# Patient Record
Sex: Male | Born: 1955 | Race: White | Hispanic: No | Smoking: Current every day smoker
Health system: Southern US, Community
[De-identification: ages and names within clinical notes are randomized; demographics above are authoritative.]

## PROBLEM LIST (undated history)

## (undated) DIAGNOSIS — Z973 Presence of spectacles and contact lenses: Secondary | ICD-10-CM

## (undated) DIAGNOSIS — I739 Peripheral vascular disease, unspecified: Secondary | ICD-10-CM

## (undated) DIAGNOSIS — R112 Nausea with vomiting, unspecified: Secondary | ICD-10-CM

## (undated) DIAGNOSIS — E785 Hyperlipidemia, unspecified: Secondary | ICD-10-CM

## (undated) DIAGNOSIS — I639 Cerebral infarction, unspecified: Secondary | ICD-10-CM

## (undated) DIAGNOSIS — J3089 Other allergic rhinitis: Secondary | ICD-10-CM

## (undated) DIAGNOSIS — I63239 Cerebral infarction due to unspecified occlusion or stenosis of unspecified carotid arteries: Secondary | ICD-10-CM

## (undated) DIAGNOSIS — N183 Chronic kidney disease, stage 3 unspecified: Secondary | ICD-10-CM

## (undated) DIAGNOSIS — F32A Depression, unspecified: Secondary | ICD-10-CM

## (undated) DIAGNOSIS — J410 Simple chronic bronchitis: Secondary | ICD-10-CM

## (undated) DIAGNOSIS — Z9889 Other specified postprocedural states: Secondary | ICD-10-CM

## (undated) DIAGNOSIS — N401 Enlarged prostate with lower urinary tract symptoms: Secondary | ICD-10-CM

## (undated) DIAGNOSIS — R972 Elevated prostate specific antigen [PSA]: Secondary | ICD-10-CM

## (undated) DIAGNOSIS — G629 Polyneuropathy, unspecified: Secondary | ICD-10-CM

## (undated) DIAGNOSIS — F419 Anxiety disorder, unspecified: Secondary | ICD-10-CM

## (undated) DIAGNOSIS — T4145XA Adverse effect of unspecified anesthetic, initial encounter: Secondary | ICD-10-CM

## (undated) DIAGNOSIS — Z87442 Personal history of urinary calculi: Secondary | ICD-10-CM

## (undated) DIAGNOSIS — M1990 Unspecified osteoarthritis, unspecified site: Secondary | ICD-10-CM

## (undated) DIAGNOSIS — N210 Calculus in bladder: Secondary | ICD-10-CM

## (undated) DIAGNOSIS — I10 Essential (primary) hypertension: Secondary | ICD-10-CM

## (undated) HISTORY — PX: MANDIBLE FRACTURE SURGERY: SHX706

## (undated) HISTORY — DX: Hyperlipidemia, unspecified: E78.5

## (undated) HISTORY — PX: CERVICAL FUSION: SHX112

## (undated) HISTORY — PX: COLONOSCOPY: SHX174

## (undated) HISTORY — PX: CYSTOSCOPY: SUR368

## (undated) HISTORY — PX: BACK SURGERY: SHX140

## (undated) HISTORY — PX: OTHER SURGICAL HISTORY: SHX169

## (undated) HISTORY — PX: COLONOSCOPY W/ POLYPECTOMY: SHX1380

---

## 1988-01-27 HISTORY — PX: KNEE ARTHROSCOPY: SUR90

## 1999-07-09 ENCOUNTER — Encounter: Payer: Self-pay | Admitting: Family Medicine

## 1999-07-09 ENCOUNTER — Encounter: Admission: RE | Admit: 1999-07-09 | Discharge: 1999-07-09 | Payer: Self-pay | Admitting: Oncology

## 1999-07-23 ENCOUNTER — Encounter (INDEPENDENT_AMBULATORY_CARE_PROVIDER_SITE_OTHER): Payer: Self-pay | Admitting: Specialist

## 1999-07-23 ENCOUNTER — Other Ambulatory Visit: Admission: RE | Admit: 1999-07-23 | Discharge: 1999-07-23 | Payer: Self-pay | Admitting: *Deleted

## 1999-08-04 ENCOUNTER — Ambulatory Visit (HOSPITAL_COMMUNITY): Admission: RE | Admit: 1999-08-04 | Discharge: 1999-08-04 | Payer: Self-pay | Admitting: Neurosurgery

## 1999-08-04 ENCOUNTER — Encounter: Payer: Self-pay | Admitting: Neurosurgery

## 1999-08-04 HISTORY — PX: LUMBAR LAMINECTOMY/DECOMPRESSION MICRODISCECTOMY: SHX5026

## 2001-03-04 ENCOUNTER — Encounter: Payer: Self-pay | Admitting: Emergency Medicine

## 2001-03-04 ENCOUNTER — Emergency Department (HOSPITAL_COMMUNITY): Admission: EM | Admit: 2001-03-04 | Discharge: 2001-03-04 | Payer: Self-pay | Admitting: *Deleted

## 2001-04-08 ENCOUNTER — Ambulatory Visit (HOSPITAL_BASED_OUTPATIENT_CLINIC_OR_DEPARTMENT_OTHER): Admission: RE | Admit: 2001-04-08 | Discharge: 2001-04-08 | Payer: Self-pay | Admitting: Urology

## 2001-04-08 HISTORY — PX: CYSTOSCOPY/URETEROSCOPY/HOLMIUM LASER/STENT PLACEMENT: SHX6546

## 2001-07-07 ENCOUNTER — Encounter: Admission: RE | Admit: 2001-07-07 | Discharge: 2001-07-07 | Payer: Self-pay | Admitting: *Deleted

## 2001-07-07 ENCOUNTER — Encounter: Payer: Self-pay | Admitting: Family Medicine

## 2001-07-08 ENCOUNTER — Ambulatory Visit (HOSPITAL_COMMUNITY): Admission: RE | Admit: 2001-07-08 | Discharge: 2001-07-08 | Payer: Self-pay | Admitting: Internal Medicine

## 2003-03-07 ENCOUNTER — Emergency Department (HOSPITAL_COMMUNITY): Admission: EM | Admit: 2003-03-07 | Discharge: 2003-03-07 | Payer: Self-pay | Admitting: Family Medicine

## 2003-06-04 ENCOUNTER — Emergency Department (HOSPITAL_COMMUNITY): Admission: EM | Admit: 2003-06-04 | Discharge: 2003-06-04 | Payer: Self-pay | Admitting: Family Medicine

## 2003-08-06 ENCOUNTER — Emergency Department (HOSPITAL_COMMUNITY): Admission: EM | Admit: 2003-08-06 | Discharge: 2003-08-06 | Payer: Self-pay | Admitting: Family Medicine

## 2003-08-07 ENCOUNTER — Ambulatory Visit (HOSPITAL_COMMUNITY): Admission: RE | Admit: 2003-08-07 | Discharge: 2003-08-07 | Payer: Self-pay | Admitting: Neurosurgery

## 2003-08-22 ENCOUNTER — Encounter: Admission: RE | Admit: 2003-08-22 | Discharge: 2003-08-22 | Payer: Self-pay | Admitting: Neurosurgery

## 2003-10-03 ENCOUNTER — Ambulatory Visit (HOSPITAL_COMMUNITY): Admission: RE | Admit: 2003-10-03 | Discharge: 2003-10-04 | Payer: Self-pay | Admitting: Neurosurgery

## 2003-10-23 HISTORY — PX: ANTERIOR CERVICAL DECOMP/DISCECTOMY FUSION: SHX1161

## 2003-11-14 ENCOUNTER — Encounter: Admission: RE | Admit: 2003-11-14 | Discharge: 2003-11-14 | Payer: Self-pay | Admitting: Neurosurgery

## 2003-12-14 ENCOUNTER — Emergency Department (HOSPITAL_COMMUNITY): Admission: EM | Admit: 2003-12-14 | Discharge: 2003-12-14 | Payer: Self-pay | Admitting: Family Medicine

## 2003-12-26 ENCOUNTER — Encounter: Admission: RE | Admit: 2003-12-26 | Discharge: 2003-12-26 | Payer: Self-pay | Admitting: Neurosurgery

## 2004-03-12 ENCOUNTER — Encounter: Admission: RE | Admit: 2004-03-12 | Discharge: 2004-03-12 | Payer: Self-pay | Admitting: Neurosurgery

## 2004-06-05 ENCOUNTER — Emergency Department (HOSPITAL_COMMUNITY): Admission: EM | Admit: 2004-06-05 | Discharge: 2004-06-05 | Payer: Self-pay | Admitting: Family Medicine

## 2004-07-08 ENCOUNTER — Emergency Department (HOSPITAL_COMMUNITY): Admission: EM | Admit: 2004-07-08 | Discharge: 2004-07-08 | Payer: Self-pay | Admitting: Family Medicine

## 2004-11-11 ENCOUNTER — Emergency Department (HOSPITAL_COMMUNITY): Admission: EM | Admit: 2004-11-11 | Discharge: 2004-11-11 | Payer: Self-pay | Admitting: Family Medicine

## 2005-06-23 ENCOUNTER — Emergency Department (HOSPITAL_COMMUNITY): Admission: EM | Admit: 2005-06-23 | Discharge: 2005-06-23 | Payer: Self-pay | Admitting: Family Medicine

## 2005-07-03 ENCOUNTER — Emergency Department (HOSPITAL_COMMUNITY): Admission: EM | Admit: 2005-07-03 | Discharge: 2005-07-03 | Payer: Self-pay | Admitting: Family Medicine

## 2005-10-09 ENCOUNTER — Emergency Department (HOSPITAL_COMMUNITY): Admission: EM | Admit: 2005-10-09 | Discharge: 2005-10-09 | Payer: Self-pay | Admitting: Family Medicine

## 2005-10-23 ENCOUNTER — Emergency Department (HOSPITAL_COMMUNITY): Admission: EM | Admit: 2005-10-23 | Discharge: 2005-10-23 | Payer: Self-pay | Admitting: Family Medicine

## 2006-03-24 ENCOUNTER — Emergency Department (HOSPITAL_COMMUNITY): Admission: EM | Admit: 2006-03-24 | Discharge: 2006-03-24 | Payer: Self-pay | Admitting: Family Medicine

## 2006-08-06 ENCOUNTER — Emergency Department (HOSPITAL_COMMUNITY): Admission: EM | Admit: 2006-08-06 | Discharge: 2006-08-06 | Payer: Self-pay | Admitting: Family Medicine

## 2007-01-27 DIAGNOSIS — I639 Cerebral infarction, unspecified: Secondary | ICD-10-CM

## 2007-01-27 HISTORY — DX: Cerebral infarction, unspecified: I63.9

## 2007-09-15 ENCOUNTER — Inpatient Hospital Stay (HOSPITAL_COMMUNITY): Admission: EM | Admit: 2007-09-15 | Discharge: 2007-09-17 | Payer: Self-pay | Admitting: Emergency Medicine

## 2007-09-15 DIAGNOSIS — E785 Hyperlipidemia, unspecified: Secondary | ICD-10-CM | POA: Insufficient documentation

## 2007-09-15 DIAGNOSIS — I6930 Unspecified sequelae of cerebral infarction: Secondary | ICD-10-CM

## 2007-09-15 DIAGNOSIS — I69959 Hemiplegia and hemiparesis following unspecified cerebrovascular disease affecting unspecified side: Secondary | ICD-10-CM | POA: Insufficient documentation

## 2007-09-15 HISTORY — DX: Unspecified sequelae of cerebral infarction: I69.30

## 2007-09-16 ENCOUNTER — Encounter (INDEPENDENT_AMBULATORY_CARE_PROVIDER_SITE_OTHER): Payer: Self-pay | Admitting: Emergency Medicine

## 2007-09-16 ENCOUNTER — Ambulatory Visit: Payer: Self-pay | Admitting: Internal Medicine

## 2007-09-17 ENCOUNTER — Encounter (INDEPENDENT_AMBULATORY_CARE_PROVIDER_SITE_OTHER): Payer: Self-pay | Admitting: Internal Medicine

## 2007-09-30 ENCOUNTER — Ambulatory Visit: Payer: Self-pay | Admitting: Internal Medicine

## 2007-09-30 DIAGNOSIS — F17200 Nicotine dependence, unspecified, uncomplicated: Secondary | ICD-10-CM | POA: Insufficient documentation

## 2007-09-30 DIAGNOSIS — K219 Gastro-esophageal reflux disease without esophagitis: Secondary | ICD-10-CM | POA: Insufficient documentation

## 2007-10-04 DIAGNOSIS — F411 Generalized anxiety disorder: Secondary | ICD-10-CM | POA: Insufficient documentation

## 2010-02-16 ENCOUNTER — Emergency Department (HOSPITAL_COMMUNITY)
Admission: EM | Admit: 2010-02-16 | Discharge: 2010-02-16 | Payer: Self-pay | Source: Home / Self Care | Admitting: Emergency Medicine

## 2010-02-18 LAB — URINALYSIS, ROUTINE W REFLEX MICROSCOPIC
Bilirubin Urine: NEGATIVE
Ketones, ur: 15 mg/dL — AB
Nitrite: NEGATIVE
Protein, ur: 100 mg/dL — AB
Specific Gravity, Urine: 1.015 (ref 1.005–1.030)
Urine Glucose, Fasting: NEGATIVE mg/dL
Urobilinogen, UA: 0.2 mg/dL (ref 0.0–1.0)
pH: 6.5 (ref 5.0–8.0)

## 2010-02-18 LAB — URINE MICROSCOPIC-ADD ON

## 2010-02-19 LAB — URINE CULTURE
Colony Count: >=100000
Culture  Setup Time: 201201221734

## 2010-06-10 NOTE — Discharge Summary (Signed)
NAMEHENSON, Roy Moore              ACCOUNT NO.:  [id number]   MEDICAL RECORD NO.:  [id number]          PATIENT TYPE:  INP   LOCATION:  3038                         FACILITY:  [facility]   PHYSICIAN:  Deanna Artis. Hickling, M.D.DATE OF BIRTH:  02/12/1955   DATE OF ADMISSION:  09/15/2007  DATE OF DISCHARGE:  09/17/2007                               DISCHARGE SUMMARY   FINAL DIAGNOSES:  1. 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 (434.11)  2. Remote left posterior cerebral artery infarction.   RISK FACTORS:  1. Bilateral carotid artery occlusions.  2. Tobacco abuse.  3. Elevated LDL, lower than normal HDL, elevated homocystine.   PROCEDURES:  1. CT scan of the brain.  2. MRI scan of the brain and carotid.  3. Cerebral angiogram.  4. A 2-D echocardiogram.   COMPLICATIONS:  None.   SUMMARY OF HOSPITALIZATION:  The patient is a 55 year old gentleman who  was admitted with sudden onset of left hemiparesis that was progressive  from a right facial droop to left-sided severe weakness, dysarthria, and  hemisensory.  NIH stroke scale was 12.  The patient was given two-thirds  tPA and sent to the Interventional Suite where he cleared quickly.  Findings at the angiogram showed an occluded right internal carotid  artery at the bulb with distal reconstitution of the cavernous segment  from the external carotid artery circulation, a small filling defect in  the right middle cerebral artery that is distal to the M1 segment, non-  flow restricting partial constitution via the right posterior cerebral  artery, occluded left internal carotid artery at the bulb with left  middle cerebral and anterior cerebral artery supplied from the left  posterior circulation.  Given that the patient clinically improved, the  remainder one-third dose of tPA was given.   His NIH stroke scale dropped to zero.   MRI of the brain showed  scattered watershed infarctions in the  distribution of right middle cerebral artery and an old left watershed  infarction in the watershed of the posterior cerebral artery and  posterior division of the middle cerebral artery.  MRA showed bilateral  occlusion of the internal carotid arteries at the skull base.   Cholesterol 160, total cholesterol 163, triglycerides 147, HDL low at  27, and LDL elevated at 107.  Serum homocystine was 19.2.  Hemoglobin  A1c was 5.7.  Chest x-ray:  No active disease.  Urinalysis:  11-20 white  blood cells, 0-2 red blood cells, and small leukocyte esterase.  The  patient does not have a dysuria and this was not treated.  Urine drug  screen positive for benzodiazepines and THC.   PHYSICAL EXAMINATION:  GENERAL:  Today, the patient feels well.  He has  no complaints.  VITAL SIGNS:  Blood pressure 119/78, resting pulse 80, respirations 18,  T-max 97.8, and oxygenation 98% on room air.  He is on a heart-healthy  diet and is out of bed.  LUNGS:  Clear.  HEART:  No murmurs.  Pulses normal.  ABDOMEN:  Soft.  Bowel  sounds normal.  EXTREMITIES:  Well-formed.  NEUROLOGIC:  Awake and alert without dysphagia.  Cranial Nerves:  Visual  fields full.  Extraocular movements full.  Symmetric facial strength,  midline tongue.  Motor examination:  Normal strength in all 4  extremities.  Normal sensation including primary and cortical.  His gait  is slightly broad-based but stable.  Deep tendon reflexes were  diminished.  The patient had bilateral flexor plantar responses.   The patient had a 2-D echocardiogram which shows normal left ventricular  systolic function and an ejection fraction of 60-65%, mild increased  thickness of left ventricular wall, and no other source of emboli.   The patient is discharged home in improved condition.  We discussed the  absolute need for cessation of smoking.  He has been given the Nicoderm  patch but since no one around him smokes, he  was advised to use his  family's resources to help him quit and to stay away from other smokers.  His major barrier to care right now is that he has no insurance and no  visible leads to support.  However, given that he smokes at least 1  packet of cigarettes per day and THC was found in his urine, it appears  that he is finding ways to afford other things that where he to change,  his parents would provide money for other medications.  The patient also  need a low dose statin and I have started him on simvastatin 20 mg  daily.  He needs Plavix 75 mg for 2 months and aspirin 81 mg that will  continue and perhaps increased to 325 and Plavix was discontinued.  Because of elevated homocystine, he needs to start Metanx.   In the hierarchy of treatments, I have told him that it is absolute  necessary to stop smoking, to take his Plavix, and to take simvastatin.  There is less support for the Metanx and the smoking cessation could be  accomplished without the Nicoderm patch.  I spent over half an hour  discussing on the patient's clinical situation, and the need for  secondary stroke prevention.  I filled out prescriptions for 3-day  supply of these medications and through the [facility] and for  31-day supply with 5 refills for them.  I also filled out a 3-day  prescription for alprazolam 0.5 mg t.i.d. #9 and for amitriptyline 50 mg  #3, also no refills.  He will obtain these other two medications through  his primary physician, Dr. Milus Glazier at the beginning of the week.  He  should followup with Dr. Pearlean Brownie in 2-3 months' time.  He may be a  candidate for the IVUS study.  I will have the staff contact him next  week.      Deanna Artis. Sharene Skeans, M.D.  Electronically Signed     WHH/MEDQ  D:  09/17/2007  T:  09/18/2007  Job:  [id number]   cc:   Elvina Sidle, M.D.

## 2010-06-10 NOTE — H&P (Signed)
Roy Moore, Roy Moore              ACCOUNT NO.:  [id number]   MEDICAL RECORD NO.:  [id number]          PATIENT TYPE:  INP   LOCATION:  3110                         FACILITY:  [facility]   PHYSICIAN:  Gustavus Messing. Orlin Hilding, M.D.DATE OF BIRTH:  1955/03/09   DATE OF ADMISSION:  09/15/2007  DATE OF DISCHARGE:                              HISTORY & PHYSICAL   PRIMARY CARE PHYSICIAN:  Elvina Sidle, MD   Onset of symptoms was 12:50 p.m., Neurology was called at 1:25 p.m., and  initial neurologic exam at 2 p.m.  CT results known at about 2:10 p.m.   CHIEF COMPLAINT:  Left-sided weakness, confusion, and vision loss.   HISTORY OF PRESENT ILLNESS:  Roy Moore is a 55 year old right-handed  white male with a history of remote alcohol abuse and seizures in the  past 12 years ago or more, otherwise been healthy.  He had been working  outside, came inside, sat down, talking to his wife was normal, but then  just in front of his wife at about 12:50 p.m., he became dysarthric and  was acting confused, had a right facial droop.  She called up 911 and he  was transported as a potential code stroke.   REVIEW OF SYSTEMS:  Really not obtainable from the patient at this time  as he is confused.   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  He does not have any clinical history of stroke, but his CT that is done  today does show that he has had an old left occipital stroke.   CURRENT MEDICATIONS:  1. Amitriptyline nightly.  2. Xanax 0.5 mg nightly.  I do not know the dose of the amitriptyline.   ALLERGIES:  No known drug allergies.   SOCIAL HISTORY:  He is married, has one son, works odd jobs apparently.  He does use tobacco.  He has a remote history of alcohol abuse, no  illicit drugs known.   FAMILY HISTORY:  Noncontributory.   OBJECTIVE:  On exam, blood pressure is 172/97.  He was somewhat  tachycardiac in the low 100s, 87.5 kg.  He has poor dentition, otherwise  head is normocephalic and atraumatic.  He was alert.  He did answer 2/2  questions correctly and followed 2/2 commands correctly.  He had full  gaze, although it was difficult to get him to look to the left, but was  able to get excursions across.  He had a dense left homonymous  hemianopsia on visual field testing.  He definitely had a left facial  droop.  He had a level 4 drift on the left with no movement of left  upper extremity, no drift on the right upper extremity.  He had a level  1 drift in the left lower extremity, no drift in the right lower  extremity.  There was no ataxia demonstrated.  He had absolute loss of  sensation on the left side to pinprick.  He did not appear to have  aphasia, but did have dysarthria.  There was significant  neglect of the  left side, although because of the dense homonymous hemianopsia and  dense sensory loss, could not demonstrate it in that fashion, but he  definitely would not attend well to the left.  NIH score was 13. There  was some fluctuating weakness in the left upper extremity.  CT of the  brain did not show anything definitely acute, but there was a question  of a hyperdense MCA sign on the right.  I also questioned some  effacement of the sulci in the right occipital region, which might  indicate early changes of stroke.   INR was 0.9, platelets are 259.  CBG was in the 130 range.  No  contraindications to TPA.   ASSESSMENT:  He appears to have an acute right middle cerebral artery  and/or posterior cerebral artery infarct in progress with signs of  cortical involvement in a large vessel distribution.  He arrived within  1-1/2 hours of the onset.   PLAN:  We will give two-thirds IV TPA and bring to angio to see if there  is anything amenable to clot extraction. He will then be admitted to the  ICU for stroke work up and further treatment as needed.       Catherine A. Orlin Hilding, M.D.  Electronically Signed     CAW/MEDQ  D:  09/15/2007  T:  09/16/2007  Job:  [id number]

## 2010-06-13 NOTE — Op Note (Signed)
[facility]  Patient:    Roy Moore, Roy Moore Visit Number: [id number] MRN: [id number]          Service Type: EMS Location: [facility] Attending Physician:  Hanley Seamen Dictated by:   Bertram Millard. Dahlstedt, M.D. Proc. Date: 04/08/01 Admit Date:  03/04/2001 Discharge Date: 03/04/2001   CC:         Elvina Sidle, M.D.   Operative Report  PREOPERATIVE DIAGNOSIS:  Right distal ureteral calculus with left midureteral calculus.  POSTOPERATIVE DIAGNOSES: 1. No evidence of right ureteral calculus. 2. Moderate-sized left ureteral calculus.  PROCEDURES: 1. Cystoscopy. 2. Bilateral retrograde ureteropyelograms. 3. Right ureteroscopy. 4. Left ureteroscopy with holmium laser lithotripsy of ureteral stone. 5. Double J stent placement.  SURGEON:  Bertram Millard. Dahlstedt, M.D.  ANESTHESIA:  General LMA.  COMPLICATIONS:  None.  BRIEF HISTORY:  A 55 year old male who initially presented to my office March 04, 2001.  He had bilateral hydronephrosis diagnosed in the emergency room at [facility].  He was found to have a distal ureteral calculus that was of acute onset.  He had a mid-left ureteral calculus.  He had left greater than right hydronephrosis.  The patient had an IVP on March 15, 2001, which revealed a distal ureteral stone with minimal hydronephrosis.  There was a delayed left pyelogram with hydronephrosis and no obvious stone seen, but the patient did not come in for follow-up.  It was recommended that he undergo bilateral retrograde pyelograms, possible right stone extraction, possible left ureteroscopy and holmium laser lithotripsy.  Risks and complications of the procedure were discussed with the patient and his wife.  These included but were not limited to infection, perforation of ureter, bleeding, anesthetic complications.  They understand these and desire to proceed.  DESCRIPTION OF PROCEDURE:  The patient was administered a general  anesthetic using the LMA.  He was placed in the dorsal lithotomy position.  Genitalia and perineum were prepped and draped.  A 25 French panendoscope was placed in the patients bladder.  The bladder and prostate were found to be normal. Bilateral retrograde ureteropyelograms were performed.  On the right, there was no evidence of obstruction or filling defects.  On the left, there was a midureteral filling defect with proximal hydronephrosis.  The 6 French short ureteroscope was advanced through the urethra and into the right ureteral orifice.  The ureter was found to be normal without evident stone.  On the left, the distal ureter was normal.  Midway just above the pelvic brim, the stone was encountered.  A guidewire was advanced past the stone.  The ureteroscope was then advanced into the ureter and the 300 micron holmium laser fiber was used to shatter the stone at a power of 5 watts.  Excellent fragmentation was seen.  The ureter was inspected proximal to the previously-mentinoed stone.  No further stones were seen in the proximal ureter or in the renal pelvis.  Inspection of the site of the stone revealed some narrowing of the ureter with some bullous mucosa; however, no other stones were seen.  No evidence of perforation was noted.  The ureteroscope was removed.  A 6 French x 26 cm Lubriflex double J stent was advanced over the guidewire, and fluoroscopic and cystoscopic guidance showed it to be in adequate position.  At this point the bladder was drained.  A string was left on the end of the stent, brought through the end of the penis, and taped.  The scope was removed, and the patients  procedure was terminated.  He tolerated the procedure well.  He was awakened and taken to the PACU in stable condition. Dictated by:   Bertram Millard. Dahlstedt, M.D. Attending Physician:  Hanley Seamen DD:  04/08/01 TD:  04/11/01 Job: [id number]

## 2010-06-13 NOTE — Op Note (Signed)
[place]  Patient:    Roy Moore, Roy Moore                       MRN: [id number] Proc. Date: 08/04/99 Attending:  Julio Sicks, M.D.                           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  PROCEDURE:  Left L3-4 laminotomy and microdiskectomy.  SURGEON:  Julio Sicks, M.D.  ASSISTANT:  Donzetta Sprung. Roney Jaffe., M.D.  ANESTHESIA:  General endotracheal.  INDICATIONS:  Mr. Roy Moore is a 55 year old male with a history of back and severe left lower extremity pain.  There is some weakness with a left-sided L3 versus L4 radiculopathy.  The patient has failed conservative measures.  MRI scan demonstrates a left L3-4 disk herniation with a superior fragment causing encroachment at the left L3 and L4 nerve roots.  The patient also has a broad-based disk herniation at L4-5, which appears to be asymptomatic.  The patient has been counseled as to his options.  He has decided to proceed with a left-sided L3-4 laminotomy and microdiskectomy for hopeful improvement of his symptoms.  DESCRIPTION OF PROCEDURE:  The patient was taken to the operating room and placed on the table in the supine position.  After an adequate level of anesthesia achieved, patient positioned prone onto the Wilson frame, appropriately padded for operation of the lumbar region, shaved, prepped and draped sterilely.  A 10 blade used to make a linear incision over the L3-4 interspace.  This was carried down sharply in the midline.  A subperiosteal dissection was performed on the left side, exposing the laminae and facets at L3 and L4.  Deep self-retaining retractor was placed, x-ray was taken, level was confirmed.  A laminotomy was then performed using Kerrison rongeurs and the high-speed drill, _____ of the inferior one-half of the lamina of L3,  the medial one-third of the L3-4 facet joint, and superior rim of the L4 lamina. Ligamentum flavum was then elevated and resected in piecemeal fashion using Kerrison rongeurs, freeing up the thecal sac and exiting L4 nerve root, which was identified.  The microscope was brought into the field and used throughout the remainder of the diskectomy.  Microscope was used for microdissection of the thecal sac and underlying disk herniation.  After the venous plexus was coagulated and cut, thecal sac and L4 nerve root were mobilized and tracked toward the midline.  A disk herniation at the level of the disk space was encountered.  This was then incised with a 15 blade in rectangular fashion.  A wide disk space clear-out was then achieved using pituitary rongeurs, upward-angled pituitary rongeurs, and Epstein curettes.  After an aggressive diskectomy performed, the remainder of the canal was explored.  There was a moderately large superior free fragment coursing beneath the exiting left-sided L3 nerve root.  This was dissected free and resected completely. At this point, there was no evidence of any further compression of the thecal sac or nerve roots.  There was no evidence of injury to the thecal sac or nerve roots.  The wound was then copiously irrigated with antibiotic solution. Gelfoam was placed on top for hemostasis, which was found to be good. Microscope and retraction system were removed.  Hemostasis of the musculature was achieved with the  electrocautery.  The wound was then closed in layers with Vicryl sutures.  Staples were applied to the surface.  There were no apparent complications.  The patient tolerated the procedure well, and he returned to the recovery room postoperatively. DD:  08/04/99 TD:  08/04/99 Job: [id number]

## 2010-06-13 NOTE — Op Note (Signed)
NAME:  Roy Moore                        ACCOUNT NO.:  [id number]   MEDICAL RECORD NO.:  [id number]                   PATIENT TYPE:  OIB   LOCATION:  3172                                 FACILITY:  [facility]   PHYSICIAN:  Kathaleen Maser. Moore, M.D.                 DATE OF BIRTH:  03/02/1955   DATE OF PROCEDURE:  10/03/2003  DATE OF DISCHARGE:                                 OPERATIVE REPORT   HISTORY:  Mr. Roy Moore is a 55 year old male who was injured in a work related  accident which resulted in neck and back pain.  The patient has radiation to  his right upper extremity and right lower extremity.  Work-up has  demonstrated evidence of a very significant right-sided C5-6 disc herniation  first in the right side of C6 nerve root.  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  We discussed the options of  operative management including the possibility of undergoing a C3-4, C4-5  and C5-6 anterior cervical diskectomy and fusion, allograft anterior plating  for improvement of symptoms.  We discussed the risks and benefits involving  surgery including, but not limited to risks of anesthesia, bleeding,  infection, CSF leak, nerve root injury spinal cord injury, fusion failure,  continued pain and no benefit.  The patient has been given option to ask  questions and appears to understand.  He wishes to proceed with surgery.   DESCRIPTION OF PROCEDURE:  The patient taken to the operating room and  placed on the table in supine position.  After adequate level of anesthesia  achieved, the patient positioned supine with neck slightly extended and held  by traction.  The anterior cervical region was prepped and draped sterilely.  We made a linear skin incision overlying the C4-5 level.  This carried down  sharply to the platysma.  The platysma was then divided vertically and  dissection  proceeded on the medial border of the cerebral vessel and carotid  sheath.  Trachea and esophagus immobilized and retracted toward the left.  Prevertebral  fascia stripped off the anterior spinal column.  The longus  colli muscles elevated bilaterally using electrocautery.  Deep self  retractors were placed.  Intraoperative fluoroscopy was used and the C3-4,  C4-5 and C5-6 levels were confirmed.  Discectomy was then performed at all  three levels utilizing pituitary rongeurs, Kerrison rongeurs, Karlin  curettes and the high speed drill.  All elements of the discs were removed  antral to the posterior annulus.  Starting first at C3-4 microscope was  brought into the field and used throughout the remainder of discectomy and  remaining aspects of the annulus.  Osteophytes were removed down to the  level of the posterior longitudinal ligament  using the high speed drill.  The posterior longitudinal ligament was then elevated and dissected  using  Kerrison rongeurs for wide central decompression then performed by  undercutting the bodies of C3 and C4 using Kerrison rongeurs.  Decompression  then presented at each neural foramen and wide anterior foraminotomy was  performed along the course of exiting L4 nerve roots.  At this point, a very  thorough decompression had been achieved.  The C4-5 and C5-6 were then  approached in similar manner.  Findings were similar.  At the C4-5 there was  a moderately large central disc herniation and on the right at C5-6, there  was a rightward foraminal disc herniation causing marked right-sided C6  nerve root compression.  After an aggression had been achieved at altering  levels, wound was then irrigated with antibiotic solution.  Gelfoam was  placed topically for hemostasis.  A 7 mm patellar wedge Allograft was then  packed into place at the C3-4, C4-5 and C5-6 levels.  A 60 mm anterior  cervical plate was then placed over the C3, C4, C5 and C6 levels.  This  then  attached under fluoroscopic guidance using 14 mm __________ screws two each  at all four levels.  Final tightening was achieved at all levels.  There was  good purchase of bone.  Locking screws were engaged at all four levels.  Wounds then irrigated with antibiotic solution.  Hemostasis achieved with  bipolar electrocautery.  Final images revealed good  incision of the bone grafts hardware, proper adequate level of normalized  spine.  The wound is then closed in typical fashion.  Steri-Strips and  sterile dressing were applied. There were no apparent complications.  The  patient tolerated the procedure well and he returned to the recovery room  postoperatively.                                               Roy Moore, M.D.    HAP/MEDQ  D:  10/03/2003  T:  10/03/2003  Job:  [id number]

## 2011-03-19 ENCOUNTER — Other Ambulatory Visit: Payer: Self-pay | Admitting: Family Medicine

## 2011-03-23 ENCOUNTER — Telehealth: Payer: Self-pay

## 2011-03-24 ENCOUNTER — Encounter: Payer: Self-pay | Admitting: Family Medicine

## 2011-03-24 ENCOUNTER — Ambulatory Visit (INDEPENDENT_AMBULATORY_CARE_PROVIDER_SITE_OTHER): Payer: Medicare Other | Admitting: Family Medicine

## 2011-03-24 DIAGNOSIS — G4700 Insomnia, unspecified: Secondary | ICD-10-CM

## 2011-03-24 DIAGNOSIS — I69359 Hemiplegia and hemiparesis following cerebral infarction affecting unspecified side: Secondary | ICD-10-CM

## 2011-03-24 DIAGNOSIS — E785 Hyperlipidemia, unspecified: Secondary | ICD-10-CM

## 2011-03-24 DIAGNOSIS — F419 Anxiety disorder, unspecified: Secondary | ICD-10-CM

## 2011-03-24 DIAGNOSIS — J329 Chronic sinusitis, unspecified: Secondary | ICD-10-CM

## 2011-03-24 DIAGNOSIS — F411 Generalized anxiety disorder: Secondary | ICD-10-CM

## 2011-03-24 DIAGNOSIS — B079 Viral wart, unspecified: Secondary | ICD-10-CM

## 2011-03-24 DIAGNOSIS — E789 Disorder of lipoprotein metabolism, unspecified: Secondary | ICD-10-CM

## 2011-03-24 MED ORDER — AMITRIPTYLINE HCL 50 MG PO TABS: 50.0000 mg | ORAL_TABLET | Freq: Every day | ORAL | Status: DC

## 2011-03-24 MED ORDER — ALPRAZOLAM 1 MG PO TABS: 1.0000 mg | ORAL_TABLET | Freq: Three times a day (TID) | ORAL | Status: DC | PRN

## 2011-03-24 MED ORDER — LOVASTATIN 20 MG PO TABS: 20.0000 mg | ORAL_TABLET | Freq: Every day | ORAL | Status: DC

## 2011-03-24 MED ORDER — AMOXICILLIN 875 MG PO TABS: 875.0000 mg | ORAL_TABLET | Freq: Two times a day (BID) | ORAL | Status: AC

## 2011-03-24 NOTE — Progress Notes (Signed)
55-year-old disabled man from stroke several years ago comes in for refills on his medication, sinus congestion, painful area right heel. I debrided the right heel once before but the area in question has come back. He's had several weeks of sinus congestion and ear pain.  His long history of anxiety treated with Xanax successfully. Also he has chronic cholesterol problems which has been treated well with Mevacor and his cholesterol was last checked last August. Finally he wants a handicap sticker filled out today.  Objective: No acute distress alert and cooperative  HEENT: Serous otitis changes both ears, nose shows bilateral nasal swelling and redness, oropharynx shows terrible dentition with almost no teeth. (Patient choked on food recently and his wife did Heimlich maneuver)  Chest: Expiratory wheezes bilaterally  Heart: Regular no murmur  Skin: Right heel shows 3 mm wartlike structure which was debrided for 10 minutes and then treated with liquid nitrogen  Neuro: No facial asymmetry cranial nerves III through XII intact, right hand grip is improving with good grip which is only mildly less than left, gait normal  Assessment: Wart, sinusitis, anxiety, hyperlipidemia, status post CVA  Plan: Refill meds, recheck he'll when necessary, amoxicillin, handicap sticker filled out  

## 2011-10-06 ENCOUNTER — Other Ambulatory Visit: Payer: Self-pay | Admitting: Family Medicine

## 2011-10-07 ENCOUNTER — Other Ambulatory Visit: Payer: Self-pay | Admitting: *Deleted

## 2011-11-18 ENCOUNTER — Ambulatory Visit (INDEPENDENT_AMBULATORY_CARE_PROVIDER_SITE_OTHER): Payer: Medicare Other | Admitting: Family Medicine

## 2011-11-18 VITALS — BP 158/72 | HR 82 | Temp 98.7°F | Resp 16 | Ht 69.0 in | Wt 172.4 lb

## 2011-11-18 DIAGNOSIS — G4700 Insomnia, unspecified: Secondary | ICD-10-CM

## 2011-11-18 DIAGNOSIS — F419 Anxiety disorder, unspecified: Secondary | ICD-10-CM

## 2011-11-18 DIAGNOSIS — F411 Generalized anxiety disorder: Secondary | ICD-10-CM

## 2011-11-18 DIAGNOSIS — E785 Hyperlipidemia, unspecified: Secondary | ICD-10-CM

## 2011-11-18 DIAGNOSIS — J329 Chronic sinusitis, unspecified: Secondary | ICD-10-CM

## 2011-11-18 MED ORDER — AMITRIPTYLINE HCL 50 MG PO TABS: 50.0000 mg | ORAL_TABLET | Freq: Every day | ORAL | Status: DC

## 2011-11-18 MED ORDER — AMOXICILLIN 875 MG PO TABS: 875.0000 mg | ORAL_TABLET | Freq: Two times a day (BID) | ORAL | Status: DC

## 2011-11-18 MED ORDER — ALPRAZOLAM 1 MG PO TABS: 1.0000 mg | ORAL_TABLET | Freq: Three times a day (TID) | ORAL | Status: DC | PRN

## 2011-11-18 MED ORDER — LOVASTATIN 20 MG PO TABS: 20.0000 mg | ORAL_TABLET | Freq: Every day | ORAL | Status: DC

## 2011-11-18 NOTE — Addendum Note (Signed)
Addended by: Elvina Sidle on: 11/18/2011 06:42 PM   Modules accepted: Orders

## 2011-11-18 NOTE — Progress Notes (Signed)
56 year old disabled man from stroke several years ago comes in for refills on his medication, sinus congestion, painful area right heel. I debrided the right heel once before but the area in question has come back. He's had several weeks of sinus congestion and ear pain.  His long history of anxiety treated with Xanax successfully. Also he has chronic cholesterol problems which has been treated well with Mevacor and his cholesterol was last checked last August. Finally he wants a handicap sticker filled out today.  Objective: No acute distress alert and cooperative  HEENT: Serous otitis changes both ears, nose shows bilateral nasal swelling and redness, oropharynx shows terrible dentition with almost no teeth. (Patient choked on food recently and his wife did Heimlich maneuver)  Chest: Expiratory wheezes bilaterally  Heart: Regular no murmur  Skin: Right heel shows 3 mm wartlike structure which was debrided for 10 minutes and then treated with liquid nitrogen  Neuro: No facial asymmetry cranial nerves III through XII intact, right hand grip is improving with good grip which is only mildly less than left, gait normal  Assessment: Wart, sinusitis, anxiety, hyperlipidemia, status post CVA  Plan: Refill meds, recheck he'll when necessary, amoxicillin, handicap sticker filled out

## 2011-11-18 NOTE — Patient Instructions (Addendum)

## 2011-11-19 LAB — LIPID PANEL
Cholesterol: 175 mg/dL (ref 0–200)
HDL: 41 mg/dL (ref >39)
LDL Cholesterol: 109 mg/dL — ABNORMAL HIGH (ref 0–99)
Total CHOL/HDL Ratio: 4.3 Ratio
Triglycerides: 127 mg/dL (ref <150)
VLDL: 25 mg/dL (ref 0–40)

## 2011-11-19 LAB — COMPREHENSIVE METABOLIC PANEL
ALT: <8 U/L (ref 0–53)
AST: 14 U/L (ref 0–37)
Albumin: 4.5 g/dL (ref 3.5–5.2)
Alkaline Phosphatase: 47 U/L (ref 39–117)
BUN: 17 mg/dL (ref 6–23)
CO2: 29 mEq/L (ref 19–32)
Calcium: 9.4 mg/dL (ref 8.4–10.5)
Chloride: 106 mEq/L (ref 96–112)
Creat: 1.45 mg/dL — ABNORMAL HIGH (ref 0.50–1.35)
Glucose, Bld: 88 mg/dL (ref 70–99)
Potassium: 4 mEq/L (ref 3.5–5.3)
Sodium: 142 mEq/L (ref 135–145)
Total Bilirubin: 0.4 mg/dL (ref 0.3–1.2)
Total Protein: 6.9 g/dL (ref 6.0–8.3)

## 2011-11-23 ENCOUNTER — Telehealth: Payer: Self-pay

## 2011-11-23 NOTE — Telephone Encounter (Signed)
Our records indicate that it says to take Xanax 1 tab tid. Perhaps if it is incorrect they should contact the pharmacy.

## 2011-11-23 NOTE — Telephone Encounter (Signed)
Patent's wife called stating that directions for amitriptyline and xanax have been reversed. States on xanax to only take 1/day and that is normally the directions for the amitriptyline.

## 2011-11-24 NOTE — Telephone Encounter (Signed)
Called patients wife to advise, she is going to contact the pharmacy.

## 2011-11-26 ENCOUNTER — Telehealth: Payer: Self-pay | Admitting: Radiology

## 2011-11-26 DIAGNOSIS — F419 Anxiety disorder, unspecified: Secondary | ICD-10-CM

## 2011-11-26 MED ORDER — ALPRAZOLAM 1 MG PO TABS: 1.0000 mg | ORAL_TABLET | Freq: Three times a day (TID) | ORAL | Status: DC | PRN

## 2011-11-26 NOTE — Telephone Encounter (Signed)
Patients wife called to advise the Rx was incorrect, the Rx should state tid, but states tid for sleep, have corrected this and called pharmacy. Have just advised tid, took the sleep out of sig/ spoke to wife June,  [phone] called

## 2011-11-26 NOTE — Telephone Encounter (Signed)
Ok to refill xanax x 5 months

## 2011-11-26 NOTE — Telephone Encounter (Signed)
Called patients wife again and she is advised., she does request the Alprazolam to be renewed for 6 months, there was 1 renewal on this, she wants you to change to renewals to 5. Please advise.

## 2011-11-27 MED ORDER — ALPRAZOLAM 1 MG PO TABS: 1.0000 mg | ORAL_TABLET | Freq: Three times a day (TID) | ORAL | Status: DC | PRN

## 2011-11-27 NOTE — Telephone Encounter (Signed)
Thanks, have done this, called pharmacy, called pt to advise.

## 2012-05-10 ENCOUNTER — Telehealth: Payer: Self-pay

## 2012-05-10 DIAGNOSIS — F419 Anxiety disorder, unspecified: Secondary | ICD-10-CM

## 2012-05-10 NOTE — Telephone Encounter (Signed)
Pharm requesting RF of xanax 1 mg. Dr L, do you want to RF x 1 w/note that pt needs OV for add'l RFs, or do you want to deny this d/t pt being due for OV?

## 2012-05-12 MED ORDER — ALPRAZOLAM 1 MG PO TABS: 1.0000 mg | ORAL_TABLET | Freq: Three times a day (TID) | ORAL | Status: DC | PRN

## 2012-05-12 NOTE — Telephone Encounter (Signed)
Called patient to advise this is sent in office visit needed for further.

## 2012-05-12 NOTE — Telephone Encounter (Signed)
Call in one refill.  Please have patient return in next month.  No further refills after this without OV.

## 2012-06-01 ENCOUNTER — Ambulatory Visit (INDEPENDENT_AMBULATORY_CARE_PROVIDER_SITE_OTHER): Payer: Medicare Other | Admitting: Family Medicine

## 2012-06-01 VITALS — BP 122/64 | HR 91 | Temp 98.2°F | Resp 16 | Ht 69.0 in | Wt 175.0 lb

## 2012-06-01 DIAGNOSIS — E785 Hyperlipidemia, unspecified: Secondary | ICD-10-CM

## 2012-06-01 DIAGNOSIS — F419 Anxiety disorder, unspecified: Secondary | ICD-10-CM

## 2012-06-01 DIAGNOSIS — G4700 Insomnia, unspecified: Secondary | ICD-10-CM

## 2012-06-01 DIAGNOSIS — J329 Chronic sinusitis, unspecified: Secondary | ICD-10-CM

## 2012-06-01 LAB — COMPREHENSIVE METABOLIC PANEL
ALT: 13 U/L (ref 0–53)
AST: 19 U/L (ref 0–37)
Albumin: 4.7 g/dL (ref 3.5–5.2)
Alkaline Phosphatase: 45 U/L (ref 39–117)
BUN: 17 mg/dL (ref 6–23)
CO2: 29 mEq/L (ref 19–32)
Calcium: 9.4 mg/dL (ref 8.4–10.5)
Chloride: 103 mEq/L (ref 96–112)
Creat: 1.06 mg/dL (ref 0.50–1.35)
Glucose, Bld: 75 mg/dL (ref 70–99)
Potassium: 3.9 mEq/L (ref 3.5–5.3)
Sodium: 140 mEq/L (ref 135–145)
Total Bilirubin: 0.5 mg/dL (ref 0.3–1.2)
Total Protein: 7 g/dL (ref 6.0–8.3)

## 2012-06-01 LAB — LIPID PANEL
Cholesterol: 169 mg/dL (ref 0–200)
HDL: 48 mg/dL (ref >39)
LDL Cholesterol: 105 mg/dL — ABNORMAL HIGH (ref 0–99)
Total CHOL/HDL Ratio: 3.5 Ratio
Triglycerides: 80 mg/dL (ref <150)
VLDL: 16 mg/dL (ref 0–40)

## 2012-06-01 MED ORDER — LOVASTATIN 20 MG PO TABS: 20.0000 mg | ORAL_TABLET | Freq: Every day | ORAL | Status: DC

## 2012-06-01 MED ORDER — AMITRIPTYLINE HCL 50 MG PO TABS: 50.0000 mg | ORAL_TABLET | Freq: Every day | ORAL | Status: DC

## 2012-06-01 MED ORDER — AMOXICILLIN 875 MG PO TABS: 875.0000 mg | ORAL_TABLET | Freq: Two times a day (BID) | ORAL | Status: DC

## 2012-06-01 MED ORDER — ALPRAZOLAM 1 MG PO TABS: 1.0000 mg | ORAL_TABLET | Freq: Three times a day (TID) | ORAL | Status: DC | PRN

## 2012-06-01 NOTE — Progress Notes (Signed)
57 yo man with recurrent sinus problems, mild low back pain, small subcutaneous nodule, and refill on alprazolam  He still has sensation of water behind right ear   Objective: NAD Skin:  Small 3 mm epidermal nodule lower right parasternal freely mobile, nontender sebaceous type cyst Neck:  Right carotid bruit, good carotid pulses bilaterally HEENT: bilateral SOM, terrible dentition last few teeth Chest:  Clear Heart:  Reg, I/VI systolic murmur Ext: no edema  Assessment:  Chronic sinusitis related to poor dentition (urged to see dentist),  Continued smoking (urged to quit), poor dentition, epidermal cyst, chronic anxiety  Plan:

## 2012-06-01 NOTE — Patient Instructions (Addendum)

## 2012-12-30 ENCOUNTER — Other Ambulatory Visit: Payer: Self-pay | Admitting: Family Medicine

## 2013-01-02 ENCOUNTER — Telehealth: Payer: Self-pay | Admitting: Radiology

## 2013-01-02 NOTE — Telephone Encounter (Signed)
Patient requesting refill on Alprazolam 1mg ./ this is already done.

## 2013-01-03 ENCOUNTER — Telehealth: Payer: Self-pay

## 2013-01-07 ENCOUNTER — Other Ambulatory Visit: Payer: Self-pay | Admitting: Family Medicine

## 2013-01-07 NOTE — Telephone Encounter (Signed)
Pt states his pharmacy still does not have his rx for xanax. Please advise

## 2013-01-09 NOTE — Telephone Encounter (Signed)
Called in for him, called him to advise, it appears to have been sent in

## 2013-02-14 ENCOUNTER — Ambulatory Visit (INDEPENDENT_AMBULATORY_CARE_PROVIDER_SITE_OTHER): Payer: Medicare Other | Admitting: Family Medicine

## 2013-02-14 VITALS — BP 120/72 | HR 68 | Temp 97.9°F | Resp 16 | Ht 71.0 in | Wt 189.2 lb

## 2013-02-14 DIAGNOSIS — G4700 Insomnia, unspecified: Secondary | ICD-10-CM

## 2013-02-14 DIAGNOSIS — E785 Hyperlipidemia, unspecified: Secondary | ICD-10-CM

## 2013-02-14 DIAGNOSIS — B070 Plantar wart: Secondary | ICD-10-CM

## 2013-02-14 DIAGNOSIS — J329 Chronic sinusitis, unspecified: Secondary | ICD-10-CM

## 2013-02-14 MED ORDER — AMITRIPTYLINE HCL 50 MG PO TABS: 50.0000 mg | ORAL_TABLET | Freq: Every day | ORAL | Status: DC

## 2013-02-14 MED ORDER — ALPRAZOLAM 1 MG PO TABS: 1.0000 mg | ORAL_TABLET | Freq: Three times a day (TID) | ORAL | Status: DC | PRN

## 2013-02-14 MED ORDER — LOVASTATIN 20 MG PO TABS: 20.0000 mg | ORAL_TABLET | Freq: Every day | ORAL | Status: DC

## 2013-02-14 MED ORDER — AMOXICILLIN 875 MG PO TABS: 875.0000 mg | ORAL_TABLET | Freq: Two times a day (BID) | ORAL | Status: DC

## 2013-02-14 NOTE — Progress Notes (Signed)
Patient ID: Roy Moore MRN: [id number], DOB: 11/07/1955, 58 y.o. Date of Encounter: 02/14/2013, 6:02 PM  Primary Physician: [name], MD  Chief Complaint:  Chief Complaint  Patient presents with  . Facial Pain    ear pain, pt. feels fluid  . Plantar Warts    rt. foot  . Medication Refill    HPI: 58 y.o. year old male presents with 14 day history of nasal congestion, post nasal drip, sore throat, sinus pressure, and cough. Afebrile. No chills. Nasal congestion thick and green/yellow. Sinus pressure is the worst symptom. Cough is productive secondary to post nasal drip and not associated with time of day. Ears feel full, leading to sensation of muffled hearing. Has tried OTC cold preps without success. No GI complaints. Appetite too good  No recent antibiotics, recent travels, or sick contacts   No leg trauma, sedentary periods, h/o cancer, or tobacco use.  No past medical history on file.   Home Meds: Prior to Admission medications   Medication Sig Start Date End Date Taking? Authorizing Provider  ALPRAZolam [name]) 1 MG tablet Take 1 tablet (1 mg total) by mouth 3 (three) times daily as needed for anxiety. 02/14/13  Yes [name], MD  amitriptyline (ELAVIL) 50 MG tablet Take 1 tablet (50 mg total) by mouth at bedtime. 02/14/13  Yes [name], MD  lovastatin (MEVACOR) 20 MG tablet Take 1 tablet (20 mg total) by mouth at bedtime. 06/01/12  Yes [name], MD  amoxicillin (AMOXIL) 875 MG tablet Take 1 tablet (875 mg total) by mouth 2 (two) times daily. 02/14/13   [name], MD    Allergies: No Known Allergies  History   Social History  . Marital Status: Married    Spouse Name: [name]    Number of Children: [name]  . Years of Education: [name]   Occupational History  . Not on file.   Social History Main Topics  . Smoking status: Current Every Day Smoker  . Smokeless tobacco: Not on file  . Alcohol Use: Not on file  . Drug Use: Not on file  .  Sexual Activity: Not on file   Other Topics Concern  . Not on file   Social History Narrative  . No narrative on file     Review of Systems: Constitutional: negative for chills, fever, night sweats or weight changes Cardiovascular: negative for chest pain or palpitations Respiratory: negative for hemoptysis, wheezing, or shortness of breath Abdominal: negative for abdominal pain, nausea, vomiting or diarrhea Dermatological: negative for rash Neurologic: negative for headache   Physical Exam: Blood pressure 120/72, pulse 68, temperature 97.9 F (36.6 C), temperature source Oral, resp. rate 16, height 5\' 11"  (1.803 m), weight 189 lb 3.2 oz (85.821 kg), SpO2 99.00%., Body mass index is 26.4 kg/(m^2). General: Well developed, well nourished, in no acute distress. Head: Normocephalic, atraumatic, eyes without discharge, sclera non-icteric, nares are congested. Bilateral auditory canals clear, TM's are without perforation, pearly grey with reflective cone of light bilaterally. Serous effusion bilaterally behind TM's. Maxillary sinus TTP. Oral cavity moist, dentition normal. Posterior pharynx with post nasal drip and mild erythema. No peritonsillar abscess or tonsillar exudate. Neck: Supple. No thyromegaly. Full ROM. No lymphadenopathy. Lungs: Clear bilaterally to auscultation without wheezes, rales, or rhonchi. Breathing is unlabored.  Heart: RRR with S1 S2. No murmurs, rubs, or gallops appreciated. Msk:  Strength and tone normal for age. Extremities: No clubbing or cyanosis. No edema.  Right plantar wart trimmed and frozen with liquid  N2 Neuro: Alert and oriented X 3. Moves all extremities spontaneously. CNII-XII grossly in tact. Psych:  Responds to questions appropriately with a normal affect.    ASSESSMENT AND PLAN:  58 y.o. year old male with sinusitis, plantar wart, anxiety Sinusitis - Plan: amoxicillin (AMOXIL) 875 MG tablet  Insomnia - Plan: amitriptyline (ELAVIL) 50 MG  tablet  Plantar wart of right foot   -  -Tylenol/Motrin prn -Rest/fluids -RTC precautions -RTC 3-5 days if no improvement  Signed, [name], MD 02/14/2013 6:02 PM

## 2013-02-14 NOTE — Addendum Note (Signed)
Addended by: [name] on: 02/14/2013 06:04 PM   Modules accepted: Orders

## 2013-04-14 ENCOUNTER — Encounter (HOSPITAL_COMMUNITY): Payer: Self-pay | Admitting: Emergency Medicine

## 2013-04-14 ENCOUNTER — Emergency Department (HOSPITAL_COMMUNITY)
Admission: EM | Admit: 2013-04-14 | Discharge: 2013-04-14 | Disposition: A | Discharge status: Home / Self Care | Payer: Medicare Other | Attending: Emergency Medicine | Admitting: Emergency Medicine

## 2013-04-14 ENCOUNTER — Emergency Department (HOSPITAL_COMMUNITY): Payer: Medicare Other

## 2013-04-14 DIAGNOSIS — Z79899 Other long term (current) drug therapy: Secondary | ICD-10-CM | POA: Insufficient documentation

## 2013-04-14 DIAGNOSIS — Z8673 Personal history of transient ischemic attack (TIA), and cerebral infarction without residual deficits: Secondary | ICD-10-CM | POA: Insufficient documentation

## 2013-04-14 DIAGNOSIS — R5383 Other fatigue: Secondary | ICD-10-CM

## 2013-04-14 DIAGNOSIS — N200 Calculus of kidney: Secondary | ICD-10-CM

## 2013-04-14 DIAGNOSIS — F17200 Nicotine dependence, unspecified, uncomplicated: Secondary | ICD-10-CM | POA: Insufficient documentation

## 2013-04-14 DIAGNOSIS — R5381 Other malaise: Secondary | ICD-10-CM | POA: Insufficient documentation

## 2013-04-14 HISTORY — DX: Cerebral infarction, unspecified: I63.9

## 2013-04-14 LAB — URINE MICROSCOPIC-ADD ON

## 2013-04-14 LAB — LIPASE, BLOOD: LIPASE: 14 U/L (ref 11–59)

## 2013-04-14 LAB — CBC WITH DIFFERENTIAL/PLATELET
Basophils Absolute: 0 10*3/uL (ref 0.0–0.1)
Basophils Relative: 0 % (ref 0–1)
Eosinophils Absolute: 0.1 10*3/uL (ref 0.0–0.7)
Eosinophils Relative: 1 % (ref 0–5)
HCT: 42.8 % (ref 39.0–52.0)
HEMOGLOBIN: 14.7 g/dL (ref 13.0–17.0)
LYMPHS ABS: 1.9 10*3/uL (ref 0.7–4.0)
Lymphocytes Relative: 10 % — ABNORMAL LOW (ref 12–46)
MCH: 29.9 pg (ref 26.0–34.0)
MCHC: 34.3 g/dL (ref 30.0–36.0)
MCV: 87.2 fL (ref 78.0–100.0)
Monocytes Absolute: 0.8 10*3/uL (ref 0.1–1.0)
Monocytes Relative: 4 % (ref 3–12)
NEUTROS ABS: 16.3 10*3/uL — AB (ref 1.7–7.7)
NEUTROS PCT: 85 % — AB (ref 43–77)
PLATELETS: 214 10*3/uL (ref 150–400)
RBC: 4.91 MIL/uL (ref 4.22–5.81)
RDW: 13.7 % (ref 11.5–15.5)
WBC: 19.2 10*3/uL — AB (ref 4.0–10.5)

## 2013-04-14 LAB — COMPREHENSIVE METABOLIC PANEL
ALBUMIN: 3.9 g/dL (ref 3.5–5.2)
ALT: 16 U/L (ref 0–53)
AST: 18 U/L (ref 0–37)
Alkaline Phosphatase: 61 U/L (ref 39–117)
BILIRUBIN TOTAL: 0.4 mg/dL (ref 0.3–1.2)
BUN: 13 mg/dL (ref 6–23)
CALCIUM: 9.4 mg/dL (ref 8.4–10.5)
CO2: 27 meq/L (ref 19–32)
Chloride: 99 mEq/L (ref 96–112)
Creatinine, Ser: 1.12 mg/dL (ref 0.50–1.35)
GFR calc Af Amer: 83 mL/min — ABNORMAL LOW (ref >90)
GFR, EST NON AFRICAN AMERICAN: 72 mL/min — AB (ref >90)
Glucose, Bld: 126 mg/dL — ABNORMAL HIGH (ref 70–99)
Potassium: 4.1 mEq/L (ref 3.7–5.3)
Sodium: 140 mEq/L (ref 137–147)
Total Protein: 7.2 g/dL (ref 6.0–8.3)

## 2013-04-14 LAB — URINALYSIS, ROUTINE W REFLEX MICROSCOPIC
Bilirubin Urine: NEGATIVE
Glucose, UA: NEGATIVE mg/dL
Ketones, ur: NEGATIVE mg/dL
Nitrite: NEGATIVE
PROTEIN: 100 mg/dL — AB
Specific Gravity, Urine: 1.009 (ref 1.005–1.030)
UROBILINOGEN UA: 0.2 mg/dL (ref 0.0–1.0)
pH: 7 (ref 5.0–8.0)

## 2013-04-14 MED ORDER — ONDANSETRON 8 MG PO TBDP
ORAL_TABLET | ORAL | Status: DC
Start: 2013-04-14 — End: 2013-10-16

## 2013-04-14 MED ORDER — ONDANSETRON HCL 4 MG/2ML IJ SOLN
4.0000 mg | Freq: Once | INTRAMUSCULAR | Status: AC
  Administered 2013-04-14: 4 mg via INTRAVENOUS
  Filled 2013-04-14: qty 2

## 2013-04-14 MED ORDER — SODIUM CHLORIDE 0.9 % IV BOLUS (SEPSIS)
1000.0000 mL | Freq: Once | INTRAVENOUS | Status: AC
  Administered 2013-04-14: 1000 mL via INTRAVENOUS

## 2013-04-14 MED ORDER — CIPROFLOXACIN HCL 500 MG PO TABS: 500.0000 mg | ORAL_TABLET | Freq: Two times a day (BID) | ORAL | Status: DC

## 2013-04-14 MED ORDER — OXYCODONE-ACETAMINOPHEN 5-325 MG PO TABS: 2.0000 | ORAL_TABLET | ORAL | Status: DC | PRN

## 2013-04-14 MED ORDER — MORPHINE SULFATE 4 MG/ML IJ SOLN
4.0000 mg | Freq: Once | INTRAMUSCULAR | Status: AC
  Administered 2013-04-14: 4 mg via INTRAVENOUS
  Filled 2013-04-14: qty 1

## 2013-04-14 NOTE — ED Provider Notes (Signed)
CSN: [id number]     Arrival date & time 04/14/13  1062 History   First MD Initiated Contact with Patient 04/14/13 1143     Chief Complaint  Patient presents with  . Abdominal Pain  . Nausea  . Emesis  . Back Pain     (Consider location/radiation/quality/duration/timing/severity/associated sxs/prior Treatment) HPI Comments: Patient presents with abdominal and back pain. He has a history of recurrent kidney stones. He says that the pain feels similar to his kidney stone type pain. He describes intermittent pain for the last 5 days which has worsened today. It's across both sides of his lower abdomen and both sides of his low back. He states she's had bilateral pain with his kidney stones in the past. He's had some nausea and vomiting. He denies any diarrhea. He denies any fevers or chills. He's had some decreased urine output. He denies any known hematuria. He has not taken anything at home for the pain.  Patient is a 58 y.o. male presenting with abdominal pain, vomiting, and back pain.  Abdominal Pain Associated symptoms: fatigue, nausea and vomiting   Associated symptoms: no chest pain, no chills, no cough, no diarrhea, no fever, no hematuria and no shortness of breath   Emesis Associated symptoms: abdominal pain   Associated symptoms: no arthralgias, no chills, no diarrhea and no headaches   Back Pain Associated symptoms: abdominal pain   Associated symptoms: no chest pain, no fever, no headaches, no numbness and no weakness     Past Medical History  Diagnosis Date  . Stroke   . Kidney stone    History reviewed. No pertinent past surgical history. No family history on file. History  Substance Use Topics  . Smoking status: Current Every Day Smoker  . Smokeless tobacco: Not on file  . Alcohol Use: No    Review of Systems  Constitutional: Positive for fatigue. Negative for fever, chills and diaphoresis.  HENT: Negative for congestion, rhinorrhea and sneezing.   Eyes:  Negative.   Respiratory: Negative for cough, chest tightness and shortness of breath.   Cardiovascular: Negative for chest pain and leg swelling.  Gastrointestinal: Positive for nausea, vomiting and abdominal pain. Negative for diarrhea and blood in stool.  Genitourinary: Positive for flank pain, decreased urine volume and difficulty urinating. Negative for frequency, hematuria and testicular pain.  Musculoskeletal: Positive for back pain. Negative for arthralgias.  Skin: Negative for rash.  Neurological: Negative for dizziness, speech difficulty, weakness, numbness and headaches.      Allergies  Review of patient's allergies indicates no known allergies.  Home Medications   Current Outpatient Rx  Name  Route  Sig  Dispense  Refill  . ALPRAZolam (XANAX) 1 MG tablet   Oral   Take 1 tablet (1 mg total) by mouth 3 (three) times daily as needed for anxiety.   90 tablet   5   . amitriptyline (ELAVIL) 50 MG tablet   Oral   Take 1 tablet (50 mg total) by mouth at bedtime.   180 tablet   1   . lovastatin (MEVACOR) 20 MG tablet   Oral   Take 1 tablet (20 mg total) by mouth at bedtime.   90 tablet   3   . ciprofloxacin (CIPRO) 500 MG tablet   Oral   Take 1 tablet (500 mg total) by mouth 2 (two) times daily. One po bid x 7 days   14 tablet   0   . ondansetron (ZOFRAN ODT) 8 MG disintegrating tablet  8mg  ODT q4 hours prn nausea   10 tablet   0   . oxyCODONE-acetaminophen (PERCOCET) 5-325 MG per tablet   Oral   Take 2 tablets by mouth every 4 (four) hours as needed.   20 tablet   0    BP 155/68  Pulse 79  Temp(Src) 97.8 F (36.6 C) (Oral)  Resp 18  Ht 5\' 9"  (1.753 m)  Wt 187 lb (84.823 kg)  BMI 27.60 kg/m2  SpO2 96% Physical Exam  Constitutional: He is oriented to person, place, and time. He appears well-developed and well-nourished.  HENT:  Head: Normocephalic and atraumatic.  Eyes: Pupils are equal, round, and reactive to light.  Neck: Normal range of  motion. Neck supple.  Cardiovascular: Normal rate, regular rhythm and normal heart sounds.   Pulmonary/Chest: Effort normal and breath sounds normal. No respiratory distress. He has no wheezes. He has no rales. He exhibits no tenderness.  Abdominal: Soft. Bowel sounds are normal. There is tenderness (moderate tenderness across the lower abdomen and suprapubic area. Bilateral CVA tenderness). There is no rebound and no guarding.  Musculoskeletal: Normal range of motion. He exhibits no edema.  Lymphadenopathy:    He has no cervical adenopathy.  Neurological: He is alert and oriented to person, place, and time.  Skin: Skin is warm and dry. No rash noted.  Psychiatric: He has a normal mood and affect.    ED Course  Procedures (including critical care time) Labs Review Results for orders placed during the hospital encounter of 04/14/13  CBC WITH DIFFERENTIAL      Result Value Ref Range   WBC 19.2 (*) 4.0 - 10.5 K/uL   RBC 4.91  4.22 - 5.81 MIL/uL   Hemoglobin 14.7  13.0 - 17.0 g/dL   HCT 42.8  39.0 - 52.0 %   MCV 87.2  78.0 - 100.0 fL   MCH 29.9  26.0 - 34.0 pg   MCHC 34.3  30.0 - 36.0 g/dL   RDW 13.7  11.5 - 15.5 %   Platelets 214  150 - 400 K/uL   Neutrophils Relative % 85 (*) 43 - 77 %   Neutro Abs 16.3 (*) 1.7 - 7.7 K/uL   Lymphocytes Relative 10 (*) 12 - 46 %   Lymphs Abs 1.9  0.7 - 4.0 K/uL   Monocytes Relative 4  3 - 12 %   Monocytes Absolute 0.8  0.1 - 1.0 K/uL   Eosinophils Relative 1  0 - 5 %   Eosinophils Absolute 0.1  0.0 - 0.7 K/uL   Basophils Relative 0  0 - 1 %   Basophils Absolute 0.0  0.0 - 0.1 K/uL  COMPREHENSIVE METABOLIC PANEL      Result Value Ref Range   Sodium 140  137 - 147 mEq/L   Potassium 4.1  3.7 - 5.3 mEq/L   Chloride 99  96 - 112 mEq/L   CO2 27  19 - 32 mEq/L   Glucose, Bld 126 (*) 70 - 99 mg/dL   BUN 13  6 - 23 mg/dL   Creatinine, Ser 1.12  0.50 - 1.35 mg/dL   Calcium 9.4  8.4 - 10.5 mg/dL   Total Protein 7.2  6.0 - 8.3 g/dL   Albumin 3.9  3.5  - 5.2 g/dL   AST 18  0 - 37 U/L   ALT 16  0 - 53 U/L   Alkaline Phosphatase 61  39 - 117 U/L   Total Bilirubin 0.4  0.3 -  1.2 mg/dL   GFR calc non Af Amer 72 (*) >90 mL/min   GFR calc Af Amer 83 (*) >90 mL/min  URINALYSIS, ROUTINE W REFLEX MICROSCOPIC      Result Value Ref Range   Color, Urine YELLOW  YELLOW   APPearance CLEAR  CLEAR   Specific Gravity, Urine 1.009  1.005 - 1.030   pH 7.0  5.0 - 8.0   Glucose, UA NEGATIVE  NEGATIVE mg/dL   Hgb urine dipstick SMALL (*) NEGATIVE   Bilirubin Urine NEGATIVE  NEGATIVE   Ketones, ur NEGATIVE  NEGATIVE mg/dL   Protein, ur 100 (*) NEGATIVE mg/dL   Urobilinogen, UA 0.2  0.0 - 1.0 mg/dL   Nitrite NEGATIVE  NEGATIVE   Leukocytes, UA MODERATE (*) NEGATIVE  LIPASE, BLOOD      Result Value Ref Range   Lipase 14  11 - 59 U/L  URINE MICROSCOPIC-ADD ON      Result Value Ref Range   Squamous Epithelial / LPF RARE  RARE   WBC, UA 21-50  <3 WBC/hpf   RBC / HPF 0-2  <3 RBC/hpf   Bacteria, UA RARE  RARE   Crystals CA OXALATE CRYSTALS (*) NEGATIVE   No results found.   Imaging Review Ct Abdomen Pelvis Wo Contrast  04/14/2013   CLINICAL DATA:  Lower abdominal pain.  EXAM: CT ABDOMEN AND PELVIS WITHOUT CONTRAST  TECHNIQUE: Multidetector CT imaging of the abdomen and pelvis was performed following the standard protocol without intravenous contrast.  COMPARISON:  None.  FINDINGS: Degenerative disc disease is noted at L3-4 and L4-5. Visualized lung bases appear normal.  No focal abnormality is seen in the liver, spleen or pancreas on these images. No gallstones are noted. Adrenal glands appear normal. Nonobstructive right nephrolithiasis is noted. Right ureter appears normal. Severe left hydronephrosis is noted with perinephric stranding secondary to 10 x 8 mm calculus in the distal left ureter. 4.3 cm cyst is seen in upper pole of left kidney with peripheral calcification. Urinary bladder appears normal. The appendix appears normal. Atherosclerotic  calcifications of abdominal aorta are noted without aneurysm formation. No abnormal fluid collections noted. There is no evidence of bowel obstruction. No significant adenopathy is noted.  IMPRESSION: Nonobstructive right nephrolithiasis is noted.  Severe left hydronephrosis with perinephric stranding is noted secondary to 10 x 8 mm calculus in distal left ureter.   Electronically Signed   By: Sabino Dick M.D.   On: 04/14/2013 13:43     EKG Interpretation None      MDM   Final diagnoses:  Kidney stone    Pt with large distal ureteral stone.  No fever.  Pain controlled.  Spoke with Dr Teena Dunk who is okay with pt going home.  Will give rx for cipro, percocet, zofran.  Pt to f/u early next week with urology. Pt to call on Monday for appt.  Advised to return to [facility] if pain worsens, have vomiting or develops any fever.    Malvin Johns, MD 04/14/13 [phone]

## 2013-04-14 NOTE — Discharge Instructions (Signed)
Kidney Stones  Kidney stones (urolithiasis) are deposits that form inside your kidneys. The intense pain is caused by the stone moving through the urinary tract. When the stone moves, the ureter goes into spasm around the stone. The stone is usually passed in the urine.   CAUSES   · A disorder that makes certain neck glands produce too much parathyroid hormone (primary hyperparathyroidism).  · A buildup of uric acid crystals, similar to gout in your joints.  · Narrowing (stricture) of the ureter.  · A kidney obstruction present at birth (congenital obstruction).  · Previous surgery on the kidney or ureters.  · Numerous kidney infections.  SYMPTOMS   · Feeling sick to your stomach (nauseous).  · Throwing up (vomiting).  · Blood in the urine (hematuria).  · Pain that usually spreads (radiates) to the groin.  · Frequency or urgency of urination.  DIAGNOSIS   · Taking a history and physical exam.  · Blood or urine tests.  · CT scan.  · Occasionally, an examination of the inside of the urinary bladder (cystoscopy) is performed.  TREATMENT   · Observation.  · Increasing your fluid intake.  · Extracorporeal shock wave lithotripsy This is a noninvasive procedure that uses shock waves to break up kidney stones.  · Surgery may be needed if you have severe pain or persistent obstruction. There are various surgical procedures. Most of the procedures are performed with the use of small instruments. Only small incisions are needed to accommodate these instruments, so recovery time is minimized.  The size, location, and chemical composition are all important variables that will determine the proper choice of action for you. Talk to your health care provider to better understand your situation so that you will minimize the risk of injury to yourself and your kidney.   HOME CARE INSTRUCTIONS   · Drink enough water and fluids to keep your urine clear or pale yellow. This will help you to pass the stone or stone fragments.  · Strain  all urine through the provided strainer. Keep all particulate matter and stones for your health care provider to see. The stone causing the pain may be as small as a grain of salt. It is very important to use the strainer each and every time you pass your urine. The collection of your stone will allow your health care provider to analyze it and verify that a stone has actually passed. The stone analysis will often identify what you can do to reduce the incidence of recurrences.  · Only take over-the-counter or prescription medicines for pain, discomfort, or fever as directed by your health care provider.  · Make a follow-up appointment with your health care provider as directed.  · Get follow-up X-rays if required. The absence of pain does not always mean that the stone has passed. It may have only stopped moving. If the urine remains completely obstructed, it can cause loss of kidney function or even complete destruction of the kidney. It is your responsibility to make sure X-rays and follow-ups are completed. Ultrasounds of the kidney can show blockages and the status of the kidney. Ultrasounds are not associated with any radiation and can be performed easily in a matter of minutes.  SEEK MEDICAL CARE IF:  · You experience pain that is progressive and unresponsive to any pain medicine you have been prescribed.  SEEK IMMEDIATE MEDICAL CARE IF:   · Pain cannot be controlled with the prescribed medicine.  · You have a fever   or shaking chills.  · The severity or intensity of pain increases over 18 hours and is not relieved by pain medicine.  · You develop a new onset of abdominal pain.  · You feel faint or pass out.  · You are unable to urinate.  MAKE SURE YOU:   · Understand these instructions.  · Will watch your condition.  · Will get help right away if you are not doing well or get worse.  Document Released: 01/12/2005 Document Revised: 09/14/2012 Document Reviewed: 06/15/2012  [employer]® Patient Information ©2014  [employer].

## 2013-04-14 NOTE — ED Notes (Signed)
Pt attempted to void in urinal. Unable to void at this time.

## 2013-04-14 NOTE — ED Notes (Signed)
Pt c/o lower abdominal pain with n/v ongoing since mOnday. Pt also reports low back pain onset Monday. Pt reports decrease urine output.

## 2013-04-16 LAB — URINE CULTURE: SPECIAL REQUESTS: NORMAL

## 2013-09-11 ENCOUNTER — Other Ambulatory Visit: Payer: Self-pay | Admitting: Family Medicine

## 2013-09-14 NOTE — Telephone Encounter (Signed)
Faxed

## 2013-09-28 ENCOUNTER — Telehealth: Payer: Self-pay | Admitting: *Deleted

## 2013-09-28 DIAGNOSIS — F411 Generalized anxiety disorder: Secondary | ICD-10-CM

## 2013-09-28 NOTE — Telephone Encounter (Signed)
Spoke with patient's wife, June, and scheduled patient's Annual Medicare Wellness Exam for 10/16/13 for 1045.  Spouse requested earlier (than October) appt r/t pt needing refills.

## 2013-10-02 ENCOUNTER — Telehealth: Payer: Self-pay

## 2013-10-02 DIAGNOSIS — G4700 Insomnia, unspecified: Secondary | ICD-10-CM

## 2013-10-02 MED ORDER — AMITRIPTYLINE HCL 50 MG PO TABS: 50.0000 mg | ORAL_TABLET | Freq: Every day | ORAL | Status: DC

## 2013-10-02 MED ORDER — ALPRAZOLAM 1 MG PO TABS: 1.0000 mg | ORAL_TABLET | Freq: Three times a day (TID) | ORAL | Status: DC | PRN

## 2013-10-02 NOTE — Telephone Encounter (Signed)
Dr L asked me to fax in a Rx for alprazolam. Notified pt done, and he also asked for a RF of amitriptyline until his appt. Sent this as well.

## 2013-10-02 NOTE — Addendum Note (Signed)
Addended by: [name] on: 10/02/2013 08:12 AM   Modules accepted: Orders

## 2013-10-13 ENCOUNTER — Telehealth: Payer: Self-pay | Admitting: *Deleted

## 2013-10-16 ENCOUNTER — Ambulatory Visit (INDEPENDENT_AMBULATORY_CARE_PROVIDER_SITE_OTHER): Payer: Medicare Other | Admitting: Family Medicine

## 2013-10-16 ENCOUNTER — Telehealth: Payer: Self-pay

## 2013-10-16 ENCOUNTER — Encounter: Payer: Self-pay | Admitting: Family Medicine

## 2013-10-16 ENCOUNTER — Encounter: Payer: Self-pay | Admitting: Gastroenterology

## 2013-10-16 VITALS — BP 131/71 | HR 74 | Temp 97.5°F | Resp 16 | Ht 69.0 in | Wt 180.0 lb

## 2013-10-16 DIAGNOSIS — I69339 Monoplegia of upper limb following cerebral infarction affecting unspecified side: Secondary | ICD-10-CM

## 2013-10-16 DIAGNOSIS — B070 Plantar wart: Secondary | ICD-10-CM

## 2013-10-16 DIAGNOSIS — I69939 Monoplegia of upper limb following unspecified cerebrovascular disease affecting unspecified side: Secondary | ICD-10-CM

## 2013-10-16 DIAGNOSIS — E785 Hyperlipidemia, unspecified: Secondary | ICD-10-CM

## 2013-10-16 DIAGNOSIS — Z0000 Encounter for general adult medical examination without abnormal findings: Secondary | ICD-10-CM

## 2013-10-16 DIAGNOSIS — G4700 Insomnia, unspecified: Secondary | ICD-10-CM

## 2013-10-16 DIAGNOSIS — J0101 Acute recurrent maxillary sinusitis: Secondary | ICD-10-CM

## 2013-10-16 DIAGNOSIS — I10 Essential (primary) hypertension: Secondary | ICD-10-CM

## 2013-10-16 DIAGNOSIS — F411 Generalized anxiety disorder: Secondary | ICD-10-CM

## 2013-10-16 LAB — CBC WITH DIFFERENTIAL/PLATELET
Basophils Absolute: 0.1 10*3/uL (ref 0.0–0.1)
Basophils Relative: 1 % (ref 0–1)
Eosinophils Absolute: 0.5 10*3/uL (ref 0.0–0.7)
Eosinophils Relative: 6 % — ABNORMAL HIGH (ref 0–5)
HCT: 44.1 % (ref 39.0–52.0)
Hemoglobin: 14.8 g/dL (ref 13.0–17.0)
Lymphocytes Relative: 22 % (ref 12–46)
Lymphs Abs: 1.8 10*3/uL (ref 0.7–4.0)
MCH: 28.7 pg (ref 26.0–34.0)
MCHC: 33.6 g/dL (ref 30.0–36.0)
MCV: 85.5 fL (ref 78.0–100.0)
Monocytes Absolute: 0.5 10*3/uL (ref 0.1–1.0)
Monocytes Relative: 6 % (ref 3–12)
Neutro Abs: 5.3 10*3/uL (ref 1.7–7.7)
Neutrophils Relative %: 65 % (ref 43–77)
Platelets: 241 10*3/uL (ref 150–400)
RBC: 5.16 MIL/uL (ref 4.22–5.81)
RDW: 14.3 % (ref 11.5–15.5)
WBC: 8.1 10*3/uL (ref 4.0–10.5)

## 2013-10-16 MED ORDER — ALPRAZOLAM 1 MG PO TABS: 1.0000 mg | ORAL_TABLET | Freq: Three times a day (TID) | ORAL | Status: DC | PRN

## 2013-10-16 MED ORDER — AMITRIPTYLINE HCL 50 MG PO TABS: 50.0000 mg | ORAL_TABLET | Freq: Every day | ORAL | Status: DC

## 2013-10-16 MED ORDER — AMOXICILLIN 875 MG PO TABS: 875.0000 mg | ORAL_TABLET | Freq: Two times a day (BID) | ORAL | Status: DC

## 2013-10-16 MED ORDER — LOVASTATIN 20 MG PO TABS: 20.0000 mg | ORAL_TABLET | Freq: Every day | ORAL | Status: DC

## 2013-10-16 NOTE — Progress Notes (Signed)
@[email or web address] @  Patient ID: Roy Moore MRN: [id number], DOB: 05/15/1955, 58 y.o. Date of Encounter: 10/16/2013, 11:17 AM  Primary Physician: [name], MD  Chief Complaint:   HPI: 58 y.o. year old male with history below presents with  Patient notes that he works in the [employer] in the weekend; he gets by financially on social security and disability.  He smokes 1/2ppd.   Patient reports that his strength is gradually increasing post CVA and that he rarely notes generalized fatigue or weakness.   He denies any SOB but notes that his wife would "like his breathing checked."  He denies any trouble swallowing or any reflux.   He notes a small plantars wart on the bottom of the right foot; he would like this removed as he finds it painful.   He reports intermittent neck and back pain; he would not like a flu shot but is looking to have another colonoscopy done. (Previously seen Dr. Joneen Caraway at [facility].)    Past Medical History  Diagnosis Date  . Stroke   . Kidney stone      Home Meds: Prior to Admission medications   Medication Sig Start Date End Date Taking? Authorizing Provider  ALPRAZolam [name]) 1 MG tablet Take 1 tablet (1 mg total) by mouth 3 (three) times daily as needed for anxiety. 10/02/13  Yes [name], MD  amitriptyline (ELAVIL) 50 MG tablet Take 1 tablet (50 mg total) by mouth at bedtime. 10/02/13  Yes [name], MD  lovastatin (MEVACOR) 20 MG tablet Take 1 tablet (20 mg total) by mouth at bedtime. 02/14/13  Yes [name], MD  ciprofloxacin (CIPRO) 500 MG tablet Take 1 tablet (500 mg total) by mouth 2 (two) times daily. One po bid x 7 days 04/14/13   Malvin Johns, MD  ondansetron (ZOFRAN ODT) 8 MG disintegrating tablet 8mg  ODT q4 hours prn nausea 04/14/13   Malvin Johns, MD  oxyCODONE-acetaminophen (PERCOCET) 5-325 MG per tablet Take 2 tablets by mouth every 4 (four) hours as needed. 04/14/13   Malvin Johns, MD    Allergies: No Known  Allergies  History   Social History  . Marital Status: Married    Spouse Name: [name]    Number of Children: [name]  . Years of Education: [name]   Occupational History  . Not on file.   Social History Main Topics  . Smoking status: Current Every Day Smoker  . Smokeless tobacco: Not on file  . Alcohol Use: No  . Drug Use: No  . Sexual Activity: Not on file   Other Topics Concern  . Not on file   Social History Narrative  . No narrative on file     Review of Systems: Constitutional: negative for chills, fever, night sweats, weight changes, or fatigue  HEENT: negative for vision changes, hearing loss, congestion, rhinorrhea, ST, epistaxis, or sinus pressure Cardiovascular: negative for chest pain or palpitations Respiratory: negative for hemoptysis, wheezing, shortness of breath, or cough Abdominal: negative for abdominal pain, nausea, vomiting, diarrhea, or constipation Dermatological: negative for rash Neurologic: negative for headache, dizziness, or syncope All other systems reviewed and are otherwise negative with the exception to those above and in the HPI.   Physical Exam: Blood pressure 131/71, pulse 74, temperature 97.5 F (36.4 C), temperature source Oral, resp. rate 16, height 5\' 9"  (1.753 m), weight 180 lb (81.647 kg), SpO2 96.00%., Body mass index is 26.57 kg/(m^2). General: Well developed, well nourished, in no acute distress. Head: Normocephalic, atraumatic, eyes  without discharge, sclera non-icteric, nares are without discharge. Bilateral auditory canals clear, TM's are without perforation, pearly grey and translucent with reflective cone of light bilaterally. Oral cavity moist, posterior pharynx without exudate, erythema, peritonsillar abscess, or post nasal drip.  Neck: Supple. No thyromegaly. Full ROM. No lymphadenopathy. Lungs: Clear bilaterally to auscultation without wheezes, rales, or rhonchi. Breathing is unlabored. Heart: RRR with S1 S2. No murmurs, rubs, or  gallops appreciated. Abdomen: Soft, non-tender, non-distended with normoactive bowel sounds. No hepatomegaly. No rebound/guarding. No obvious abdominal masses. Msk:  Strength and tone normal for age. Extremities/Skin: Warm and dry. No clubbing or cyanosis. No edema. No rashes or suspicious lesions. Posterior neck skin tag 6 mm, 3 mm right nasal papule.  Plantar wart right foot at heel debrided Neuro: Alert and oriented X 3. Moves all extremities spontaneously. Gait is normal. CNII-XII grossly in tact.  Mild left decreased strength Psych:  Responds to questions appropriately with a normal affect.     ASSESSMENT AND PLAN:  58 y.o. year old male with Annual physical exam - Plan: Ambulatory referral to Gastroenterology  Essential hypertension - Plan: CBC with Differential, COMPLETE METABOLIC PANEL WITH GFR  Monoplegia, upper limb, nondominant side S/P CVA (cerebrovascular acc)  Anxiety state, unspecified - Plan: ALPRAZolam (XANAX) 1 MG tablet, DISCONTINUED: ALPRAZolam (XANAX) 1 MG tablet  Essential hypertension, Chronic - Plan: CBC with Differential, COMPLETE METABOLIC PANEL WITH GFR  Insomnia - Plan: amitriptyline (ELAVIL) 50 MG tablet, DISCONTINUED: amitriptyline (ELAVIL) 50 MG tablet  Hyperlipidemia - Plan: lovastatin (MEVACOR) 20 MG tablet, Lipid panel     Signed, [name], MD 10/16/2013 11:17 AM

## 2013-10-16 NOTE — Telephone Encounter (Signed)
Patient was just seen this morning at the appointment center with Dr. Joseph Art. Patient states he forgot to tell him he has a sinus infection. Requesting an antibiotic to be sent to the pharmacy. Patient uses [facility] on [address]. Patients call back number is [phone]

## 2013-10-16 NOTE — Patient Instructions (Signed)

## 2013-10-17 LAB — COMPLETE METABOLIC PANEL WITH GFR
ALT: 8 U/L (ref 0–53)
AST: 15 U/L (ref 0–37)
Albumin: 4.4 g/dL (ref 3.5–5.2)
Alkaline Phosphatase: 54 U/L (ref 39–117)
BUN: 11 mg/dL (ref 6–23)
CO2: 29 mEq/L (ref 19–32)
Calcium: 9.4 mg/dL (ref 8.4–10.5)
Chloride: 101 mEq/L (ref 96–112)
Creat: 1.24 mg/dL (ref 0.50–1.35)
GFR, Est African American: 74 mL/min
GFR, Est Non African American: 64 mL/min
Glucose, Bld: 82 mg/dL (ref 70–99)
Potassium: 4.5 mEq/L (ref 3.5–5.3)
Sodium: 137 mEq/L (ref 135–145)
Total Bilirubin: 0.5 mg/dL (ref 0.2–1.2)
Total Protein: 6.7 g/dL (ref 6.0–8.3)

## 2013-10-17 LAB — LIPID PANEL
Cholesterol: 130 mg/dL (ref 0–200)
HDL: 55 mg/dL (ref >39)
LDL Cholesterol: 60 mg/dL (ref 0–99)
Total CHOL/HDL Ratio: 2.4 Ratio
Triglycerides: 73 mg/dL (ref <150)
VLDL: 15 mg/dL (ref 0–40)

## 2013-12-11 ENCOUNTER — Ambulatory Visit (AMBULATORY_SURGERY_CENTER): Payer: Self-pay | Admitting: *Deleted

## 2013-12-11 VITALS — Ht 69.0 in | Wt 180.2 lb

## 2013-12-11 DIAGNOSIS — Z1211 Encounter for screening for malignant neoplasm of colon: Secondary | ICD-10-CM

## 2013-12-11 MED ORDER — MOVIPREP 100 G PO SOLR: 1.0000 | Freq: Once | ORAL | Status: DC

## 2013-12-11 NOTE — Progress Notes (Signed)
No egg or soy allergy. ewm No home 02 use. ewm No diet pills, no blood thinners. ewm No issues with past sedation. ewm

## 2013-12-13 ENCOUNTER — Ambulatory Visit (AMBULATORY_SURGERY_CENTER): Payer: Medicare Other | Admitting: Gastroenterology

## 2013-12-13 ENCOUNTER — Encounter: Payer: Self-pay | Admitting: Gastroenterology

## 2013-12-13 VITALS — BP 113/68 | HR 76 | Temp 95.6°F | Resp 15 | Ht 69.0 in | Wt 180.0 lb

## 2013-12-13 DIAGNOSIS — D128 Benign neoplasm of rectum: Secondary | ICD-10-CM

## 2013-12-13 DIAGNOSIS — D124 Benign neoplasm of descending colon: Secondary | ICD-10-CM

## 2013-12-13 DIAGNOSIS — Z1211 Encounter for screening for malignant neoplasm of colon: Secondary | ICD-10-CM | POA: Diagnosis not present

## 2013-12-13 DIAGNOSIS — D122 Benign neoplasm of ascending colon: Secondary | ICD-10-CM | POA: Diagnosis not present

## 2013-12-13 DIAGNOSIS — D123 Benign neoplasm of transverse colon: Secondary | ICD-10-CM

## 2013-12-13 MED ORDER — SODIUM CHLORIDE 0.9 % IV SOLN: 500.0000 mL | INTRAVENOUS | Status: DC

## 2013-12-13 NOTE — Op Note (Signed)
[facility]  [address] [place], [id number]   COLONOSCOPY PROCEDURE REPORT PATIENT: Roy Moore, Roy Moore  MR#: [id number] BIRTHDATE: Jan 28, 1955 , 44  yrs. old GENDER: male ENDOSCOPIST: [name], MD, [facility] REFERRED IO:[id number], Synetta Shadow PROCEDURE DATE:  12/13/2013 PROCEDURE:   Colonoscopy with biopsy and Colonoscopy with snare polypectomy First Screening Colonoscopy - Avg.  risk and is 50 yrs.  old or older Yes.  Prior Negative Screening - Now for repeat screening. [name]  History of Adenoma - Now for follow-up colonoscopy & has been > or = to 3 yrs.  [name]  Polyps Removed Today? Yes. ASA CLASS:   Class II INDICATIONS:average risk for colorectal cancer. MEDICATIONS: Monitored anesthesia care and Propofol 320 mg IV DESCRIPTION OF PROCEDURE:   After the risks benefits and alternatives of the procedure were thoroughly explained, informed consent was obtained.  The digital rectal exam revealed no abnormalities of the rectum.   The LB [id number]  endoscope was introduced through the anus and advanced to the cecum, which was identified by both the appendix and ileocecal valve. No adverse events experienced.   The quality of the prep was excellent, using MoviPrep  The instrument was then slowly withdrawn as the colon was fully examined.  COLON FINDINGS: Three sessile polyps measuring 7 mm in size were found in the ascending colon.  A polypectomy was performed with a cold snare.  The resection was complete, the polyp tissue was completely retrieved and sent to histology.   A sessile polyp measuring 4 mm in size was found in the ascending colon.  A polypectomy was performed with cold forceps.  The resection was complete, the polyp tissue was completely retrieved and sent to histology.   Three sessile polyps measuring 8 mm in size were found in the descending colon, rectum, and transverse colon.  A polypectomy was performed with a cold snare.  The resection  was complete, the polyp tissue was completely retrieved and sent to histology.   The examination was otherwise normal.  Retroflexed views revealed internal Grade II hemorrhoids. The time to cecum=2 minutes 03 seconds.  Withdrawal time=10 minutes 07 seconds.  The scope was withdrawn and the procedure completed. COMPLICATIONS: There were no immediate complications.  ENDOSCOPIC IMPRESSION: 1.   Three sessile polyps in the ascending colon; polypectomy performed with a cold snare 2.   Sessile polyp in the ascending colon; polypectomy performed with cold forceps 3.   Three sessile polyps in the descending, rectum, and transverse colon; polypectomy performed with a cold snare 4.   Grade Il internal hemorrhoids  RECOMMENDATIONS: 1.  Await pathology results 2.  Repeat colonoscopy in 3 years if 3 or more polyps adenomatous; 5 years if 1-2 adenomatous: otherwise 10 years  eSigned:  [name], MD, [facility] 2013-12-13 72:53:[id number]

## 2013-12-13 NOTE — Patient Instructions (Signed)
YOU HAD AN ENDOSCOPIC PROCEDURE TODAY AT THE [facility]: Refer to the procedure report that was given to you for any specific questions about what was found during the examination.  If the procedure report does not answer your questions, please call your gastroenterologist to clarify.  If you requested that your care partner not be given the details of your procedure findings, then the procedure report has been included in a sealed envelope for you to review at your convenience later.  YOU SHOULD EXPECT: Some feelings of bloating in the abdomen. Passage of more gas than usual.  Walking can help get rid of the air that was put into your GI tract during the procedure and reduce the bloating. If you had a lower endoscopy (such as a colonoscopy or flexible sigmoidoscopy) you may notice spotting of blood in your stool or on the toilet paper. If you underwent a bowel prep for your procedure, then you may not have a normal bowel movement for a few days.  DIET: Your first meal following the procedure should be a light meal and then it is ok to progress to your normal diet.  A half-sandwich or bowl of soup is an example of a good first meal.  Heavy or fried foods are harder to digest and may make you feel nauseous or bloated.  Likewise meals heavy in dairy and vegetables can cause extra gas to form and this can also increase the bloating.  Drink plenty of fluids but you should avoid alcoholic beverages for 24 hours.  ACTIVITY: Your care partner should take you home directly after the procedure.  You should plan to take it easy, moving slowly for the rest of the day.  You can resume normal activity the day after the procedure however you should NOT DRIVE or use heavy machinery for 24 hours (because of the sedation medicines used during the test).    SYMPTOMS TO REPORT IMMEDIATELY: A gastroenterologist can be reached at any hour.  During normal business hours, 8:30 AM to 5:00 PM Monday through Friday,  call [phone].  After hours and on weekends, please call the GI answering service at [phone] who will take a message and have the physician on call contact you.   Following lower endoscopy (colonoscopy or flexible sigmoidoscopy):  Excessive amounts of blood in the stool  Significant tenderness or worsening of abdominal pains  Swelling of the abdomen that is new, acute  Fever of 100F or higher  FOLLOW UP: If any biopsies were taken you will be contacted by phone or by letter within the next 1-3 weeks.  Call your gastroenterologist if you have not heard about the biopsies in 3 weeks.  Our staff will call the home number listed on your records the next business day following your procedure to check on you and address any questions or concerns that you may have at that time regarding the information given to you following your procedure. This is a courtesy call and so if there is no answer at the home number and we have not heard from you through the emergency physician on call, we will assume that you have returned to your regular daily activities without incident.  SIGNATURES/CONFIDENTIALITY: You and/or your care partner have signed paperwork which will be entered into your electronic medical record.  These signatures attest to the fact that that the information above on your After Visit Summary has been reviewed and is understood.  Full responsibility of the confidentiality of this   discharge information lies with you and/or your care-partner.  Polyps, hemorrhoids, high fiber diet-handouts given  Repeat colonoscopy will be determined by pathology.

## 2013-12-13 NOTE — Progress Notes (Signed)
A/ox3, pleased with MAC, report to RN 

## 2013-12-14 ENCOUNTER — Telehealth: Payer: Self-pay | Admitting: *Deleted

## 2013-12-14 NOTE — Telephone Encounter (Signed)
  Follow up Call-  Call back number 12/13/2013  Post procedure Call Back phone  # [phone] (H)  Permission to leave phone message Yes     Patient questions:  Do you have a fever, pain , or abdominal swelling? No. Pain Score  0 *  Have you tolerated food without any problems? Yes.    Have you been able to return to your normal activities? Yes.    Do you have any questions about your discharge instructions: Diet   No. Medications  No. Follow up visit  No.  Do you have questions or concerns about your Care? No.  Actions: * If pain score is 4 or above: No action needed, pain <4.

## 2013-12-18 ENCOUNTER — Encounter: Payer: Self-pay | Admitting: Gastroenterology

## 2014-01-21 ENCOUNTER — Encounter (HOSPITAL_COMMUNITY): Payer: Self-pay | Admitting: Emergency Medicine

## 2014-01-21 ENCOUNTER — Emergency Department (INDEPENDENT_AMBULATORY_CARE_PROVIDER_SITE_OTHER)
Admission: EM | Admit: 2014-01-21 | Discharge: 2014-01-21 | Disposition: A | Discharge status: Home / Self Care | Payer: Medicare Other | Source: Home / Self Care | Attending: Family Medicine | Admitting: Family Medicine

## 2014-01-21 DIAGNOSIS — S41112A Laceration without foreign body of left upper arm, initial encounter: Secondary | ICD-10-CM

## 2014-01-21 MED ORDER — TETANUS-DIPHTH-ACELL PERTUSSIS 5-2.5-18.5 LF-MCG/0.5 IM SUSP
0.5000 mL | Freq: Once | INTRAMUSCULAR | Status: AC
  Administered 2014-01-21: 0.5 mL via INTRAMUSCULAR

## 2014-01-21 MED ORDER — TETANUS-DIPHTH-ACELL PERTUSSIS 5-2.5-18.5 LF-MCG/0.5 IM SUSP: 0.5000 mL | Freq: Once | INTRAMUSCULAR | Status: DC

## 2014-01-21 MED ORDER — LIDOCAINE HCL (PF) 2 % IJ SOLN
INTRAMUSCULAR | Status: AC
Start: 2014-01-21 — End: 2014-01-21
  Filled 2014-01-21: qty 2

## 2014-01-21 MED ORDER — TETANUS-DIPHTH-ACELL PERTUSSIS 5-2.5-18.5 LF-MCG/0.5 IM SUSP
INTRAMUSCULAR | Status: AC
  Filled 2014-01-21: qty 0.5

## 2014-01-21 NOTE — ED Provider Notes (Signed)
CSN: [id number]     Arrival date & time 01/21/14  1700 History   First MD Initiated Contact with Patient 01/21/14 1717     Chief Complaint  Patient presents with  . Laceration   (Consider location/radiation/quality/duration/timing/severity/associated sxs/prior Treatment) Patient is a 58 y.o. male presenting with skin laceration. The history is provided by the patient.  Laceration Location:  Shoulder/arm Shoulder/arm laceration location:  L forearm Length (cm):  5 Depth:  Through dermis (muscle exposed, full rom.) Quality: straight   Bleeding: controlled   Time since incident:  3 hours Laceration mechanism:  Knife Pain details:    Severity:  No pain Foreign body present:  No foreign bodies Ineffective treatments:  None tried Tetanus status:  Out of date   Past Medical History  Diagnosis Date  . Stroke   . Kidney stone   . Hyperlipidemia    Past Surgical History  Procedure Laterality Date  . Back surgery      10 yrs ago or so-fusion in neck   . Colonoscopy      15-20 yrs ago-normal per pt.   . Teeth removal     Family History  Problem Relation Age of Onset  . Colon cancer Neg Hx   . Rectal cancer Neg Hx   . Stomach cancer Neg Hx   . Esophageal cancer Neg Hx    History  Substance Use Topics  . Smoking status: Current Every Day Smoker -- 1.00 packs/day    Types: Cigarettes  . Smokeless tobacco: Never Used  . Alcohol Use: No    Review of Systems  Constitutional: Negative.   Skin: Positive for wound.    Allergies  Review of patient's allergies indicates no known allergies.  Home Medications   Prior to Admission medications   Medication Sig Start Date End Date Taking? Authorizing Provider  ALPRAZolam [name]) 1 MG tablet Take 1 tablet (1 mg total) by mouth 3 (three) times daily as needed for anxiety. 10/16/13   [name], MD  amitriptyline (ELAVIL) 50 MG tablet Take 1 tablet (50 mg total) by mouth at bedtime. 10/16/13   [name], MD  aspirin 81  MG chewable tablet Chew by mouth daily.    Historical Provider, MD  B Complex Vitamins (VITAMIN B COMPLEX PO) Take by mouth daily.    Historical Provider, MD  Cholecalciferol (VITAMIN D PO) Take by mouth daily.    Historical Provider, MD  lovastatin (MEVACOR) 20 MG tablet Take 1 tablet (20 mg total) by mouth at bedtime. 10/16/13   [name], MD  Thiamine HCl (VITAMIN B-1 PO) Take by mouth daily.    Historical Provider, MD   BP 148/72 mmHg  Pulse 79  Temp(Src) 98.2 F (36.8 C) (Oral)  Resp 18  SpO2 99% Physical Exam  Constitutional: He is oriented to person, place, and time. He appears well-developed and well-nourished.  Musculoskeletal: Normal range of motion.       Arms: Neurological: He is alert and oriented to person, place, and time.  Skin: Skin is warm and dry.  Nursing note and vitals reviewed.   ED Course  LACERATION REPAIR Date/Time: 01/21/2014 5:48 PM Performed by: Billy Fischer Authorized by: Ihor Gully D Consent: Verbal consent obtained. Risks and benefits: risks, benefits and alternatives were discussed Consent given by: patient Body area: upper extremity Location details: left lower arm Laceration length: 5 cm Tendon involvement: none Nerve involvement: none Vascular damage: no Anesthesia: local infiltration Local anesthetic: lidocaine 2% without epinephrine Patient sedated: no  Preparation: Patient was prepped and draped in the usual sterile fashion. Irrigation solution: saline Amount of cleaning: standard Debridement: none Degree of undermining: none Skin closure: staples Number of sutures: 8 Technique: simple Approximation: close Approximation difficulty: simple Dressing: antibiotic ointment Patient tolerance: Patient tolerated the procedure well with no immediate complications   (including critical care time) Labs Review Labs Reviewed - No data to display  Imaging Review No results found.   MDM   1. Laceration of arm, left, initial  encounter        Billy Fischer, MD 01/21/14 1753

## 2014-01-21 NOTE — ED Notes (Signed)
Laceration to left forearm.  Bleeding controlled. Accidentally cut arm with a knife

## 2014-01-21 NOTE — Discharge Instructions (Signed)
Care as discussed, return 10 -14d for staple removal.

## 2014-02-02 ENCOUNTER — Emergency Department (INDEPENDENT_AMBULATORY_CARE_PROVIDER_SITE_OTHER)
Admission: EM | Admit: 2014-02-02 | Discharge: 2014-02-02 | Disposition: A | Discharge status: Home / Self Care | Payer: Medicare Other | Source: Home / Self Care | Attending: Family Medicine | Admitting: Family Medicine

## 2014-02-02 ENCOUNTER — Encounter (HOSPITAL_COMMUNITY): Payer: Self-pay | Admitting: Emergency Medicine

## 2014-02-02 DIAGNOSIS — Z4802 Encounter for removal of sutures: Secondary | ICD-10-CM | POA: Diagnosis not present

## 2014-02-02 NOTE — ED Provider Notes (Signed)
CSN: [id number]     Arrival date & time 02/02/14  9622 History   First MD Initiated Contact with Patient 02/02/14 [phone]     Chief Complaint  Patient presents with  . Follow-up  . Suture / Staple Removal   (Consider location/radiation/quality/duration/timing/severity/associated sxs/prior Treatment) Patient is a 59 y.o. male presenting with suture removal. The history is provided by the patient.  Suture / Staple Removal This is a new problem. The current episode started more than 1 week ago (seen 12/27 for left arm lac, 8 staples, no problems.). The problem has been rapidly improving.    Past Medical History  Diagnosis Date  . Stroke   . Kidney stone   . Hyperlipidemia    Past Surgical History  Procedure Laterality Date  . Back surgery      10 yrs ago or so-fusion in neck   . Colonoscopy      15-20 yrs ago-normal per pt.   . Teeth removal     Family History  Problem Relation Age of Onset  . Colon cancer Neg Hx   . Rectal cancer Neg Hx   . Stomach cancer Neg Hx   . Esophageal cancer Neg Hx    History  Substance Use Topics  . Smoking status: Current Every Day Smoker -- 1.00 packs/day    Types: Cigarettes  . Smokeless tobacco: Never Used  . Alcohol Use: No    Review of Systems  Constitutional: Negative.   Musculoskeletal: Negative.   Skin: Positive for wound.    Allergies  Review of patient's allergies indicates no known allergies.  Home Medications   Prior to Admission medications   Medication Sig Start Date End Date Taking? Authorizing Provider  ALPRAZolam [name]) 1 MG tablet Take 1 tablet (1 mg total) by mouth 3 (three) times daily as needed for anxiety. 10/16/13   [name], MD  amitriptyline (ELAVIL) 50 MG tablet Take 1 tablet (50 mg total) by mouth at bedtime. 10/16/13   [name], MD  aspirin 81 MG chewable tablet Chew by mouth daily.    Historical Provider, MD  B Complex Vitamins (VITAMIN B COMPLEX PO) Take by mouth daily.    Historical Provider,  MD  Cholecalciferol (VITAMIN D PO) Take by mouth daily.    Historical Provider, MD  lovastatin (MEVACOR) 20 MG tablet Take 1 tablet (20 mg total) by mouth at bedtime. 10/16/13   [name], MD  Thiamine HCl (VITAMIN B-1 PO) Take by mouth daily.    Historical Provider, MD   BP 131/69 mmHg  Pulse 78  Temp(Src) 98.1 F (36.7 C) (Oral)  Resp 16  SpO2 96% Physical Exam  Constitutional: He is oriented to person, place, and time. He appears well-developed and well-nourished.  Musculoskeletal:  Left volar wrist lac healed, staples removed., dsd.  Neurological: He is alert and oriented to person, place, and time.  Skin: Skin is warm and dry.  Nursing note and vitals reviewed.   ED Course  Procedures (including critical care time) Labs Review Labs Reviewed - No data to display  Imaging Review No results found.   MDM   1. Encounter for removal of staples        Billy Fischer, MD 02/02/14 [phone]

## 2014-02-02 NOTE — ED Notes (Signed)
Pt is here for follow up and staple removal.

## 2014-02-02 NOTE — Discharge Instructions (Signed)
Return as needed

## 2014-05-01 ENCOUNTER — Encounter: Payer: Self-pay | Admitting: *Deleted

## 2014-05-09 ENCOUNTER — Ambulatory Visit (INDEPENDENT_AMBULATORY_CARE_PROVIDER_SITE_OTHER): Payer: Medicare Other | Admitting: Family Medicine

## 2014-05-09 VITALS — BP 124/76 | HR 73 | Temp 97.6°F | Resp 16 | Ht 69.0 in | Wt 180.0 lb

## 2014-05-09 DIAGNOSIS — F411 Generalized anxiety disorder: Secondary | ICD-10-CM | POA: Diagnosis not present

## 2014-05-09 DIAGNOSIS — G4700 Insomnia, unspecified: Secondary | ICD-10-CM | POA: Diagnosis not present

## 2014-05-09 DIAGNOSIS — F17210 Nicotine dependence, cigarettes, uncomplicated: Secondary | ICD-10-CM

## 2014-05-09 DIAGNOSIS — E785 Hyperlipidemia, unspecified: Secondary | ICD-10-CM | POA: Diagnosis not present

## 2014-05-09 DIAGNOSIS — J0100 Acute maxillary sinusitis, unspecified: Secondary | ICD-10-CM | POA: Diagnosis not present

## 2014-05-09 DIAGNOSIS — M79671 Pain in right foot: Secondary | ICD-10-CM

## 2014-05-09 DIAGNOSIS — Z720 Tobacco use: Secondary | ICD-10-CM | POA: Diagnosis not present

## 2014-05-09 MED ORDER — LOVASTATIN 20 MG PO TABS: 20.0000 mg | ORAL_TABLET | Freq: Every day | ORAL | Status: DC

## 2014-05-09 MED ORDER — VARENICLINE TARTRATE 0.5 MG PO TABS: 0.5000 mg | ORAL_TABLET | Freq: Two times a day (BID) | ORAL | Status: DC

## 2014-05-09 MED ORDER — ALPRAZOLAM 1 MG PO TABS: 1.0000 mg | ORAL_TABLET | Freq: Three times a day (TID) | ORAL | Status: DC | PRN

## 2014-05-09 MED ORDER — AMITRIPTYLINE HCL 50 MG PO TABS: 50.0000 mg | ORAL_TABLET | Freq: Every day | ORAL | Status: DC

## 2014-05-09 MED ORDER — AMOXICILLIN 875 MG PO TABS: 875.0000 mg | ORAL_TABLET | Freq: Two times a day (BID) | ORAL | Status: DC

## 2014-05-09 NOTE — Progress Notes (Signed)
Patient ID: Roy Moore, male   DOB: April 17, 1955, 59 y.o.   MRN: [id number]   This chart was scribed for [name], MD by [facility], medical scribe at [facility].The patient was seen in exam room 14 and the patient's care was started at 5:07 PM.  Patient ID: Roy Moore MRN: [id number], DOB: May 08, 1955, 59 y.o. Date of Encounter: 05/09/2014  Primary Physician: [name], MD  Chief Complaint:  Chief Complaint  Patient presents with   Sinusitis   Annual Exam   HPI:  Roy Moore is a 59 y.o. male who presents to [facility] complaining of sinusitis. Pt states he ha fluid in both ears. He has occasional numbness in his hands and some weakness in his back due to a stroke. Pt states it takes time for him to get up full strength. Pt is retired and overall doing well, he has disability. Sister recently passed away from COPD. No other medical problems. Pt is currently smoking and wants help to quit smoking. He has not had a drink in 27 years. He needs all his medications refilled.  Past Medical History  Diagnosis Date   Stroke    Kidney stone    Hyperlipidemia     Home Meds: Prior to Admission medications   Medication Sig Start Date End Date Taking? Authorizing Provider  ALPRAZolam [name]) 1 MG tablet Take 1 tablet (1 mg total) by mouth 3 (three) times daily as needed for anxiety. 10/16/13  Yes [name], MD  amitriptyline (ELAVIL) 50 MG tablet Take 1 tablet (50 mg total) by mouth at bedtime. 10/16/13  Yes [name], MD  aspirin 81 MG chewable tablet Chew by mouth daily.   Yes Historical Provider, MD  B Complex Vitamins (VITAMIN B COMPLEX PO) Take by mouth daily.   Yes Historical Provider, MD  Cholecalciferol (VITAMIN D PO) Take by mouth daily.   Yes Historical Provider, MD  lovastatin (MEVACOR) 20 MG tablet Take 1 tablet (20 mg total) by mouth at bedtime. 10/16/13  Yes [name], MD  Thiamine HCl (VITAMIN  B-1 PO) Take by mouth daily.   Yes Historical Provider, MD    Allergies: No Known Allergies  History   Social History   Marital Status: Married    Spouse Name: [name]   Number of Children: [name]   Years of Education: [name]   Occupational History   Not on file.   Social History Main Topics   Smoking status: Current Every Day Smoker -- 1.00 packs/day    Types: Cigarettes   Smokeless tobacco: Never Used   Alcohol Use: No   Drug Use: No   Sexual Activity: Not on file   Other Topics Concern   Not on file   Social History Narrative    Review of Systems: Constitutional: negative for chills, fever, night sweats, weight changes, or fatigue  HEENT: negative for vision changes, hearing loss, congestion, rhinorrhea, ST, epistaxis, or sinus pressure. Positive for ear pain. Cardiovascular: negative for chest pain or palpitations Respiratory: negative for hemoptysis, wheezing, shortness of breath, or cough Abdominal: negative for abdominal pain, nausea, vomiting, diarrhea, or constipation Dermatological: negative for rash Neurologic: negative for headache, dizziness, or syncope All other systems reviewed and are otherwise negative with the exception to those above and in the HPI.  Physical Exam: Blood pressure 124/76, pulse 73, temperature 97.6 F (36.4 C), resp. rate 16, height 5\' 9"  (1.753 m), weight 180 lb (81.647 kg), SpO2 98 %.,  Body mass index is 26.57 kg/(m^2). General: Well developed, well nourished, in no acute distress. Head: Normocephalic, atraumatic, eyes without discharge, sclera non-icteric, nares are without discharge. Bilateral auditory canals clear, TM's are without perforation, pearly grey and translucent with reflective cone of light bilaterally. Oral cavity moist, posterior pharynx without exudate, erythema, peritonsillar abscess, or post nasal drip.edentulous  Neck: Supple. No thyromegaly. Full ROM. No lymphadenopathy. Lungs: Clear bilaterally to auscultation  without wheezes, rales, or rhonchi. Breathing is unlabored. Heart: RRR with S1 S2. No murmurs, rubs, or gallops appreciated. Abdomen: Soft, non-tender, non-distended with normoactive bowel sounds. No hepatomegaly. No rebound/guarding. No obvious abdominal masses. Msk:  Strength and tone normal for age. Sebaceous cyst in the right epigastrium  Extremities/Skin: Warm and dry. No clubbing or cyanosis. No edema. No rashes or suspicious lesions. Good strength and sensation in both arms.  Neuro: Alert and oriented X 3. Moves all extremities spontaneously. Gait is normal. CNII-XII grossly in tact. Good speech Psych:  Responds to questions appropriately with a normal affect.   Labs:  ASSESSMENT AND PLAN:  59 y.o. year old male with     This chart was scribed in my presence and reviewed by me personally.    ICD-9-CM ICD-10-CM   1. Hyperlipidemia 272.4 E78.5 Comprehensive metabolic panel     Lipid panel     lovastatin (MEVACOR) 20 MG tablet  2. Subacute maxillary sinusitis 461.0 J01.00 amoxicillin (AMOXIL) 875 MG tablet  3. Cigarette smoker 305.1 Z72.0 varenicline (CHANTIX) 0.5 MG tablet  4. Insomnia 780.52 G47.00 amitriptyline (ELAVIL) 50 MG tablet  5. Anxiety state 300.00 F41.1 ALPRAZolam (XANAX) 1 MG tablet  6. Heel pain, right 729.5 M79.671 Ambulatory referral to Podiatry    Signed, [name], MD 05/09/2014 5:14 PM

## 2014-05-09 NOTE — Patient Instructions (Addendum)
Sinusitis Sinusitis is redness, soreness, and inflammation of the paranasal sinuses. Paranasal sinuses are air pockets within the bones of your face (beneath the eyes, the middle of the forehead, or above the eyes). In healthy paranasal sinuses, mucus is able to drain out, and air is able to circulate through them by way of your nose. However, when your paranasal sinuses are inflamed, mucus and air can become trapped. This can allow bacteria and other germs to grow and cause infection. Sinusitis can develop quickly and last only a short time (acute) or continue over a long period (chronic). Sinusitis that lasts for more than 12 weeks is considered chronic.  CAUSES  Causes of sinusitis include:  Allergies.  Structural abnormalities, such as displacement of the cartilage that separates your nostrils (deviated septum), which can decrease the air flow through your nose and sinuses and affect sinus drainage.  Functional abnormalities, such as when the small hairs (cilia) that line your sinuses and help remove mucus do not work properly or are not present. SIGNS AND SYMPTOMS  Symptoms of acute and chronic sinusitis are the same. The primary symptoms are pain and pressure around the affected sinuses. Other symptoms include:  Upper toothache.  Earache.  Headache.  Bad breath.  Decreased sense of smell and taste.  A cough, which worsens when you are lying flat.  Fatigue.  Fever.  Thick drainage from your nose, which often is green and may contain pus (purulent).  Swelling and warmth over the affected sinuses. DIAGNOSIS  Your health care provider will perform a physical exam. During the exam, your health care provider may:  Look in your nose for signs of abnormal growths in your nostrils (nasal polyps).  Tap over the affected sinus to check for signs of infection.  View the inside of your sinuses (endoscopy) using an imaging device that has a light attached (endoscope). If your health  care provider suspects that you have chronic sinusitis, one or more of the following tests may be recommended:  Allergy tests.  Nasal culture. A sample of mucus is taken from your nose, sent to a lab, and screened for bacteria.  Nasal cytology. A sample of mucus is taken from your nose and examined by your health care provider to determine if your sinusitis is related to an allergy. TREATMENT  Most cases of acute sinusitis are related to a viral infection and will resolve on their own within 10 days. Sometimes medicines are prescribed to help relieve symptoms (pain medicine, decongestants, nasal steroid sprays, or saline sprays).  However, for sinusitis related to a bacterial infection, your health care provider will prescribe antibiotic medicines. These are medicines that will help kill the bacteria causing the infection.  Rarely, sinusitis is caused by a fungal infection. In theses cases, your health care provider will prescribe antifungal medicine. For some cases of chronic sinusitis, surgery is needed. Generally, these are cases in which sinusitis recurs more than 3 times per year, despite other treatments. HOME CARE INSTRUCTIONS   Drink plenty of water. Water helps thin the mucus so your sinuses can drain more easily.  Use a humidifier.  Inhale steam 3 to 4 times a day (for example, sit in the bathroom with the shower running).  Apply a warm, moist washcloth to your face 3 to 4 times a day, or as directed by your health care provider.  Use saline nasal sprays to help moisten and clean your sinuses.  Take medicines only as directed by your health care provider.    If you were prescribed either an antibiotic or antifungal medicine, finish it all even if you start to feel better. SEEK IMMEDIATE MEDICAL CARE IF:  You have increasing pain or severe headaches.  You have nausea, vomiting, or drowsiness.  You have swelling around your face.  You have vision problems.  You have a stiff  neck.  You have difficulty breathing. MAKE SURE YOU:   Understand these instructions.  Will watch your condition.  Will get help right away if you are not doing well or get worse. Document Released: 01/12/2005 Document Revised: 05/29/2013 Document Reviewed: 01/27/2011 [facility] Patient Information 2015 [place], [place]. This information is not intended to replace advice given to you by your health care provider. Make sure you discuss any questions you have with your health care provider. Varenicline oral tablets What is this medicine? VARENICLINE (var EN i kleen) is used to help people quit smoking. It can reduce the symptoms caused by stopping smoking. It is used with a patient support program recommended by your physician. This medicine may be used for other purposes; ask your health care provider or pharmacist if you have questions. COMMON BRAND NAME(S): Chantix What should I tell my health care provider before I take this medicine? They need to know if you have any of these conditions: -bipolar disorder, depression, schizophrenia or other mental illness -heart disease -if you often drink alcohol -kidney disease -peripheral vascular disease -seizures -stroke -suicidal thoughts, plans, or attempt; a previous suicide attempt by you or a family member -an unusual or allergic reaction to varenicline, other medicines, foods, dyes, or preservatives -pregnant or trying to get pregnant -breast-feeding How should I use this medicine? You should set a date to stop smoking and tell your doctor. Start this medicine one week before the quit date. You can also start taking this medicine before you choose a quit date, and then pick a quit date that is between 8 and 35 days of treatment with this medicine. Stick to your plan; ask about support groups or other ways to help you remain a 'quitter'. Take this medicine by mouth after eating. Take with a full glass of water. Follow the directions on the  prescription label. Take your doses at regular intervals. Do not take your medicine more often than directed. A special MedGuide will be given to you by the pharmacist with each prescription and refill. Be sure to read this information carefully each time. Talk to your pediatrician regarding the use of this medicine in children. This medicine is not approved for use in children. Overdosage: If you think you have taken too much of this medicine contact a poison control center or emergency room at once. NOTE: This medicine is only for you. Do not share this medicine with others. What if I miss a dose? If you miss a dose, take it as soon as you can. If it is almost time for your next dose, take only that dose. Do not take double or extra doses. What may interact with this medicine? -alcohol or any product that contains alcohol -insulin -other stop smoking aids -theophylline -warfarin This list may not describe all possible interactions. Give your health care provider a list of all the medicines, herbs, non-prescription drugs, or dietary supplements you use. Also tell them if you smoke, drink alcohol, or use illegal drugs. Some items may interact with your medicine. What should I watch for while using this medicine? Visit your doctor or health care professional for regular check ups. Ask for  ongoing advice and encouragement from your doctor or healthcare professional, friends, and family to help you quit. If you smoke while on this medication, quit again Your mouth may get dry. Chewing sugarless gum or sucking hard candy, and drinking plenty of water may help. Contact your doctor if the problem does not go away or is severe. You may get drowsy or dizzy. Do not drive, use machinery, or do anything that needs mental alertness until you know how this medicine affects you. Do not stand or sit up quickly, especially if you are an older patient. This reduces the risk of dizzy or fainting spells. The use of  this medicine may increase the chance of suicidal thoughts or actions. Pay special attention to how you are responding while on this medicine. Any worsening of mood, or thoughts of suicide or dying should be reported to your health care professional right away. What side effects may I notice from receiving this medicine? Side effects that you should report to your doctor or health care professional as soon as possible: -allergic reactions like skin rash, itching or hives, swelling of the face, lips, tongue, or throat -breathing problems -changes in vision -chest pain or chest tightness -confusion, trouble speaking or understanding -fast, irregular heartbeat -feeling faint or lightheaded, falls -fever -pain in legs when walking -problems with balance, talking, walking -redness, blistering, peeling or loosening of the skin, including inside the mouth -ringing in ears -seizures -sudden numbness or weakness of the face, arm or leg -suicidal thoughts or other mood changes -trouble passing urine or change in the amount of urine -unusual bleeding or bruising -unusually weak or tired Side effects that usually do not require medical attention (report to your doctor or health care professional if they continue or are bothersome): -constipation -headache -nausea, vomiting -strange dreams -stomach gas -trouble sleeping This list may not describe all possible side effects. Call your doctor for medical advice about side effects. You may report side effects to FDA at 1-800-FDA-1088. Where should I keep my medicine? Keep out of the reach of children. Store at room temperature between 15 and 30 degrees C (59 and 86 degrees F). Throw away any unused medicine after the expiration date. NOTE: This sheet is a summary. It may not cover all possible information. If you have questions about this medicine, talk to your doctor, pharmacist, or health care provider.  2015, Elsevier/[employer] Standard. (2012-10-24  13:37:47)

## 2014-05-10 LAB — COMPREHENSIVE METABOLIC PANEL
ALT: 8 U/L (ref 0–53)
AST: 14 U/L (ref 0–37)
Albumin: 4.2 g/dL (ref 3.5–5.2)
Alkaline Phosphatase: 48 U/L (ref 39–117)
BUN: 17 mg/dL (ref 6–23)
CO2: 29 mEq/L (ref 19–32)
Calcium: 9.3 mg/dL (ref 8.4–10.5)
Chloride: 101 mEq/L (ref 96–112)
Creat: 1.22 mg/dL (ref 0.50–1.35)
Glucose, Bld: 89 mg/dL (ref 70–99)
Potassium: 4.3 mEq/L (ref 3.5–5.3)
Sodium: 138 mEq/L (ref 135–145)
Total Bilirubin: 0.4 mg/dL (ref 0.2–1.2)
Total Protein: 6.6 g/dL (ref 6.0–8.3)

## 2014-05-10 LAB — LIPID PANEL
Cholesterol: 144 mg/dL (ref 0–200)
HDL: 60 mg/dL (ref >=40)
LDL Cholesterol: 70 mg/dL (ref 0–99)
Total CHOL/HDL Ratio: 2.4 Ratio
Triglycerides: 72 mg/dL (ref <150)
VLDL: 14 mg/dL (ref 0–40)

## 2014-05-22 ENCOUNTER — Encounter: Payer: Self-pay | Admitting: Podiatry

## 2014-05-22 ENCOUNTER — Ambulatory Visit: Payer: Self-pay

## 2014-05-22 ENCOUNTER — Ambulatory Visit (INDEPENDENT_AMBULATORY_CARE_PROVIDER_SITE_OTHER): Payer: Medicare Other | Admitting: Podiatry

## 2014-05-22 VITALS — BP 140/73 | HR 77 | Resp 16

## 2014-05-22 DIAGNOSIS — M722 Plantar fascial fibromatosis: Secondary | ICD-10-CM

## 2014-05-22 DIAGNOSIS — Q828 Other specified congenital malformations of skin: Secondary | ICD-10-CM | POA: Diagnosis not present

## 2014-05-22 NOTE — Patient Instructions (Signed)

## 2014-05-22 NOTE — Progress Notes (Signed)
   Subjective:    Patient ID: Roy Moore, male    DOB: 01-Jun-1955, 59 y.o.   MRN: [id number]  HPI Comments: "I have a place on my foot"  Patient c/o tender, callused area plantar heel right for about 3 years. He has been trimming it with his Ped-egg. PCP has removed it several times and keeps coming back. Very sore to walk.    Foot Pain      Review of Systems  HENT: Positive for sinus pressure.   All other systems reviewed and are negative.      Objective:   Physical Exam: I have reviewed his past mental history medications allergy surgery social history and review of systems. Pulses are strongly palpable bilateral. Neurologic sensorium is intact percent lusty monofilament. Deep tendon reflexes are intact bilateral and muscle strength is 5 over 5 dorsiflexion plantar flexors and inverters everters all to the musculature is intact. Orthopedic evaluation demonstrates all joints distal to the ankle for range of motion without crepitation. Cutaneous evaluation demonstrates supple well-hydrated cutis with exception of a solitary poor keratoma to the plantar medial aspect of the right heel pad. I see no signs of a foreign body or infection.        Assessment & Plan:  Assessment porokeratosis. Soft tissue lesion left heel.  Plan: Surgical debridement today as well as chemical debridement under occlusion to be washed off in the next 2 days I will follow-up with him an approximate 4-6 weeks for repeat treatment.

## 2014-07-03 ENCOUNTER — Ambulatory Visit: Payer: Medicare Other | Admitting: Podiatry

## 2014-07-10 ENCOUNTER — Ambulatory Visit: Payer: Medicare Other | Admitting: Podiatry

## 2014-07-31 ENCOUNTER — Ambulatory Visit (INDEPENDENT_AMBULATORY_CARE_PROVIDER_SITE_OTHER): Payer: Medicare Other | Admitting: Podiatry

## 2014-07-31 ENCOUNTER — Encounter: Payer: Self-pay | Admitting: Podiatry

## 2014-07-31 VITALS — BP 117/63 | HR 80 | Resp 16

## 2014-07-31 DIAGNOSIS — Q828 Other specified congenital malformations of skin: Secondary | ICD-10-CM

## 2014-07-31 NOTE — Progress Notes (Signed)
He presents today for follow-up of his porokeratosis plantar aspect of the right foot. He states that he is doing much better.  Objective: Vital signs are stable he is alert and oriented 3 pulses are palpable right foot. Porokeratotic lesion plantar lateral aspect of the right heel does demonstrate a small porokeratotic lesion that appears to be healing.  Assessment: Porokeratosis right foot. Resolving.  Plan: Mechanical and chemical debridement of lesion today and will follow up with him as needed.

## 2014-11-05 ENCOUNTER — Ambulatory Visit (INDEPENDENT_AMBULATORY_CARE_PROVIDER_SITE_OTHER): Payer: Medicare Other | Admitting: Family Medicine

## 2014-11-05 VITALS — BP 166/81 | HR 83 | Temp 98.8°F | Resp 18 | Ht 69.0 in | Wt 182.1 lb

## 2014-11-05 DIAGNOSIS — G4700 Insomnia, unspecified: Secondary | ICD-10-CM

## 2014-11-05 DIAGNOSIS — E785 Hyperlipidemia, unspecified: Secondary | ICD-10-CM

## 2014-11-05 DIAGNOSIS — F411 Generalized anxiety disorder: Secondary | ICD-10-CM | POA: Diagnosis not present

## 2014-11-05 DIAGNOSIS — M7521 Bicipital tendinitis, right shoulder: Secondary | ICD-10-CM

## 2014-11-05 MED ORDER — PREDNISONE 20 MG PO TABS: ORAL_TABLET | ORAL | Status: DC

## 2014-11-05 MED ORDER — ALPRAZOLAM 1 MG PO TABS: 1.0000 mg | ORAL_TABLET | Freq: Three times a day (TID) | ORAL | Status: DC | PRN

## 2014-11-05 MED ORDER — HYDROCODONE-ACETAMINOPHEN 5-325 MG PO TABS: 1.0000 | ORAL_TABLET | Freq: Four times a day (QID) | ORAL | Status: DC | PRN

## 2014-11-05 MED ORDER — LOVASTATIN 20 MG PO TABS: 20.0000 mg | ORAL_TABLET | Freq: Every day | ORAL | Status: DC

## 2014-11-05 MED ORDER — AMITRIPTYLINE HCL 50 MG PO TABS: 50.0000 mg | ORAL_TABLET | Freq: Every day | ORAL | Status: DC

## 2014-11-05 NOTE — Patient Instructions (Signed)
Let me know if the pain in the shoulders is not significantly improve over the next 48-72 hours.

## 2014-11-05 NOTE — Progress Notes (Signed)
° °  Subjective:    Patient ID: Roy Moore, male    DOB: [birth year], 59 y.o.   MRN: [id number] This chart was scribed for [name], MD by Marti Sleigh, Medical Scribe. This patient was seen in Room 1 and the patient's care was started a 7:55 PM.  Chief Complaint  Patient presents with   Arm Pain    right arm & shoulder pain x about 2 months after trying to lift wife after her stroke    HPI HPI Comments: Roy Moore is a 59 y.o. male who presents to [facility] complaining of an acute exacerbation of intermittent right shoulder pain with associated weakness secondary to pain for the last two months. He states he has had this sx intermittently for the last two months he caught his wife, Roy Moore, when she fell during a stroke.  Patient never filled his Chantix prescription. He read the side effect profile and noticed that it can cause homicidal and suicidal behavior. We reiterated the importance of quitting smoking. Talked about his past problem with alcohol and will try again to quit smoking   Review of Systems  Constitutional: Negative for fever and chills.  Musculoskeletal: Positive for joint swelling and arthralgias.  Neurological: Positive for weakness (Secondary to pain). Negative for numbness.       Objective:   Physical Exam  Constitutional: He is oriented to person, place, and time. He appears well-developed and well-nourished. No distress.  HENT:  Head: Normocephalic and atraumatic.  Eyes: Pupils are equal, round, and reactive to light.  Neck: Neck supple.  Cardiovascular: Normal rate.   Pulmonary/Chest: Effort normal. No respiratory distress.  Musculoskeletal: Normal range of motion.  Tender diffusely around the right shoulder joint line.  Neurological: He is alert and oriented to person, place, and time. Coordination normal.  Skin: Skin is warm and dry. He is not diaphoretic.  Psychiatric: He has a normal mood and affect. His behavior is normal.  Nursing note and  vitals reviewed.  After informed consent, the anterior joint line of the right shoulder was prepped with alcohol and then injected with 1 mL of Depo-Medrol and 1 mL of Marcaine 0.5%. There were no complications and the patient tolerated the procedure well. He did not have significant relief of his pain, however area Filed Vitals:   11/05/14 1937  BP: 166/81  Pulse: 83  Temp: 98.8 F (37.1 C)  TempSrc: Oral  Resp: 18  Height: 5\' 9"  (1.753 m)  Weight: 182 lb 2 oz (82.611 kg)  SpO2: 98%       Assessment & Plan:    This chart was scribed in my presence and reviewed by me personally.    ICD-9-CM ICD-10-CM   1. Biceps tendonitis on right 726.12 M75.21 HYDROcodone-acetaminophen (NORCO) 5-325 MG tablet     predniSONE (DELTASONE) 20 MG tablet  2. Hyperlipidemia 272.4 E78.5 lovastatin (MEVACOR) 20 MG tablet  3. Anxiety state 300.00 F41.1 ALPRAZolam (XANAX) 1 MG tablet  4. Insomnia 780.52 G47.00 amitriptyline (ELAVIL) 50 MG tablet     Signed, [name], MD   By signing my name below, I, Judithe Modest, attest that this documentation has been prepared under the direction and in the presence of [name], MD. Electronically Signed: Judithe Modest, ER Scribe. 11/05/2014. 7:55 PM.

## 2015-01-02 ENCOUNTER — Telehealth: Payer: Self-pay

## 2015-01-02 ENCOUNTER — Emergency Department (HOSPITAL_COMMUNITY)
Admission: EM | Admit: 2015-01-02 | Discharge: 2015-01-02 | Disposition: A | Discharge status: Home / Self Care | Payer: Commercial Managed Care - HMO | Attending: Emergency Medicine | Admitting: Emergency Medicine

## 2015-01-02 ENCOUNTER — Encounter (HOSPITAL_COMMUNITY): Payer: Self-pay | Admitting: Emergency Medicine

## 2015-01-02 DIAGNOSIS — M5430 Sciatica, unspecified side: Secondary | ICD-10-CM | POA: Insufficient documentation

## 2015-01-02 DIAGNOSIS — E785 Hyperlipidemia, unspecified: Secondary | ICD-10-CM | POA: Diagnosis not present

## 2015-01-02 DIAGNOSIS — Z7952 Long term (current) use of systemic steroids: Secondary | ICD-10-CM | POA: Insufficient documentation

## 2015-01-02 DIAGNOSIS — F17210 Nicotine dependence, cigarettes, uncomplicated: Secondary | ICD-10-CM | POA: Diagnosis not present

## 2015-01-02 DIAGNOSIS — Z87442 Personal history of urinary calculi: Secondary | ICD-10-CM | POA: Insufficient documentation

## 2015-01-02 DIAGNOSIS — Z7982 Long term (current) use of aspirin: Secondary | ICD-10-CM | POA: Insufficient documentation

## 2015-01-02 DIAGNOSIS — Z8673 Personal history of transient ischemic attack (TIA), and cerebral infarction without residual deficits: Secondary | ICD-10-CM | POA: Diagnosis not present

## 2015-01-02 DIAGNOSIS — R110 Nausea: Secondary | ICD-10-CM | POA: Insufficient documentation

## 2015-01-02 DIAGNOSIS — M5432 Sciatica, left side: Secondary | ICD-10-CM | POA: Diagnosis not present

## 2015-01-02 DIAGNOSIS — Z79899 Other long term (current) drug therapy: Secondary | ICD-10-CM | POA: Diagnosis not present

## 2015-01-02 DIAGNOSIS — M79605 Pain in left leg: Secondary | ICD-10-CM | POA: Diagnosis not present

## 2015-01-02 MED ORDER — HYDROMORPHONE HCL 1 MG/ML IJ SOLN: 1.0000 mg | Freq: Once | INTRAMUSCULAR | Status: DC

## 2015-01-02 MED ORDER — HYDROCODONE-ACETAMINOPHEN 5-325 MG PO TABS: 1.0000 | ORAL_TABLET | Freq: Two times a day (BID) | ORAL | Status: DC | PRN

## 2015-01-02 MED ORDER — HYDROMORPHONE HCL 1 MG/ML IJ SOLN
1.0000 mg | INTRAMUSCULAR | Status: DC | PRN
  Administered 2015-01-02: 1 mg via INTRAVENOUS
  Filled 2015-01-02: qty 1

## 2015-01-02 MED ORDER — KETOROLAC TROMETHAMINE 30 MG/ML IJ SOLN
30.0000 mg | Freq: Once | INTRAMUSCULAR | Status: AC
  Administered 2015-01-02: 30 mg via INTRAVENOUS
  Filled 2015-01-02: qty 1

## 2015-01-02 MED ORDER — HYDROMORPHONE HCL 1 MG/ML IJ SOLN
1.0000 mg | Freq: Once | INTRAMUSCULAR | Status: AC
  Administered 2015-01-02: 1 mg via INTRAVENOUS
  Filled 2015-01-02: qty 1

## 2015-01-02 NOTE — ED Notes (Signed)
Pt uncomfortable on stretcher.  Requested to sit in chair, requested to have monitors removed and spot checked when needed.  Dr. Claudine Mouton notified

## 2015-01-02 NOTE — ED Provider Notes (Signed)
CSN: [id number]     Arrival date & time 01/02/15  0057 History  By signing my name below, I, Arianna Nassar, attest that this documentation has been prepared under the direction and in the presence of Everlene Balls, MD. Electronically Signed: Julien Nordmann, ED Scribe. 01/02/2015. 1:25 AM.     Chief Complaint  Patient presents with  . Leg Pain      The history is provided by the patient. No language interpreter was used.   HPI Comments: Roy Moore is a 59 y.o. male who presents to the Emergency Department complaining of constant, severe, gradual worsening left leg pain with onset this morning. He notes having associated nausea secondary to pain. Pt states the pain radiates from his lower back down his left leg. He states he is unable to stand or apply pressure to his left leg without severe pain. Pt took some hydrocodone to alleviate the pain with no relief. Pt denies bladder/bowel incontinence, hx of cancer, hx of IV drug use, abdominal pain, and injury to his back.  Past Medical History  Diagnosis Date  . Stroke ([place])   . Kidney stone   . Hyperlipidemia    Past Surgical History  Procedure Laterality Date  . Back surgery      10 yrs ago or so-fusion in neck   . Colonoscopy      15-20 yrs ago-normal per pt.   . Teeth removal     Family History  Problem Relation Age of Onset  . Colon cancer Neg Hx   . Rectal cancer Neg Hx   . Stomach cancer Neg Hx   . Esophageal cancer Neg Hx    Social History  Substance Use Topics  . Smoking status: Current Every Day Smoker -- 1.00 packs/day    Types: Cigarettes  . Smokeless tobacco: Never Used  . Alcohol Use: No    Review of Systems   A complete 10 system review of systems was obtained and all systems are negative except as noted in the HPI and PMH.    Allergies  Review of patient's allergies indicates no known allergies.  Home Medications   Prior to Admission medications   Medication Sig Start Date End Date Taking?  Authorizing Provider  ALPRAZolam [name]) 1 MG tablet Take 1 tablet (1 mg total) by mouth 3 (three) times daily as needed for anxiety. 11/05/14   [name], MD  amitriptyline (ELAVIL) 50 MG tablet Take 1 tablet (50 mg total) by mouth at bedtime. 11/05/14   [name], MD  aspirin 81 MG chewable tablet Chew by mouth daily.    Historical Provider, MD  B Complex Vitamins (VITAMIN B COMPLEX PO) Take by mouth daily.    Historical Provider, MD  Cholecalciferol (VITAMIN D PO) Take by mouth daily.    Historical Provider, MD  HYDROcodone-acetaminophen (NORCO) 5-325 MG tablet Take 1 tablet by mouth every 6 (six) hours as needed for moderate pain. 11/05/14   [name], MD  lovastatin (MEVACOR) 20 MG tablet Take 1 tablet (20 mg total) by mouth at bedtime. 11/05/14   [name], MD  predniSONE (DELTASONE) 20 MG tablet Two daily with food 11/05/14   [name], MD  Thiamine HCl (VITAMIN B-1 PO) Take by mouth daily.    Historical Provider, MD  varenicline (CHANTIX) 0.5 MG tablet Take 1 tablet (0.5 mg total) by mouth 2 (two) times daily. Patient not taking: Reported on 11/05/2014 05/09/14   [name], MD   Triage vitals: BP 183/83 mmHg  Pulse 99  Temp(Src) 98.5 F (36.9 C) (Oral)  Resp 16  Ht 5\' 8"  (1.727 m)  Wt 170 lb (77.111 kg)  BMI 25.85 kg/m2  SpO2 98% Physical Exam  Constitutional: He is oriented to person, place, and time. Vital signs are normal. He appears well-developed and well-nourished.  Non-toxic appearance. He does not appear ill. No distress.  HENT:  Head: Normocephalic and atraumatic.  Nose: Nose normal.  Mouth/Throat: Oropharynx is clear and moist. No oropharyngeal exudate.  Eyes: Conjunctivae and EOM are normal. Pupils are equal, round, and reactive to light. No scleral icterus.  Neck: Normal range of motion. Neck supple. No tracheal deviation, no edema, no erythema and normal range of motion present. No thyroid mass and no thyromegaly present.   Cardiovascular: Normal rate, regular rhythm, S1 normal, S2 normal, normal heart sounds, intact distal pulses and normal pulses.  Exam reveals no gallop and no friction rub.   No murmur heard. Pulmonary/Chest: Effort normal and breath sounds normal. No respiratory distress. He has no wheezes. He has no rhonchi. He has no rales.  Abdominal: Soft. Normal appearance and bowel sounds are normal. He exhibits no distension, no ascites and no mass. There is no hepatosplenomegaly. There is no tenderness. There is no rebound, no guarding and no CVA tenderness.  Musculoskeletal: Normal range of motion. He exhibits no edema or tenderness.  Left sided low back pain and positive straight leg raise test bilaterally  Lymphadenopathy:    He has no cervical adenopathy.  Neurological: He is alert and oriented to person, place, and time. He has normal strength. No cranial nerve deficit or sensory deficit.  Skin: Skin is warm, dry and intact. No petechiae and no rash noted. He is not diaphoretic. No erythema. No pallor.  Psychiatric: He has a normal mood and affect. His behavior is normal. Judgment normal.  Nursing note and vitals reviewed.   ED Course  Procedures  DIAGNOSTIC STUDIES: Oxygen Saturation is 98% on RA, normal by my interpretation.  COORDINATION OF CARE:  1:24 AM Discussed treatment plan which includes pain medication with pt at bedside and pt agreed to plan.  Labs Review Labs Reviewed - No data to display  Imaging Review No results found. I have personally reviewed and evaluated these images and lab results as part of my medical decision-making.   EKG Interpretation None      MDM   Final diagnoses:  None    Patient presents to emergency for her back pain radiating down his leg. His exam is consistent with sciatica. He was given Dilaudid and Toradol for pain control. We'll discharge home with Norco to take as needed. Primary care follow-up encouraged. He appears well in no acute  distress, vital signs within his normal limits and is safe for discharge. Tachycardia has resolved upon repeat evaluation.     I personally performed the services described in this documentation, which was scribed in my presence. The recorded information has been reviewed and is accurate.     Everlene Balls, MD 01/02/15 [phone]

## 2015-01-02 NOTE — ED Notes (Signed)
Pt verbalized understanding of d/c instructions, prescriptions, and follow-up care. No further questions/concerns, VSS, assisted to lobby in wheelchair.  

## 2015-01-02 NOTE — Telephone Encounter (Signed)
Pt states her husband was in the hospital diagnosed with something wrong with the back and leg, had already seen Dr Synetta Shadow the last time and she think it was the same thing when he was seen by our Dr the last time. Will try and come in and see Dr Synetta Shadow on Thursday

## 2015-01-02 NOTE — ED Notes (Signed)
Pt arrived via EMS c/o left leg pain that started this morning.  Pt denies any injuries.  Pt refuses to stand or apply weight to left leg.

## 2015-01-02 NOTE — Discharge Instructions (Signed)
Sciatica Mr. Ganas, your pain is likely from your sciatica.  Take ibuprofen or tylenol as needed for pain.  If your pain becomes severe take norco.  See a primary care doctor within 3 days for close follow up.  If symptoms worsen, come back to the ED immediately. Thank you. Sciatica is pain, weakness, numbness, or tingling along your sciatic nerve. The nerve starts in the lower back and runs down the back of each leg. Nerve damage or certain conditions pinch or put pressure on the sciatic nerve. This causes the pain, weakness, and other discomforts of sciatica. HOME CARE   Only take medicine as told by your doctor.  Apply ice to the affected area for 20 minutes. Do this 3-4 times a day for the first 48-72 hours. Then try heat in the same way.  Exercise, stretch, or do your usual activities if these do not make your pain worse.  Go to physical therapy as told by your doctor.  Keep all doctor visits as told.  Do not wear high heels or shoes that are not supportive.  Get a firm mattress if your mattress is too soft to lessen pain and discomfort. GET HELP RIGHT AWAY IF:   You cannot control when you poop (bowel movement) or pee (urinate).  You have more weakness in your lower back, lower belly (pelvis), butt (buttocks), or legs.  You have redness or puffiness (swelling) of your back.  You have a burning feeling when you pee.  You have pain that gets worse when you lie down.  You have pain that wakes you from your sleep.  Your pain is worse than past pain.  Your pain lasts longer than 4 weeks.  You are suddenly losing weight without reason. MAKE SURE YOU:   Understand these instructions.  Will watch this condition.  Will get help right away if you are not doing well or get worse.   This information is not intended to replace advice given to you by your health care provider. Make sure you discuss any questions you have with your health care provider.   Document Released:  10/22/2007 Document Revised: 10/03/2014 Document Reviewed: 05/24/2011 Elsevier Interactive Patient Education [employer].

## 2015-01-03 ENCOUNTER — Ambulatory Visit (INDEPENDENT_AMBULATORY_CARE_PROVIDER_SITE_OTHER): Payer: Commercial Managed Care - HMO | Admitting: Family Medicine

## 2015-01-03 VITALS — BP 144/94 | HR 116 | Temp 99.2°F | Resp 17 | Ht 69.5 in | Wt 172.0 lb

## 2015-01-03 DIAGNOSIS — M5432 Sciatica, left side: Secondary | ICD-10-CM

## 2015-01-03 DIAGNOSIS — H6063 Unspecified chronic otitis externa, bilateral: Secondary | ICD-10-CM | POA: Diagnosis not present

## 2015-01-03 LAB — POC MICROSCOPIC URINALYSIS (UMFC): Mucus: ABSENT

## 2015-01-03 LAB — POCT URINALYSIS DIP (MANUAL ENTRY)
Glucose, UA: NEGATIVE
Leukocytes, UA: NEGATIVE
Nitrite, UA: NEGATIVE
Protein Ur, POC: 30 — AB
Spec Grav, UA: 1.015
Urobilinogen, UA: 0.2
pH, UA: 5.5

## 2015-01-03 MED ORDER — OXYCODONE-ACETAMINOPHEN 10-325 MG PO TABS: 1.0000 | ORAL_TABLET | Freq: Three times a day (TID) | ORAL | Status: DC | PRN

## 2015-01-03 MED ORDER — PREDNISONE 20 MG PO TABS: ORAL_TABLET | ORAL | Status: DC

## 2015-01-03 NOTE — Patient Instructions (Signed)

## 2015-01-03 NOTE — Addendum Note (Signed)
Addended by: [name] on: 01/03/2015 04:24 PM   Modules accepted: Orders

## 2015-01-03 NOTE — Progress Notes (Addendum)
Subjective:    Patient ID: Roy Moore, male    DOB: [birth year], 59 y.o.   MRN: [id number] By signing my name below, I, Zola Button, attest that this documentation has been prepared under the direction and in the presence of [name], MD.  Electronically Signed: Zola Button, Medical Scribe. 01/03/2015. 3:09 PM.  HPI HPI Comments: Roy Moore is a 59 y.o. male with a history of tobacco abuse and stroke with left hemiparesis who presents to the [facility] for a hospital follow-up. Patient was seen in the ED yesterday for severe left leg pain that started the night prior, which was diagnosed as sciatic leg pain. The pain was severe to the point that he called EMS to bring him to the hospital. Patient states he received medications, but did not have any lab tests or imaging done. He still has pain and has been having difficulty ambulating and standing up due to the pain. Patient denies hematuria, fever, and urinary or bowel problems. However, he notes that he has been feeling warmer than usual. He denies known injury. He does have a PSHx of back surgery.  Patient also reports having sharp pain to the sides of his neck bilaterally. He also feels as if he has fluid in his right ear.  Past Surgical History  Procedure Laterality Date  . Back surgery      10 yrs ago or so-fusion in neck   . Colonoscopy      15-20 yrs ago-normal per pt.   . Teeth removal       Review of Systems  Constitutional: Negative for fever.  Gastrointestinal: Negative for diarrhea and constipation.  Genitourinary: Negative.  Negative for hematuria.  Musculoskeletal: Positive for myalgias.       Objective:   Physical Exam CONSTITUTIONAL: Well developed/well nourished HEAD: Normocephalic/atraumatic EYES: EOM/PERRL ENMT: Mucous membranes moist; opaque thickened TMs bilaterally with clear ear canals NECK: supple no meningeal signs SPINE: entire spine nontender CV: S1/S2 noted, no  murmurs/rubs/gallops noted LUNGS: Lungs are clear to auscultation bilaterally, no apparent distress ABDOMEN: soft, nontender, no rebound or guarding GU: no cva tenderness NEURO: Pt is awake/alert, moves all extremitiesx4 EXTREMITIES: pulses normal, full ROM; pain with left straight leg raising SKIN: warm, color normal PSYCH: no abnormalities of mood noted  Results for orders placed or performed in visit on 01/03/15  POCT urinalysis dipstick  Result Value Ref Range   Color, UA yellow yellow   Clarity, UA clear clear   Glucose, UA negative negative   Bilirubin, UA small (A) negative   Ketones, POC UA small (15) (A) negative   Spec Grav, UA 1.015    Blood, UA trace-lysed (A) negative   pH, UA 5.5    Protein Ur, POC =30 (A) negative   Urobilinogen, UA 0.2    Nitrite, UA Negative Negative   Leukocytes, UA Negative Negative  POCT Microscopic Urinalysis ([facility])  Result Value Ref Range   WBC,UR,HPF,POC None None WBC/hpf   RBC,UR,HPF,POC Few (A) None RBC/hpf   Bacteria Few (A) None, Too numerous to count   Mucus Absent Absent   Epithelial Cells, UR Per Microscopy Few (A) None, Too numerous to count cells/hpf       Assessment & Plan:   This chart was scribed in my presence and reviewed by me personally.    ICD-9-CM ICD-10-CM   1. Sciatica of left side 724.3 M54.32 POCT urinalysis dipstick     POCT Microscopic Urinalysis ([facility])  predniSONE (DELTASONE) 20 MG tablet     oxyCODONE-acetaminophen (PERCOCET) 10-325 MG tablet  2. Chronic otitis externa, bilateral 380.23 H60.63      Signed, [name], MD

## 2015-01-03 NOTE — Telephone Encounter (Signed)
Patient came to be seen

## 2015-01-05 ENCOUNTER — Telehealth: Payer: Self-pay

## 2015-01-05 NOTE — Telephone Encounter (Signed)
Pt wanted to let dr Jacinto Reap know that he was doing much better from his Wednesday visit  Best number [phone]

## 2015-01-06 NOTE — Telephone Encounter (Signed)
Great. Dr L advised.

## 2015-01-31 ENCOUNTER — Other Ambulatory Visit: Payer: Self-pay | Admitting: Family Medicine

## 2015-02-01 ENCOUNTER — Ambulatory Visit (INDEPENDENT_AMBULATORY_CARE_PROVIDER_SITE_OTHER): Payer: Commercial Managed Care - HMO | Admitting: Internal Medicine

## 2015-02-01 VITALS — BP 140/70 | HR 90 | Temp 97.5°F | Resp 18 | Ht 71.0 in | Wt 182.0 lb

## 2015-02-01 DIAGNOSIS — M542 Cervicalgia: Secondary | ICD-10-CM

## 2015-02-01 DIAGNOSIS — M25512 Pain in left shoulder: Secondary | ICD-10-CM

## 2015-02-01 MED ORDER — KETOROLAC TROMETHAMINE 60 MG/2ML IM SOLN
60.0000 mg | Freq: Once | INTRAMUSCULAR | Status: AC
  Administered 2015-02-01: 60 mg via INTRAMUSCULAR

## 2015-02-01 MED ORDER — PREDNISONE 20 MG PO TABS: ORAL_TABLET | ORAL | Status: DC

## 2015-02-01 NOTE — Patient Instructions (Signed)
toradol shot today. Take 2 prednisone daily in AM for next 5 days. Tylenol as needed for pain. Return to see Dr. Carlean Jews if symptoms not getting better in 2 weeks.

## 2015-02-01 NOTE — Progress Notes (Signed)
[facility] [address], [place] [address] [phone]  Date:  02/01/2015   Name:  Roy Moore   DOB:  07/23/1955   MRN:  [id number]  PCP:  [name], MD    Chief Complaint: Sciatica and Shoulder Pain   History of Present Illness:  This is a 60 y.o. male with PMH CVA, anxiety, tobacco abuse, GERD, HLD who is presenting with left shoulder pain, stating he needs a cortisone shot. Pain started this morning when he woke up. Located to trapezius and anterior shoulder. States starts in trapezius and radiates down shoulder. Denies arm paresthesias or weakness. Pt was seen here 10/2014 and saw Dr. Carlean Jews, his regular PCP. He had a right biceps tendon injection at that time that improved his pain and no longer with right shoulder pain. He was seen in the ED 12/2014 for left leg sciatica. He was treated with toradol injection and norco. That pain has improved. He is wanting an injection in his left shoulder.  Review of Systems:  Review of Systems See HPI  Patient Active Problem List   Diagnosis Date Noted  . CVA, old, hemiparesis ([place]) 03/24/2011  . ANXIETY DISORDER 10/04/2007  . TOBACCO ABUSE 09/30/2007  . GERD 09/30/2007  . DYSLIPIDEMIA 09/15/2007  . STROKE WITH LEFT HEMIPARESIS 09/15/2007    Prior to Admission medications   Medication Sig Start Date End Date Taking? Authorizing Provider  ALPRAZolam [name]) 1 MG tablet Take 1 tablet (1 mg total) by mouth 3 (three) times daily as needed for anxiety. 11/05/14  Yes [name], MD  amitriptyline (ELAVIL) 50 MG tablet Take 1 tablet (50 mg total) by mouth at bedtime. 11/05/14  Yes [name], MD  aspirin 81 MG chewable tablet Chew 81 mg by mouth daily.    Yes Historical Provider, MD  B Complex Vitamins (VITAMIN B COMPLEX PO) Take 1 tablet by mouth daily.    Yes Historical Provider, MD  Cholecalciferol (VITAMIN D PO) Take 1 tablet by mouth daily.    Yes Historical Provider, MD  lovastatin (MEVACOR) 20  MG tablet Take 1 tablet (20 mg total) by mouth at bedtime. 11/05/14  Yes [name], MD  Thiamine HCl (VITAMIN B-1 PO) Take 1 tablet by mouth daily.    Yes Historical Provider, MD  HYDROcodone-acetaminophen (NORCO/VICODIN) 5-325 MG tablet Take 1 tablet by mouth 2 (two) times daily as needed for severe pain. Patient not taking: Reported on 01/03/2015 01/02/15   Everlene Balls, MD  oxyCODONE-acetaminophen (PERCOCET) 10-325 MG tablet Take 1 tablet by mouth every 8 (eight) hours as needed for pain. Patient not taking: Reported on 02/01/2015 01/03/15   [name], MD  predniSONE (DELTASONE) 20 MG tablet Two daily with food Patient not taking: Reported on 02/01/2015 01/03/15   [name], MD    No Known Allergies  Past Surgical History  Procedure Laterality Date  . Back surgery      10 yrs ago or so-fusion in neck   . Colonoscopy      15-20 yrs ago-normal per pt.   . Teeth removal      Social History  Substance Use Topics  . Smoking status: Current Every Day Smoker -- 1.00 packs/day    Types: Cigarettes  . Smokeless tobacco: Never Used  . Alcohol Use: No    Family History  Problem Relation Age of Onset  . Colon cancer Neg Hx   . Rectal cancer Neg Hx   . Stomach cancer Neg Hx   .  Esophageal cancer Neg Hx     Medication list has been reviewed and updated.  Physical Examination:  Physical Exam  Constitutional: He is oriented to person, place, and time. He appears well-developed and well-nourished. No distress.  HENT:  Head: Normocephalic and atraumatic.  Right Ear: Hearing normal.  Left Ear: Hearing normal.  Nose: Nose normal.  Eyes: Conjunctivae and lids are normal. Right eye exhibits no discharge. Left eye exhibits no discharge. No scleral icterus.  Cardiovascular: Normal rate, regular rhythm, normal heart sounds and normal pulses.   No murmur heard. Pulmonary/Chest: Effort normal and breath sounds normal. No respiratory distress. He has no wheezes. He has no rhonchi.  He has no rales.  Musculoskeletal:       Left shoulder: He exhibits decreased range of motion (shoulder flexion limited to 90 degrees. Unwilling to perform internal/external rotation due to pain) and tenderness (trapezius and anterior shoulder). He exhibits no swelling.  Pt with 5/5 resisted flexion, although quit exercise quickly d/t pain Tenderness along border of left scapula No cervical tenderness  Neurological: He is alert and oriented to person, place, and time. He has normal strength. No sensory deficit.  Skin: Skin is warm, dry and intact. No lesion and no rash noted.  Psychiatric: He has a normal mood and affect. His speech is normal and behavior is normal. Thought content normal.    BP 140/70 mmHg  Pulse 90  Temp(Src) 97.5 F (36.4 C) (Oral)  Resp 18  Ht 5\' 11"  (1.803 m)  Wt 182 lb (82.555 kg)  BMI 25.40 kg/m2  SpO2 98%  Assessment and Plan:  1. Shoulder pain, left 2. Neck pain Suspect neck/trapezius strain and nerve compression with radiation into shoulder/upper back. Gave toradol injection and refilled prednisone 40 mg QD x 5 days which has helped in the past for similar pains. Pt really felt he needed an injection into his shoulder joint. He requested to speak with a Dr. -- Dr. Laney Pastor spoke with pt and explained toradol and prednisone should work well and if not he should return for further evaluation. - ketorolac (TORADOL) injection 60 mg; Inject 2 mLs (60 mg total) into the muscle once. - predniSONE (DELTASONE) 20 MG tablet; Two daily with food  Dispense: 10 tablet; Refill: 0   [name], MHS [facility]  02/01/2015  I have participated in the care of this patient with the Advanced Practice Provider and agree with Diagnosis and Plan as documented. Unclear if cspine and shoulder are both involved although nothing emergent here so will treat more broadly and have fu 5 d to xray if not better [name], M.D.

## 2015-02-11 ENCOUNTER — Ambulatory Visit (INDEPENDENT_AMBULATORY_CARE_PROVIDER_SITE_OTHER): Payer: Commercial Managed Care - HMO | Admitting: Family Medicine

## 2015-02-11 ENCOUNTER — Telehealth: Payer: Self-pay

## 2015-02-11 VITALS — BP 120/68 | HR 87 | Temp 98.9°F | Resp 20 | Ht 70.47 in | Wt 178.0 lb

## 2015-02-11 DIAGNOSIS — M542 Cervicalgia: Secondary | ICD-10-CM

## 2015-02-11 DIAGNOSIS — M25512 Pain in left shoulder: Secondary | ICD-10-CM

## 2015-02-11 DIAGNOSIS — M19012 Primary osteoarthritis, left shoulder: Secondary | ICD-10-CM

## 2015-02-11 DIAGNOSIS — J320 Chronic maxillary sinusitis: Secondary | ICD-10-CM

## 2015-02-11 MED ORDER — PREDNISONE 20 MG PO TABS: ORAL_TABLET | ORAL | Status: DC

## 2015-02-11 MED ORDER — AMOXICILLIN-POT CLAVULANATE 875-125 MG PO TABS
1.0000 | ORAL_TABLET | Freq: Two times a day (BID) | ORAL | Status: DC
Start: 2015-02-11 — End: 2015-04-03

## 2015-02-11 NOTE — Progress Notes (Signed)
By signing my name below, I, [name], attest that this documentation has been prepared under the direction and in the presence of [name], MD. Electronically Signed: [name], [place]. 02/11/2015 , 3:46 PM .  Patient was seen in room 8 .   Patient ID: Roy Moore MRN: [id number], DOB: 25-Jan-1956, 60 y.o. Date of Encounter: 02/11/2015  Primary Physician: [name], MD  Chief Complaint:  Chief Complaint  Patient presents with  . Follow-up    shoulder pain, left    HPI:  Roy Moore is a 60 y.o. male who presents to [facility] follow up for left shoulder pain. When he raises his left arm up, he feels pain in his left shoulder. He was previously treated with a toradol injection and norco for improvement. He was seen by Bennett Scrape 10 days ago complaining of left shoulder pain.   Past Medical History  Diagnosis Date  . Stroke ([place])   . Kidney stone   . Hyperlipidemia      Home Meds: Prior to Admission medications   Medication Sig Start Date End Date Taking? Authorizing Provider  ALPRAZolam [name]) 1 MG tablet Take 1 tablet (1 mg total) by mouth 3 (three) times daily as needed for anxiety. 11/05/14   [name], MD  amitriptyline (ELAVIL) 50 MG tablet Take 1 tablet (50 mg total) by mouth at bedtime. 11/05/14   [name], MD  aspirin 81 MG chewable tablet Chew 81 mg by mouth daily.     Historical Provider, MD  B Complex Vitamins (VITAMIN B COMPLEX PO) Take 1 tablet by mouth daily.     Historical Provider, MD  Cholecalciferol (VITAMIN D PO) Take 1 tablet by mouth daily.     Historical Provider, MD  HYDROcodone-acetaminophen (NORCO/VICODIN) 5-325 MG tablet Take 1 tablet by mouth 2 (two) times daily as needed for severe pain. Patient not taking: Reported on 01/03/2015 01/02/15   Everlene Balls, MD  lovastatin (MEVACOR) 20 MG tablet Take 1 tablet (20 mg total) by mouth at bedtime. 11/05/14   [name], MD    oxyCODONE-acetaminophen (PERCOCET) 10-325 MG tablet Take 1 tablet by mouth every 8 (eight) hours as needed for pain. Patient not taking: Reported on 02/01/2015 01/03/15   [name], MD  predniSONE (DELTASONE) 20 MG tablet Two daily with food 02/01/15   Ezekiel Slocumb, PA-C  Thiamine HCl (VITAMIN B-1 PO) Take 1 tablet by mouth daily.     Historical Provider, MD    Allergies: No Known Allergies  Social History   Social History  . Marital Status: Married    Spouse Name: [name]  . Number of Children: [name]  . Years of Education: [name]   Occupational History  . Not on file.   Social History Main Topics  . Smoking status: Current Every Day Smoker -- 1.00 packs/day    Types: Cigarettes  . Smokeless tobacco: Never Used  . Alcohol Use: No  . Drug Use: No  . Sexual Activity: Not on file   Other Topics Concern  . Not on file   Social History Narrative     Review of Systems: Constitutional: negative for fever, chills, night sweats, weight changes, or fatigue  HEENT: negative for vision changes, hearing loss, congestion, rhinorrhea, ST, epistaxis, or sinus pressure Cardiovascular: negative for chest pain or palpitations Respiratory: negative for hemoptysis, wheezing, shortness of breath, or cough Abdominal: negative for abdominal pain, nausea, vomiting, diarrhea, or constipation Dermatological: negative for rash Neurologic: negative for  headache, dizziness, or syncope Musc: positive for shoulder pain (left) All other systems reviewed and are otherwise negative with the exception to those above and in the HPI.  Physical Exam: Blood pressure 120/68, pulse 87, temperature 98.9 F (37.2 C), temperature source Oral, resp. rate 20, height 5' 10.47" (1.79 m), weight 178 lb (80.74 kg), SpO2 98 %., Body mass index is 25.2 kg/(m^2). General: Well developed, well nourished, in no acute distress. Head: Normocephalic, atraumatic, eyes without discharge, sclera non-icteric, nares are without  discharge. Bilateral auditory canals clear, TM's are without perforation, pearly grey and translucent with reflective cone of light bilaterally. Oral cavity moist, posterior pharynx without exudate, erythema, peritonsillar abscess, or post nasal drip.  Neck: Supple. No thyromegaly. Full ROM. No lymphadenopathy. Lungs: Clear bilaterally to auscultation without wheezes, rales, or rhonchi. Breathing is unlabored. Heart: RRR with S1 S2. No murmurs, rubs, or gallops appreciated. Msk:  Strength and tone normal for age. Extremities/Skin: Warm and dry. No clubbing or cyanosis. No edema. No rashes or suspicious lesions. Neuro: Alert and oriented X 3. Moves all extremities spontaneously. Gait is normal. CNII-XII grossly in tact. Psych:  Responds to questions appropriately with a normal affect.   Left shoulder injected under the acromion with 1 cc marcaine and 1 cc Kenalog (40 mg) without complication after informed consent  ASSESSMENT AND PLAN:  60 y.o. year old male with  This chart was scribed in my presence and reviewed by me personally.    ICD-9-CM ICD-10-CM   1. Primary osteoarthritis of left shoulder 715.11 M19.012   2. Left shoulder pain 719.41 M25.512   3. Chronic maxillary sinusitis 473.0 J32.0 amoxicillin-clavulanate (AUGMENTIN) 875-125 MG tablet  4. Shoulder pain, left 719.41 M25.512 predniSONE (DELTASONE) 20 MG tablet  5. Neck pain 723.1 M54.2 predniSONE (DELTASONE) 20 MG tablet     Signed, [name], MD     Signed, [name], MD 02/11/2015 3:53 PM

## 2015-02-11 NOTE — Telephone Encounter (Signed)
Pt shoulder is still not better and is wanting to see dr Joseph Art

## 2015-02-17 ENCOUNTER — Ambulatory Visit (INDEPENDENT_AMBULATORY_CARE_PROVIDER_SITE_OTHER): Payer: Commercial Managed Care - HMO | Admitting: Family Medicine

## 2015-02-17 VITALS — BP 156/76 | HR 97 | Temp 98.3°F | Resp 16 | Ht 70.0 in | Wt 181.0 lb

## 2015-02-17 DIAGNOSIS — M7551 Bursitis of right shoulder: Secondary | ICD-10-CM | POA: Diagnosis not present

## 2015-02-17 DIAGNOSIS — M25512 Pain in left shoulder: Secondary | ICD-10-CM

## 2015-02-17 NOTE — Progress Notes (Signed)
By signing my name below, I, Roy Moore, attest that this documentation has been prepared under the direction and in the presence of Roy [name], MD. Electronically Signed: [name], [place]. 02/17/2015 , 4:19 PM .  Patient was seen in room 2 .   Patient ID: Roy Moore MRN: [id number], DOB: 1955-12-14, 60 y.o. Date of Encounter: 02/17/2015  Primary Physician: Roy [name], MD  Chief Complaint:  Chief Complaint  Patient presents with  . Shoulder Pain    right , x this morning    HPI:  Roy Moore is a 60 y.o. male who presents to [facility] complaining of right shoulder pain was noticed this morning. Patient was last seen here 6 days ago with left shoulder pain. He was treated with a toradol injection and norco for improvement.   He's really sore over right shoulder. He denies cough or chest pain.   Past Medical History  Diagnosis Date  . Stroke ([place])   . Kidney stone   . Hyperlipidemia      Home Meds: Prior to Admission medications   Medication Sig Start Date End Date Taking? Authorizing Provider  ALPRAZolam Roy Moore) 1 MG tablet Take 1 tablet (1 mg total) by mouth 3 (three) times daily as needed for anxiety. 11/05/14  Yes Roy [name], MD  amitriptyline (ELAVIL) 50 MG tablet Take 1 tablet (50 mg total) by mouth at bedtime. 11/05/14  Yes Roy [name], MD  amoxicillin-clavulanate (AUGMENTIN) 875-125 MG tablet Take 1 tablet by mouth 2 (two) times daily. 02/11/15  Yes Roy [name], MD  aspirin 81 MG chewable tablet Chew 81 mg by mouth daily.    Yes Historical Provider, MD  B Complex Vitamins (VITAMIN B COMPLEX PO) Take 1 tablet by mouth daily.    Yes Historical Provider, MD  Cholecalciferol (VITAMIN D PO) Take 1 tablet by mouth daily.    Yes Historical Provider, MD  HYDROcodone-acetaminophen (NORCO/VICODIN) 5-325 MG tablet Take 1 tablet by mouth 2 (two) times daily as needed for severe pain. 01/02/15  Yes Everlene Balls, MD    lovastatin (MEVACOR) 20 MG tablet Take 1 tablet (20 mg total) by mouth at bedtime. 11/05/14  Yes Roy [name], MD  oxyCODONE-acetaminophen (PERCOCET) 10-325 MG tablet Take 1 tablet by mouth every 8 (eight) hours as needed for pain. 01/03/15  Yes Roy [name], MD  predniSONE (DELTASONE) 20 MG tablet Two daily with food 02/11/15  Yes Roy [name], MD  Thiamine HCl (VITAMIN B-1 PO) Take 1 tablet by mouth daily.    Yes Historical Provider, MD    Allergies: No Known Allergies  Social History   Social History  . Marital Status: Married    Spouse Name: [name]  . Number of Children: [name]  . Years of Education: [name]   Occupational History  . Not on file.   Social History Main Topics  . Smoking status: Current Every Day Smoker -- 1.00 packs/day    Types: Cigarettes  . Smokeless tobacco: Never Used  . Alcohol Use: No  . Drug Use: No  . Sexual Activity: Not on file   Other Topics Concern  . Not on file   Social History Narrative     Review of Systems: Constitutional: negative for fever, chills, night sweats, weight changes, or fatigue  HEENT: negative for vision changes, hearing loss, congestion, rhinorrhea, ST, epistaxis, or sinus pressure Cardiovascular: negative for chest pain or palpitations Respiratory: negative for hemoptysis, wheezing, shortness of breath, or cough Abdominal: negative for abdominal pain, nausea,  vomiting, diarrhea, or constipation Dermatological: negative for rash Neurologic: negative for headache, dizziness, or syncope Musc: positive for shoulder pain (right) All other systems reviewed and are otherwise negative with the exception to those above and in the HPI.  Physical Exam: Moore pressure 156/76, pulse 97, temperature 98.3 F (36.8 C), temperature source Oral, resp. rate 16, height 5\' 10"  (1.778 m), weight 181 lb (82.101 kg), SpO2 99 %., Body mass index is 25.97 kg/(m^2). General: Well developed, well nourished, in no acute distress. Head:  Normocephalic, atraumatic, eyes without discharge, sclera non-icteric, nares are without discharge. Bilateral auditory canals clear, TM's are without perforation, pearly grey and translucent with reflective cone of light bilaterally. Oral cavity moist, posterior pharynx without exudate, erythema, peritonsillar abscess, or post nasal drip.  Neck: Supple. No thyromegaly. Full ROM. No lymphadenopathy. Lungs: Clear bilaterally to auscultation without wheezes, rales, or rhonchi. Breathing is unlabored. Heart: RRR with S1 S2. No murmurs, rubs, or gallops appreciated. Abdomen: Soft, non-tender, non-distended with normoactive bowel sounds. No hepatomegaly. No rebound/guarding. No obvious abdominal masses. Msk:  Strength and tone normal for age. Extremities/Skin: Warm and dry.  Neuro: Alert and oriented X 3. Moves all extremities spontaneously. Gait is normal. CNII-XII grossly in tact. Psych:  Responds to questions appropriately with a normal affect.   Area of pain identified just in the subacromial space on the right shoulder. After informed consent, area was prepped with isopropyl alcohol and then injected 1 mL of Kenalog 40 mg and 1 mL of Marcaine 0.5%. Patient experienced moderate relief.  ASSESSMENT AND PLAN:  60 y.o. year old male with recurrent right-sided acromial bursitis. He's to call me if he doesn't see significant improvement in the next 48 hours.  This chart was scribed in my presence and reviewed by me personally.     Signed, Roy [name], MD 02/17/2015 4:19 PM

## 2015-03-04 ENCOUNTER — Telehealth: Payer: Self-pay

## 2015-03-04 ENCOUNTER — Other Ambulatory Visit: Payer: Self-pay | Admitting: Family Medicine

## 2015-03-04 DIAGNOSIS — M25512 Pain in left shoulder: Secondary | ICD-10-CM

## 2015-03-04 DIAGNOSIS — M542 Cervicalgia: Secondary | ICD-10-CM

## 2015-03-04 NOTE — Telephone Encounter (Signed)
Pt is needing are refill on prednizone for his shoulder pain   Best number [phone]

## 2015-03-05 MED ORDER — PREDNISONE 20 MG PO TABS: ORAL_TABLET | ORAL | Status: DC

## 2015-03-08 ENCOUNTER — Ambulatory Visit (INDEPENDENT_AMBULATORY_CARE_PROVIDER_SITE_OTHER): Payer: Commercial Managed Care - HMO

## 2015-03-08 ENCOUNTER — Ambulatory Visit (INDEPENDENT_AMBULATORY_CARE_PROVIDER_SITE_OTHER): Payer: Commercial Managed Care - HMO | Admitting: Family Medicine

## 2015-03-08 DIAGNOSIS — M25511 Pain in right shoulder: Secondary | ICD-10-CM

## 2015-03-08 MED ORDER — DICLOFENAC SODIUM 75 MG PO TBEC: 75.0000 mg | DELAYED_RELEASE_TABLET | Freq: Two times a day (BID) | ORAL | Status: DC

## 2015-03-08 NOTE — Progress Notes (Signed)
@[facility] @  By signing my name below, I, Raven Small, attest that this documentation has been prepared under the direction and in the presence of [name], MD.  Electronically Signed: Thea Alken, ED Scribe. 03/08/2015. 11:45 AM.   Patient ID: Roy Moore MRN: [id number], DOB: 1956-01-24, 60 y.o. Date of Encounter: 03/08/2015, 11:39 AM  Primary Physician: [name], MD  Chief Complaint:  Chief Complaint  Patient presents with  . Sciatica  . Shoulder Pain    left    HPI: 60 y.o. year old male with history below presents with worsening right shoulder pain. Pt states while laying in bed last night, he turned over and felt sharp shoulder pain. He has had soreness with ROM of right shoulder. He is otherwise doing well. He needing disability paperwork filled out but did not bring it with him.  Past Medical History  Diagnosis Date  . Stroke ([place])   . Kidney stone   . Hyperlipidemia      Home Meds: Prior to Admission medications   Medication Sig Start Date End Date Taking? Authorizing Provider  ALPRAZolam [name]) 1 MG tablet Take 1 tablet (1 mg total) by mouth 3 (three) times daily as needed for anxiety. 11/05/14  Yes [name], MD  amitriptyline (ELAVIL) 50 MG tablet Take 1 tablet (50 mg total) by mouth at bedtime. 11/05/14  Yes [name], MD  aspirin 81 MG chewable tablet Chew 81 mg by mouth daily.    Yes Historical Provider, MD  B Complex Vitamins (VITAMIN B COMPLEX PO) Take 1 tablet by mouth daily.    Yes Historical Provider, MD  Cholecalciferol (VITAMIN D PO) Take 1 tablet by mouth daily.    Yes Historical Provider, MD  lovastatin (MEVACOR) 20 MG tablet Take 1 tablet (20 mg total) by mouth at bedtime. 11/05/14  Yes [name], MD  oxyCODONE-acetaminophen (PERCOCET) 10-325 MG tablet Take 1 tablet by mouth every 8 (eight) hours as needed for pain. 01/03/15  Yes [name], MD  Thiamine HCl (VITAMIN B-1 PO) Take 1 tablet by mouth daily.    Yes  Historical Provider, MD  amoxicillin-clavulanate (AUGMENTIN) 875-125 MG tablet Take 1 tablet by mouth 2 (two) times daily. Patient not taking: Reported on 03/08/2015 02/11/15   [name], MD  HYDROcodone-acetaminophen (NORCO/VICODIN) 5-325 MG tablet Take 1 tablet by mouth 2 (two) times daily as needed for severe pain. Patient not taking: Reported on 03/08/2015 01/02/15   Everlene Balls, MD  predniSONE (DELTASONE) 20 MG tablet Two daily with food Patient not taking: Reported on 03/08/2015 03/05/15   [name], MD    Allergies: No Known Allergies  Social History   Social History  . Marital Status: Married    Spouse Name: [name]  . Number of Children: [name]  . Years of Education: [name]   Occupational History  . Not on file.   Social History Main Topics  . Smoking status: Current Every Day Smoker -- 1.00 packs/day    Types: Cigarettes  . Smokeless tobacco: Never Used  . Alcohol Use: No  . Drug Use: No  . Sexual Activity: Not on file   Other Topics Concern  . Not on file   Social History Narrative     Review of Systems: Constitutional: negative for chills, fever, night sweats, weight changes, or fatigue  HEENT: negative for vision changes, hearing loss, congestion, rhinorrhea, ST, epistaxis, or sinus pressure Cardiovascular: negative for chest pain or palpitations Respiratory: negative for hemoptysis, wheezing, shortness of breath, or cough Abdominal: negative  for abdominal pain, nausea, vomiting, diarrhea, or constipation Dermatological: negative for rash Neurologic: negative for headache, dizziness, or syncope All other systems reviewed and are otherwise negative with the exception to those above and in the HPI.   Physical Exam: There were no vitals taken for this visit., There is no weight on file to calculate BMI. General: Well developed, well nourished, in no acute distress. Head: Normocephalic, atraumatic, eyes without discharge, sclera non-icteric, nares are without  discharge. Bilateral auditory canals clear, TM's are without perforation, pearly grey and translucent with reflective cone of light bilaterally. Oral cavity moist.  Neck: Supple. No thyromegaly. Full ROM. No lymphadenopathy. Lungs: Clear bilaterally to auscultation without wheezes, rales, or rhonchi. Breathing is unlabored. Heart: RRR with S1 S2. No murmurs, rubs, or gallops appreciated. Msk:  Strength and tone normal for age. He has pain with abduction over shoulder height.  Extremities/Skin: Warm and dry. No clubbing or cyanosis. No edema. No rashes or suspicious lesions. Neuro: Alert and oriented X 3. Moves all extremities spontaneously. Gait is normal. CNII-XII grossly in tact. Psych:  Responds to questions appropriately with a normal affect.   [facility] reading (PRIMARY) by Dr. Joseph Art. Right shoulder: irregular surface on the acromion  but the glenoid and humerus appear to be normal  Right shoulder was injected with 2 cc marcaine 0.25%  ASSESSMENT AND PLAN:  60 y.o. year old male with     ICD-9-CM ICD-10-CM   1. Right shoulder pain 719.41 M25.511 DG Shoulder Right     diclofenac (VOLTAREN) 75 MG EC tablet   disability letter was written. This chart was scribed in my presence and reviewed by me personally.   Signed, [name], MD 03/08/2015 11:39 AM

## 2015-03-28 ENCOUNTER — Other Ambulatory Visit: Payer: Self-pay | Admitting: Family Medicine

## 2015-03-30 ENCOUNTER — Other Ambulatory Visit: Payer: Self-pay | Admitting: Family Medicine

## 2015-04-03 ENCOUNTER — Ambulatory Visit (INDEPENDENT_AMBULATORY_CARE_PROVIDER_SITE_OTHER): Payer: Commercial Managed Care - HMO | Admitting: Family Medicine

## 2015-04-03 VITALS — BP 116/60 | HR 86 | Temp 98.3°F | Resp 16 | Ht 70.0 in | Wt 181.0 lb

## 2015-04-03 DIAGNOSIS — M25511 Pain in right shoulder: Secondary | ICD-10-CM | POA: Diagnosis not present

## 2015-04-03 DIAGNOSIS — M25512 Pain in left shoulder: Secondary | ICD-10-CM | POA: Diagnosis not present

## 2015-04-03 DIAGNOSIS — M5432 Sciatica, left side: Secondary | ICD-10-CM | POA: Diagnosis not present

## 2015-04-03 DIAGNOSIS — M5431 Sciatica, right side: Secondary | ICD-10-CM

## 2015-04-03 MED ORDER — DICLOFENAC SODIUM 75 MG PO TBEC: 75.0000 mg | DELAYED_RELEASE_TABLET | Freq: Two times a day (BID) | ORAL | Status: DC

## 2015-04-03 MED ORDER — GABAPENTIN 100 MG PO CAPS: 100.0000 mg | ORAL_CAPSULE | Freq: Every day | ORAL | Status: DC

## 2015-04-03 NOTE — Progress Notes (Signed)
By signing my name below, I, Roy Moore, attest that this documentation has been prepared under the direction and in the presence of [name], MD. Electronically Signed: [name], [place]. 04/03/2015 , 2:48 PM .  Patient was seen in room 1 .   Patient ID: Roy Moore MRN: [id number], DOB: June 29, 1955, 60 y.o. Date of Encounter: 04/03/2015  Primary Physician: [name], MD  Chief Complaint:  Chief Complaint  Patient presents with  . other    sciatica/ pain on both sides of body    HPI:  Roy Moore is a 60 y.o. male who presents to [facility] complaining of sciatica with pain going down both sides of his lower extremities, ongoing for about 2 months. He reports that he would have large amount of pain in his arms and legs to the point where he has difficulty sleeping. He would take pain medication to help him sleep through the night.   Past Medical History  Diagnosis Date  . Stroke ([place])   . Kidney stone   . Hyperlipidemia      Home Meds: Prior to Admission medications   Medication Sig Start Date End Date Taking? Authorizing Provider  ALPRAZolam [name]) 1 MG tablet Take 1 tablet (1 mg total) by mouth 3 (three) times daily as needed for anxiety. 11/05/14   [name], MD  amitriptyline (ELAVIL) 50 MG tablet Take 1 tablet (50 mg total) by mouth at bedtime. 11/05/14   [name], MD  amoxicillin-clavulanate (AUGMENTIN) 875-125 MG tablet Take 1 tablet by mouth 2 (two) times daily. Patient not taking: Reported on 03/08/2015 02/11/15   [name], MD  aspirin 81 MG chewable tablet Chew 81 mg by mouth daily.     Historical Provider, MD  B Complex Vitamins (VITAMIN B COMPLEX PO) Take 1 tablet by mouth daily.     Historical Provider, MD  Cholecalciferol (VITAMIN D PO) Take 1 tablet by mouth daily.     Historical Provider, MD  diclofenac (VOLTAREN) 75 MG EC tablet TAKE ONE TABLET BY MOUTH TWICE DAILY 03/29/15   [name], MD    HYDROcodone-acetaminophen (NORCO/VICODIN) 5-325 MG tablet Take 1 tablet by mouth 2 (two) times daily as needed for severe pain. Patient not taking: Reported on 03/08/2015 01/02/15   Everlene Balls, MD  lovastatin (MEVACOR) 20 MG tablet Take 1 tablet (20 mg total) by mouth at bedtime. 11/05/14   [name], MD  oxyCODONE-acetaminophen (PERCOCET) 10-325 MG tablet Take 1 tablet by mouth every 8 (eight) hours as needed for pain. 01/03/15   [name], MD  predniSONE (DELTASONE) 20 MG tablet Two daily with food Patient not taking: Reported on 03/08/2015 03/05/15   [name], MD  Thiamine HCl (VITAMIN B-1 PO) Take 1 tablet by mouth daily.     Historical Provider, MD    Allergies: No Known Allergies  Social History   Social History  . Marital Status: Married    Spouse Name: [name]  . Number of Children: [name]  . Years of Education: [name]   Occupational History  . Not on file.   Social History Main Topics  . Smoking status: Current Every Day Smoker -- 1.00 packs/day    Types: Cigarettes  . Smokeless tobacco: Never Used  . Alcohol Use: No  . Drug Use: No  . Sexual Activity: Not on file   Other Topics Concern  . Not on file   Social History Narrative     Review of Systems: Constitutional: negative for  fever, chills, night sweats, weight changes, or fatigue  HEENT: negative for vision changes, hearing loss, congestion, rhinorrhea, ST, epistaxis, or sinus pressure Cardiovascular: negative for chest pain or palpitations Respiratory: negative for hemoptysis, wheezing, shortness of breath, or cough Abdominal: negative for abdominal pain, nausea, vomiting, diarrhea, or constipation Dermatological: negative for rash Musc: positive for myalgia Neurologic: negative for headache, dizziness, or syncope All other systems reviewed and are otherwise negative with the exception to those above and in the HPI.  Physical Exam: Moore pressure 116/60, pulse 86, temperature 98.3 F (36.8 C),  temperature source Oral, resp. rate 16, height 5\' 10"  (1.778 m), weight 181 lb (82.101 kg), SpO2 98 %., Body mass index is 25.97 kg/(m^2). General: Well developed, well nourished, in no acute distress. Head: Normocephalic, atraumatic, eyes without discharge, sclera non-icteric, nares are without discharge. Bilateral auditory canals clear, TM's are without perforation, pearly grey and translucent with reflective cone of light bilaterally. Oral cavity moist, posterior pharynx without exudate, erythema, peritonsillar abscess, or post nasal drip.  Neck: Supple. No thyromegaly. Full ROM. No lymphadenopathy. Lungs: Clear bilaterally to auscultation without wheezes, rales, or rhonchi. Breathing is unlabored. Heart: RRR with S1 S2. No murmurs, rubs, or gallops appreciated. Abdomen: Soft, non-tender, non-distended with normoactive bowel sounds. No hepatomegaly. No rebound/guarding. No obvious abdominal masses. Msk:  Strength and tone normal for age. Extremities/Skin: Warm and dry. No clubbing or cyanosis. No edema. No rashes or suspicious lesions; Normal straight leg raising, normal shoulder rom, good muscle tone and bulk Neuro: Alert and oriented X 3. Moves all extremities spontaneously. Gait is normal. CNII-XII grossly in tact. Psych:  Responds to questions appropriately with a normal affect.   Labs:  ASSESSMENT AND PLAN:  60 y.o. year old male with  This chart was scribed in my presence and reviewed by me personally.    ICD-9-CM ICD-10-CM   1. Bilateral sciatica 724.3 M54.31 gabapentin (NEURONTIN) 100 MG capsule    M54.32 diclofenac (VOLTAREN) 75 MG EC tablet  2. Pain of both shoulder joints 719.41 M25.511 gabapentin (NEURONTIN) 100 MG capsule    M25.512 diclofenac (VOLTAREN) 75 MG EC tablet  3. Sciatica of left side 724.3 M54.32 diclofenac (VOLTAREN) 75 MG EC tablet     Signed, [name], MD     Signed, [name], MD 04/03/2015 2:48 PM

## 2015-04-03 NOTE — Patient Instructions (Signed)
Call me if you are not responding well to the new medicine: gabapentin

## 2015-04-10 ENCOUNTER — Ambulatory Visit (INDEPENDENT_AMBULATORY_CARE_PROVIDER_SITE_OTHER): Payer: Commercial Managed Care - HMO | Admitting: Family Medicine

## 2015-04-10 VITALS — BP 175/76 | HR 87 | Temp 97.9°F | Resp 16 | Ht 70.0 in | Wt 182.0 lb

## 2015-04-10 DIAGNOSIS — M1990 Unspecified osteoarthritis, unspecified site: Secondary | ICD-10-CM

## 2015-04-10 MED ORDER — PREDNISONE 20 MG PO TABS: ORAL_TABLET | ORAL | Status: DC

## 2015-04-10 NOTE — Patient Instructions (Signed)
I want to give the prednisone chance to work before any further injections. By me know if you're not better by Tuesday next.

## 2015-04-10 NOTE — Progress Notes (Signed)
Patient ID: Roy Moore, male   DOB: 1955-09-29, 60 y.o.   MRN: [id number]  By signing my name below, I, Roy Moore, attest that this documentation has been prepared under the direction and in the presence of [name], MD Electronically Signed: [name], ED Scribe 04/10/2015 at 11:44 AM.  Patient ID: Roy Moore MRN: [id number], DOB: [birth year], 60 y.o. Date of Encounter: 04/10/2015, 11:33 AM  Primary Physician: [name], MD  Chief Complaint:  Chief Complaint  Patient presents with   Follow-up    Bilateral sciatica   HPI: 60 y.o. year old male with history below presents for a follow-up regarding sciatica. Pt was seen in the office on 04/03/15 for bilateral sciatica; prescribed Voltaren gel and Neurontin without significant relief. Today, he reports that he is still experiencing intermittent bilateral hand pain, right worse than left, that is exacerbated with forming a fist. Pt reports associated right hand swelling and bilateral shoulder pain, left worse than right, that is exacerbated with lifting. He denies rash.   Smoking Pt is a smoker; states that he is down to 1 ppd.   Past Medical History  Diagnosis Date   Stroke [facility])    Kidney stone    Hyperlipidemia     Home Meds: Prior to Admission medications   Medication Sig Start Date End Date Taking? Authorizing Provider  ALPRAZolam [name]) 1 MG tablet Take 1 tablet (1 mg total) by mouth 3 (three) times daily as needed for anxiety. 11/05/14  Yes [name], MD  amitriptyline (ELAVIL) 50 MG tablet Take 1 tablet (50 mg total) by mouth at bedtime. 11/05/14  Yes [name], MD  aspirin 81 MG chewable tablet Chew 81 mg by mouth daily.    Yes Historical Provider, MD  B Complex Vitamins (VITAMIN B COMPLEX PO) Take 1 tablet by mouth daily.    Yes Historical Provider, MD  Cholecalciferol (VITAMIN D PO) Take 1 tablet by mouth daily.    Yes Historical Provider, MD  diclofenac (VOLTAREN) 75 MG EC tablet  Take 1 tablet (75 mg total) by mouth 2 (two) times daily. 04/03/15  Yes [name], MD  gabapentin (NEURONTIN) 100 MG capsule Take 1 capsule (100 mg total) by mouth at bedtime. 04/03/15  Yes [name], MD  lovastatin (MEVACOR) 20 MG tablet Take 1 tablet (20 mg total) by mouth at bedtime. 11/05/14  Yes [name], MD  Thiamine HCl (VITAMIN B-1 PO) Take 1 tablet by mouth daily.    Yes Historical Provider, MD   Allergies: No Known Allergies  Social History   Social History   Marital Status: Married    Spouse Name: [name]   Number of Children: [name]   Years of Education: [name]   Occupational History   Not on file.   Social History Main Topics   Smoking status: Current Every Day Smoker -- 1.00 packs/day    Types: Cigarettes   Smokeless tobacco: Never Used   Alcohol Use: No   Drug Use: No   Sexual Activity: Not on file   Other Topics Concern   Not on file   Social History Narrative    Review of Systems: Constitutional: negative for chills, fever, night sweats, weight changes, or fatigue  HEENT: negative for vision changes, hearing loss, congestion, rhinorrhea, ST, epistaxis, or sinus pressure Cardiovascular: negative for chest pain or palpitations Respiratory: negative for hemoptysis, wheezing, shortness of breath, or cough Abdominal: negative for abdominal pain, nausea, vomiting, diarrhea, or constipation Msk: +arthralgias, +joint swelling  Dermatological: negative  for rash Neurologic: negative for headache, dizziness, or syncope All other systems reviewed and are otherwise negative with the exception to those above and in the HPI.  Physical Exam: Blood pressure 175/76, pulse 87, temperature 97.9 F (36.6 C), temperature source Oral, resp. rate 16, height 5\' 10"  (1.778 m), weight 182 lb (82.555 kg), SpO2 95 %., Body mass index is 26.11 kg/(m^2). General: Well developed, well nourished, in no acute distress. Head: Normocephalic, atraumatic, eyes without  discharge, sclera non-icteric, nares are without discharge. Bilateral auditory canals clear, TM's are without perforation, pearly grey and translucent with reflective cone of light bilaterally. Oral cavity moist, posterior pharynx without exudate, erythema, peritonsillar abscess, or post nasal drip.  Neck: Supple. No thyromegaly. Full ROM. No lymphadenopathy. Lungs: Clear bilaterally to auscultation without wheezes, rales, or rhonchi. Breathing is unlabored. Heart: RRR with S1 S2. No murmurs, rubs, or gallops appreciated. Msk: Generalized R hand swelling. Stiffness in R hand. Decreased ROM in both shoulders. Weak grasp in R hand.  Extremities/Skin: Warm and dry. No clubbing or cyanosis. No edema. No rashes or suspicious lesions. Neuro: Alert and oriented X 3. Moves all extremities spontaneously. Gait is normal. CNII-XII grossly in tact. Psych:  Responds to questions appropriately with a normal affect.   Labs:  ASSESSMENT AND PLAN:  60 y.o. year old male with  1. Arthritis    This chart was scribed in my presence and reviewed by me personally.    ICD-9-CM ICD-10-CM   1. Arthritis 716.90 M19.90 predniSONE (DELTASONE) 20 MG tablet     Signed, [name], MD   Signed, [name], MD 04/10/2015 11:33 AM

## 2015-04-22 ENCOUNTER — Other Ambulatory Visit: Payer: Self-pay | Admitting: Family Medicine

## 2015-04-25 NOTE — Telephone Encounter (Signed)
Dr L, I have denied the RF of pred per protocol. Do you want to RF the diclonenac, or get pt in for injection (per OV notes?)?

## 2015-04-25 NOTE — Telephone Encounter (Signed)
Pt's spuse called req. Refill for...   predniSONE (DELTASONE) 20 MG tablet & diclofenac (VOLTAREN) 75 MG EC tablet    Pharmacy: [facility] [place], [place] - [address]   [phone] Please call when ordered.

## 2015-04-26 NOTE — Telephone Encounter (Signed)
Called pt and LM that RFs were sent. Gave him Dr Lenn Cal sch next week to come in for check up.

## 2015-05-07 ENCOUNTER — Other Ambulatory Visit: Payer: Self-pay | Admitting: Family Medicine

## 2015-05-08 ENCOUNTER — Ambulatory Visit (INDEPENDENT_AMBULATORY_CARE_PROVIDER_SITE_OTHER): Payer: Commercial Managed Care - HMO | Admitting: Family Medicine

## 2015-05-08 VITALS — BP 136/64 | HR 85 | Temp 98.0°F | Resp 16 | Ht 70.0 in | Wt 177.0 lb

## 2015-05-08 DIAGNOSIS — M5431 Sciatica, right side: Secondary | ICD-10-CM

## 2015-05-08 DIAGNOSIS — M25511 Pain in right shoulder: Secondary | ICD-10-CM | POA: Diagnosis not present

## 2015-05-08 DIAGNOSIS — G4700 Insomnia, unspecified: Secondary | ICD-10-CM

## 2015-05-08 DIAGNOSIS — H6523 Chronic serous otitis media, bilateral: Secondary | ICD-10-CM

## 2015-05-08 DIAGNOSIS — M25512 Pain in left shoulder: Secondary | ICD-10-CM | POA: Diagnosis not present

## 2015-05-08 DIAGNOSIS — M5432 Sciatica, left side: Secondary | ICD-10-CM

## 2015-05-08 DIAGNOSIS — Z0000 Encounter for general adult medical examination without abnormal findings: Secondary | ICD-10-CM

## 2015-05-08 DIAGNOSIS — E785 Hyperlipidemia, unspecified: Secondary | ICD-10-CM | POA: Diagnosis not present

## 2015-05-08 LAB — COMPLETE METABOLIC PANEL WITH GFR
ALT: 28 U/L (ref 9–46)
AST: 19 U/L (ref 10–35)
Albumin: 3.9 g/dL (ref 3.6–5.1)
Alkaline Phosphatase: 92 U/L (ref 40–115)
BUN: 14 mg/dL (ref 7–25)
CO2: 27 mmol/L (ref 20–31)
Calcium: 9.1 mg/dL (ref 8.6–10.3)
Chloride: 103 mmol/L (ref 98–110)
Creat: 0.96 mg/dL (ref 0.70–1.33)
GFR, Est African American: >89 mL/min (ref >=60)
GFR, Est Non African American: 87 mL/min (ref >=60)
Glucose, Bld: 84 mg/dL (ref 65–99)
Potassium: 4.3 mmol/L (ref 3.5–5.3)
Sodium: 140 mmol/L (ref 135–146)
Total Bilirubin: 0.6 mg/dL (ref 0.2–1.2)
Total Protein: 6.5 g/dL (ref 6.1–8.1)

## 2015-05-08 LAB — POCT CBC
Granulocyte percent: 73.1 %G (ref 37–80)
HCT, POC: 40.5 % — AB (ref 43.5–53.7)
Hemoglobin: 13.8 g/dL — AB (ref 14.1–18.1)
Lymph, poc: 2.3 (ref 0.6–3.4)
MCH, POC: 28.9 pg (ref 27–31.2)
MCHC: 34 g/dL (ref 31.8–35.4)
MCV: 85 fL (ref 80–97)
MID (cbc): 0.9 (ref 0–0.9)
MPV: 5.9 fL (ref 0–99.8)
POC Granulocyte: 8.5 — AB (ref 2–6.9)
POC LYMPH PERCENT: 19.5 %L (ref 10–50)
POC MID %: 7.4 %M (ref 0–12)
Platelet Count, POC: 293 10*3/uL (ref 142–424)
RBC: 4.76 M/uL (ref 4.69–6.13)
RDW, POC: 15.5 %
WBC: 11.6 10*3/uL — AB (ref 4.6–10.2)

## 2015-05-08 LAB — LIPID PANEL
Cholesterol: 183 mg/dL (ref 125–200)
HDL: 48 mg/dL (ref >=40)
LDL Cholesterol: 116 mg/dL (ref <130)
Total CHOL/HDL Ratio: 3.8 Ratio (ref <=5.0)
Triglycerides: 96 mg/dL (ref <150)
VLDL: 19 mg/dL (ref <30)

## 2015-05-08 LAB — HEPATITIS C ANTIBODY: HCV Ab: NEGATIVE

## 2015-05-08 LAB — HIV ANTIBODY (ROUTINE TESTING W REFLEX): HIV 1&2 Ab, 4th Generation: NONREACTIVE

## 2015-05-08 MED ORDER — ALPRAZOLAM 1 MG PO TABS: 1.0000 mg | ORAL_TABLET | Freq: Three times a day (TID) | ORAL | Status: DC | PRN

## 2015-05-08 MED ORDER — GABAPENTIN 100 MG PO CAPS: 100.0000 mg | ORAL_CAPSULE | Freq: Every day | ORAL | Status: DC

## 2015-05-08 MED ORDER — DICLOFENAC SODIUM 75 MG PO TBEC: 75.0000 mg | DELAYED_RELEASE_TABLET | Freq: Two times a day (BID) | ORAL | Status: DC

## 2015-05-08 MED ORDER — LOVASTATIN 20 MG PO TABS: 20.0000 mg | ORAL_TABLET | Freq: Every day | ORAL | Status: DC

## 2015-05-08 MED ORDER — PREDNISONE 20 MG PO TABS: ORAL_TABLET | ORAL | Status: DC

## 2015-05-08 MED ORDER — AMITRIPTYLINE HCL 50 MG PO TABS: 50.0000 mg | ORAL_TABLET | Freq: Every day | ORAL | Status: DC

## 2015-05-08 NOTE — Progress Notes (Addendum)
Patient ID: Roy Moore MRN: [id number], DOB: Mar 17, 1955 60 y.o. Date of Encounter: 05/08/2015, 1:09 PM  Primary Physician: [name], MD  Chief Complaint: Physical (CPE)  HPI: 60 y.o. y/o male with history noted below here for CPE.  Doing well. Still smoking. Intermittent shoulder stiffness, joint stiffness.  Review of Systems: Consitutional: No fever, chills, fatigue, night sweats, lymphadenopathy, or weight changes. Eyes: No visual changes, eye redness, or discharge. ENT/Mouth: Ears: No otalgia, tinnitus, hearing loss, discharge. Nose: No congestion, rhinorrhea, sinus pain, or epistaxis. Throat: No sore throat, post nasal drip, or teeth pain. Cardiovascular: No CP, palpitations, diaphoresis, DOE, edema, orthopnea, PND. Respiratory: No cough, hemoptysis, SOB, or wheezing. Gastrointestinal: No anorexia, dysphagia, reflux, pain, nausea, vomiting, hematemesis, diarrhea, constipation, BRBPR, or melena. Genitourinary: No dysuria, frequency, urgency, hematuria, incontinence, nocturia, decreased urinary stream, discharge, impotence, or testicular pain/masses. Musculoskeletal: No decreased ROM, myalgias, joint swelling,   Some persistent right arm weakness. Skin: No rash, erythema, lesion changes, pain, warmth, jaundice, or pruritis. Neurological: No headache, dizziness, syncope, seizures, tremors, memory loss, coordination problems, or paresthesias. Psychological: No anxiety, depression, hallucinations, SI/HI. Endocrine: No fatigue, polydipsia, polyphagia, polyuria, or known diabetes. All other systems were reviewed and are otherwise negative.  Past Medical History  Diagnosis Date  . Stroke ([place])   . Kidney stone   . Hyperlipidemia      Past Surgical History  Procedure Laterality Date  . Back surgery      10 yrs ago or so-fusion in neck   . Colonoscopy      15-20 yrs ago-normal per pt.   . Teeth removal      Home Meds:  Prior to Admission medications   Medication  Sig Start Date End Date Taking? Authorizing Provider  ALPRAZolam [name]) 1 MG tablet TAKE ONE TABLET BY MOUTH THREE TIMES DAILY AS NEEDED FOR ANXIETY 05/08/15  Yes [name], MD  amitriptyline (ELAVIL) 50 MG tablet Take 1 tablet (50 mg total) by mouth at bedtime. 11/05/14  Yes [name], MD  aspirin 81 MG chewable tablet Chew 81 mg by mouth daily.    Yes Historical Provider, MD  B Complex Vitamins (VITAMIN B COMPLEX PO) Take 1 tablet by mouth daily.    Yes Historical Provider, MD  Cholecalciferol (VITAMIN D PO) Take 1 tablet by mouth daily.    Yes Historical Provider, MD  diclofenac (VOLTAREN) 75 MG EC tablet TAKE ONE TABLET BY MOUTH TWICE DAILY 04/25/15  Yes [name], MD  gabapentin (NEURONTIN) 100 MG capsule Take 1 capsule (100 mg total) by mouth at bedtime. 04/03/15  Yes [name], MD  lovastatin (MEVACOR) 20 MG tablet Take 1 tablet (20 mg total) by mouth at bedtime. 11/05/14  Yes [name], MD  Thiamine HCl (VITAMIN B-1 PO) Take 1 tablet by mouth daily.    Yes Historical Provider, MD  predniSONE (DELTASONE) 20 MG tablet TAKE 3 TABLETS BY MOUTH EVERY MORNING FOR 3 DAYS, THEN 2 TABS IN THE MORNING FOR 3 DAYS, THEN 1 TAB IN THE MORNING FOR 3 DAYS. Patient not taking: Reported on 05/08/2015 04/25/15   [name], MD    Allergies: No Known Allergies  Social History   Social History  . Marital Status: Married    Spouse Name: [name]  . Number of Children: [name]  . Years of Education: [name]   Occupational History  . Not on file.   Social History Main Topics  . Smoking status: Current Every Day Smoker -- 1.00 packs/day    Types: Cigarettes  .  Smokeless tobacco: Never Used  . Alcohol Use: No  . Drug Use: No  . Sexual Activity: Not on file   Other Topics Concern  . Not on file   Social History Narrative    Family History  Problem Relation Age of Onset  . Colon cancer Neg Hx   . Rectal cancer Neg Hx   . Stomach cancer Neg Hx   . Esophageal cancer Neg Hx       Physical Exam: Blood pressure 136/64, pulse 85, temperature 98 F (36.7 C), temperature source Oral, resp. rate 16, height 5\' 10"  (1.778 m), weight 177 lb (80.287 kg), SpO2 96 %.  BP Readings from Last 3 Encounters:  05/08/15 136/64  04/10/15 175/76  04/03/15 116/60   General: Well developed, well nourished, in no acute distress. HEENT: Normocephalic, atraumatic. Conjunctiva pink, sclera non-icteric. Pupils 2 mm constricting to 1 mm, round, regular, and equally reactive to light and accomodation. EOMI.  Fundi benign   Internal auditory canal clear. TMs with good cone of light and without pathology. Nasal mucosa pink. Nares are without discharge. No sinus tenderness. Oral mucosa pink. Dentition edentulous. Pharynx without exudate.    Neck: Supple. Trachea midline. No thyromegaly. Full ROM. No lymphadenopathy. Lungs: Clear to auscultation bilaterally without wheezes, rales, or rhonchi. Breathing is of normal effort and unlabored. Cardiovascular: RRR with S1 S2. No murmurs, rubs, or gallops appreciated. Distal pulses 2+ symmetrically. No carotid or abdominal bruits Abdomen: Soft, non-tender, non-distended with normoactive bowel sounds. No hepatosplenomegaly or masses. No rebound/guarding. No CVA tenderness. Without hernias.   Genitourinary:  circumcised male. No penile lesions. Testes descended bilaterally, and smooth without tenderness or masses.  Musculoskeletal: Full range of motion and 5/5 strength throughout. Without swelling, atrophy, tenderness, crepitus, or warmth. Extremities without clubbing, cyanosis, or edema. Calves supple. Skin: Warm and moist without erythema, ecchymosis, wounds, or rash. Neuro: A+Ox3. CN II-XII grossly intact. Moves all extremities spontaneously. Full sensation throughout. Normal gait. DTR 2+ throughout upper and lower extremities. Finger to nose intact. Psych:  Responds to questions appropriately with a normal affect.   Lab Results  Component Value Date    CHOL 144 05/09/2014   CHOL 130 10/16/2013   CHOL 169 06/01/2012   Lab Results  Component Value Date   HDL 60 05/09/2014   HDL 55 10/16/2013   HDL 48 06/01/2012   Lab Results  Component Value Date   LDLCALC 70 05/09/2014   LDLCALC 60 10/16/2013   LDLCALC 105* 06/01/2012   Lab Results  Component Value Date   TRIG 72 05/09/2014   TRIG 73 10/16/2013   TRIG 80 06/01/2012   Lab Results  Component Value Date   CHOLHDL 2.4 05/09/2014   CHOLHDL 2.4 10/16/2013   CHOLHDL 3.5 06/01/2012   No results found for: LDLDIRECT  Assessment/Plan:  60 y.o. y/o  male here for CPE   ICD-9-CM ICD-10-CM   1. Annual physical exam V70.0 Z00.00 POCT CBC     POCT urinalysis dipstick     COMPLETE METABOLIC PANEL WITH GFR     Lipid panel     Hepatitis C antibody     HIV antibody  2. Hyperlipidemia 272.4 E78.5 lovastatin (MEVACOR) 20 MG tablet  3. Bilateral sciatica 724.3 M54.31 gabapentin (NEURONTIN) 100 MG capsule    M54.32   4. Pain of both shoulder joints 719.41 M25.511 gabapentin (NEURONTIN) 100 MG capsule    M25.512   5. Insomnia 780.52 G47.00 amitriptyline (ELAVIL) 50 MG tablet    Signed, [name],  MD 05/08/2015 1:09 PM   Flushing ear canals won't help because the wax is not the problem. Rather, fluid has accumulated on the inside of the ear drum.  I will call in amoxicillin and if this doesn't work, I'll refer to ENT

## 2015-05-08 NOTE — Patient Instructions (Signed)

## 2015-05-08 NOTE — Addendum Note (Signed)
Addended by: Burnis Kingfisher on: 05/08/2015 02:44 PM   Modules accepted: Miquel Dunn

## 2015-05-08 NOTE — Telephone Encounter (Signed)
Faxed

## 2015-05-08 NOTE — Addendum Note (Signed)
Addended by: Burnis Kingfisher on: 05/08/2015 01:49 PM   Modules accepted: Orders, SmartSet

## 2015-05-09 MED ORDER — AMOXICILLIN 875 MG PO TABS: 875.0000 mg | ORAL_TABLET | Freq: Two times a day (BID) | ORAL | Status: DC

## 2015-05-09 NOTE — Addendum Note (Signed)
Addended by: [name] on: 05/09/2015 12:59 PM   Modules accepted: Orders, SmartSet

## 2015-06-06 ENCOUNTER — Other Ambulatory Visit: Payer: Self-pay | Admitting: Family Medicine

## 2015-06-18 ENCOUNTER — Ambulatory Visit (INDEPENDENT_AMBULATORY_CARE_PROVIDER_SITE_OTHER): Payer: Commercial Managed Care - HMO | Admitting: Family Medicine

## 2015-06-18 VITALS — BP 148/90 | HR 86 | Temp 97.7°F | Resp 16 | Ht 70.0 in | Wt 179.2 lb

## 2015-06-18 DIAGNOSIS — M5432 Sciatica, left side: Secondary | ICD-10-CM | POA: Diagnosis not present

## 2015-06-18 DIAGNOSIS — G4700 Insomnia, unspecified: Secondary | ICD-10-CM

## 2015-06-18 DIAGNOSIS — F4323 Adjustment disorder with mixed anxiety and depressed mood: Secondary | ICD-10-CM | POA: Diagnosis not present

## 2015-06-18 DIAGNOSIS — M5431 Sciatica, right side: Secondary | ICD-10-CM | POA: Diagnosis not present

## 2015-06-18 DIAGNOSIS — M25511 Pain in right shoulder: Secondary | ICD-10-CM | POA: Diagnosis not present

## 2015-06-18 DIAGNOSIS — M25512 Pain in left shoulder: Secondary | ICD-10-CM

## 2015-06-18 MED ORDER — AMITRIPTYLINE HCL 50 MG PO TABS: 50.0000 mg | ORAL_TABLET | Freq: Every day | ORAL | Status: DC

## 2015-06-18 MED ORDER — ALPRAZOLAM 1 MG PO TABS: 1.0000 mg | ORAL_TABLET | Freq: Three times a day (TID) | ORAL | Status: DC | PRN

## 2015-06-18 MED ORDER — HYDROCODONE-ACETAMINOPHEN 5-325 MG PO TABS: 1.0000 | ORAL_TABLET | Freq: Four times a day (QID) | ORAL | Status: DC | PRN

## 2015-06-18 MED ORDER — GABAPENTIN 100 MG PO CAPS: 100.0000 mg | ORAL_CAPSULE | Freq: Every day | ORAL | Status: DC

## 2015-06-18 MED ORDER — PREDNISONE 10 MG PO TABS: 10.0000 mg | ORAL_TABLET | Freq: Every day | ORAL | Status: DC

## 2015-06-18 MED ORDER — DICLOFENAC SODIUM 75 MG PO TBEC: 75.0000 mg | DELAYED_RELEASE_TABLET | Freq: Two times a day (BID) | ORAL | Status: DC

## 2015-06-18 NOTE — Progress Notes (Signed)
60 yo disabled man with myalgias that migrate since his CVA back in 2009 that left right side somewhat less functional, although he has regained much of his strength.  He gets frequent sinus infections.  He lives with his disabled wife.  His son is a Insurance underwriter.  Objective:  NAD  BP recheck 124/64 BP 148/90 mmHg  Pulse 86  Temp(Src) 97.7 F (36.5 C) (Oral)  Resp 16  Ht 5\' 10"  (1.778 m)  Wt 179 lb 3.2 oz (81.285 kg)  BMI 25.71 kg/m2  SpO2 97% Throat:  Clear with PND Ears:  Thickened TM's with mild retraction Neck:  No adenopathy or thyromegaly Chest:  Clear Heart:  Regular, no murmur Hips:  Good ROM  Assessment:  Insomnia - Plan: amitriptyline (ELAVIL) 50 MG tablet, ALPRAZolam (XANAX) 1 MG tablet  Bilateral sciatica - Plan: gabapentin (NEURONTIN) 100 MG capsule, HYDROcodone-acetaminophen (NORCO) 5-325 MG tablet  Pain of both shoulder joints - Plan: diclofenac (VOLTAREN) 75 MG EC tablet, gabapentin (NEURONTIN) 100 MG capsule, predniSONE (DELTASONE) 10 MG tablet, HYDROcodone-acetaminophen (NORCO) 5-325 MG tablet  Adjustment disorder with mixed anxiety and depressed mood - Plan: ALPRAZolam [name]) 1 MG tablet  [name], MD

## 2015-08-06 ENCOUNTER — Telehealth: Payer: Self-pay

## 2015-08-06 NOTE — Telephone Encounter (Signed)
Pt is checking on status of a refill reqest for prednizonw  Best number [phone]

## 2015-08-06 NOTE — Telephone Encounter (Signed)
RTC

## 2015-08-07 ENCOUNTER — Ambulatory Visit (INDEPENDENT_AMBULATORY_CARE_PROVIDER_SITE_OTHER): Payer: Commercial Managed Care - HMO | Admitting: Family Medicine

## 2015-08-07 ENCOUNTER — Ambulatory Visit (INDEPENDENT_AMBULATORY_CARE_PROVIDER_SITE_OTHER): Payer: Commercial Managed Care - HMO

## 2015-08-07 ENCOUNTER — Telehealth: Payer: Self-pay

## 2015-08-07 VITALS — BP 126/80 | HR 82 | Temp 97.9°F | Resp 18 | Ht 70.0 in | Wt 175.0 lb

## 2015-08-07 DIAGNOSIS — M5442 Lumbago with sciatica, left side: Secondary | ICD-10-CM

## 2015-08-07 DIAGNOSIS — M5431 Sciatica, right side: Secondary | ICD-10-CM | POA: Diagnosis not present

## 2015-08-07 DIAGNOSIS — M25511 Pain in right shoulder: Secondary | ICD-10-CM | POA: Diagnosis not present

## 2015-08-07 DIAGNOSIS — M51369 Other intervertebral disc degeneration, lumbar region without mention of lumbar back pain or lower extremity pain: Secondary | ICD-10-CM

## 2015-08-07 DIAGNOSIS — M5136 Other intervertebral disc degeneration, lumbar region: Secondary | ICD-10-CM | POA: Diagnosis not present

## 2015-08-07 DIAGNOSIS — M25532 Pain in left wrist: Secondary | ICD-10-CM | POA: Diagnosis not present

## 2015-08-07 DIAGNOSIS — M542 Cervicalgia: Secondary | ICD-10-CM

## 2015-08-07 DIAGNOSIS — M5432 Sciatica, left side: Secondary | ICD-10-CM

## 2015-08-07 DIAGNOSIS — M5030 Other cervical disc degeneration, unspecified cervical region: Secondary | ICD-10-CM | POA: Diagnosis not present

## 2015-08-07 DIAGNOSIS — M25512 Pain in left shoulder: Secondary | ICD-10-CM | POA: Diagnosis not present

## 2015-08-07 DIAGNOSIS — M47812 Spondylosis without myelopathy or radiculopathy, cervical region: Secondary | ICD-10-CM | POA: Diagnosis not present

## 2015-08-07 MED ORDER — MELOXICAM 7.5 MG PO TABS
7.5000 mg | ORAL_TABLET | Freq: Every day | ORAL | Status: DC
Start: 2015-08-07 — End: 2015-11-28

## 2015-08-07 MED ORDER — HYDROCODONE-ACETAMINOPHEN 5-325 MG PO TABS: 0.5000 | ORAL_TABLET | Freq: Four times a day (QID) | ORAL | Status: DC | PRN

## 2015-08-07 NOTE — Telephone Encounter (Signed)
Pt request refill on Prednisone and his wife states Dr. Joseph Art said to call here for a refill and Dr. Carlota Raspberry would refill it. I do not see any notes about this. Dr. Carlota Raspberry will you advise.

## 2015-08-07 NOTE — Telephone Encounter (Signed)
Seen in office.

## 2015-08-07 NOTE — Patient Instructions (Addendum)
     IF you received an x-ray today, you will receive an invoice from [facility]. Please contact [facility] at [phone] with questions or concerns regarding your invoice.   IF you received labwork today, you will receive an invoice from [employer]. Please contact [facility] at [phone] with questions or concerns regarding your invoice.   Our billing staff will not be able to assist you with questions regarding bills from these companies.  You will be contacted with the lab results as soon as they are available. The fastest way to get your results is to activate your My Chart account. Instructions are located on the last page of this paperwork. If you have not heard from [place] regarding the results in 2 weeks, please contact this office.   You can try the wrist brace for left wrist pain for the next 1-2 weeks at the most. I will change your anti-inflammatory to meloxicam 1-2 each day. Do not combine this with other over-the-counter anti-inflammatories. If needed for breakthrough pain, you can try half to one of the hydrocodone as needed only. For your neck and back pain, I will refer you to neurosurgery, as you have had multiple prednisone tapers already, and do not feel comfortable refilling this again at this time. For your shoulder pain, and wrist pain I will refer you to orthopedics. If any worsening of symptoms as we discussed prior to those visits, return here or the emergency room.

## 2015-08-07 NOTE — Telephone Encounter (Signed)
Left message on machine to RTC.

## 2015-08-07 NOTE — Progress Notes (Signed)
Subjective:  By signing my name below, I, [name], attest that this documentation has been prepared under the direction and in the presence of [name], MD. Electronically Signed: [name], Bryce. 08/07/2015 , 12:53 PM .  Patient was seen in Room 5 .   Patient ID: Roy Moore, male    DOB: 15-Mar-1955, 60 y.o.   MRN: [id number] Chief Complaint  Patient presents with  . Sciatica    pain all over   HPI Roy Moore is a 60 y.o. male Here with back pain, last seen in May by Dr. Joseph Art for diffuse myalgias that migrate into his CVA since 2009 per that note. He was diagnosed with bilateral sciatica treated with neurontin 100mg  at bed time, prednisone taper, voltaren, and elavil. He called yesterday for refill of prednisone. There is no recent imaging of lumbar spine other than DDD noted in L3-4 and L4-5 on March 2015 on abdomen CT. His last lumbar spine MRI was in 2005. At that time, he had disc disease in L3-4 and L4-5 with nerve root impingement. He was seen by Dr. Annette Stable for neurosurgery. He had an xray of his right shoulder done in February without bony abnormality.   Patient complains of bilateral arm pain and some pain down into his legs bilaterally. He notices the pain more in his arms, starting from his shoulders down to his upper arm ongoing for about a year. He states it's been difficult to move his right shoulder for about a year. He was taking voltaren while taking prednisone; was advised not to in the future. When he was on the prednisone, he noticed the swelling improved; however, the pain flares again after a few days after finishing the prednisone. He was given a prednisone prescription on May 23rd, and was also given another prednisone prescription on June 18th. He denies weakness, urinary or bowel incontinence. He denies recent alcohol use. He denies any recent injury to the areas. He hasn't seen Dr. Annette Stable for at least 10 years now.   He also complains of  left wrist swelling and wasn't able to make a fist yesterday. This has been an ongoing intermittent issue which worsened over the past couple of days. Historically, he has some pain and swelling with trouble gripping, but better today.   Patient Active Problem List   Diagnosis Date Noted  . CVA, old, hemiparesis ([place]) 03/24/2011  . ANXIETY DISORDER 10/04/2007  . TOBACCO ABUSE 09/30/2007  . GERD 09/30/2007  . DYSLIPIDEMIA 09/15/2007  . STROKE WITH LEFT HEMIPARESIS 09/15/2007   Past Medical History  Diagnosis Date  . Stroke ([place])   . Kidney stone   . Hyperlipidemia    Past Surgical History  Procedure Laterality Date  . Back surgery      10 yrs ago or so-fusion in neck   . Colonoscopy      15-20 yrs ago-normal per pt.   . Teeth removal     No Known Allergies Prior to Admission medications   Medication Sig Start Date End Date Taking? Authorizing Provider  ALPRAZolam [name]) 1 MG tablet Take 1 tablet (1 mg total) by mouth 3 (three) times daily as needed. for anxiety 06/18/15  Yes [name], MD  amitriptyline (ELAVIL) 50 MG tablet Take 1 tablet (50 mg total) by mouth at bedtime. 06/18/15  Yes [name], MD  aspirin 81 MG chewable tablet Chew 81 mg by mouth daily.    Yes Historical Provider, MD  B Complex Vitamins (VITAMIN B  COMPLEX PO) Take 1 tablet by mouth daily.    Yes Historical Provider, MD  Cholecalciferol (VITAMIN D PO) Take 1 tablet by mouth daily.    Yes Historical Provider, MD  diclofenac (VOLTAREN) 75 MG EC tablet Take 1 tablet (75 mg total) by mouth 2 (two) times daily. 06/18/15  Yes [name], MD  gabapentin (NEURONTIN) 100 MG capsule Take 1 capsule (100 mg total) by mouth at bedtime. 06/18/15  Yes [name], MD  lovastatin (MEVACOR) 20 MG tablet Take 1 tablet (20 mg total) by mouth at bedtime. 05/08/15  Yes [name], MD  Thiamine HCl (VITAMIN B-1 PO) Take 1 tablet by mouth daily.    Yes Historical Provider, MD  HYDROcodone-acetaminophen (NORCO)  5-325 MG tablet Take 1 tablet by mouth every 6 (six) hours as needed for moderate pain. Patient not taking: Reported on 08/07/2015 06/18/15   [name], MD  predniSONE (DELTASONE) 10 MG tablet Take 1 tablet (10 mg total) by mouth daily with breakfast. 3-3-3-2-2-2-1-1-1 Patient not taking: Reported on 08/07/2015 06/18/15   [name], MD   Social History   Social History  . Marital Status: Married    Spouse Name: [name]  . Number of Children: [name]  . Years of Education: [name]   Occupational History  . Not on file.   Social History Main Topics  . Smoking status: Current Every Day Smoker -- 1.00 packs/day    Types: Cigarettes  . Smokeless tobacco: Never Used  . Alcohol Use: No  . Drug Use: No  . Sexual Activity: Not on file   Other Topics Concern  . Not on file   Social History Narrative   Review of Systems  Constitutional: Negative for fever, chills and fatigue.  Gastrointestinal: Negative for nausea, vomiting and diarrhea.  Musculoskeletal: Positive for myalgias, back pain, arthralgias and neck pain. Negative for joint swelling and gait problem.  Skin: Negative for rash and wound.       Objective:   Physical Exam  Constitutional: He is oriented to person, place, and time. He appears well-developed and well-nourished. No distress.  HENT:  Head: Normocephalic and atraumatic.  Eyes: EOM are normal. Pupils are equal, round, and reactive to light.  Neck: Neck supple.  Cardiovascular: Normal rate.   Pulmonary/Chest: Effort normal. No respiratory distress.  Musculoskeletal: Normal range of motion.  C-spine: decreased ROM diffusely of C-spine, pain with left rotation, tender along mid C-spine, no appreciable tenderness along paraspinal muscles Shoulders: equal ROM with some decreased abduction and flexion on left but discomfort with abduction, rotator cuff strength intact, some limited in passive ROM of right shoulder, decreased passive flexion and abduction L-spine: flexion  to 80 degrees, minimal extension, slight decreased left greater than right lateral flexion Left wrist: minimal ROM of left wrist  Neurological: He is alert and oriented to person, place, and time. He displays no Babinski's sign on the right side. He displays no Babinski's sign on the left side.  Reflex Scores:      Tricep reflexes are 2+ on the right side and 2+ on the left side.      Bicep reflexes are 2+ on the right side and 2+ on the left side.      Brachioradialis reflexes are 2+ on the right side and 2+ on the left side.      Patellar reflexes are 2+ on the right side and 2+ on the left side.      Achilles reflexes are 2+ on the right side and 2+  on the left side. Able to heel-toe walk  Skin: Skin is warm and dry.  Psychiatric: He has a normal mood and affect. His behavior is normal.  Nursing note and vitals reviewed.   Filed Vitals:   08/07/15 1201  BP: 126/80  Pulse: 82  Temp: 97.9 F (36.6 C)  TempSrc: Oral  Resp: 18  Height: 5\' 10"  (1.778 m)  Weight: 175 lb (79.379 kg)  SpO2: 97%   Dg Cervical Spine 2 Or 3 Views  08/07/2015  CLINICAL DATA:  Bilateral arm.  Neck pain EXAM: CERVICAL SPINE - 2-3 VIEW COMPARISON:  03/12/2004 FINDINGS: ACDF C3 through C6 unchanged from the prior study. Solid fusion C3 through C6. Disc degeneration and spurring at C2-3 with mild progression. Mild disc degeneration and spurring at C6-7. Negative for fracture. IMPRESSION: Solid fusion C3 through C6. Mild degenerative change in spurring at C2-3 and C6-7. Electronically Signed   By: Franchot Gallo M.D.   On: 08/07/2015 13:30      Assessment & Plan:   Roy Moore is a 60 y.o. male Left wrist pain - Plan: DG Wrist Complete Left, meloxicam (MOBIC) 7.5 MG tablet, Splint wrist, Ambulatory referral to Orthopedic Surgery  - With reports of prior swelling. Possible degenerative disease. Trial of meloxicam, wrist splint, and refer to orthopedics for further evaluation.  Pain of both shoulder joints -  Plan: HYDROcodone-acetaminophen (NORCO) 5-325 MG tablet Pain in joint of right shoulder - Plan: meloxicam (MOBIC) 7.5 MG tablet, Ambulatory referral to Orthopedic Surgery  - Rotator cuff syndrome versus OA. Recurrent, status post multiple previous injections by report. Previously bilateral, primarily right-sided now. Refer to orthopedics for further evaluation, meloxicam daily prn for now.  DDD (degenerative disc disease), cervical - Plan: DG Cervical Spine 2 or 3 views, meloxicam (MOBIC) 7.5 MG tablet, Ambulatory referral to Neurosurgery Neck pain - Plan: DG Cervical Spine 2 or 3 views, meloxicam (MOBIC) 7.5 MG tablet, Ambulatory referral to Neurosurgery  - Recurrent, status post multiple prednisone tapers recently. Discussed risks with this medication, and need for follow-up with his neurosurgeon. Meloxicam prescription, hydrocodone prescription for breakthrough pain was given. Short course only discussed. If persistent need, consider pain management.  Left-sided low back pain with left-sided sciatica - Plan: DG Lumbar Spine 2-3 Views, meloxicam (MOBIC) 7.5 MG tablet, Ambulatory referral to Neurosurgery DDD (degenerative disc disease), lumbar - Plan: DG Lumbar Spine 2-3 Views, meloxicam (MOBIC) 7.5 MG tablet, Ambulatory referral to Neurosurgery Bilateral sciatica - Plan: HYDROcodone-acetaminophen ([place]) 5-325 MG tablet, Ambulatory referral to Neurosurgery  - As with cervical spine disease, discussed risks of repetitive prednisone tapers. Deferred prednisone at present time, okay to continue meloxicam for now, hydrocodone if needed for breakthrough pain. Refer to neurosurgery as above.  RTC  precautions if worsening of symptoms.   Meds ordered this encounter  Medications  . meloxicam (MOBIC) 7.5 MG tablet    Sig: Take 1-2 tablets (7.5-15 mg total) by mouth daily.    Dispense:  30 tablet    Refill:  0  . HYDROcodone-acetaminophen (NORCO) 5-325 MG tablet    Sig: Take 0.5-1 tablets by mouth  every 6 (six) hours as needed for moderate pain.    Dispense:  15 tablet    Refill:  0   Patient Instructions       IF you received an x-ray today, you will receive an invoice from [facility]. Please contact [facility] at [phone] with questions or concerns regarding your invoice.   IF you received labwork today,  you will receive an invoice from [employer]. Please contact [facility] at [phone] with questions or concerns regarding your invoice.   Our billing staff will not be able to assist you with questions regarding bills from these companies.  You will be contacted with the lab results as soon as they are available. The fastest way to get your results is to activate your My Chart account. Instructions are located on the last page of this paperwork. If you have not heard from [place] regarding the results in 2 weeks, please contact this office.   You can try the wrist brace for left wrist pain for the next 1-2 weeks at the most. I will change your anti-inflammatory to meloxicam 1-2 each day. Do not combine this with other over-the-counter anti-inflammatories. If needed for breakthrough pain, you can try half to one of the hydrocodone as needed only. For your neck and back pain, I will refer you to neurosurgery, as you have had multiple prednisone tapers already, and do not feel comfortable refilling this again at this time. For your shoulder pain, and wrist pain I will refer you to orthopedics. If any worsening of symptoms as we discussed prior to those visits, return here or the emergency room.     I personally performed the services described in this documentation, which was scribed in my presence. The recorded information has been reviewed and considered, and addended by me as needed.   Signed,   [name], MD [facility].  08/10/2015 3:13 PM

## 2015-08-28 ENCOUNTER — Telehealth: Payer: Self-pay

## 2015-08-28 NOTE — Telephone Encounter (Signed)
Dr. Carlota Raspberry you evaluated pt last. Please advise.

## 2015-08-28 NOTE — Telephone Encounter (Signed)
See previous message.  I informed the patient's wife that it would take longer to refer to another surgeon.  We would have to resubmit the referral to another location, they would have a consult with the patient, and then they would have to schedule surgery.  The wife said he would just keep the appointment for surgery with Dr Trenton Gammon in 2 weeks.  The patient would like a refill of Prednisone to take until he has his surgery in two weeks. He was a patient of Dr Joseph Art, but has since switched to Dr Carlota Raspberry.  He was prescribed meloxicam by Dr Carlota Raspberry, but his wife said that this is not helping his pain.  The Prednisone was much more effective.  Please advise on refill for Prednisone.  CB#: [phone]

## 2015-08-28 NOTE — Telephone Encounter (Signed)
PATIENT'S WIFE (Roy Moore) [place] DR. GREENE TO KNOW THAT Roy Moore HAD AN APPOINTMENT TO SEE THE SURGEON, BUT HIS OFFICE CALLED THIS MORNING TO SAY DR. Trenton Gammon HAD A DEATH IN HIS FAMILY AND HIS APPOINTMENT WOULD HAVE TO BE RESCHEDULED FOR 2 WEEKS. Roy WOULD LIKE DR. GREENE TO SUGGEST ANOTHER SURGEON BECAUSE Roy Moore IS IN A LOT OF PAIN WITH HIS SCIATICA. HE CAN'T WALK AND HIS ARMS HURT. BEST PHONE [phone] (CELL) [place]

## 2015-08-29 MED ORDER — PREDNISONE 20 MG PO TABS: ORAL_TABLET | ORAL | 0 refills | Status: DC

## 2015-08-29 NOTE — Telephone Encounter (Signed)
I called pt, unable to leave voicemail.

## 2015-08-29 NOTE — Telephone Encounter (Signed)
Phone calls noted.  As we discussed at his last visit, I did have concerns with the amount of prednisone he has been taking. Due to the delay in seeing neurosurgery, I will agree to try another course of prednisone, but if that is not helping, needs to return to clinic for further assessment. If any worsening of symptoms prior to neurosurgery eval, return to clinic or emergency room. He should not take meloxicam or any anti-inflammatory medicine such as ibuprofen or Aleve while he is taking the prednisone. Make sure that is understood, and let me know if there are other questions.

## 2015-08-30 NOTE — Telephone Encounter (Signed)
Please call Dr. [name] office to make sure the recent prednisone prescription will not affect his upcoming surgery. If so, let us know when he needs to discontinue that. Thanks.

## 2015-08-30 NOTE — Telephone Encounter (Signed)
I was able to reach pt's wife and advised to make sure pt is aware not to take his meloxicam or any other NSAIDS with prednisone. Wife agreed and stated that he doesn't really take those anyway because they are not effective. She also reported that pts surgery w/ Dr Trenton Gammon has been rescheduled for 09/11/15.

## 2015-09-03 NOTE — Telephone Encounter (Signed)
Spoke with Wells Guiles from [facility] states if the Rx was already written and given to pt, then it is ok. If you are wanting to start another round, then they do not recommend it.

## 2015-09-03 NOTE — Telephone Encounter (Signed)
Left message for pt to call back. Left message on VM for his CMA to call back.

## 2015-09-26 ENCOUNTER — Telehealth: Payer: Self-pay

## 2015-09-26 DIAGNOSIS — M4806 Spinal stenosis, lumbar region: Secondary | ICD-10-CM | POA: Diagnosis not present

## 2015-09-26 DIAGNOSIS — Z6826 Body mass index (BMI) 26.0-26.9, adult: Secondary | ICD-10-CM | POA: Diagnosis not present

## 2015-09-26 DIAGNOSIS — R030 Elevated blood-pressure reading, without diagnosis of hypertension: Secondary | ICD-10-CM | POA: Diagnosis not present

## 2015-09-26 NOTE — Telephone Encounter (Signed)
Pt's wife called in wanting a refill on her husband's predniSONE (DELTASONE) 20 MG tablet [id number]. Pharmacy:  [place], [place] - [address]. Harald did see Dr. Trenton Gammon today 09/26/15 , who Dr. Carlota Raspberry referred him to. Please advise at [phone]

## 2015-09-27 ENCOUNTER — Other Ambulatory Visit: Payer: Self-pay | Admitting: Neurosurgery

## 2015-09-27 DIAGNOSIS — M48061 Spinal stenosis, lumbar region without neurogenic claudication: Secondary | ICD-10-CM

## 2015-09-27 NOTE — Telephone Encounter (Signed)
Pt checking on status of this message.  °

## 2015-09-28 NOTE — Telephone Encounter (Signed)
I would need to see him to refill prednisone and to discuss a plan. I would not be comfortable refilling his prednisone unless his neurosurgeon thought this was appropriate.  What was his neurosurgeon's plan?

## 2015-10-01 ENCOUNTER — Telehealth: Payer: Self-pay | Admitting: Emergency Medicine

## 2015-10-01 NOTE — Telephone Encounter (Signed)
Spoke to patient regarding medication Prednisone RF He went to see the Neuro surgeon 8/31, imaging ordered  Pt told to f/u with provider for RF He will schedule OV after 4 today

## 2015-10-01 NOTE — Telephone Encounter (Signed)
Noted. Had requested a refill of prednisone previously, and in notes from his neurosurgeons office, recommended against further refill until he was seen. Now that he has been seen by neurosurgery, would want to discuss plan with their office if I'm being asked to refill prednisone instead of their office.

## 2015-10-01 NOTE — Telephone Encounter (Signed)
Left a message to return call regarding Prednisone refill.

## 2015-10-03 ENCOUNTER — Ambulatory Visit (INDEPENDENT_AMBULATORY_CARE_PROVIDER_SITE_OTHER): Payer: Commercial Managed Care - HMO | Admitting: Family Medicine

## 2015-10-03 ENCOUNTER — Encounter: Payer: Self-pay | Admitting: Family Medicine

## 2015-10-03 ENCOUNTER — Ambulatory Visit (INDEPENDENT_AMBULATORY_CARE_PROVIDER_SITE_OTHER): Payer: Commercial Managed Care - HMO

## 2015-10-03 VITALS — BP 130/60 | HR 91 | Temp 98.1°F | Resp 16 | Ht 70.5 in | Wt 167.0 lb

## 2015-10-03 DIAGNOSIS — R739 Hyperglycemia, unspecified: Secondary | ICD-10-CM

## 2015-10-03 DIAGNOSIS — M1612 Unilateral primary osteoarthritis, left hip: Secondary | ICD-10-CM | POA: Diagnosis not present

## 2015-10-03 DIAGNOSIS — M545 Low back pain, unspecified: Secondary | ICD-10-CM

## 2015-10-03 DIAGNOSIS — R294 Clicking hip: Secondary | ICD-10-CM | POA: Diagnosis not present

## 2015-10-03 DIAGNOSIS — G8929 Other chronic pain: Secondary | ICD-10-CM

## 2015-10-03 DIAGNOSIS — M25552 Pain in left hip: Secondary | ICD-10-CM | POA: Diagnosis not present

## 2015-10-03 DIAGNOSIS — M25472 Effusion, left ankle: Secondary | ICD-10-CM

## 2015-10-03 DIAGNOSIS — M5432 Sciatica, left side: Secondary | ICD-10-CM

## 2015-10-03 DIAGNOSIS — Z131 Encounter for screening for diabetes mellitus: Secondary | ICD-10-CM

## 2015-10-03 DIAGNOSIS — M7989 Other specified soft tissue disorders: Secondary | ICD-10-CM | POA: Diagnosis not present

## 2015-10-03 LAB — GLUCOSE, POCT (MANUAL RESULT ENTRY): POC Glucose: 114 mg/dl — AB (ref 70–99)

## 2015-10-03 LAB — URIC ACID: Uric Acid, Serum: 5 mg/dL (ref 4.0–8.0)

## 2015-10-03 MED ORDER — PREDNISONE 20 MG PO TABS: ORAL_TABLET | ORAL | 0 refills | Status: DC

## 2015-10-03 NOTE — Progress Notes (Signed)
By signing my name below, I, [name], attest that this documentation has been prepared under the direction and in the presence of [name].  Electronically Signed: [name], Medical Scribe. 10/03/15. 8:30 AM.  Subjective:    Patient ID: Roy Moore, male    DOB: 06-Jan-1956, 60 y.o.   MRN: [id number]  HPI Chief Complaint  Patient presents with  . Follow-up    SCIATICA -will see Dr Trenton Gammon    HPI Comments: Roy Moore is a 60 y.o. male with a PMHx of low back pain, and sciatica who presents to the [facility] for sciatica follow-up. Prev pt of Dr. Joseph Art. Saw him July 12th. He has been a pt of Dr. Trenton Gammon with neuro surgery and has been treated with multiple tapers of prednisone in the past. When seen in July he had multiple prednisone recent to that visit so discussed meloxicam, short course of hydrocodone if needed, and to follow-up with his neuro surgeon if needed. See Aug 2nd telephone message, apparently there was a delay in seeing his Chief of Staff, so I agreed to another prednisone taper. With appt with Dr. Trenton Gammon Aug 16th, pre phone note with [facility], okay to continue prednisone at that time prior to neuro surgery evaluation, but recommended against starting another round Telephone note Aug 31st, reportedly had imaging done through neuro surgery, but apparently asked to follow up here for med refill. Advised to return to the clinic here to further discuss prednisone and plan for his back pain. He was also treated with Neurontin back in May  Pt is having a visit with Dr. Trenton Gammon and needs a x-ray in his back before being treated for his back pain- pt isn't aware of his next visit. Pt reports lower back pain that radiates to his left leg, intermittent left foot swelling for 2-3 weeks, episodic clicking in his hip, and weakness in his leg. Pt has to lay on his side and put a pillow in between his legs for relief to his symptoms. Pt hasn't  been taking medication for his symptoms, but mentions prednisone helped his symptoms last time he felt his symptoms. Pt denies bowel incontinence, saddle anesthesia, previous injury, and PMHx of gout.  Called Dr. Irven [place] office, MRI has been ordered but not yet authorized. They are waiting for MRI results prior for ordering medication  Patient Active Problem List   Diagnosis Date Noted  . CVA, old, hemiparesis ([place]) 03/24/2011  . ANXIETY DISORDER 10/04/2007  . TOBACCO ABUSE 09/30/2007  . GERD 09/30/2007  . DYSLIPIDEMIA 09/15/2007  . STROKE WITH LEFT HEMIPARESIS 09/15/2007   Past Medical History:  Diagnosis Date  . Hyperlipidemia   . Kidney stone   . Stroke [facility])    Past Surgical History:  Procedure Laterality Date  . BACK SURGERY     10 yrs ago or so-fusion in neck   . COLONOSCOPY     15-20 yrs ago-normal per pt.   . teeth removal     No Known Allergies Prior to Admission medications   Medication Sig Start Date End Date Taking? Authorizing Provider  ALPRAZolam [name]) 1 MG tablet Take 1 tablet (1 mg total) by mouth 3 (three) times daily as needed. for anxiety 06/18/15  Yes [name], MD  amitriptyline (ELAVIL) 50 MG tablet Take 1 tablet (50 mg total) by mouth at bedtime. 06/18/15  Yes [name], MD  aspirin 81 MG chewable tablet Chew 81 mg by mouth daily.  Yes Historical Provider, MD  B Complex Vitamins (VITAMIN B COMPLEX PO) Take 1 tablet by mouth daily.    Yes Historical Provider, MD  Cholecalciferol (VITAMIN D PO) Take 1 tablet by mouth daily.    Yes Historical Provider, MD  lovastatin (MEVACOR) 20 MG tablet Take 1 tablet (20 mg total) by mouth at bedtime. 05/08/15  Yes [name], MD  gabapentin (NEURONTIN) 100 MG capsule Take 1 capsule (100 mg total) by mouth at bedtime. Patient not taking: Reported on 10/03/2015 06/18/15   [name], MD  HYDROcodone-acetaminophen [facility]) 5-325 MG tablet Take 0.5-1 tablets by mouth every 6 (six) hours as needed for  moderate pain. Patient not taking: Reported on 10/03/2015 08/07/15   [name], MD  meloxicam (MOBIC) 7.5 MG tablet Take 1-2 tablets (7.5-15 mg total) by mouth daily. 08/07/15   [name], MD  predniSONE (DELTASONE) 20 MG tablet 3 by mouth for 3 days, then 2 by mouth for 2 days, then 1 by mouth for 2 days, then 1/2 by mouth for 2 days. 08/29/15   [name], MD  Thiamine HCl (VITAMIN B-1 PO) Take 1 tablet by mouth daily.     Historical Provider, MD   Social History   Social History  . Marital status: Married    Spouse name: [name]  . Number of children: [name]  . Years of education: [name]   Occupational History  . Not on file.   Social History Main Topics  . Smoking status: Current Every Day Smoker    Packs/day: 1.00    Types: Cigarettes  . Smokeless tobacco: Never Used  . Alcohol use No  . Drug use: No  . Sexual activity: Not on file   Other Topics Concern  . Not on file   Social History Narrative  . No narrative on file   Depression screen [facility] 2/9 10/03/2015 08/07/2015 06/18/2015 05/08/2015 04/03/2015  Decreased Interest 0 0 0 0 0  Down, Depressed, Hopeless 0 0 0 0 0  PHQ - 2 Score 0 0 0 0 0   Review of Systems  Musculoskeletal: Positive for arthralgias and back pain.  Neurological: Positive for weakness.   Objective:  Physical Exam  Constitutional: He appears well-developed and well-nourished. No distress.  HENT:  Head: Normocephalic and atraumatic.  Eyes: Conjunctivae are normal.  Neck: Neck supple.  Cardiovascular: Normal rate.   Pulmonary/Chest: Effort normal.  Musculoskeletal:  Left calf is non tender, no redness Left ankle fullness of the left medial ankle Tender medial malleolar of left ankle Tender along anterior ankle, no redness Seated straight leg raise, pain behind the left left only Back: 90 degrees flexion of the lumbar spine, Guarded with minimal lateral flexion Hip: Pain free external and internal ROM in the left hip, Describes the site of  clicking anterior upper thigh, not hip proper  Neurological: He is alert. He displays no Babinski's sign on the right side. He displays no Babinski's sign on the left side.  Reflex Scores:      Patellar reflexes are 2+ on the right side and 2+ on the left side.      Achilles reflexes are 2+ on the right side and 2+ on the left side. discarded heel to toe test, but lower extremity strength seems to be intact  Skin: Skin is warm and dry.  Psychiatric: He has a normal mood and affect. His behavior is normal.  Nursing note and vitals reviewed.  BP 130/60 (BP Location: Left Arm, Patient Position:  Sitting, Cuff Size: Normal)   Pulse 91   Temp 98.1 F (36.7 C) (Oral)   Resp 16   Ht 5' 10.5" (1.791 m)   Wt 167 lb (75.8 kg)   SpO2 98%   BMI 23.62 kg/m    Results for orders placed or performed in visit on 10/03/15  POCT glucose (manual entry)  Result Value Ref Range   POC Glucose 114 (A) 70 - 99 mg/dl   Dg Ankle Complete Left  Result Date: 10/03/2015 CLINICAL DATA:  Left ankle swelling 2-3 weeks, no known injury. EXAM: LEFT ANKLE COMPLETE - 3+ VIEW COMPARISON:  None. FINDINGS: Ankle mortise intact. The talar dome is normal. No malleolar fracture. The calcaneus is normal. IMPRESSION: No acute osseous abnormality. Electronically Signed   By: Suzy Bouchard M.D.   On: 10/03/2015 09:24   Dg Hip Unilat W Or W/o Pelvis 2-3 Views Left  Result Date: 10/03/2015 CLINICAL DATA:  Hip clicking.  Weakness.  No known injury. EXAM: DG HIP (WITH OR WITHOUT PELVIS) 2-3V LEFT COMPARISON:  CT 04/14/2013. FINDINGS: Degenerative changes lumbar spine and both hips. No acute bony or joint abnormality identified. No evidence of fracture dislocation. Tiny sclerotic foci in the left ilium and left femoral head most likely tiny bone islands. These are stable. Aortoiliac atherosclerotic vascular calcification. IMPRESSION: 1. Degenerative changes lumbar spine and both hips. No acute bony abnormality. 2. Aortoiliac  atherosclerotic vascular disease. Electronically Signed   By: Marcello Moores  Register   On: 10/03/2015 09:26   Assessment & Plan:   Roy Moore is a 60 y.o. male Left ankle swelling - Plan: Uric Acid, DG Ankle Complete Left, predniSONE (DELTASONE) 20 MG tablet  - no known injury, calf nontender, no apparent calf swelling. Inflammatory arthritis such as gout possible.check uric acid.   -Based on timing of symptoms, and negative XR,  did agree to write prednisone taper to cover for possible gout as well as his sciatic symptoms. SED.   Chronic low back pain - Plan: DG Ankle Complete Left Left sciatic nerve pain - Plan: predniSONE (DELTASONE) 20 MG tablet Left hip pain - Plan: DG HIP UNILAT W OR W/O PELVIS 2-3 VIEWS LEFT  - History of chronic degenerative disc disease, under care of neurosurgery, but has not had MRI yet. Until MRI has been completed, unable to receive medication from neurosurgery. Based on his improvement in the past with prednisone taper,  did agree to try this one more time while waiting on MRI to be completed. Risks of prednisone were discussed, including repetitive use. Understanding expressed. RTC precautions given.  Hip click - Plan: DG HIP UNILAT W OR W/O PELVIS 2-3 VIEWS LEFT  - No pain with internal/external rotation of hip. Some degenerative disease noted. May be snapping hip syndrome, versus osteoarthritis and crepitus. Follow-up if not improving with prednisone, sooner if worse.   Hyperglycemia - Plan: Hemoglobin A1C, Screening for diabetes mellitus - Plan: POCT glucose (manual entry)  - Mildly elevated glucose in office, will check A1c, and if significantly elevated, may need repeat office visit in the next few days as he was started on prednisone today.   Meds ordered this encounter  Medications  . predniSONE (DELTASONE) 20 MG tablet    Sig: 3 by mouth for 3 days, then 2 by mouth for 2 days, then 1 by mouth for 2 days, then 1/2 by mouth for 2 days.    Dispense:  16  tablet    Refill:  0   Patient Instructions  Your hip and back x-rays indicated arthritis. Ankle x-ray overall appeared okay. With the swelling, gout is a possibility, or other inflammation within the joint. I will prescribe prednisone one more time today, but further plan to be discussed with neurosurgery after your MRI. Tylenol is okay to take with prednisone, but no other over-the-counter medicine. I did check a gout test for your ankle swelling, but I would like you to follow-up within the next 2 weeks if that has not completely improved. Sooner if worse.  I am checking a 3 month blood sugar test as yours was borderline elevated today, and with prednisone use, this can make blood sugar worse.  Return to the clinic or go to the nearest emergency room if any of your symptoms worsen or new symptoms occur.   IF you received an x-ray today, you will receive an invoice from [facility]. Please contact [facility] at [phone] with questions or concerns regarding your invoice.   IF you received labwork today, you will receive an invoice from [employer]. Please contact [facility] at [phone] with questions or concerns regarding your invoice.   Our billing staff will not be able to assist you with questions regarding bills from these companies.  You will be contacted with the lab results as soon as they are available. The fastest way to get your results is to activate your My Chart account. Instructions are located on the last page of this paperwork. If you have not heard from [place] regarding the results in 2 weeks, please contact this office.       I personally performed the services described in this documentation, which was scribed in my presence. The recorded information has been reviewed and considered, and addended by me as needed.   Signed,   [name], MD [facility].  10/03/15 12:39  PM

## 2015-10-03 NOTE — Patient Instructions (Addendum)
Your hip and back x-rays indicated arthritis. Ankle x-ray overall appeared okay. With the swelling, gout is a possibility, or other inflammation within the joint. I will prescribe prednisone one more time today, but further plan to be discussed with neurosurgery after your MRI. Tylenol is okay to take with prednisone, but no other over-the-counter medicine. I did check a gout test for your ankle swelling, but I would like you to follow-up within the next 2 weeks if that has not completely improved. Sooner if worse.  I am checking a 3 month blood sugar test as yours was borderline elevated today, and with prednisone use, this can make blood sugar worse.  Return to the clinic or go to the nearest emergency room if any of your symptoms worsen or new symptoms occur.   IF you received an x-ray today, you will receive an invoice from [facility]. Please contact [facility] at [phone] with questions or concerns regarding your invoice.   IF you received labwork today, you will receive an invoice from [employer]. Please contact [facility] at [phone] with questions or concerns regarding your invoice.   Our billing staff will not be able to assist you with questions regarding bills from these companies.  You will be contacted with the lab results as soon as they are available. The fastest way to get your results is to activate your My Chart account. Instructions are located on the last page of this paperwork. If you have not heard from [place] regarding the results in 2 weeks, please contact this office.

## 2015-10-04 LAB — HEMOGLOBIN A1C
HEMOGLOBIN A1C: 5.7 % — AB (ref <5.7)
MEAN PLASMA GLUCOSE: 117 mg/dL

## 2015-10-16 ENCOUNTER — Telehealth: Payer: Self-pay

## 2015-10-16 NOTE — Telephone Encounter (Signed)
Should call Dr. Marchelle Folks office to discuss plan. As of our last office visit on September 7, planned for MRI.

## 2015-10-16 NOTE — Telephone Encounter (Signed)
Patient's wife is calling because the patient is supposed to be getting x-rays at Dr. Irven [place] office but hasn't heard anything back from them. She wants to know if Dr. Carlota Raspberry would rather the patient go somewhere else. Please advise! [phone]

## 2015-10-17 NOTE — Telephone Encounter (Signed)
I'm not sure what I am able to do other than if they wanted to see different neurosurgeon, but if he is already under the care of Dr. Annette Stable, that may be the most efficient way to be treated. It appears they're trying to get the MRI authorized. Their staff may be only give him more information of any delays or anticipated time of the MRI.

## 2015-10-17 NOTE — Telephone Encounter (Signed)
Spoke with pt. Had already contacted Dr. Marchelle Folks office

## 2015-11-05 ENCOUNTER — Ambulatory Visit: Payer: Commercial Managed Care - HMO

## 2015-11-13 ENCOUNTER — Ambulatory Visit
Admission: RE | Admit: 2015-11-13 | Discharge: 2015-11-13 | Disposition: A | Discharge status: Home / Self Care | Payer: Commercial Managed Care - HMO | Source: Ambulatory Visit | Attending: Neurosurgery | Admitting: Neurosurgery

## 2015-11-13 DIAGNOSIS — M48061 Spinal stenosis, lumbar region without neurogenic claudication: Secondary | ICD-10-CM | POA: Diagnosis not present

## 2015-11-13 MED ORDER — GADOBENATE DIMEGLUMINE 529 MG/ML IV SOLN
15.0000 mL | Freq: Once | INTRAVENOUS | Status: AC | PRN
  Administered 2015-11-13: 15 mL via INTRAVENOUS

## 2015-11-21 ENCOUNTER — Encounter: Payer: Commercial Managed Care - HMO | Admitting: Family Medicine

## 2015-11-27 ENCOUNTER — Other Ambulatory Visit: Payer: Self-pay | Admitting: Neurosurgery

## 2015-11-27 DIAGNOSIS — M48062 Spinal stenosis, lumbar region with neurogenic claudication: Secondary | ICD-10-CM | POA: Diagnosis not present

## 2015-11-28 ENCOUNTER — Telehealth: Payer: Self-pay | Admitting: *Deleted

## 2015-11-28 ENCOUNTER — Encounter: Payer: Self-pay | Admitting: Family Medicine

## 2015-11-28 ENCOUNTER — Ambulatory Visit (INDEPENDENT_AMBULATORY_CARE_PROVIDER_SITE_OTHER): Payer: Commercial Managed Care - HMO | Admitting: Family Medicine

## 2015-11-28 VITALS — BP 110/70 | HR 81 | Temp 98.3°F | Resp 16 | Ht 69.25 in | Wt 163.0 lb

## 2015-11-28 DIAGNOSIS — Z0000 Encounter for general adult medical examination without abnormal findings: Secondary | ICD-10-CM

## 2015-11-28 DIAGNOSIS — G4700 Insomnia, unspecified: Secondary | ICD-10-CM | POA: Diagnosis not present

## 2015-11-28 DIAGNOSIS — G8929 Other chronic pain: Secondary | ICD-10-CM | POA: Diagnosis not present

## 2015-11-28 DIAGNOSIS — F419 Anxiety disorder, unspecified: Secondary | ICD-10-CM | POA: Diagnosis not present

## 2015-11-28 DIAGNOSIS — Z720 Tobacco use: Secondary | ICD-10-CM | POA: Diagnosis not present

## 2015-11-28 DIAGNOSIS — R972 Elevated prostate specific antigen [PSA]: Secondary | ICD-10-CM

## 2015-11-28 DIAGNOSIS — E785 Hyperlipidemia, unspecified: Secondary | ICD-10-CM

## 2015-11-28 DIAGNOSIS — M549 Dorsalgia, unspecified: Secondary | ICD-10-CM

## 2015-11-28 DIAGNOSIS — Z125 Encounter for screening for malignant neoplasm of prostate: Secondary | ICD-10-CM

## 2015-11-28 LAB — COMPLETE METABOLIC PANEL WITH GFR
ALBUMIN: 3.9 g/dL (ref 3.6–5.1)
ALT: 39 U/L (ref 9–46)
AST: 29 U/L (ref 10–35)
Alkaline Phosphatase: 115 U/L (ref 40–115)
BUN: 18 mg/dL (ref 7–25)
CALCIUM: 9.3 mg/dL (ref 8.6–10.3)
CHLORIDE: 102 mmol/L (ref 98–110)
CO2: 28 mmol/L (ref 20–31)
Creat: 1.14 mg/dL (ref 0.70–1.33)
GFR, Est African American: 81 mL/min (ref >=60)
GFR, Est Non African American: 70 mL/min (ref >=60)
GLUCOSE: 91 mg/dL (ref 65–99)
Potassium: 4.6 mmol/L (ref 3.5–5.3)
Sodium: 139 mmol/L (ref 135–146)
Total Bilirubin: 0.5 mg/dL (ref 0.2–1.2)
Total Protein: 7.1 g/dL (ref 6.1–8.1)

## 2015-11-28 LAB — LIPID PANEL
CHOLESTEROL: 192 mg/dL (ref 125–200)
HDL: 45 mg/dL (ref >=40)
LDL Cholesterol: 126 mg/dL (ref <130)
Total CHOL/HDL Ratio: 4.3 Ratio (ref <=5.0)
Triglycerides: 104 mg/dL (ref <150)
VLDL: 21 mg/dL (ref <30)

## 2015-11-28 LAB — PSA: PSA: 7.3 ng/mL — AB (ref <=4.0)

## 2015-11-28 MED ORDER — AMITRIPTYLINE HCL 50 MG PO TABS: 50.0000 mg | ORAL_TABLET | Freq: Every day | ORAL | 6 refills | Status: DC

## 2015-11-28 MED ORDER — LOVASTATIN 20 MG PO TABS: 20.0000 mg | ORAL_TABLET | Freq: Every day | ORAL | 3 refills | Status: DC

## 2015-11-28 NOTE — Telephone Encounter (Signed)
Faxed RX to [facility] for lovastatin 20 mg, per Dr Carlota Raspberry. Confirmation page received at 4:16 pm.

## 2015-11-28 NOTE — Patient Instructions (Addendum)
As we discussed, I have concerns with taking only xanax for treatment of anxiety and the possible risks and side effects of this medicine.  I understand you have been taking that medicine for years, but there may be safer daily medication options. You also appear to be out early based on prescription from Dr. Joseph Art, so I will need to call your pharmacy to see what happened Please follow up in next 1 month to discuss plan for this medication further.   You will be due for repeat blood sugar test in next 2 months.   As discussed in the past, I do recommend you quit smoking as that can worsen your risk for stroke, and wound healing in addition to other health problems. I listed information below to help, or you can follow-up and discuss other options for quitting smoking when you are ready.  Good luck with the upcoming surgery.   [facility] offers smoking cessation clinics. Registration is required. To register call [phone] or register online at [email or web address] Keeping you healthy  Get these tests  Blood pressure- Have your blood pressure checked once a year by your healthcare provider.  Normal blood pressure is 120/80  Weight- Have your body mass index (BMI) calculated to screen for obesity.  BMI is a measure of body fat based on height and weight. You can also calculate your own BMI at [email or web address].  Cholesterol- Have your cholesterol checked every year.  Diabetes- Have your blood sugar checked regularly if you have high blood pressure, high cholesterol, have a family history of diabetes or if you are overweight.  Screening for Colon Cancer- Colonoscopy starting at age 37.  Screening may begin sooner depending on your family history and other health conditions. Follow up colonoscopy as directed by your Gastroenterologist.  Screening for Prostate Cancer- Both blood work (PSA) and a rectal exam help screen for Prostate Cancer.  Screening begins at age 28 with  African-American men and at age 19 with Caucasian men.  Screening may begin sooner depending on your family history.  Take these medicines  Aspirin- One aspirin daily can help prevent Heart disease and Stroke.  Flu shot- Every fall.  Tetanus- Every 10 years.  Zostavax- Once after the age of 28 to prevent Shingles.  Pneumonia shot- Once after the age of 71; if you are younger than 74, ask your healthcare provider if you need a Pneumonia shot.  Take these steps  Don't smoke- If you do smoke, talk to your doctor about quitting.  For tips on how to quit, go to www.smokefree.gov or call 1-800-QUIT-NOW.  Be physically active- Exercise 5 days a week for at least 30 minutes.  If you are not already physically active start slow and gradually work up to 30 minutes of moderate physical activity.  Examples of moderate activity include walking briskly, mowing the yard, dancing, swimming, bicycling, etc.  Eat a healthy diet- Eat a variety of healthy food such as fruits, vegetables, low fat milk, low fat cheese, yogurt, lean meant, poultry, fish, beans, tofu, etc. For more information go to www.thenutritionsource.org  Drink alcohol in moderation- Limit alcohol intake to less than two drinks a day. Never drink and drive.  Dentist- Brush and floss twice daily; visit your dentist twice a year.  Depression- Your emotional health is as important as your physical health. If you're feeling down, or losing interest in things you would normally enjoy please talk to your healthcare provider.  Eye exam- Visit your eye doctor every year.  Safe sex- If you may be exposed to a sexually transmitted infection, use a condom.  Seat belts- Seat belts can save your life; always wear one.  Smoke/Carbon Monoxide detectors- These detectors need to be installed on the appropriate level of your home.  Replace batteries at least once a year.  Skin cancer- When out in the sun, cover up and use sunscreen 15 SPF or  higher.  Violence- If anyone is threatening you, please tell your healthcare provider.  Living Will/ Health care power of attorney- Speak with your healthcare provider and family.  Smoking Cessation, Tips for Success If you are ready to quit smoking, congratulations! You have chosen to help yourself be healthier. Cigarettes bring nicotine, tar, carbon monoxide, and other irritants into your body. Your lungs, heart, and blood vessels will be able to work better without these poisons. There are many different ways to quit smoking. Nicotine gum, nicotine patches, a nicotine inhaler, or nicotine nasal spray can help with physical craving. Hypnosis, support groups, and medicines help break the habit of smoking. WHAT THINGS CAN I DO TO MAKE QUITTING EASIER?  Here are some tips to help you quit for good:  Pick a date when you will quit smoking completely. Tell all of your friends and family about your plan to quit on that date.  Do not try to slowly cut down on the number of cigarettes you are smoking. Pick a quit date and quit smoking completely starting on that day.  Throw away all cigarettes.   Clean and remove all ashtrays from your home, work, and car.  On a card, write down your reasons for quitting. Carry the card with you and read it when you get the urge to smoke.  Cleanse your body of nicotine. Drink enough water and fluids to keep your urine clear or pale yellow. Do this after quitting to flush the nicotine from your body.  Learn to predict your moods. Do not let a bad situation be your excuse to have a cigarette. Some situations in your life might tempt you into wanting a cigarette.  Never have "just one" cigarette. It leads to wanting another and another. Remind yourself of your decision to quit.  Change habits associated with smoking. If you smoked while driving or when feeling stressed, try other activities to replace smoking. Stand up when drinking your coffee. Brush your teeth  after eating. Sit in a different chair when you read the paper. Avoid alcohol while trying to quit, and try to drink fewer caffeinated beverages. Alcohol and caffeine may urge you to smoke.  Avoid foods and drinks that can trigger a desire to smoke, such as sugary or spicy foods and alcohol.  Ask people who smoke not to smoke around you.  Have something planned to do right after eating or having a cup of coffee. For example, plan to take a walk or exercise.  Try a relaxation exercise to calm you down and decrease your stress. Remember, you may be tense and nervous for the first 2 weeks after you quit, but this will pass.  Find new activities to keep your hands busy. Play with a pen, coin, or rubber band. Doodle or draw things on paper.  Brush your teeth right after eating. This will help cut down on the craving for the taste of tobacco after meals. You can also try mouthwash.   Use oral substitutes in place of cigarettes. Try using lemon drops, carrots, cinnamon sticks, or chewing gum. Keep them handy  so they are available when you have the urge to smoke.  When you have the urge to smoke, try deep breathing.  Designate your home as a nonsmoking area.  If you are a heavy smoker, ask your health care provider about a prescription for nicotine chewing gum. It can ease your withdrawal from nicotine.  Reward yourself. Set aside the cigarette money you save and buy yourself something nice.  Look for support from others. Join a support group or smoking cessation program. Ask someone at home or at work to help you with your plan to quit smoking.  Always ask yourself, "Do I need this cigarette or is this just a reflex?" Tell yourself, "Today, I choose not to smoke," or "I do not want to smoke." You are reminding yourself of your decision to quit.  Do not replace cigarette smoking with electronic cigarettes (commonly called e-cigarettes). The safety of e-cigarettes is unknown, and some may contain  harmful chemicals.  If you relapse, do not give up! Plan ahead and think about what you will do the next time you get the urge to smoke. HOW WILL I FEEL WHEN I QUIT SMOKING? You may have symptoms of withdrawal because your body is used to nicotine (the addictive substance in cigarettes). You may crave cigarettes, be irritable, feel very hungry, cough often, get headaches, or have difficulty concentrating. The withdrawal symptoms are only temporary. They are strongest when you first quit but will go away within 10-14 days. When withdrawal symptoms occur, stay in control. Think about your reasons for quitting. Remind yourself that these are signs that your body is healing and getting used to being without cigarettes. Remember that withdrawal symptoms are easier to treat than the major diseases that smoking can cause.  Even after the withdrawal is over, expect periodic urges to smoke. However, these cravings are generally short lived and will go away whether you smoke or not. Do not smoke! WHAT RESOURCES ARE AVAILABLE TO HELP ME QUIT SMOKING? Your health care provider can direct you to community resources or hospitals for support, which may include:  Group support.  Education.  Hypnosis.  Therapy.   This information is not intended to replace advice given to you by your health care provider. Make sure you discuss any questions you have with your health care provider.   Document Released: 10/11/2003 Document Revised: 02/02/2014 Document Reviewed: 06/30/2012 Elsevier Interactive Patient Education 2016 [employer].   IF you received an x-ray today, you will receive an invoice from [facility]. Please contact [facility] at [phone] with questions or concerns regarding your invoice.   IF you received labwork today, you will receive an invoice from [employer]. Please contact [facility] at [phone] with questions or concerns regarding your invoice.    Our billing staff will not be able to assist you with questions regarding bills from these companies.  You will be contacted with the lab results as soon as they are available. The fastest way to get your results is to activate your My Chart account. Instructions are located on the last page of this paperwork. If you have not heard from [place] regarding the results in 2 weeks, please contact this office.

## 2015-11-28 NOTE — Progress Notes (Signed)
   Subjective:    Patient ID: Roy Moore, male    DOB: Jul 30, 1955, 60 y.o.   MRN: [id number]  HPI    Review of Systems  Constitutional: Negative.   HENT: Negative.   Eyes: Negative.   Respiratory: Negative.   Cardiovascular: Negative.   Gastrointestinal: Negative.   Endocrine: Negative.   Genitourinary: Negative.   Musculoskeletal: Positive for back pain.  Skin: Negative.   Allergic/Immunologic: Negative.   Neurological: Negative.   Hematological: Negative.   Psychiatric/Behavioral: Negative.        Objective:   Physical Exam        Assessment & Plan:

## 2015-11-28 NOTE — Progress Notes (Addendum)
By signing my name below, I, Roy Moore, attest that this documentation has been prepared under the direction and in the presence of Roy Ray, MD.  Electronically Signed: Verlee Moore, Medical Scribe. 11/28/15. 12:19 PM.  Subjective:    Patient ID: Roy Moore, male    DOB: [birth year], 60 y.o.   MRN: [id number]  HPI Chief Complaint  Patient presents with  . Annual Exam  . Medication Refill    Alprazolam 1mg , Amitriptyline 50 mg, Lovastatin 20 mg    HPI Comments: Roy Moore is a 60 y.o. male who presents to the [facility] for his complete annual physical. Pt is fasting. 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 He is on ASA.  Recurrent low back pain, and sciatica: Treated with multiple prednisone tapers in the past and followed by neuro surgery, planned for L4-5 microdiscetomy by Dr. Annette Stable on Nov 7th  HLD: Takes ASA 81mg , and lovastatin 20mg  QHS  Lab Results  Component Value Date   CHOL 183 05/08/2015   HDL 48 05/08/2015   LDLCALC 116 05/08/2015   TRIG 96 05/08/2015   CHOLHDL 3.8 05/08/2015   Lab Results  Component Value Date   ALT 28 05/08/2015   AST 19 05/08/2015   ALKPHOS 92 05/08/2015   BILITOT 0.6 05/08/2015   Anxiety: Request a refill for Xanax today. This was last Rx in 05/2015 for 1mg  TID for anxiety with #90 and 5 refills, of note he was also Rx elavil 50mg  QHS PRN. It appears he was also dx with adjustment disorder with mixed anxiety and depressed mood by Dr. Joseph Art 05/2015.  Has been taking medication for anxiety 20 years and last saw a psychiatrist in 2009. Takes Xanax TID regularly (last dose at 6pm-7pm), as well as elavil 50mg  QHS when he goes to bed at 1am-2am. Ran out of Xanax 2-3 days ago. Denies taking extra doses or sharing Xanax.   7:24 PM Addendum. Clarified with his pharmacy that he  Still had refill. Rx filled May 22 for Xanax,  then had prescriptions filled on June 22, July 21, September 8, October 5th. Had refill left that was filled today.   Cancer Screening: Has no known family hx since his dad left when he was 59 m/o. Prostate CA: Isn't sure if he was screened. Agrees to testing today. No results found for: PSA Colon CA: Colonoscopy Nov 2015 Dr. Fuller Plan. 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  PreDM: Was told his dad had DM Lab Results  Component Value Date   HGBA1C 5.7 (H) 10/03/2015   Immunizations: Had shingles 10-15 years ago. Deferred his flu shot. Immunization History  Administered Date(s) Administered  . Tdap 01/21/2014   Vision: Dec vision in the right eye.  Visual Acuity Screening   Right eye Left eye Both eyes  Without correction: 20/100 20/30 20/25  With correction:      Dentist:  Exercise: Limited with his back issues.  Depression Screening: Hx of anxiety and takes Xanax 1mg  PRN Depression screen [facility] 2/9 11/28/2015 10/03/2015 08/07/2015 06/18/2015 05/08/2015  Decreased Interest 0 0 0 0 0  Down, Depressed, Hopeless 0 0 0 0 0  PHQ - 2 Score 0 0 0 0 0   HIV/Hep C Screening: Had HIV and Hep C screening earlier this year; negative and non reactive.  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  Patient Active Problem List   Diagnosis Date Noted  . CVA, old, hemiparesis ([place]) 03/24/2011  . ANXIETY DISORDER 10/04/2007  . TOBACCO ABUSE 09/30/2007  . GERD 09/30/2007  . DYSLIPIDEMIA 09/15/2007  . STROKE WITH LEFT HEMIPARESIS 09/15/2007   Past Medical History:  Diagnosis Date  . Hyperlipidemia   . Kidney stone   . Stroke [facility])    Past Surgical History:  Procedure Laterality Date  . BACK SURGERY     10 yrs ago or so-fusion in neck   . COLONOSCOPY     15-20 yrs ago-normal per pt.   . teeth removal     No Known Allergies Prior to Admission medications   Medication Sig Start Date End Date Taking? Authorizing Provider  ALPRAZolam Roy Moore) 1 MG  tablet Take 1 tablet (1 mg total) by mouth 3 (three) times daily as needed. for anxiety 06/18/15   [name], MD  amitriptyline (ELAVIL) 50 MG tablet Take 1 tablet (50 mg total) by mouth at bedtime. 06/18/15   [name], MD  aspirin 81 MG chewable tablet Chew 81 mg by mouth daily.     Historical Provider, MD  B Complex Vitamins (VITAMIN B COMPLEX PO) Take 1 tablet by mouth daily.     Historical Provider, MD  Cholecalciferol (VITAMIN D PO) Take 1 tablet by mouth daily.     Historical Provider, MD  lovastatin (MEVACOR) 20 MG tablet Take 1 tablet (20 mg total) by mouth at bedtime. 05/08/15   [name], MD  meloxicam (MOBIC) 7.5 MG tablet Take 1-2 tablets (7.5-15 mg total) by mouth daily. 08/07/15   [name], MD  predniSONE (DELTASONE) 20 MG tablet 3 by mouth for 3 days, then 2 by mouth for 2 days, then 1 by mouth for 2 days, then 1/2 by mouth for 2 days. 10/03/15   [name], MD  Thiamine HCl (VITAMIN B-1 PO) Take 1 tablet by mouth daily.     Historical Provider, MD   Social History   Social History  . Marital status: Married    Spouse name: [name]  . Number of children: [name]  . Years of education: [name]   Occupational History  . Not on file.   Social History Main Topics  . Smoking status: Current Every Day Smoker    Packs/day: 1.00    Types: Cigarettes  . Smokeless tobacco: Never Used  . Alcohol use No  . Drug use: No  . Sexual activity: Not on file   Other Topics Concern  . Not on file   Social History Narrative  . No narrative on file   Review of Systems  13 point ROS was negative. Objective:  Physical Exam  Constitutional: He is oriented to person, place, and time. He appears well-developed and well-nourished.  HENT:  Head: Normocephalic and atraumatic.  Right Ear: External ear normal.  Left Ear: External ear normal.  Mouth/Throat: Oropharynx is clear and moist.  Eyes: Conjunctivae and EOM are normal. Pupils are equal, round, and reactive to light.    Neck: Normal range of motion. Neck supple. No thyromegaly present.  Cardiovascular: Normal rate, regular rhythm, normal heart sounds and intact distal pulses.   Pulmonary/Chest: Effort normal and breath sounds normal. No respiratory distress. He has no wheezes.  Abdominal: Soft. He exhibits no distension. There is no tenderness.  Genitourinary:  Genitourinary Comments: Declined DRE.   Musculoskeletal: Normal range of motion. He exhibits no edema or tenderness.  Lymphadenopathy:  He has no cervical adenopathy.  Neurological: He is alert and oriented to person, place, and time. He has normal reflexes.  Skin: Skin is warm and dry.  Psychiatric: He has a normal mood and affect. His behavior is normal.  Vitals reviewed.  BP 110/70   Pulse 81   Temp 98.3 F (36.8 C) (Oral)   Resp 16   Ht 5' 9.25" (1.759 m)   Wt 163 lb (73.9 kg)   SpO2 98%   BMI 23.90 kg/m    Assessment & Plan:   DAIVON PERFETTI is a 60 y.o. male Annual physical exam  --anticipatory guidance as below in AVS, screening labs. Health maintenance items as above in HPI discussed/recommended as applicable.   Chronic back pain, unspecified back location, unspecified back pain laterality  -Planned surgery with neurosurgery soon.  Chronic anxiety  -Discussed concerns of use of just Xanax 3 times a day, side effects and dependence risks discussed. Encouraged to consider SSRI or other anxiety treatment, but he was resistant to any changes in medications. Verified that he did have refill left at his pharmacy. He filled that on day of visit, follow-up in the next month to discuss that medication and plan for anxiety further.  Also cautioned him on use of Xanax with any narcotic pain medicines that may be prescribed after his upcoming surgery due to risk of accidental overdose.  Hyperlipidemia, unspecified hyperlipidemia type - Plan: COMPLETE METABOLIC PANEL WITH GFR, Lipid panel, lovastatin (MEVACOR) 20 MG tablet  -Continue  Mevacor as tolerating, labs pending.  Tobacco abuse  -Stressed importance of tobacco cessation, and risks to his health were discussed.Marland Kitchen Resources provided.  Screening for prostate cancer - Plan: PSA  -We discussed pros and cons of prostate cancer screening, and after this discussion, he chose to have screening done with PSA only. Limitations in just this screening were discussed..   Insomnia, unspecified type - Plan: amitriptyline (ELAVIL) 50 MG tablet  -Agreed to provide Elavil again for insomnia, but as above, will need to discuss anxiety and treatment further at future visit. Additive side effects of sedation were discussed with Xanax and Elavil.  Meds ordered this encounter  Medications  . amitriptyline (ELAVIL) 50 MG tablet    Sig: Take 1 tablet (50 mg total) by mouth at bedtime.    Dispense:  60 tablet    Refill:  6  . lovastatin (MEVACOR) 20 MG tablet    Sig: Take 1 tablet (20 mg total) by mouth at bedtime.    Dispense:  90 tablet    Refill:  3   Patient Instructions   As we discussed, I have concerns with taking only xanax for treatment of anxiety and the possible risks and side effects of this medicine.  I understand you have been taking that medicine for years, but there may be safer daily medication options. You also appear to be out early based on prescription from Dr. Joseph Art, so I will need to call your pharmacy to see what happened Please follow up in next 1 month to discuss plan for this medication further.   You will be due for repeat blood sugar test in next 2 months.   As discussed in the past, I do recommend you quit smoking as that can worsen your risk for stroke, and wound healing in addition to other health problems. I listed information below to help, or you can follow-up and discuss other options for quitting smoking when you are ready.  Good luck with the  upcoming surgery.   [place] offers smoking cessation clinics. Registration is required. To  register call [phone] or register online at [email or web address] Keeping you healthy  Get these tests  Blood pressure- Have your blood pressure checked once a year by your healthcare provider.  Normal blood pressure is 120/80  Weight- Have your body mass index (BMI) calculated to screen for obesity.  BMI is a measure of body fat based on height and weight. You can also calculate your own BMI at ViewBank[employer].si.  Cholesterol- Have your cholesterol checked every year.  Diabetes- Have your blood sugar checked regularly if you have high blood pressure, high cholesterol, have a family history of diabetes or if you are overweight.  Screening for Colon Cancer- Colonoscopy starting at age 8.  Screening may begin sooner depending on your family history and other health conditions. Follow up colonoscopy as directed by your Gastroenterologist.  Screening for Prostate Cancer- Both blood work (PSA) and a rectal exam help screen for Prostate Cancer.  Screening begins at age 21 with African-American men and at age 79 with Caucasian men.  Screening may begin sooner depending on your family history.  Take these medicines  Aspirin- One aspirin daily can help prevent Heart disease and Stroke.  Flu shot- Every fall.  Tetanus- Every 10 years.  Zostavax- Once after the age of 2 to prevent Shingles.  Pneumonia shot- Once after the age of 21; if you are younger than 63, ask your healthcare provider if you need a Pneumonia shot.  Take these steps  Don't smoke- If you do smoke, talk to your doctor about quitting.  For tips on how to quit, go to www.smokefree.gov or call 1-800-QUIT-NOW.  Be physically active- Exercise 5 days a week for at least 30 minutes.  If you are not already physically active start slow and gradually work up to 30 minutes of moderate physical activity.  Examples of moderate activity include walking briskly, mowing the yard, dancing, swimming, bicycling, etc.  Eat a healthy  diet- Eat a variety of healthy food such as fruits, vegetables, low fat milk, low fat cheese, yogurt, lean meant, poultry, fish, beans, tofu, etc. For more information go to www.thenutritionsource.org  Drink alcohol in moderation- Limit alcohol intake to less than two drinks a day. Never drink and drive.  Dentist- Brush and floss twice daily; visit your dentist twice a year.  Depression- Your emotional health is as important as your physical health. If you're feeling down, or losing interest in things you would normally enjoy please talk to your healthcare provider.  Eye exam- Visit your eye doctor every year.  Safe sex- If you may be exposed to a sexually transmitted infection, use a condom.  Seat belts- Seat belts can save your life; always wear one.  Smoke/Carbon Monoxide detectors- These detectors need to be installed on the appropriate level of your home.  Replace batteries at least once a year.  Skin cancer- When out in the sun, cover up and use sunscreen 15 SPF or higher.  Violence- If anyone is threatening you, please tell your healthcare provider.  Living Will/ Health care power of attorney- Speak with your healthcare provider and family.  Smoking Cessation, Tips for Success If you are ready to quit smoking, congratulations! You have chosen to help yourself be healthier. Cigarettes bring nicotine, tar, carbon monoxide, and other irritants into your body. Your lungs, heart, and blood vessels will be able to work better without these poisons. There are many different ways to quit  smoking. Nicotine gum, nicotine patches, a nicotine inhaler, or nicotine nasal spray can help with physical craving. Hypnosis, support groups, and medicines help break the habit of smoking. WHAT THINGS CAN I DO TO MAKE QUITTING EASIER?  Here are some tips to help you quit for good:  Pick a date when you will quit smoking completely. Tell all of your friends and family about your plan to quit on that  date.  Do not try to slowly cut down on the number of cigarettes you are smoking. Pick a quit date and quit smoking completely starting on that day.  Throw away all cigarettes.   Clean and remove all ashtrays from your home, work, and car.  On a card, write down your reasons for quitting. Carry the card with you and read it when you get the urge to smoke.  Cleanse your body of nicotine. Drink enough water and fluids to keep your urine clear or pale yellow. Do this after quitting to flush the nicotine from your body.  Learn to predict your moods. Do not let a bad situation be your excuse to have a cigarette. Some situations in your life might tempt you into wanting a cigarette.  Never have "just one" cigarette. It leads to wanting another and another. Remind yourself of your decision to quit.  Change habits associated with smoking. If you smoked while driving or when feeling stressed, try other activities to replace smoking. Stand up when drinking your coffee. Brush your teeth after eating. Sit in a different chair when you read the paper. Avoid alcohol while trying to quit, and try to drink fewer caffeinated beverages. Alcohol and caffeine may urge you to smoke.  Avoid foods and drinks that can trigger a desire to smoke, such as sugary or spicy foods and alcohol.  Ask people who smoke not to smoke around you.  Have something planned to do right after eating or having a cup of coffee. For example, plan to take a walk or exercise.  Try a relaxation exercise to calm you down and decrease your stress. Remember, you may be tense and nervous for the first 2 weeks after you quit, but this will pass.  Find new activities to keep your hands busy. Play with a pen, coin, or rubber band. Doodle or draw things on paper.  Brush your teeth right after eating. This will help cut down on the craving for the taste of tobacco after meals. You can also try mouthwash.   Use oral substitutes in place of  cigarettes. Try using lemon drops, carrots, cinnamon sticks, or chewing gum. Keep them handy so they are available when you have the urge to smoke.  When you have the urge to smoke, try deep breathing.  Designate your home as a nonsmoking area.  If you are a heavy smoker, ask your health care provider about a prescription for nicotine chewing gum. It can ease your withdrawal from nicotine.  Reward yourself. Set aside the cigarette money you save and buy yourself something nice.  Look for support from others. Join a support group or smoking cessation program. Ask someone at home or at work to help you with your plan to quit smoking.  Always ask yourself, "Do I need this cigarette or is this just a reflex?" Tell yourself, "Today, I choose not to smoke," or "I do not want to smoke." You are reminding yourself of your decision to quit.  Do not replace cigarette smoking with electronic cigarettes (commonly called e-cigarettes).  The safety of e-cigarettes is unknown, and some may contain harmful chemicals.  If you relapse, do not give up! Plan ahead and think about what you will do the next time you get the urge to smoke. HOW WILL I FEEL WHEN I QUIT SMOKING? You may have symptoms of withdrawal because your body is used to nicotine (the addictive substance in cigarettes). You may crave cigarettes, be irritable, feel very hungry, cough often, get headaches, or have difficulty concentrating. The withdrawal symptoms are only temporary. They are strongest when you first quit but will go away within 10-14 days. When withdrawal symptoms occur, stay in control. Think about your reasons for quitting. Remind yourself that these are signs that your body is healing and getting used to being without cigarettes. Remember that withdrawal symptoms are easier to treat than the major diseases that smoking can cause.  Even after the withdrawal is over, expect periodic urges to smoke. However, these cravings are generally  short lived and will go away whether you smoke or not. Do not smoke! WHAT RESOURCES ARE AVAILABLE TO HELP ME QUIT SMOKING? Your health care provider can direct you to community resources or hospitals for support, which may include:  Group support.  Education.  Hypnosis.  Therapy.   This information is not intended to replace advice given to you by your health care provider. Make sure you discuss any questions you have with your health care provider.   Document Released: 10/11/2003 Document Revised: 02/02/2014 Document Reviewed: 06/30/2012 Elsevier Interactive Patient Education 2016 [employer].   IF you received an x-Moore today, you will receive an invoice from [facility]. Please contact [facility] at [phone] with questions or concerns regarding your invoice.   IF you received labwork today, you will receive an invoice from [employer]. Please contact [facility] at [phone] with questions or concerns regarding your invoice.   Our billing staff will not be able to assist you with questions regarding bills from these companies.  You will be contacted with the lab results as soon as they are available. The fastest way to get your results is to activate your My Chart account. Instructions are located on the last page of this paperwork. If you have not heard from [place] regarding the results in 2 weeks, please contact this office.         I personally performed the services described in this documentation, which was scribed in my presence. The recorded information has been reviewed and considered, and addended by me as needed.   Signed,   Roy Ray, MD [facility].  11/28/15 10:32 PM

## 2015-12-02 ENCOUNTER — Encounter (HOSPITAL_COMMUNITY): Payer: Self-pay | Admitting: *Deleted

## 2015-12-03 ENCOUNTER — Encounter (HOSPITAL_COMMUNITY): Payer: Self-pay | Admitting: *Deleted

## 2015-12-03 ENCOUNTER — Ambulatory Visit (HOSPITAL_COMMUNITY): Payer: Commercial Managed Care - HMO | Admitting: Anesthesiology

## 2015-12-03 ENCOUNTER — Ambulatory Visit (HOSPITAL_COMMUNITY)
Admission: RE | Admit: 2015-12-03 | Discharge: 2015-12-03 | Disposition: A | Discharge status: Home / Self Care | Payer: Commercial Managed Care - HMO | Source: Ambulatory Visit | Attending: Neurosurgery | Admitting: Neurosurgery

## 2015-12-03 ENCOUNTER — Ambulatory Visit (HOSPITAL_COMMUNITY): Payer: Commercial Managed Care - HMO

## 2015-12-03 ENCOUNTER — Encounter (HOSPITAL_COMMUNITY)
Admission: RE | Disposition: A | Discharge status: Home / Self Care | Payer: Self-pay | Source: Ambulatory Visit | Attending: Neurosurgery

## 2015-12-03 DIAGNOSIS — K219 Gastro-esophageal reflux disease without esophagitis: Secondary | ICD-10-CM | POA: Insufficient documentation

## 2015-12-03 DIAGNOSIS — Z8673 Personal history of transient ischemic attack (TIA), and cerebral infarction without residual deficits: Secondary | ICD-10-CM | POA: Insufficient documentation

## 2015-12-03 DIAGNOSIS — E785 Hyperlipidemia, unspecified: Secondary | ICD-10-CM | POA: Diagnosis not present

## 2015-12-03 DIAGNOSIS — Z419 Encounter for procedure for purposes other than remedying health state, unspecified: Secondary | ICD-10-CM

## 2015-12-03 DIAGNOSIS — F17210 Nicotine dependence, cigarettes, uncomplicated: Secondary | ICD-10-CM | POA: Diagnosis not present

## 2015-12-03 DIAGNOSIS — M5126 Other intervertebral disc displacement, lumbar region: Secondary | ICD-10-CM | POA: Insufficient documentation

## 2015-12-03 DIAGNOSIS — Z9889 Other specified postprocedural states: Secondary | ICD-10-CM | POA: Diagnosis not present

## 2015-12-03 DIAGNOSIS — Z7982 Long term (current) use of aspirin: Secondary | ICD-10-CM | POA: Diagnosis not present

## 2015-12-03 DIAGNOSIS — M47816 Spondylosis without myelopathy or radiculopathy, lumbar region: Secondary | ICD-10-CM | POA: Insufficient documentation

## 2015-12-03 DIAGNOSIS — Z981 Arthrodesis status: Secondary | ICD-10-CM | POA: Diagnosis not present

## 2015-12-03 DIAGNOSIS — F419 Anxiety disorder, unspecified: Secondary | ICD-10-CM | POA: Diagnosis not present

## 2015-12-03 DIAGNOSIS — M48062 Spinal stenosis, lumbar region with neurogenic claudication: Secondary | ICD-10-CM | POA: Diagnosis not present

## 2015-12-03 DIAGNOSIS — M48061 Spinal stenosis, lumbar region without neurogenic claudication: Secondary | ICD-10-CM | POA: Diagnosis not present

## 2015-12-03 HISTORY — DX: Anxiety disorder, unspecified: F41.9

## 2015-12-03 HISTORY — PX: LUMBAR LAMINECTOMY/DECOMPRESSION MICRODISCECTOMY: SHX5026

## 2015-12-03 LAB — CBC WITH DIFFERENTIAL/PLATELET
BASOS ABS: 0.1 10*3/uL (ref 0.0–0.1)
Basophils Relative: 0 %
Eosinophils Absolute: 0.3 10*3/uL (ref 0.0–0.7)
Eosinophils Relative: 2 %
HEMATOCRIT: 38.8 % — AB (ref 39.0–52.0)
Hemoglobin: 13 g/dL (ref 13.0–17.0)
LYMPHS PCT: 14 %
Lymphs Abs: 1.8 10*3/uL (ref 0.7–4.0)
MCH: 28.6 pg (ref 26.0–34.0)
MCHC: 33.5 g/dL (ref 30.0–36.0)
MCV: 85.3 fL (ref 78.0–100.0)
MONO ABS: 0.9 10*3/uL (ref 0.1–1.0)
MONOS PCT: 7 %
NEUTROS ABS: 10.1 10*3/uL — AB (ref 1.7–7.7)
Neutrophils Relative %: 77 %
Platelets: 281 10*3/uL (ref 150–400)
RBC: 4.55 MIL/uL (ref 4.22–5.81)
RDW: 14.2 % (ref 11.5–15.5)
WBC: 13.1 10*3/uL — ABNORMAL HIGH (ref 4.0–10.5)

## 2015-12-03 LAB — BASIC METABOLIC PANEL
ANION GAP: 11 (ref 5–15)
BUN: 12 mg/dL (ref 6–20)
CHLORIDE: 101 mmol/L (ref 101–111)
CO2: 23 mmol/L (ref 22–32)
Calcium: 9.5 mg/dL (ref 8.9–10.3)
Creatinine, Ser: 1.09 mg/dL (ref 0.61–1.24)
GFR calc Af Amer: >60 mL/min (ref >60)
GLUCOSE: 95 mg/dL (ref 65–99)
POTASSIUM: 3.8 mmol/L (ref 3.5–5.1)
Sodium: 135 mmol/L (ref 135–145)

## 2015-12-03 LAB — SURGICAL PCR SCREEN
MRSA, PCR: NEGATIVE
Staphylococcus aureus: NEGATIVE

## 2015-12-03 SURGERY — LUMBAR LAMINECTOMY/DECOMPRESSION MICRODISCECTOMY 1 LEVEL
Anesthesia: General | Site: Back | Laterality: Bilateral

## 2015-12-03 MED ORDER — HEMOSTATIC AGENTS (NO CHARGE) OPTIME
TOPICAL | Status: DC | PRN
  Administered 2015-12-03: 1 via TOPICAL

## 2015-12-03 MED ORDER — FENTANYL CITRATE (PF) 100 MCG/2ML IJ SOLN
INTRAMUSCULAR | Status: AC
  Filled 2015-12-03: qty 2

## 2015-12-03 MED ORDER — MIDAZOLAM HCL 2 MG/2ML IJ SOLN
INTRAMUSCULAR | Status: AC
  Filled 2015-12-03: qty 2

## 2015-12-03 MED ORDER — LIDOCAINE HCL (CARDIAC) 20 MG/ML IV SOLN
INTRAVENOUS | Status: DC | PRN
  Administered 2015-12-03: 100 mg via INTRAVENOUS

## 2015-12-03 MED ORDER — ONDANSETRON HCL 4 MG/2ML IJ SOLN
INTRAMUSCULAR | Status: AC
  Filled 2015-12-03: qty 2

## 2015-12-03 MED ORDER — ONDANSETRON HCL 4 MG/2ML IJ SOLN
INTRAMUSCULAR | Status: DC | PRN
  Administered 2015-12-03: 4 mg via INTRAVENOUS

## 2015-12-03 MED ORDER — SODIUM CHLORIDE 0.9% FLUSH: 3.0000 mL | INTRAVENOUS | Status: DC | PRN

## 2015-12-03 MED ORDER — CEFAZOLIN SODIUM-DEXTROSE 2-4 GM/100ML-% IV SOLN
2.0000 g | INTRAVENOUS | Status: AC
  Administered 2015-12-03: 2 g via INTRAVENOUS
  Filled 2015-12-03: qty 100

## 2015-12-03 MED ORDER — HYDROMORPHONE HCL 1 MG/ML IJ SOLN: 0.5000 mg | INTRAMUSCULAR | Status: DC | PRN

## 2015-12-03 MED ORDER — PROPOFOL 10 MG/ML IV BOLUS
INTRAVENOUS | Status: DC | PRN
  Administered 2015-12-03: 150 mg via INTRAVENOUS

## 2015-12-03 MED ORDER — KETOROLAC TROMETHAMINE 30 MG/ML IJ SOLN: 30.0000 mg | Freq: Four times a day (QID) | INTRAMUSCULAR | Status: DC

## 2015-12-03 MED ORDER — ACETAMINOPHEN 650 MG RE SUPP: 650.0000 mg | RECTAL | Status: DC | PRN

## 2015-12-03 MED ORDER — HYDROCODONE-ACETAMINOPHEN 5-325 MG PO TABS: 1.0000 | ORAL_TABLET | ORAL | 0 refills | Status: DC | PRN

## 2015-12-03 MED ORDER — DEXAMETHASONE SODIUM PHOSPHATE 10 MG/ML IJ SOLN
10.0000 mg | INTRAMUSCULAR | Status: AC
  Administered 2015-12-03: 10 mg via INTRAVENOUS
  Filled 2015-12-03: qty 1

## 2015-12-03 MED ORDER — ACETAMINOPHEN 325 MG PO TABS: 650.0000 mg | ORAL_TABLET | ORAL | Status: DC | PRN

## 2015-12-03 MED ORDER — PHENYLEPHRINE HCL 10 MG/ML IJ SOLN
INTRAMUSCULAR | Status: DC | PRN
  Administered 2015-12-03: 50 ug/min via INTRAVENOUS

## 2015-12-03 MED ORDER — LACTATED RINGERS IV SOLN
INTRAVENOUS | Status: DC
  Administered 2015-12-03: 10:00:00 via INTRAVENOUS

## 2015-12-03 MED ORDER — KETOROLAC TROMETHAMINE 30 MG/ML IJ SOLN
30.0000 mg | Freq: Once | INTRAMUSCULAR | Status: AC
  Administered 2015-12-03: 30 mg via INTRAVENOUS

## 2015-12-03 MED ORDER — PHENOL 1.4 % MT LIQD
1.0000 | OROMUCOSAL | Status: DC | PRN
Start: 2015-12-03 — End: 2015-12-03

## 2015-12-03 MED ORDER — LIDOCAINE 2% (20 MG/ML) 5 ML SYRINGE
INTRAMUSCULAR | Status: AC
  Filled 2015-12-03: qty 5

## 2015-12-03 MED ORDER — BUPIVACAINE HCL (PF) 0.25 % IJ SOLN
INTRAMUSCULAR | Status: AC
  Filled 2015-12-03: qty 30

## 2015-12-03 MED ORDER — SUGAMMADEX SODIUM 200 MG/2ML IV SOLN
INTRAVENOUS | Status: DC | PRN
Start: 2015-12-03 — End: 2015-12-03
  Administered 2015-12-03: 200 mg via INTRAVENOUS

## 2015-12-03 MED ORDER — CYCLOBENZAPRINE HCL 10 MG PO TABS: 10.0000 mg | ORAL_TABLET | Freq: Three times a day (TID) | ORAL | Status: DC | PRN

## 2015-12-03 MED ORDER — BUPIVACAINE HCL (PF) 0.25 % IJ SOLN
INTRAMUSCULAR | Status: DC | PRN
  Administered 2015-12-03: 20 mL

## 2015-12-03 MED ORDER — KETOROLAC TROMETHAMINE 30 MG/ML IJ SOLN
INTRAMUSCULAR | Status: AC
  Filled 2015-12-03: qty 1

## 2015-12-03 MED ORDER — DEXAMETHASONE SODIUM PHOSPHATE 10 MG/ML IJ SOLN
INTRAMUSCULAR | Status: AC
Start: 2015-12-03 — End: 2015-12-03
  Filled 2015-12-03: qty 1

## 2015-12-03 MED ORDER — CEFAZOLIN IN D5W 1 GM/50ML IV SOLN: 1.0000 g | Freq: Three times a day (TID) | INTRAVENOUS | Status: DC

## 2015-12-03 MED ORDER — CHLORHEXIDINE GLUCONATE CLOTH 2 % EX PADS: 6.0000 | MEDICATED_PAD | Freq: Once | CUTANEOUS | Status: DC

## 2015-12-03 MED ORDER — SODIUM CHLORIDE 0.9% FLUSH: 3.0000 mL | Freq: Two times a day (BID) | INTRAVENOUS | Status: DC

## 2015-12-03 MED ORDER — MENTHOL 3 MG MT LOZG: 1.0000 | LOZENGE | OROMUCOSAL | Status: DC | PRN

## 2015-12-03 MED ORDER — ALPRAZOLAM 0.5 MG PO TABS: 1.0000 mg | ORAL_TABLET | Freq: Three times a day (TID) | ORAL | Status: DC | PRN

## 2015-12-03 MED ORDER — SUGAMMADEX SODIUM 200 MG/2ML IV SOLN
INTRAVENOUS | Status: AC
  Filled 2015-12-03: qty 2

## 2015-12-03 MED ORDER — FENTANYL CITRATE (PF) 100 MCG/2ML IJ SOLN
INTRAMUSCULAR | Status: DC | PRN
  Administered 2015-12-03: 50 ug via INTRAVENOUS
  Administered 2015-12-03: 100 ug via INTRAVENOUS
  Administered 2015-12-03 (×2): 50 ug via INTRAVENOUS

## 2015-12-03 MED ORDER — PROPOFOL 10 MG/ML IV BOLUS
INTRAVENOUS | Status: AC
  Filled 2015-12-03: qty 20

## 2015-12-03 MED ORDER — BACITRACIN 50000 UNITS IM SOLR
INTRAMUSCULAR | Status: DC | PRN
  Administered 2015-12-03: 11:00:00

## 2015-12-03 MED ORDER — SODIUM CHLORIDE 0.9 % IV SOLN: 250.0000 mL | INTRAVENOUS | Status: DC

## 2015-12-03 MED ORDER — AMITRIPTYLINE HCL 50 MG PO TABS
50.0000 mg | ORAL_TABLET | Freq: Every day | ORAL | Status: DC
  Filled 2015-12-03: qty 1

## 2015-12-03 MED ORDER — OXYCODONE-ACETAMINOPHEN 5-325 MG PO TABS: 1.0000 | ORAL_TABLET | ORAL | Status: DC | PRN

## 2015-12-03 MED ORDER — MUPIROCIN 2 % EX OINT
1.0000 "application " | TOPICAL_OINTMENT | Freq: Once | CUTANEOUS | Status: AC
  Administered 2015-12-03: 1 via TOPICAL

## 2015-12-03 MED ORDER — ROCURONIUM BROMIDE 10 MG/ML (PF) SYRINGE
PREFILLED_SYRINGE | INTRAVENOUS | Status: AC
  Filled 2015-12-03: qty 10

## 2015-12-03 MED ORDER — 0.9 % SODIUM CHLORIDE (POUR BTL) OPTIME
TOPICAL | Status: DC | PRN
  Administered 2015-12-03: 1000 mL

## 2015-12-03 MED ORDER — ROCURONIUM BROMIDE 100 MG/10ML IV SOLN
INTRAVENOUS | Status: DC | PRN
  Administered 2015-12-03: 70 mg via INTRAVENOUS

## 2015-12-03 MED ORDER — CYCLOBENZAPRINE HCL 10 MG PO TABS: 10.0000 mg | ORAL_TABLET | Freq: Three times a day (TID) | ORAL | 0 refills | Status: DC | PRN

## 2015-12-03 MED ORDER — MUPIROCIN 2 % EX OINT
TOPICAL_OINTMENT | CUTANEOUS | Status: AC
  Filled 2015-12-03: qty 22

## 2015-12-03 MED ORDER — PROMETHAZINE HCL 25 MG/ML IJ SOLN: 6.2500 mg | INTRAMUSCULAR | Status: DC | PRN

## 2015-12-03 MED ORDER — HYDROCODONE-ACETAMINOPHEN 5-325 MG PO TABS: 1.0000 | ORAL_TABLET | ORAL | Status: DC | PRN

## 2015-12-03 MED ORDER — THROMBIN 5000 UNITS EX SOLR
CUTANEOUS | Status: AC
  Filled 2015-12-03: qty 10000

## 2015-12-03 MED ORDER — PRAVASTATIN SODIUM 20 MG PO TABS
20.0000 mg | ORAL_TABLET | Freq: Every day | ORAL | Status: DC
  Filled 2015-12-03: qty 1

## 2015-12-03 MED ORDER — ONDANSETRON HCL 4 MG/2ML IJ SOLN: 4.0000 mg | INTRAMUSCULAR | Status: DC | PRN

## 2015-12-03 MED ORDER — HYDROMORPHONE HCL 1 MG/ML IJ SOLN: 0.2500 mg | INTRAMUSCULAR | Status: DC | PRN

## 2015-12-03 MED ORDER — MIDAZOLAM HCL 5 MG/5ML IJ SOLN
INTRAMUSCULAR | Status: DC | PRN
  Administered 2015-12-03: 2 mg via INTRAVENOUS

## 2015-12-03 MED ORDER — THROMBIN 5000 UNITS EX SOLR
CUTANEOUS | Status: DC | PRN
  Administered 2015-12-03 (×2): 5000 [IU] via TOPICAL

## 2015-12-03 SURGICAL SUPPLY — 55 items
ADH SKN CLS APL DERMABOND .7 (GAUZE/BANDAGES/DRESSINGS) ×1
APL SKNCLS STERI-STRIP NONHPOA (GAUZE/BANDAGES/DRESSINGS) ×1
BAG DECANTER FOR FLEXI CONT (MISCELLANEOUS) ×2 IMPLANT
BENZOIN TINCTURE PRP APPL 2/3 (GAUZE/BANDAGES/DRESSINGS) ×2 IMPLANT
BLADE CLIPPER SURG (BLADE) ×1 IMPLANT
BUR CUTTER 7.0 ROUND (BURR) ×2 IMPLANT
CANISTER SUCT 3000ML PPV (MISCELLANEOUS) ×2 IMPLANT
DECANTER SPIKE VIAL GLASS SM (MISCELLANEOUS) ×1 IMPLANT
DERMABOND ADVANCED (GAUZE/BANDAGES/DRESSINGS) ×1
DERMABOND ADVANCED .7 DNX12 (GAUZE/BANDAGES/DRESSINGS) ×1 IMPLANT
DRAPE HALF SHEET 40X57 (DRAPES) IMPLANT
DRAPE LAPAROTOMY 100X72X124 (DRAPES) ×2 IMPLANT
DRAPE MICROSCOPE LEICA (MISCELLANEOUS) ×2 IMPLANT
DRAPE POUCH INSTRU U-SHP 10X18 (DRAPES) ×2 IMPLANT
DRAPE SURG 17X23 STRL (DRAPES) ×4 IMPLANT
DRSG OPSITE POSTOP 4X6 (GAUZE/BANDAGES/DRESSINGS) ×1 IMPLANT
DURAPREP 26ML APPLICATOR (WOUND CARE) ×2 IMPLANT
ELECT REM PT RETURN 9FT ADLT (ELECTROSURGICAL) ×2
ELECTRODE REM PT RTRN 9FT ADLT (ELECTROSURGICAL) ×1 IMPLANT
GAUZE SPONGE 4X4 12PLY STRL (GAUZE/BANDAGES/DRESSINGS) ×1 IMPLANT
GAUZE SPONGE 4X4 16PLY XRAY LF (GAUZE/BANDAGES/DRESSINGS) IMPLANT
GLOVE BIOGEL PI IND STRL 6.5 (GLOVE) IMPLANT
GLOVE BIOGEL PI IND STRL 7.0 (GLOVE) IMPLANT
GLOVE BIOGEL PI IND STRL 7.5 (GLOVE) IMPLANT
GLOVE BIOGEL PI INDICATOR 6.5 (GLOVE) ×2
GLOVE BIOGEL PI INDICATOR 7.0 (GLOVE) ×2
GLOVE BIOGEL PI INDICATOR 7.5 (GLOVE) ×5
GLOVE ECLIPSE 7.0 STRL STRAW (GLOVE) ×4 IMPLANT
GLOVE ECLIPSE 9.0 STRL (GLOVE) ×2 IMPLANT
GLOVE EXAM NITRILE LRG STRL (GLOVE) IMPLANT
GLOVE EXAM NITRILE XL STR (GLOVE) IMPLANT
GLOVE EXAM NITRILE XS STR PU (GLOVE) IMPLANT
GLOVE SURG SS PI 6.5 STRL IVOR (GLOVE) ×3 IMPLANT
GLOVE SURG SS PI 7.0 STRL IVOR (GLOVE) ×2 IMPLANT
GOWN STRL REUS W/ TWL LRG LVL3 (GOWN DISPOSABLE) IMPLANT
GOWN STRL REUS W/ TWL XL LVL3 (GOWN DISPOSABLE) ×1 IMPLANT
GOWN STRL REUS W/TWL 2XL LVL3 (GOWN DISPOSABLE) IMPLANT
GOWN STRL REUS W/TWL LRG LVL3 (GOWN DISPOSABLE) ×10
GOWN STRL REUS W/TWL XL LVL3 (GOWN DISPOSABLE) ×4
KIT BASIN OR (CUSTOM PROCEDURE TRAY) ×2 IMPLANT
KIT ROOM TURNOVER OR (KITS) ×2 IMPLANT
NDL SPNL 22GX3.5 QUINCKE BK (NEEDLE) ×1 IMPLANT
NEEDLE HYPO 22GX1.5 SAFETY (NEEDLE) ×2 IMPLANT
NEEDLE SPNL 22GX3.5 QUINCKE BK (NEEDLE) ×2 IMPLANT
NS IRRIG 1000ML POUR BTL (IV SOLUTION) ×2 IMPLANT
PACK LAMINECTOMY NEURO (CUSTOM PROCEDURE TRAY) ×2 IMPLANT
PAD ARMBOARD 7.5X6 YLW CONV (MISCELLANEOUS) ×6 IMPLANT
RUBBERBAND STERILE (MISCELLANEOUS) ×4 IMPLANT
SPONGE SURGIFOAM ABS GEL SZ50 (HEMOSTASIS) ×2 IMPLANT
STRIP CLOSURE SKIN 1/2X4 (GAUZE/BANDAGES/DRESSINGS) ×2 IMPLANT
SUT VIC AB 2-0 CT1 18 (SUTURE) ×2 IMPLANT
SUT VIC AB 3-0 SH 8-18 (SUTURE) ×2 IMPLANT
TOWEL OR 17X24 6PK STRL BLUE (TOWEL DISPOSABLE) ×2 IMPLANT
TOWEL OR 17X26 10 PK STRL BLUE (TOWEL DISPOSABLE) ×2 IMPLANT
WATER STERILE IRR 1000ML POUR (IV SOLUTION) ×2 IMPLANT

## 2015-12-03 NOTE — Anesthesia Postprocedure Evaluation (Signed)
Anesthesia Post Note  Patient: Roy Moore  Procedure(s) Performed: Procedure(s) (LRB): Microdiscectomy - bilateral - Lumbar four-lumbar five (Bilateral)  Patient location during evaluation: PACU Anesthesia Type: General Level of consciousness: sedated Pain management: pain level controlled Vital Signs Assessment: post-procedure vital signs reviewed and stable Respiratory status: spontaneous breathing and respiratory function stable Cardiovascular status: stable Anesthetic complications: no    Last Vitals:  Vitals:   12/03/15 1432 12/03/15 1449  BP:  (!) 152/89  Pulse:  80  Resp:  20  Temp: 36.6 C 36.6 C    Last Pain:  Vitals:   12/03/15 1340  TempSrc:   PainSc: 2                  Allyn Bartelson DANIEL

## 2015-12-03 NOTE — Discharge Summary (Signed)
Physician Discharge Summary  Patient ID: Roy Moore MRN: [id number] DOB/AGE: 1955/11/30 60 y.o.  Admit date: 12/03/2015 Discharge date: 12/03/2015  Admission Diagnoses:  Discharge Diagnoses:  Active Problems:   Lumbar stenosis with neurogenic claudication   Discharged Condition: good  Hospital Course: Patient admitted to hospital where he underwent uncomplicated bilateral [id number] decompressive surgery. Postoperative use doing well. Ambulatory. Pain controlled. Ready for discharge home.  Consults:   Significant Diagnostic Studies:   Treatments:   Discharge Exam: Blood pressure (!) 142/69, pulse 83, temperature 97.7 F (36.5 C), resp. rate 14, weight 73.9 kg (163 lb), SpO2 100 %. Awake and alert. Oriented and appropriate. Motor and sensory function stable. Wound clean and dry. Chest and abdomen benign.  Disposition: 01-Home or Self Care     Medication List    TAKE these medications   ALPRAZolam 1 MG tablet Commonly known as:  XANAX Take 1 tablet (1 mg total) by mouth 3 (three) times daily as needed. for anxiety   amitriptyline 50 MG tablet Commonly known as:  ELAVIL Take 1 tablet (50 mg total) by mouth at bedtime.   aspirin EC 81 MG tablet Take 81 mg by mouth daily.   B-complex with vitamin C tablet Take 1 tablet by mouth daily.   cyclobenzaprine 10 MG tablet Commonly known as:  FLEXERIL Take 1 tablet (10 mg total) by mouth 3 (three) times daily as needed for muscle spasms.   HYDROcodone-acetaminophen 5-325 MG tablet Commonly known as:  NORCO/VICODIN Take 1-2 tablets by mouth every 4 (four) hours as needed (mild pain).   lovastatin 20 MG tablet Commonly known as:  MEVACOR Take 1 tablet (20 mg total) by mouth at bedtime.   MULTIVITAMIN ADULTS 50+ Tabs Take 1 tablet by mouth daily.   VITAMIN B-1 PO Take 1 tablet by mouth daily.   Vitamin D3 5000 units Tabs Take 5,000 Units by mouth daily.      Follow-up Information    Massimiliano Rohleder A, MD Follow up.    Specialty:  Neurosurgery Contact information: [address] [place] [address] [phone]           Signed: Charlie Pitter 12/03/2015, 1:31 PM

## 2015-12-03 NOTE — Anesthesia Procedure Notes (Signed)
Procedure Name: Intubation Date/Time: 12/03/2015 11:06 AM Performed by: Rush Farmer E Pre-anesthesia Checklist: Patient identified, Emergency Drugs available, Suction available and Patient being monitored Patient Re-evaluated:Patient Re-evaluated prior to inductionOxygen Delivery Method: Circle system utilized Preoxygenation: Pre-oxygenation with 100% oxygen Intubation Type: IV induction Ventilation: Mask ventilation without difficulty and Oral airway inserted - appropriate to patient size Laryngoscope Size: Mac and 4 Grade View: Grade I Tube type: Oral Tube size: 7.5 mm Number of attempts: 1 Airway Equipment and Method: Stylet Placement Confirmation: ETT inserted through vocal cords under direct vision,  CO2 detector and breath sounds checked- equal and bilateral Secured at: 21 cm Tube secured with: Tape Dental Injury: Teeth and Oropharynx as per pre-operative assessment

## 2015-12-03 NOTE — Brief Op Note (Signed)
12/03/2015  12:57 PM  PATIENT:  Roy Moore  60 y.o. male  PRE-OPERATIVE DIAGNOSIS:  stenosis - lumbar  POST-OPERATIVE DIAGNOSIS:  stenosis - lumbar  PROCEDURE:  Procedure(s): Microdiscectomy - bilateral - Lumbar four-lumbar five (Bilateral)  SURGEON:  Surgeon(s) and Role:    * Earnie Larsson, MD - Primary    * Consuella Lose, MD - Assisting  PHYSICIAN ASSISTANT:   ASSISTANTS:    ANESTHESIA:   general  EBL:  Total I/O In: -  Out: 25 [Blood:25]  BLOOD ADMINISTERED:none  DRAINS: none   LOCAL MEDICATIONS USED:  MARCAINE     SPECIMEN:  No Specimen  DISPOSITION OF SPECIMEN:  [name]  COUNTS:  YES  TOURNIQUET:  * No tourniquets in log *  DICTATION: .Dragon Dictation  PLAN OF CARE: Admit to inpatient   PATIENT DISPOSITION:  PACU - hemodynamically stable.   Delay start of Pharmacological VTE agent (>24hrs) due to surgical blood loss or risk of bleeding: yes

## 2015-12-03 NOTE — Op Note (Signed)
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 Left L4-5 microdiscectomy  Surgeon:Davyd Podgorski A.Braxton Weisbecker, M.D.  Asst. Surgeon: Kathyrn Sheriff  Anesthesia: General  Indication: 60 year old male with severe bilateral lower extremity pain and weakness. Workup demonstrates evidence of critical stenosis at L4-5 secondary to spondylosis and a broad-based central disc herniation. Patient is status post previous surgery at both L3-4 and L4-5.  Operative note: After induction of anesthesia, patient position prone onto Wilson frame and a properly padded. Lumbar region prepped and draped sterilely. Incision made overlying L4-5. Dissection performed bilaterally. Retractor placed. X-ray taken. Level confirmed. Bilateral decompressive laminotomies performed using high-speed drill Kerrison years to remove the inferior aspect lamina of L4 the medial aspect the L4-5 facet joint and the superior rim the L5 lamina bilaterally. Previous epidural scar was dissected through on the left side. Ligament flavum and epidural scar were elevated and resected. Underlying thecal sac was identified. Decompressive foraminotomies were then performed along the course the exiting L4 and L5 nerve roots bilaterally. Microscope front field using microdissection. On the left side thecal sac and nerve roots were retracted. Disc herniation was incised. Discectomies and performed using pituitary rongeurs operative angle pituitary rongeurs and Epstein curettes. All elements of the disc herniation were resected. All loose or obviously degenerative disc 0 was removed and interspace. At this point a very thorough discectomy been achieved. There was no evidence of injury to  the thecal sac or nerve roots. Wound is then irrigated with and like solution. Gelfoam was placed topically for hemostasis. Wounds and close in layers with Vicryl sutures. Steri-Strips and sterile dressing were applied. No apparent complications. Patient tolerated procedure well and he returns to the recovery room postop.

## 2015-12-03 NOTE — H&P (Signed)
Roy Moore is an 60 y.o. male.   Chief Complaint: Bilateral leg pain and weakness HPI: Patient is 60 year old male with progressive lower back pain with bilateral lower extremity weakness. Workup demonstrates evidence of severe stenosis at L4-5 with a central disc herniation. Patient presents now for bilateral laminotomy and microdiscectomy.  Past Medical History:  Diagnosis Date  . Anxiety   . Hyperlipidemia   . Stroke [facility]) 2009   no residual    Past Surgical History:  Procedure Laterality Date  . BACK SURGERY     10 yrs ago or so-fusion in neck   . CERVICAL FUSION    . COLONOSCOPY     15-20 yrs ago-normal per pt.   . COLONOSCOPY W/ POLYPECTOMY    . CYSTOSCOPY     kidney stools removed   . teeth removal      Family History  Problem Relation Age of Onset  . Colon cancer Neg Hx   . Rectal cancer Neg Hx   . Stomach cancer Neg Hx   . Esophageal cancer Neg Hx    Social History:  reports that he has been smoking Cigarettes.  He has a 47.00 pack-year smoking history. He has never used smokeless tobacco. He reports that he does not drink alcohol or use drugs.  Allergies:  Allergies  Allergen Reactions  . No Known Allergies     Medications Prior to Admission  Medication Sig Dispense Refill  . ALPRAZolam (XANAX) 1 MG tablet Take 1 tablet (1 mg total) by mouth 3 (three) times daily as needed. for anxiety 90 tablet 5  . amitriptyline (ELAVIL) 50 MG tablet Take 1 tablet (50 mg total) by mouth at bedtime. 60 tablet 6  . aspirin EC 81 MG tablet Take 81 mg by mouth daily.    . B Complex-C (B-COMPLEX WITH VITAMIN C) tablet Take 1 tablet by mouth daily.    . Cholecalciferol (VITAMIN D3) 5000 units TABS Take 5,000 Units by mouth daily.    Marland Kitchen lovastatin (MEVACOR) 20 MG tablet Take 1 tablet (20 mg total) by mouth at bedtime. 90 tablet 3  . Multiple Vitamins-Minerals (MULTIVITAMIN ADULTS 50+) TABS Take 1 tablet by mouth daily.    . Thiamine HCl (VITAMIN B-1 PO) Take 1 tablet by  mouth daily.       Results for orders placed or performed during the hospital encounter of 12/03/15 (from the past 48 hour(s))  Basic metabolic panel     Status: None   Collection Time: 12/03/15  9:31 AM  Result Value Ref Range   Sodium 135 135 - 145 mmol/L   Potassium 3.8 3.5 - 5.1 mmol/L   Chloride 101 101 - 111 mmol/L   CO2 23 22 - 32 mmol/L   Glucose, Bld 95 65 - 99 mg/dL   BUN 12 6 - 20 mg/dL   Creatinine, Ser 1.09 0.61 - 1.24 mg/dL   Calcium 9.5 8.9 - 10.3 mg/dL   GFR calc non Af Amer >60 >60 mL/min   GFR calc Af Amer >60 >60 mL/min    Comment: (NOTE) The eGFR has been calculated using the CKD EPI equation. This calculation has not been validated in all clinical situations. eGFR's persistently <60 mL/min signify possible Chronic Kidney Disease.    Anion gap 11 5 - 15  CBC WITH DIFFERENTIAL     Status: Abnormal   Collection Time: 12/03/15  9:31 AM  Result Value Ref Range   WBC 13.1 (H) 4.0 - 10.5 K/uL   RBC  4.55 4.22 - 5.81 MIL/uL   Hemoglobin 13.0 13.0 - 17.0 g/dL   HCT 38.8 (L) 39.0 - 52.0 %   MCV 85.3 78.0 - 100.0 fL   MCH 28.6 26.0 - 34.0 pg   MCHC 33.5 30.0 - 36.0 g/dL   RDW 14.2 11.5 - 15.5 %   Platelets 281 150 - 400 K/uL   Neutrophils Relative % 77 %   Neutro Abs 10.1 (H) 1.7 - 7.7 K/uL   Lymphocytes Relative 14 %   Lymphs Abs 1.8 0.7 - 4.0 K/uL   Monocytes Relative 7 %   Monocytes Absolute 0.9 0.1 - 1.0 K/uL   Eosinophils Relative 2 %   Eosinophils Absolute 0.3 0.0 - 0.7 K/uL   Basophils Relative 0 %   Basophils Absolute 0.1 0.0 - 0.1 K/uL   No results found.  Pertinent items noted in HPI and remainder of comprehensive ROS otherwise negative.  Blood pressure 135/82, pulse 89, temperature 98.3 F (36.8 C), temperature source Oral, resp. rate 16, weight 73.9 kg (163 lb), SpO2 100 %.  Patient is awake and alert. He is oriented and appropriate. Speech is fluent. Judgment and insight are intact. Cranial nerve function normal bilaterally. Motor  examination of the extremities reveals weakness of dorsiflexion bilaterally. He has decreased sensation. Light touch in his L5 and S1 dermatomes bilaterally. Straight raising positive bilaterally. Examination head ears eyes and throat is unremarkable. Chest and abdomen benign. Extremities free from injury or deformity. Assessment/Plan L4-5 severe stenosis with central L4-5 disc herniation. Plan bilateral L4-5 decompressive laminotomies and foraminotomies with probable bilateral microdiscectomy. Risks and benefits of been explained. Patient wishes to proceed.  Roy Moore A 12/03/2015, 10:41 AM

## 2015-12-03 NOTE — Progress Notes (Signed)
Pt doing well. Pt and wife given D/C instructions with Rx's, verbal understanding was provided. Pt's IV was removed prior to D/C. Pt D/C'd home via walking @ 1645 per MD order. Pt is stable @ D/C and has no other needs at this time. Holli Humbles, RN

## 2015-12-03 NOTE — Anesthesia Preprocedure Evaluation (Addendum)
Anesthesia Evaluation  Patient identified by MRN, date of birth, ID band Patient awake    Reviewed: Allergy & Precautions  History of Anesthesia Complications Negative for: history of anesthetic complications  Airway Mallampati: I  TM Distance: >3 FB Neck ROM: Full    Dental  (+) Edentulous Upper, Edentulous Lower, Dental Advisory Given   Pulmonary Current Smoker,    Pulmonary exam normal        Cardiovascular negative cardio ROS Normal cardiovascular exam     Neuro/Psych PSYCHIATRIC DISORDERS Anxiety CVA, No Residual Symptoms    GI/Hepatic Neg liver ROS, GERD  ,  Endo/Other  negative endocrine ROS  Renal/GU negative Renal ROS     Musculoskeletal   Abdominal   Peds  Hematology   Anesthesia Other Findings   Reproductive/Obstetrics                            Anesthesia Physical Anesthesia Plan  ASA: III  Anesthesia Plan: General   Post-op Pain Management:    Induction: Intravenous  Airway Management Planned: Oral ETT  Additional Equipment:   Intra-op Plan:   Post-operative Plan: Extubation in OR  Informed Consent: I have reviewed the patients History and Physical, chart, labs and discussed the procedure including the risks, benefits and alternatives for the proposed anesthesia with the patient or authorized representative who has indicated his/her understanding and acceptance.   Dental advisory given  Plan Discussed with: CRNA and Anesthesiologist  Anesthesia Plan Comments:         Anesthesia Quick Evaluation

## 2015-12-03 NOTE — Transfer of Care (Signed)
Immediate Anesthesia Transfer of Care Note  Patient: Roy Moore  Procedure(s) Performed: Procedure(s): Microdiscectomy - bilateral - Lumbar four-lumbar five (Bilateral)  Patient Location: PACU  Anesthesia Type:General  Level of Consciousness: sedated  Airway & Oxygen Therapy: Patient Spontanous Breathing and Patient connected to nasal cannula oxygen  Post-op Assessment: Report given to RN and Post -op Vital signs reviewed and stable  Post vital signs: Reviewed and stable  Last Vitals:  Vitals:   12/03/15 0846  BP: 135/82  Pulse: 89  Resp: 16  Temp: 36.8 C    Last Pain:  Vitals:   12/03/15 0921  TempSrc:   PainSc: 10-Worst pain ever      Patients Stated Pain Goal: 4 ([id number])  Complications: No apparent anesthesia complications

## 2015-12-04 ENCOUNTER — Encounter (HOSPITAL_COMMUNITY): Payer: Self-pay | Admitting: Neurosurgery

## 2015-12-11 NOTE — Addendum Note (Signed)
Addended by: [name] R on: 12/11/2015 03:27 PM   Modules accepted: Orders

## 2016-01-16 ENCOUNTER — Other Ambulatory Visit: Payer: Self-pay | Admitting: Family Medicine

## 2016-01-16 DIAGNOSIS — G4700 Insomnia, unspecified: Secondary | ICD-10-CM

## 2016-01-16 DIAGNOSIS — F4323 Adjustment disorder with mixed anxiety and depressed mood: Secondary | ICD-10-CM

## 2016-01-17 ENCOUNTER — Other Ambulatory Visit: Payer: Self-pay

## 2016-01-17 DIAGNOSIS — F4323 Adjustment disorder with mixed anxiety and depressed mood: Secondary | ICD-10-CM

## 2016-01-17 NOTE — Telephone Encounter (Signed)
Xanax refill #90 requested per [facility] for Dr. Carlota Raspberry

## 2016-01-17 NOTE — Telephone Encounter (Signed)
Agree with previous notes.  I will not be in the office, but he can certainly meet with another provider to discuss anxiety treatment plan.

## 2016-01-17 NOTE — Telephone Encounter (Signed)
From Dr. Vonna Kotyk note.    "Chronic anxiety             -Discussed concerns of use of just Xanax 3 times a day, side effects and dependence risks discussed. Encouraged to consider SSRI or other anxiety treatment, but he was resistant to any changes in medications. Verified that he did have refill left at his pharmacy. He filled that on day of visit, follow-up in the next month to discuss that medication and plan for anxiety further.  Also cautioned him on use of Xanax with any narcotic pain medicines that may be prescribed after his upcoming surgery due to risk of accidental overdose."  He is too big a risk for refill and Dr. Carlota Raspberry wanted to see him back.  Advise that he return to clinic.   Philis Fendt, MS, PA-C 1:41 PM, 01/17/2016

## 2016-01-17 NOTE — Telephone Encounter (Signed)
Pt upset  Xanax was denied.  Made appt for Monday 12/27

## 2016-01-21 ENCOUNTER — Ambulatory Visit: Payer: Commercial Managed Care - HMO

## 2016-01-22 ENCOUNTER — Ambulatory Visit (INDEPENDENT_AMBULATORY_CARE_PROVIDER_SITE_OTHER): Payer: Commercial Managed Care - HMO | Admitting: Family Medicine

## 2016-01-22 VITALS — BP 162/76 | HR 72 | Temp 98.0°F | Resp 16 | Ht 69.0 in | Wt 161.0 lb

## 2016-01-22 DIAGNOSIS — F17210 Nicotine dependence, cigarettes, uncomplicated: Secondary | ICD-10-CM | POA: Diagnosis not present

## 2016-01-22 DIAGNOSIS — G4700 Insomnia, unspecified: Secondary | ICD-10-CM | POA: Diagnosis not present

## 2016-01-22 DIAGNOSIS — F17200 Nicotine dependence, unspecified, uncomplicated: Secondary | ICD-10-CM

## 2016-01-22 DIAGNOSIS — F4323 Adjustment disorder with mixed anxiety and depressed mood: Secondary | ICD-10-CM

## 2016-01-22 MED ORDER — ALPRAZOLAM 1 MG PO TABS: ORAL_TABLET | ORAL | 2 refills | Status: DC

## 2016-01-22 NOTE — Patient Instructions (Addendum)
I strongly urge you to work hard on getting rid of the cigarettes. Taper back about 1 cigarette every 3 or 4 days until your target date. Write it out on a calendar. On the date you have picked for quitting, when you get down to about 6 or 7 cigarettes, walk away from them and never touched them again. Make mildly even if there is a catastrophe and your family are alive you will not reach for a cigarette to calm you. Nobody can make you quit except you yourself, but it is very important for long-term health that you do so.  Continue using amitriptyline at night to help you rest.  Take the Xanax 1/2-1 pill 2-3 times a day, working hard to try and decrease taking them as regularly and just use a half a pill when possible or skipping a dose completely when possible.  Try to get some regular exercise as tolerated  Return to see Dr. Carlota Raspberry in about March or April.    IF you received an x-ray today, you will receive an invoice from [facility]. Please contact [facility] at [phone] with questions or concerns regarding your invoice.   IF you received labwork today, you will receive an invoice from [place]. Please contact [facility] at [phone] with questions or concerns regarding your invoice.   Our billing staff will not be able to assist you with questions regarding bills from these companies.  You will be contacted with the lab results as soon as they are available. The fastest way to get your results is to activate your My Chart account. Instructions are located on the last page of this paperwork. If you have not heard from [place] regarding the results in 2 weeks, please contact this office.

## 2016-01-22 NOTE — Progress Notes (Signed)
Patient ID: Roy Moore, male    DOB: Aug 03, 1955  Age: 60 y.o. MRN: [id number]  Chief Complaint  Patient presents with  . Medication Refill    xanax, amitriptyline    Subjective:   60 year old man who comes in for a refill on his Xanax and amitriptyline. Actually he has refills on the amitriptyline and I explained that to him. He takes the Xanax 1 pill 3 times a day, has been on it for many years. He does not feel like it is caused him any problems. He does continue to smoke about 1 pack of cigarettes a day despite the history of having had several strokes. He has had a back surgery since he was here the last time.  Current allergies, medications, problem list, past/family and social histories reviewed.  Objective:  BP (!) 162/76 (BP Location: Right Arm, Patient Position: Sitting, Cuff Size: Normal)   Pulse 72   Temp 98 F (36.7 C) (Oral)   Resp 16   Ht 5\' 9"  (1.753 m)   Wt 161 lb (73 kg)   SpO2 99%   BMI 23.78 kg/m   Pleasant gentleman, alert and oriented, no acute distress. Obvious tobacco use based on the staining of his mustache. His chest is clear with heart regular.  Assessment & Plan:   Assessment: 1. Tobacco use disorder   2. Insomnia, unspecified type   3. Adjustment disorder with mixed anxiety and depressed mood       Plan: Discussed with him the value of trying to decrease the alprazolam. I explained to him that as you get older it metabolizes less quickly out of the system and starts building up in the system more, making you at increased risk of falls or increased confusion. It is hard for him to understand that since he is had no perceived problems with the medicine over the years.  A long discussion regarding his need to quit cigarettes. He has tried medications in the past but side effects are a major concern to him. I explained that I think it was really wisest for him just to taper off and quit at his chosen quit date.  He needs to get regular  exercise.  No orders of the defined types were placed in this encounter.   Meds ordered this encounter  Medications  . ALPRAZolam (XANAX) 1 MG tablet    Sig: Take 1/2-1 tablet 2-3 times daily for anxiety. Try to minimize use and skip doses when possible.    Dispense:  90 tablet    Refill:  2         Patient Instructions   I strongly urge you to work hard on getting rid of the cigarettes. Taper back about 1 cigarette every 3 or 4 days until your target date. Write it out on a calendar. On the date you have picked for quitting, when you get down to about 6 or 7 cigarettes, walk away from them and never touched them again. Make mildly even if there is a catastrophe and your family are alive you will not reach for a cigarette to calm you. Nobody can make you quit except you yourself, but it is very important for long-term health that you do so.  Continue using amitriptyline at night to help you rest.  Take the Xanax 1/2-1 pill 2-3 times a day, working hard to try and decrease taking them as regularly and just use a half a pill when possible or skipping a dose completely when possible.  Try to get some regular exercise as tolerated  Return to see Dr. Carlota Moore in about March or April.    IF you received an x-ray today, you will receive an invoice from [facility]. Please contact [facility] at [phone] with questions or concerns regarding your invoice.   IF you received labwork today, you will receive an invoice from [place]. Please contact [facility] at [phone] with questions or concerns regarding your invoice.   Our billing staff will not be able to assist you with questions regarding bills from these companies.  You will be contacted with the lab results as soon as they are available. The fastest way to get your results is to activate your My Chart account. Instructions are located on the last page of this paperwork. If you have not heard from [place] regarding  the results in 2 weeks, please contact this office.         No Follow-up on file.   Markesha Hannig, MD 01/22/2016

## 2016-01-25 NOTE — Telephone Encounter (Signed)
Pt was seen 12/27 & xanax was refilled

## 2016-02-17 DIAGNOSIS — R319 Hematuria, unspecified: Secondary | ICD-10-CM | POA: Diagnosis not present

## 2016-02-17 DIAGNOSIS — N200 Calculus of kidney: Secondary | ICD-10-CM | POA: Diagnosis not present

## 2016-03-04 DIAGNOSIS — M5412 Radiculopathy, cervical region: Secondary | ICD-10-CM | POA: Diagnosis not present

## 2016-03-04 DIAGNOSIS — M4802 Spinal stenosis, cervical region: Secondary | ICD-10-CM | POA: Diagnosis not present

## 2016-03-04 DIAGNOSIS — M542 Cervicalgia: Secondary | ICD-10-CM | POA: Diagnosis not present

## 2016-03-04 DIAGNOSIS — I10 Essential (primary) hypertension: Secondary | ICD-10-CM | POA: Diagnosis not present

## 2016-03-26 DIAGNOSIS — M5412 Radiculopathy, cervical region: Secondary | ICD-10-CM | POA: Diagnosis not present

## 2016-04-01 DIAGNOSIS — R311 Benign essential microscopic hematuria: Secondary | ICD-10-CM | POA: Diagnosis not present

## 2016-04-01 DIAGNOSIS — N200 Calculus of kidney: Secondary | ICD-10-CM | POA: Diagnosis not present

## 2016-04-01 DIAGNOSIS — R972 Elevated prostate specific antigen [PSA]: Secondary | ICD-10-CM | POA: Diagnosis not present

## 2016-04-07 ENCOUNTER — Ambulatory Visit: Payer: Commercial Managed Care - HMO

## 2016-04-07 DIAGNOSIS — M48062 Spinal stenosis, lumbar region with neurogenic claudication: Secondary | ICD-10-CM | POA: Diagnosis not present

## 2016-04-12 ENCOUNTER — Encounter (HOSPITAL_COMMUNITY): Payer: Self-pay | Admitting: Emergency Medicine

## 2016-04-12 ENCOUNTER — Emergency Department (HOSPITAL_COMMUNITY): Payer: Medicare HMO

## 2016-04-12 ENCOUNTER — Emergency Department (HOSPITAL_COMMUNITY)
Admission: EM | Admit: 2016-04-12 | Discharge: 2016-04-12 | Disposition: A | Discharge status: Home / Self Care | Payer: Medicare HMO | Attending: Emergency Medicine | Admitting: Emergency Medicine

## 2016-04-12 DIAGNOSIS — F17210 Nicotine dependence, cigarettes, uncomplicated: Secondary | ICD-10-CM | POA: Diagnosis not present

## 2016-04-12 DIAGNOSIS — Z79899 Other long term (current) drug therapy: Secondary | ICD-10-CM | POA: Diagnosis not present

## 2016-04-12 DIAGNOSIS — Z7982 Long term (current) use of aspirin: Secondary | ICD-10-CM | POA: Insufficient documentation

## 2016-04-12 DIAGNOSIS — Z8673 Personal history of transient ischemic attack (TIA), and cerebral infarction without residual deficits: Secondary | ICD-10-CM | POA: Insufficient documentation

## 2016-04-12 DIAGNOSIS — M25471 Effusion, right ankle: Secondary | ICD-10-CM | POA: Diagnosis not present

## 2016-04-12 DIAGNOSIS — M25571 Pain in right ankle and joints of right foot: Secondary | ICD-10-CM | POA: Diagnosis not present

## 2016-04-12 DIAGNOSIS — M7989 Other specified soft tissue disorders: Secondary | ICD-10-CM | POA: Diagnosis not present

## 2016-04-12 LAB — COMPREHENSIVE METABOLIC PANEL
ALT: 13 U/L — ABNORMAL LOW (ref 17–63)
AST: 16 U/L (ref 15–41)
Albumin: 2.7 g/dL — ABNORMAL LOW (ref 3.5–5.0)
Alkaline Phosphatase: 79 U/L (ref 38–126)
Anion gap: 11 (ref 5–15)
BUN: 20 mg/dL (ref 6–20)
CHLORIDE: 97 mmol/L — AB (ref 101–111)
CO2: 23 mmol/L (ref 22–32)
Calcium: 8.7 mg/dL — ABNORMAL LOW (ref 8.9–10.3)
Creatinine, Ser: 1.21 mg/dL (ref 0.61–1.24)
GFR calc Af Amer: >60 mL/min (ref >60)
Glucose, Bld: 110 mg/dL — ABNORMAL HIGH (ref 65–99)
POTASSIUM: 4.1 mmol/L (ref 3.5–5.1)
SODIUM: 131 mmol/L — AB (ref 135–145)
Total Bilirubin: 0.5 mg/dL (ref 0.3–1.2)
Total Protein: 7.1 g/dL (ref 6.5–8.1)

## 2016-04-12 LAB — CBC WITH DIFFERENTIAL/PLATELET
Basophils Absolute: 0 10*3/uL (ref 0.0–0.1)
Basophils Relative: 0 %
EOS ABS: 0.2 10*3/uL (ref 0.0–0.7)
EOS PCT: 1 %
HCT: 31.9 % — ABNORMAL LOW (ref 39.0–52.0)
Hemoglobin: 10.2 g/dL — ABNORMAL LOW (ref 13.0–17.0)
Lymphocytes Relative: 5 %
Lymphs Abs: 1.1 10*3/uL (ref 0.7–4.0)
MCH: 26.4 pg (ref 26.0–34.0)
MCHC: 32 g/dL (ref 30.0–36.0)
MCV: 82.6 fL (ref 78.0–100.0)
MONO ABS: 1 10*3/uL (ref 0.1–1.0)
Monocytes Relative: 5 %
Neutro Abs: 18.9 10*3/uL — ABNORMAL HIGH (ref 1.7–7.7)
Neutrophils Relative %: 89 %
PLATELETS: 388 10*3/uL (ref 150–400)
RBC: 3.86 MIL/uL — AB (ref 4.22–5.81)
RDW: 15.2 % (ref 11.5–15.5)
WBC: 21.2 10*3/uL — AB (ref 4.0–10.5)

## 2016-04-12 LAB — I-STAT TROPONIN, ED: TROPONIN I, POC: 0 ng/mL (ref 0.00–0.08)

## 2016-04-12 NOTE — ED Provider Notes (Signed)
[place] DEPT Provider Note   CSN: [id number] Arrival date & time: 04/12/16  1334  By signing my name below, I, Hansel Feinstein, attest that this documentation has been prepared under the direction and in the presence of Blanchie Dessert, MD. Electronically Signed: Hansel Feinstein, ED Scribe. 04/12/16. 2:48 PM.   History   Chief Complaint Chief Complaint  Patient presents with  . Foot Pain  . Ankle Pain    HPI Roy Moore is a 61 y.o. 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 Pt had recent back surgery >1 month ago and last saw his surgeon last week. Per wife, they mentioned the ankle swelling to the surgeon, who suspected a cardiac issue and provided a referral which is not until 06/01/16. Per pt, he has had persistent back pain and BLE pain including the knees and ankles since the surgery, which is worsened with ambulation. Pt is a current smoker. He recently started taking Diclofenac which has helped the swelling and pain in the ankle. Pt and wife deny CP, SOB.  No prior cardiac hx.  He also reports intermittent pain between his shoulder blades that began upon arriving to the ED. He reports h/o similar pain, which occurs ~every few weeks, but not as long lasting. His pain is not exertional and not accompanied by SOB. He denies recent falls, trauma or injury.   The history is provided by the patient and the spouse. No language interpreter was used.    Past Medical History:  Diagnosis Date  . Anxiety   . Hyperlipidemia   . Stroke [facility]) 2009   no residual    Patient Active Problem List   Diagnosis Date Noted  . Lumbar stenosis with neurogenic claudication 12/03/2015  . CVA, old, hemiparesis ([place]) 03/24/2011  . ANXIETY DISORDER 10/04/2007  . TOBACCO ABUSE 09/30/2007  . GERD 09/30/2007  . DYSLIPIDEMIA 09/15/2007  . STROKE WITH LEFT  HEMIPARESIS 09/15/2007    Past Surgical History:  Procedure Laterality Date  . BACK SURGERY     10 yrs ago or so-fusion in neck   . CERVICAL FUSION    . COLONOSCOPY     15-20 yrs ago-normal per pt.   . COLONOSCOPY W/ POLYPECTOMY    . CYSTOSCOPY     kidney stools removed   . LUMBAR LAMINECTOMY/DECOMPRESSION MICRODISCECTOMY Bilateral 12/03/2015   Procedure: Microdiscectomy - bilateral - Lumbar four-lumbar five;  Surgeon: Earnie Larsson, MD;  Location: [place];  Service: Neurosurgery;  Laterality: Bilateral;  . teeth removal         Home Medications    Prior to Admission medications   Medication Sig Start Date End Date Taking? Authorizing Provider  ALPRAZolam [name]) 1 MG tablet Take 1/2-1 tablet 2-3 times daily for anxiety. Try to minimize use and skip doses when possible. 01/22/16   Posey Boyer, MD  amitriptyline (ELAVIL) 50 MG tablet Take 1 tablet (50 mg total) by mouth at bedtime. 11/28/15   [name], MD  aspirin EC 81 MG tablet Take 81 mg by mouth daily.    Historical Provider, MD  B Complex-C (B-COMPLEX WITH VITAMIN C) tablet Take 1 tablet by mouth daily.    Historical Provider, MD  Cholecalciferol (VITAMIN D3) 5000 units TABS Take 5,000 Units by mouth daily.    Historical Provider, MD  cyclobenzaprine (FLEXERIL) 10 MG tablet Take 1 tablet (10 mg total)  by mouth 3 (three) times daily as needed for muscle spasms. 12/03/15   Earnie Larsson, MD  HYDROcodone-acetaminophen (NORCO/VICODIN) 5-325 MG tablet Take 1-2 tablets by mouth every 4 (four) hours as needed (mild pain). 12/03/15   Earnie Larsson, MD  lovastatin (MEVACOR) 20 MG tablet Take 1 tablet (20 mg total) by mouth at bedtime. 11/28/15   [name], MD  Multiple Vitamins-Minerals (MULTIVITAMIN ADULTS 50+) TABS Take 1 tablet by mouth daily.    Historical Provider, MD  Thiamine HCl (VITAMIN B-1 PO) Take 1 tablet by mouth daily.     Historical Provider, MD    Family History Family History  Problem Relation Age of Onset  .  Colon cancer Neg Hx   . Rectal cancer Neg Hx   . Stomach cancer Neg Hx   . Esophageal cancer Neg Hx     Social History Social History  Substance Use Topics  . Smoking status: Current Every Day Smoker    Packs/day: 1.00    Years: 47.00    Types: Cigarettes  . Smokeless tobacco: Never Used  . Alcohol use No     Comment: no alcohol in 23 years (12/02/15     Allergies   No known allergies   Review of Systems Review of Systems  Respiratory: Negative for shortness of breath.   Cardiovascular: Negative for chest pain.  Musculoskeletal: Positive for arthralgias, back pain, joint swelling (right ankle) and myalgias (shoulder blades, not exertional, no SOB).  All other systems reviewed and are negative.  Physical Exam Updated Vital Signs BP 140/61 (BP Location: Left Arm)   Pulse 82   Temp 98.2 F (36.8 C) (Oral)   Resp 20   SpO2 96%   Physical Exam  Constitutional: He appears well-developed and well-nourished.  HENT:  Head: Normocephalic.  Eyes: Conjunctivae are normal.  Cardiovascular: Normal rate, regular rhythm and intact distal pulses.   Faint DP pulses bilaterally on palpation. Doppler-able DP and PT pulses bilaterally.   Pulmonary/Chest: Effort normal. No respiratory distress. He has no wheezes. He has no rales.  Globally decreased breath sounds, but no wheezing.   Abdominal: He exhibits no distension.  Musculoskeletal: Normal range of motion. He exhibits edema and tenderness.  Tenderness over the right lateral and medial malleolus, no erythema. Swelling and warmth to the right ankle.  No pitting edema  Neurological: He is alert.  Skin: Skin is warm and dry.  Capillary refill 3 seconds in both feet.  Psychiatric: He has a normal mood and affect. His behavior is normal.  Nursing note and vitals reviewed.  ED Treatments / Results   DIAGNOSTIC STUDIES: Oxygen Saturation is 96% on RA, adequate by my interpretation.    COORDINATION OF CARE: 2:37 PM Discussed  treatment plan with pt at bedside which includes XR, labs and pt agreed to plan.   Labs (all labs ordered are listed, but only abnormal results are displayed) Labs Reviewed  CBC WITH DIFFERENTIAL/PLATELET - Abnormal; Notable for the following:       Result Value   WBC 21.2 (*)    RBC 3.86 (*)    Hemoglobin 10.2 (*)    HCT 31.9 (*)    Neutro Abs 18.9 (*)    All other components within normal limits  COMPREHENSIVE METABOLIC PANEL - Abnormal; Notable for the following:    Sodium 131 (*)    Chloride 97 (*)    Glucose, Bld 110 (*)    Calcium 8.7 (*)    Albumin 2.7 (*)  ALT 13 (*)    All other components within normal limits  I-STAT TROPOININ, ED    EKG  EKG Interpretation  Date/Time:  Sunday April 12 2016 15:14:25 EDT Ventricular Rate:  94 PR Interval:  120 QRS Duration: 88 QT Interval:  326 QTC Calculation: 407 R Axis:   72 Text Interpretation:  Normal sinus rhythm Normal ECG No significant change since last tracing Confirmed by Maryan Rued  MD, Loree Fee ([id number]) on 04/12/2016 3:27:16 PM       Radiology Dg Chest 2 View  Result Date: 04/12/2016 CLINICAL DATA:  Intermittent, progressively worsening right ankle pain and swelling for the past 2 weeks. No known injury. Clinical concern for a cardiac cause. EXAM: CHEST  2 VIEW COMPARISON:  09/05/2010. FINDINGS: Normal sized heart. Clear lungs with normal vascularity. Small calcified granuloma at the left lateral costophrenic angle. Mild pectus excavatum. Lower thoracic spine degenerative changes. IMPRESSION: No acute abnormality. Electronically Signed   By: Claudie Revering M.D.   On: 04/12/2016 15:33   Dg Ankle Complete Right  Result Date: 04/12/2016 CLINICAL DATA:  Intermittent, progressively worsening right ankle pain and swelling for the past 2 weeks. No known injury. EXAM: RIGHT ANKLE - COMPLETE 3+ VIEW COMPARISON:  None. FINDINGS: Mild diffuse soft tissue swelling. Ankle joint effusion. Minimal calcaneal spur formation. Mild distal  medial malleolus spur formation. IMPRESSION: Ankle joint effusion, mild soft tissue swelling and mild degenerative changes. Electronically Signed   By: Claudie Revering M.D.   On: 04/12/2016 15:31    Procedures Procedures (including critical care time)  Medications Ordered in ED Medications - No data to display   Initial Impression / Assessment and Plan / ED Course  I have reviewed the triage vital signs and the nursing notes.  Pertinent labs & imaging results that were available during my care of the patient were reviewed by me and considered in my medical decision making (see chart for details).     Patient is a 61 year old male with a significant history from prior stroke and carotid stenosis who had back surgery approximately one month ago presenting with bilateral ankle swelling but worse in the right with ankle pain. Patient saw Dr. Trenton Gammon his neurosurgeon that performed a back surgery this past week and he did not feel that symptoms were related to the back. He was concern for vascular cause of his symptoms. Patient has dopplerable pulses and palpable pulses on exam with 3 second capillary refill. No evidence of ischemia. There is no signs of fluid overload on exam. Patient denies any recent trauma but patient does have warmth and swelling of the right ankle. Question whether patient has gout. Patient does admit that the swelling in the ankle and pain get better with taking diclofenac which she was recently started on by pain management. Patient does have follow-up appointment with cardiology in May but he and his wife wanted to make sure there was nothing going on that he would need evaluation for sooner. He is in no acute distress at this time. Vital Signs are within normal limits.  Patient does complain of some intermittent shoulder blade pain but it does not seem to be exertional or related to any particular activity. He does not get associated symptoms with this and feel it's most likely  musculoskeletal in nature. However will do a screening EKG, chest x-ray, ankle film, CBC, CMP and troponin.  3:59 PM Labs without significant finding except for leukocytosis of 21,000 however patient received a cortisone injection this week which is  most likely the cause of the abnormal white count. There are no evidence of infectious process at this time. Chest x-ray and troponin within normal limits. EKG within normal limits. Ankle image with swelling noted. Possibility that this is arthritis versus gout. Discussed with this family and they will follow-up with his pain management physician and given referral to orthopedics.  Final Clinical Impressions(s) / ED Diagnoses   Final diagnoses:  Acute right ankle pain    New Prescriptions New Prescriptions   No medications on file    I personally performed the services described in this documentation, which was scribed in my presence.  The recorded information has been reviewed and considered.    Blanchie Dessert, MD 04/12/16 [phone]

## 2016-04-12 NOTE — ED Triage Notes (Signed)
Pt. Stated, I've had foot and ankle pain for over a month.  No injury

## 2016-04-12 NOTE — ED Notes (Signed)
Patient transported to X-ray 

## 2016-04-14 ENCOUNTER — Other Ambulatory Visit: Payer: Self-pay | Admitting: Surgery

## 2016-04-14 DIAGNOSIS — R202 Paresthesia of skin: Secondary | ICD-10-CM

## 2016-04-23 DIAGNOSIS — M4802 Spinal stenosis, cervical region: Secondary | ICD-10-CM | POA: Diagnosis not present

## 2016-04-23 DIAGNOSIS — M5412 Radiculopathy, cervical region: Secondary | ICD-10-CM | POA: Diagnosis not present

## 2016-05-14 DIAGNOSIS — M4802 Spinal stenosis, cervical region: Secondary | ICD-10-CM | POA: Diagnosis not present

## 2016-05-20 ENCOUNTER — Encounter: Payer: Self-pay | Admitting: Surgery

## 2016-05-26 DIAGNOSIS — I70211 Atherosclerosis of native arteries of extremities with intermittent claudication, right leg: Secondary | ICD-10-CM

## 2016-05-26 HISTORY — DX: Atherosclerosis of native arteries of extremities with intermittent claudication, right leg: I70.211

## 2016-06-01 ENCOUNTER — Ambulatory Visit (HOSPITAL_COMMUNITY)
Admission: RE | Admit: 2016-06-01 | Discharge: 2016-06-01 | Disposition: A | Discharge status: Home / Self Care | Payer: Medicare HMO | Source: Ambulatory Visit | Attending: Surgery | Admitting: Surgery

## 2016-06-01 ENCOUNTER — Ambulatory Visit (INDEPENDENT_AMBULATORY_CARE_PROVIDER_SITE_OTHER): Payer: Medicare HMO | Admitting: Surgery

## 2016-06-01 ENCOUNTER — Encounter: Payer: Self-pay | Admitting: Surgery

## 2016-06-01 ENCOUNTER — Other Ambulatory Visit: Payer: Self-pay

## 2016-06-01 ENCOUNTER — Other Ambulatory Visit: Payer: Self-pay | Admitting: Family Medicine

## 2016-06-01 ENCOUNTER — Other Ambulatory Visit: Payer: Self-pay | Admitting: Surgery

## 2016-06-01 VITALS — BP 148/80 | HR 76 | Temp 97.0°F | Resp 16 | Ht 69.0 in | Wt 133.0 lb

## 2016-06-01 DIAGNOSIS — E785 Hyperlipidemia, unspecified: Secondary | ICD-10-CM | POA: Diagnosis not present

## 2016-06-01 DIAGNOSIS — I739 Peripheral vascular disease, unspecified: Secondary | ICD-10-CM

## 2016-06-01 DIAGNOSIS — R202 Paresthesia of skin: Secondary | ICD-10-CM

## 2016-06-01 DIAGNOSIS — G4700 Insomnia, unspecified: Secondary | ICD-10-CM

## 2016-06-01 DIAGNOSIS — F4323 Adjustment disorder with mixed anxiety and depressed mood: Secondary | ICD-10-CM

## 2016-06-01 DIAGNOSIS — I70201 Unspecified atherosclerosis of native arteries of extremities, right leg: Secondary | ICD-10-CM

## 2016-06-01 NOTE — Progress Notes (Signed)
[facility]  Patient name: Roy Moore MRN: [id number] DOB: 08/11/1955 Sex: male   REFERRING PROVIDER:    Dr. Trenton Gammon   REASON FOR CONSULT:    Possible PAD  HISTORY OF PRESENT ILLNESS:   Roy Moore is a 61 y.o. male, who is Referred by Dr. Trenton Gammon for evaluation of leg pain.  The patient has been dealing with chronic pain and both legs.  He states the right leg is worse than the left.  He was recently in the emergency department and diagnosed with gout but given no medication.  He does take chronic pain medicine.  He states that he will wake up in the middle of the night with pain.  It can happen during the day or at night.  There are no aggravating factors.  He describes the pain as beginning in his feet and shooting upwards towards his knee.  He does not have any open wounds.  The patient reports a 30 pound weight loss over the course of the past 6 months.  He is a current smoker.  He takes a statin for hypercholesterolemia.  He does have a history of stroke.  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  PAST MEDICAL HISTORY    Past Medical History:  Diagnosis Date  . Anxiety   . Hyperlipidemia   . Stroke [facility]) 2009   no residual     FAMILY HISTORY   Family History  Problem Relation Age of Onset  . Colon cancer Neg Hx   . Rectal cancer Neg Hx   . Stomach cancer Neg Hx   . Esophageal cancer Neg Hx     SOCIAL HISTORY:   Social History   Social History  . Marital status: Married    Spouse name: [name]  . Number of children: [name]  . Years of education: [name]   Occupational History  . Not on file.   Social History Main Topics  . Smoking status: Current Every Day Smoker    Packs/day: 1.00    Years: 47.00    Types: Cigarettes  . Smokeless tobacco: Never Used  . Alcohol use No     Comment: no alcohol in 23 years (12/02/15  . Drug use: No  . Sexual activity: Not  on file   Other Topics Concern  . Not on file   Social History Narrative  . No narrative on file    ALLERGIES:    Allergies  Allergen Reactions  . No Known Allergies     CURRENT MEDICATIONS:    Current Outpatient Prescriptions  Medication Sig Dispense Refill  . ALPRAZolam (XANAX) 1 MG tablet Take 1/2-1 tablet 2-3 times daily for anxiety. Try to minimize use and skip doses when possible. 90 tablet 2  . amitriptyline (ELAVIL) 50 MG tablet Take 1 tablet (50 mg total) by mouth at bedtime. 60 tablet 6  . aspirin EC 81 MG tablet Take 81 mg by mouth daily.    . B Complex-C (B-COMPLEX WITH VITAMIN C) tablet Take 1 tablet by mouth daily.    . Cholecalciferol (VITAMIN D3) 5000 units TABS Take 5,000 Units by mouth daily.    . cyclobenzaprine (FLEXERIL) 10 MG tablet Take 1 tablet (10 mg total) by mouth 3 (three) times daily as needed for muscle spasms. 30 tablet 0  . diclofenac (VOLTAREN) 75 MG EC tablet     . gabapentin (NEURONTIN) 300 MG capsule     . lovastatin (MEVACOR) 20 MG  tablet Take 1 tablet (20 mg total) by mouth at bedtime. 90 tablet 3  . Multiple Vitamins-Minerals (MULTIVITAMIN ADULTS 50+) TABS Take 1 tablet by mouth daily.    Marland Kitchen oxyCODONE-acetaminophen (PERCOCET/ROXICET) 5-325 MG tablet     . Thiamine HCl (VITAMIN B-1 PO) Take 1 tablet by mouth daily.      No current facility-administered medications for this visit.     REVIEW OF SYSTEMS:   [X]  denotes positive finding, [ ]  denotes negative finding Cardiac  Comments:  Chest pain or chest pressure:    Shortness of breath upon exertion:    Short of breath when lying flat:    Irregular heart rhythm:        Vascular    Pain in calf, thigh, or hip brought on by ambulation: x   Pain in feet at night that wakes you up from your sleep:  x   Blood clot in your veins:    Leg swelling:         Pulmonary    Oxygen at home:    Productive cough:     Wheezing:         Neurologic    Sudden weakness in arms or legs:  x     Sudden numbness in arms or legs:  x   Sudden onset of difficulty speaking or slurred speech:    Temporary loss of vision in one eye:     Problems with dizziness:         Gastrointestinal    Blood in stool:      Vomited blood:         Genitourinary    Burning when urinating:     Blood in urine:        Psychiatric    Major depression:  x       Hematologic    Bleeding problems:    Problems with blood clotting too easily: x       Skin    Rashes or ulcers:        Constitutional    Fever or chills:     PHYSICAL EXAM:   Vitals:   06/01/16 1253 06/01/16 1255  BP: (!) 151/83 (!) 148/80  Pulse: 76 76  Resp: 16   Temp: 97 F (36.1 C)   TempSrc: Oral   SpO2: 98%   Weight: 133 lb (60.3 kg)   Height: 5\' 9"  (1.753 m)     GENERAL: The patient is a well-nourished male, in no acute distress. The vital signs are documented above. CARDIAC: There is a regular rate and rhythm.  VASCULAR: palpable left dorsalis pedis pulse, nonpalpable right PULMONARY: Nonlabored respirations ABDOMEN: Soft and non-tender with normal pitched bowel sounds.  MUSCULOSKELETAL: There are no major deformities or cyanosis. NEUROLOGIC: No focal weakness or paresthesias are detected. SKIN: There are no ulcers or rashes noted. PSYCHIATRIC: The patient has a normal affect.  STUDIES:   Carotid angio:  Bilateral carotid occlusion ABI:  R 0.44    L 0.99 R LE Duplex:  Possible R SFA occlusion  ASSESSMENT and PLAN   I suspect the patient's leg pain is multifactorial.  He does not give a classic description of vascular insufficiency as the source of his pain.  Since most of it is a shooting type pain, this is most likely neurogenic.  However, his right leg is significantly worse than his left, and that is the leg with significantly decreased blood flow.  I discussed with the patient that we could consider  performing angiography to define the true extent of the disease in his right leg and to intervene if  possible.  I told him that even with successful revascularization this may not alleviate his symptoms, however if I can improve circulation, it was help guide future decisions regarding the etiology of his pain.  I have scheduled him for angiography via a left femoral approach in interventional the right leg if feasible on Tuesday, May 15.  We discussed the risks and benefits of the procedure including the risk of distal embolization, the need for emergent surgery and limb loss.  All his questions were answered and he wishes to proceed.   [name], MD [facility] [phone] Pager [phone]

## 2016-06-02 NOTE — Telephone Encounter (Signed)
12/2015 last ov and refill Due for visit

## 2016-06-09 ENCOUNTER — Encounter (HOSPITAL_COMMUNITY)
Admission: RE | Disposition: A | Discharge status: Home / Self Care | Payer: Self-pay | Source: Ambulatory Visit | Attending: Surgery

## 2016-06-09 ENCOUNTER — Ambulatory Visit (HOSPITAL_COMMUNITY)
Admission: RE | Admit: 2016-06-09 | Discharge: 2016-06-09 | Disposition: A | Discharge status: Home / Self Care | Payer: Medicare HMO | Source: Ambulatory Visit | Attending: Surgery | Admitting: Surgery

## 2016-06-09 DIAGNOSIS — F1190 Opioid use, unspecified, uncomplicated: Secondary | ICD-10-CM | POA: Insufficient documentation

## 2016-06-09 DIAGNOSIS — Z7982 Long term (current) use of aspirin: Secondary | ICD-10-CM | POA: Diagnosis not present

## 2016-06-09 DIAGNOSIS — F17210 Nicotine dependence, cigarettes, uncomplicated: Secondary | ICD-10-CM | POA: Insufficient documentation

## 2016-06-09 DIAGNOSIS — F419 Anxiety disorder, unspecified: Secondary | ICD-10-CM | POA: Diagnosis not present

## 2016-06-09 DIAGNOSIS — G8929 Other chronic pain: Secondary | ICD-10-CM | POA: Diagnosis not present

## 2016-06-09 DIAGNOSIS — E7800 Pure hypercholesterolemia, unspecified: Secondary | ICD-10-CM | POA: Insufficient documentation

## 2016-06-09 DIAGNOSIS — I70211 Atherosclerosis of native arteries of extremities with intermittent claudication, right leg: Secondary | ICD-10-CM | POA: Diagnosis not present

## 2016-06-09 DIAGNOSIS — Z8673 Personal history of transient ischemic attack (TIA), and cerebral infarction without residual deficits: Secondary | ICD-10-CM | POA: Insufficient documentation

## 2016-06-09 DIAGNOSIS — M109 Gout, unspecified: Secondary | ICD-10-CM | POA: Diagnosis not present

## 2016-06-09 HISTORY — PX: ABDOMINAL AORTOGRAM W/LOWER EXTREMITY: CATH118223

## 2016-06-09 HISTORY — PX: PERIPHERAL VASCULAR INTERVENTION: CATH118257

## 2016-06-09 LAB — POCT I-STAT, CHEM 8
BUN: 19 mg/dL (ref 6–20)
CHLORIDE: 104 mmol/L (ref 101–111)
Calcium, Ion: 1.15 mmol/L (ref 1.15–1.40)
Creatinine, Ser: 1.2 mg/dL (ref 0.61–1.24)
Glucose, Bld: 138 mg/dL — ABNORMAL HIGH (ref 65–99)
HEMATOCRIT: 34 % — AB (ref 39.0–52.0)
HEMOGLOBIN: 11.6 g/dL — AB (ref 13.0–17.0)
POTASSIUM: 4 mmol/L (ref 3.5–5.1)
Sodium: 141 mmol/L (ref 135–145)
TCO2: 27 mmol/L (ref 0–100)

## 2016-06-09 LAB — POCT ACTIVATED CLOTTING TIME
ACTIVATED CLOTTING TIME: 197 s
ACTIVATED CLOTTING TIME: 169 s
Activated Clotting Time: 180 seconds

## 2016-06-09 SURGERY — ABDOMINAL AORTOGRAM W/LOWER EXTREMITY
Anesthesia: LOCAL | Laterality: Right

## 2016-06-09 MED ORDER — SODIUM CHLORIDE 0.9 % IV SOLN: 1.0000 mL/kg/h | INTRAVENOUS | Status: DC

## 2016-06-09 MED ORDER — OXYCODONE HCL 5 MG PO TABS: 5.0000 mg | ORAL_TABLET | ORAL | Status: DC | PRN

## 2016-06-09 MED ORDER — SODIUM CHLORIDE 0.9 % IV SOLN
INTRAVENOUS | Status: DC
  Administered 2016-06-09: 09:00:00 via INTRAVENOUS

## 2016-06-09 MED ORDER — FENTANYL CITRATE (PF) 100 MCG/2ML IJ SOLN
INTRAMUSCULAR | Status: DC | PRN
  Administered 2016-06-09 (×3): 50 ug via INTRAVENOUS

## 2016-06-09 MED ORDER — LIDOCAINE HCL (PF) 1 % IJ SOLN
INTRAMUSCULAR | Status: DC | PRN
  Administered 2016-06-09: 20 mL

## 2016-06-09 MED ORDER — MORPHINE SULFATE (PF) 10 MG/ML IV SOLN
2.0000 mg | INTRAVENOUS | Status: DC | PRN
  Administered 2016-06-09 (×2): 4 mg via INTRAVENOUS

## 2016-06-09 MED ORDER — HYDRALAZINE HCL 20 MG/ML IJ SOLN
INTRAMUSCULAR | Status: AC
  Filled 2016-06-09: qty 1

## 2016-06-09 MED ORDER — MIDAZOLAM HCL 2 MG/2ML IJ SOLN
INTRAMUSCULAR | Status: AC
Start: 2016-06-09 — End: 2016-06-09
  Filled 2016-06-09: qty 2

## 2016-06-09 MED ORDER — PHENOL 1.4 % MT LIQD
1.0000 | OROMUCOSAL | Status: DC | PRN
Start: 2016-06-09 — End: 2016-06-09

## 2016-06-09 MED ORDER — HEPARIN (PORCINE) IN NACL 2-0.9 UNIT/ML-% IJ SOLN
INTRAMUSCULAR | Status: AC
  Filled 2016-06-09: qty 1000

## 2016-06-09 MED ORDER — ALUM & MAG HYDROXIDE-SIMETH 200-200-20 MG/5ML PO SUSP: 15.0000 mL | ORAL | Status: DC | PRN

## 2016-06-09 MED ORDER — IODIXANOL 320 MG/ML IV SOLN
INTRAVENOUS | Status: DC | PRN
  Administered 2016-06-09: 140 mL via INTRA_ARTERIAL

## 2016-06-09 MED ORDER — HEPARIN SODIUM (PORCINE) 1000 UNIT/ML IJ SOLN
INTRAMUSCULAR | Status: AC
  Filled 2016-06-09: qty 1

## 2016-06-09 MED ORDER — ACETAMINOPHEN 325 MG PO TABS: 325.0000 mg | ORAL_TABLET | ORAL | Status: DC | PRN

## 2016-06-09 MED ORDER — MORPHINE SULFATE (PF) 4 MG/ML IV SOLN
INTRAVENOUS | Status: AC
  Filled 2016-06-09: qty 1

## 2016-06-09 MED ORDER — CLOPIDOGREL BISULFATE 75 MG PO TABS: 75.0000 mg | ORAL_TABLET | Freq: Every day | ORAL | Status: DC

## 2016-06-09 MED ORDER — FENTANYL CITRATE (PF) 100 MCG/2ML IJ SOLN
INTRAMUSCULAR | Status: AC
  Filled 2016-06-09: qty 2

## 2016-06-09 MED ORDER — DOCUSATE SODIUM 100 MG PO CAPS: 100.0000 mg | ORAL_CAPSULE | Freq: Every day | ORAL | Status: DC

## 2016-06-09 MED ORDER — FENTANYL CITRATE (PF) 100 MCG/2ML IJ SOLN
INTRAMUSCULAR | Status: AC
Start: 2016-06-09 — End: 2016-06-09
  Filled 2016-06-09: qty 2

## 2016-06-09 MED ORDER — ACETAMINOPHEN 325 MG RE SUPP: 325.0000 mg | RECTAL | Status: DC | PRN

## 2016-06-09 MED ORDER — LIDOCAINE HCL 1 % IJ SOLN
INTRAMUSCULAR | Status: AC
  Filled 2016-06-09: qty 20

## 2016-06-09 MED ORDER — CLOPIDOGREL BISULFATE 300 MG PO TABS
300.0000 mg | ORAL_TABLET | Freq: Once | ORAL | Status: AC
Start: 2016-06-09 — End: 2016-06-09
  Administered 2016-06-09: 300 mg via ORAL

## 2016-06-09 MED ORDER — HEPARIN SODIUM (PORCINE) 1000 UNIT/ML IJ SOLN
INTRAMUSCULAR | Status: DC | PRN
  Administered 2016-06-09: 6000 [IU] via INTRAVENOUS
  Administered 2016-06-09: 2000 [IU] via INTRAVENOUS

## 2016-06-09 MED ORDER — CLOPIDOGREL BISULFATE 300 MG PO TABS
ORAL_TABLET | ORAL | Status: AC
  Filled 2016-06-09: qty 1

## 2016-06-09 MED ORDER — METOPROLOL TARTRATE 5 MG/5ML IV SOLN: 2.0000 mg | INTRAVENOUS | Status: DC | PRN

## 2016-06-09 MED ORDER — LABETALOL HCL 5 MG/ML IV SOLN
10.0000 mg | INTRAVENOUS | Status: DC | PRN
  Administered 2016-06-09: 10 mg via INTRAVENOUS

## 2016-06-09 MED ORDER — CLOPIDOGREL BISULFATE 75 MG PO TABS: 75.0000 mg | ORAL_TABLET | Freq: Every day | ORAL | 11 refills | Status: DC

## 2016-06-09 MED ORDER — MIDAZOLAM HCL 2 MG/2ML IJ SOLN
INTRAMUSCULAR | Status: AC
  Filled 2016-06-09: qty 2

## 2016-06-09 MED ORDER — LABETALOL HCL 5 MG/ML IV SOLN
INTRAVENOUS | Status: AC
  Filled 2016-06-09: qty 4

## 2016-06-09 MED ORDER — GUAIFENESIN-DM 100-10 MG/5ML PO SYRP: 15.0000 mL | ORAL_SOLUTION | ORAL | Status: DC | PRN

## 2016-06-09 MED ORDER — HYDRALAZINE HCL 20 MG/ML IJ SOLN
5.0000 mg | INTRAMUSCULAR | Status: DC | PRN
  Administered 2016-06-09: 5 mg via INTRAVENOUS

## 2016-06-09 MED ORDER — MIDAZOLAM HCL 2 MG/2ML IJ SOLN
INTRAMUSCULAR | Status: DC | PRN
  Administered 2016-06-09 (×2): 2 mg via INTRAVENOUS

## 2016-06-09 MED ORDER — HEPARIN (PORCINE) IN NACL 2-0.9 UNIT/ML-% IJ SOLN
INTRAMUSCULAR | Status: AC | PRN
  Administered 2016-06-09: 1000 mL

## 2016-06-09 MED ORDER — FENTANYL CITRATE (PF) 100 MCG/2ML IJ SOLN
INTRAMUSCULAR | Status: DC | PRN
  Administered 2016-06-09: 50 ug via INTRAVENOUS

## 2016-06-09 MED ORDER — ONDANSETRON HCL 4 MG/2ML IJ SOLN: 4.0000 mg | Freq: Four times a day (QID) | INTRAMUSCULAR | Status: DC | PRN

## 2016-06-09 SURGICAL SUPPLY — 19 items
BALLN ARMADA 6X100X135 (BALLOONS) ×3
BALLOON ARMADA 6X100X135 (BALLOONS) IMPLANT
CATH OMNI FLUSH 5F 65CM (CATHETERS) ×1 IMPLANT
CATH QUICKCROSS SUPP .035X90CM (MICROCATHETER) ×1 IMPLANT
DEVICE TORQUE H2O (MISCELLANEOUS) ×1 IMPLANT
GUIDEWIRE ANGLED .035X260CM (WIRE) ×1 IMPLANT
KIT ENCORE 26 ADVANTAGE (KITS) ×1 IMPLANT
KIT MICROINTRODUCER STIFF 5F (SHEATH) ×1 IMPLANT
KIT PV (KITS) ×3 IMPLANT
SHEATH FLEXOR ANSEL 1 7F 45CM (SHEATH) ×1 IMPLANT
SHEATH PINNACLE 5F 10CM (SHEATH) ×1 IMPLANT
SHIELD RADPAD SCOOP 12X17 (MISCELLANEOUS) ×1 IMPLANT
STENT INNOVA 7X100X130 (Permanent Stent) ×1 IMPLANT
SYR MEDRAD MARK V 150ML (SYRINGE) ×3 IMPLANT
TAPE RADIOPAQUE TURBO (MISCELLANEOUS) ×1 IMPLANT
TRANSDUCER W/STOPCOCK (MISCELLANEOUS) ×3 IMPLANT
TRAY PV CATH (CUSTOM PROCEDURE TRAY) ×3 IMPLANT
WIRE BENTSON .035X145CM (WIRE) ×1 IMPLANT
WIRE ROSEN-J .035X260CM (WIRE) ×1 IMPLANT

## 2016-06-09 NOTE — Progress Notes (Signed)
20fr sheath aspirated and removed from LFA, manual pressure applied for 20 minutes. Groin level 0, no s+s of hematoma. Tegaderm dressing applied, bedrest instructions given.    Bilateral dp pulses palpable.   Bedrest begins at 14:30:00

## 2016-06-09 NOTE — Interval H&P Note (Signed)
History and Physical Interval Note:  06/09/2016 10:54 AM  Roy Moore  has presented today for surgery, with the diagnosis of pvd  The various methods of treatment have been discussed with the patient and family. After consideration of risks, benefits and other options for treatment, the patient has consented to  Procedure(s): Abdominal Aortogram w/Lower Extremity ([name]) as a surgical intervention .  The patient's history has been reviewed, patient examined, no change in status, stable for surgery.  I have reviewed the patient's chart and labs.  Questions were answered to the patient's satisfaction.     [name]

## 2016-06-09 NOTE — H&P (View-Only) (Signed)
[facility] [place]  Patient name: Roy Moore MRN: [id number] DOB: April 05, 1955 Sex: male   REFERRING PROVIDER:    Dr. Trenton Gammon   REASON FOR CONSULT:    Possible PAD  HISTORY OF PRESENT ILLNESS:   Roy Moore is a 61 y.o. male, who is Referred by Dr. Trenton Gammon for evaluation of leg pain.  The patient has been dealing with chronic pain and both legs.  He states the right leg is worse than the left.  He was recently in the emergency department and diagnosed with gout but given no medication.  He does take chronic pain medicine.  He states that he will wake up in the middle of the night with pain.  It can happen during the day or at night.  There are no aggravating factors.  He describes the pain as beginning in his feet and shooting upwards towards his knee.  He does not have any open wounds.  The patient reports a 30 pound weight loss over the course of the past 6 months.  He is a current smoker.  He takes a statin for hypercholesterolemia.  He does have a history of stroke.  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  PAST MEDICAL HISTORY    Past Medical History:  Diagnosis Date  . Anxiety   . Hyperlipidemia   . Stroke [facility]) 2009   no residual     FAMILY HISTORY   Family History  Problem Relation Age of Onset  . Colon cancer Neg Hx   . Rectal cancer Neg Hx   . Stomach cancer Neg Hx   . Esophageal cancer Neg Hx     SOCIAL HISTORY:   Social History   Social History  . Marital status: Married    Spouse name: [name]  . Number of children: [name]  . Years of education: [name]   Occupational History  . Not on file.   Social History Main Topics  . Smoking status: Current Every Day Smoker    Packs/day: 1.00    Years: 47.00    Types: Cigarettes  . Smokeless tobacco: Never Used  . Alcohol use No     Comment: no alcohol in 23 years (12/02/15  . Drug use: No  . Sexual activity: Not  on file   Other Topics Concern  . Not on file   Social History Narrative  . No narrative on file    ALLERGIES:    Allergies  Allergen Reactions  . No Known Allergies     CURRENT MEDICATIONS:    Current Outpatient Prescriptions  Medication Sig Dispense Refill  . ALPRAZolam (XANAX) 1 MG tablet Take 1/2-1 tablet 2-3 times daily for anxiety. Try to minimize use and skip doses when possible. 90 tablet 2  . amitriptyline (ELAVIL) 50 MG tablet Take 1 tablet (50 mg total) by mouth at bedtime. 60 tablet 6  . aspirin EC 81 MG tablet Take 81 mg by mouth daily.    . B Complex-C (B-COMPLEX WITH VITAMIN C) tablet Take 1 tablet by mouth daily.    . Cholecalciferol (VITAMIN D3) 5000 units TABS Take 5,000 Units by mouth daily.    . cyclobenzaprine (FLEXERIL) 10 MG tablet Take 1 tablet (10 mg total) by mouth 3 (three) times daily as needed for muscle spasms. 30 tablet 0  . diclofenac (VOLTAREN) 75 MG EC tablet     . gabapentin (NEURONTIN) 300 MG capsule     . lovastatin (MEVACOR) 20 MG  tablet Take 1 tablet (20 mg total) by mouth at bedtime. 90 tablet 3  . Multiple Vitamins-Minerals (MULTIVITAMIN ADULTS 50+) TABS Take 1 tablet by mouth daily.    Marland Kitchen oxyCODONE-acetaminophen (PERCOCET/ROXICET) 5-325 MG tablet     . Thiamine HCl (VITAMIN B-1 PO) Take 1 tablet by mouth daily.      No current facility-administered medications for this visit.     REVIEW OF SYSTEMS:   [X]  denotes positive finding, [ ]  denotes negative finding Cardiac  Comments:  Chest pain or chest pressure:    Shortness of breath upon exertion:    Short of breath when lying flat:    Irregular heart rhythm:        Vascular    Pain in calf, thigh, or hip brought on by ambulation: x   Pain in feet at night that wakes you up from your sleep:  x   Blood clot in your veins:    Leg swelling:         Pulmonary    Oxygen at home:    Productive cough:     Wheezing:         Neurologic    Sudden weakness in arms or legs:  x     Sudden numbness in arms or legs:  x   Sudden onset of difficulty speaking or slurred speech:    Temporary loss of vision in one eye:     Problems with dizziness:         Gastrointestinal    Blood in stool:      Vomited blood:         Genitourinary    Burning when urinating:     Blood in urine:        Psychiatric    Major depression:  x       Hematologic    Bleeding problems:    Problems with blood clotting too easily: x       Skin    Rashes or ulcers:        Constitutional    Fever or chills:     PHYSICAL EXAM:   Vitals:   06/01/16 1253 06/01/16 1255  BP: (!) 151/83 (!) 148/80  Pulse: 76 76  Resp: 16   Temp: 97 F (36.1 C)   TempSrc: Oral   SpO2: 98%   Weight: 133 lb (60.3 kg)   Height: 5\' 9"  (1.753 m)     GENERAL: The patient is a well-nourished male, in no acute distress. The vital signs are documented above. CARDIAC: There is a regular rate and rhythm.  VASCULAR: palpable left dorsalis pedis pulse, nonpalpable right PULMONARY: Nonlabored respirations ABDOMEN: Soft and non-tender with normal pitched bowel sounds.  MUSCULOSKELETAL: There are no major deformities or cyanosis. NEUROLOGIC: No focal weakness or paresthesias are detected. SKIN: There are no ulcers or rashes noted. PSYCHIATRIC: The patient has a normal affect.  STUDIES:   Carotid angio:  Bilateral carotid occlusion ABI:  R 0.44    L 0.99 R LE Duplex:  Possible R SFA occlusion  ASSESSMENT and PLAN   I suspect the patient's leg pain is multifactorial.  He does not give a classic description of vascular insufficiency as the source of his pain.  Since most of it is a shooting type pain, this is most likely neurogenic.  However, his right leg is significantly worse than his left, and that is the leg with significantly decreased blood flow.  I discussed with the patient that we could consider  performing angiography to define the true extent of the disease in his right leg and to intervene if  possible.  I told him that even with successful revascularization this may not alleviate his symptoms, however if I can improve circulation, it was help guide future decisions regarding the etiology of his pain.  I have scheduled him for angiography via a left femoral approach in interventional the right leg if feasible on Tuesday, May 15.  We discussed the risks and benefits of the procedure including the risk of distal embolization, the need for emergent surgery and limb loss.  All his questions were answered and he wishes to proceed.   [name], MD [facility] [phone] Pager [phone]

## 2016-06-09 NOTE — Op Note (Signed)
Patient name: Roy Moore MRN: [id number] DOB: 05-Dec-1955 Sex: male  06/09/2016 Pre-operative Diagnosis: right leg claudication Post-operative diagnosis:  Same Surgeon:  [name] Procedure Performed:  1.  Ultrasound-guided access, left femoral artery  2.  Abdominal aortogram  3.  Right lower extremity runoff  4.  Stent, right superficial femoral artery  5.  Conscious sedation (56 minutes)    Indications:  The patient has a history of right leg pain.  It is uncertain whether or not this is neurogenic of vasculogenic.  He did have a ultrasound that showed an occluded right superficial femoral artery, therefore he comes in today for further evaluation and possible intervention.  Procedure:  The patient was identified in the holding area and taken to room 8.  The patient was then placed supine on the table and prepped and draped in the usual sterile fashion.  A time out was called.  Conscious sedation was administered with the use of IV fentanyl and Versed in a continuous physician and nurse monitoring.  Heart rate, blood pressure, and oxygen saturation continuously monitored.  Ultrasound was used to evaluate the left common femoral artery.  It was patent .  A digital ultrasound image was acquired.  A micropuncture needle was used to access the left common femoral artery under ultrasound guidance.  An 018 wire was advanced without resistance and a micropuncture sheath was placed.  The 018 wire was removed and a benson wire was placed.  The micropuncture sheath was exchanged for a 5 french sheath.  An omniflush catheter was advanced over the wire to the level of L-1.  An abdominal angiogram was obtained.  Next, using the omniflush catheter and a benson wire, the aortic bifurcation was crossed and the catheter was placed into theright external iliac artery and right runoff was obtained.   Findings:   Aortogram:  No significant renal artery stenosis.  The infrarenal abdominal aorta is  widely patent.  Bilateral common and external iliac arteries are widely patent  Right Lower Extremity:  The right common femoral and profunda femoral artery are patent throughout the course.  The superficial femoral artery is patent proximally however in the mid thigh, it occludes for distance of approximately 8 cm with reconstitution of the distal superficial femoral artery.  The popliteal artery is patent throughout it's course.  There is luminal irregularity in the distal below-knee popliteal artery.  There is three-vessel runoff  Left Lower Extremity:  Not evaluated  Intervention:  After the above images were acquired the decision was made to proceed with intervention.  A 7 French 45 cm sheath was advanced into the right external iliac artery.  The patient was fully heparinized.  Using a Glidewire and a quick cross catheter, the superficial femoral artery was selected.  With the Glidewire and quick cross catheter, the lesion was successfully crossed on the initial pass.  A contrast injection was performed with the catheter in the popliteal artery, confirming successful crossing of the lesion.  A Rosen wire was inserted.  I elected to primarily stent this area.  I selected a 7 x 100 INNOVA stent and deployed it across the lesion.  This was postdilated with a 6 x 100 balloon.  There was an area in the proximal stent that didn't fully opened and so therefore I used a 6 x 20 balloon to redilate these areas.  Runoff imaging was repeated and this showed no change from pre-intervention.  At this point, catheters and wires were removed.  The sheath was withdrawn to the left external iliac artery.  The patient was brought back to the holding area for sheath pull his coagulation profile corrects  Impression:  #1  right superficial femoral artery occlusion, successfully crossed and primarily stented using a 7 x 100 self-expanding stent.  Residual stenosis was less than 10%.   Theotis Burrow, M.D. [facility] [place] Office: [phone] Pager:  [phone]

## 2016-06-09 NOTE — Discharge Instructions (Signed)
Femoral Site Care °Refer to this sheet in the next few weeks. These instructions provide you with information about caring for yourself after your procedure. Your health care provider may also give you more specific instructions. Your treatment has been planned according to current medical practices, but problems sometimes occur. Call your health care provider if you have any problems or questions after your procedure. °What can I expect after the procedure? °After your procedure, it is typical to have the following: °· Bruising at the site that usually fades within 1-2 weeks. °· Blood collecting in the tissue (hematoma) that may be painful to the touch. It should usually decrease in size and tenderness within 1-2 weeks. °Follow these instructions at home: °· Take medicines only as directed by your health care provider. °· You may shower 24-48 hours after the procedure or as directed by your health care provider. Remove the bandage (dressing) and gently wash the site with plain soap and water. Pat the area dry with a clean towel. Do not rub the site, because this may cause bleeding. °· Do not take baths, swim, or use a hot tub until your health care provider approves. °· Check your insertion site every day for redness, swelling, or drainage. °· Do not apply powder or lotion to the site. °· Limit use of stairs to twice a day for the first 2-3 days or as directed by your health care provider. °· Do not squat for the first 2-3 days or as directed by your health care provider. °· Do not lift over 10 lb (4.5 kg) for 5 days after your procedure or as directed by your health care provider. °· Ask your health care provider when it is okay to: °¨ Return to work or school. °¨ Resume usual physical activities or sports. °¨ Resume sexual activity. °· Do not drive home if you are discharged the same day as the procedure. Have someone else drive you. °· You may drive 24 hours after the procedure unless otherwise instructed by  your health care provider. °· Do not operate machinery or power tools for 24 hours after the procedure or as directed by your health care provider. °· If your procedure was done as an outpatient procedure, which means that you went home the same day as your procedure, a responsible adult should be with you for the first 24 hours after you arrive home. °· Keep all follow-up visits as directed by your health care provider. This is important. °Contact a health care provider if: °· You have a fever. °· You have chills. °· You have increased bleeding from the site. Hold pressure on the site. °Get help right away if: °· You have unusual pain at the site. °· You have redness, warmth, or swelling at the site. °· You have drainage (other than a small amount of blood on the dressing) from the site. °· The site is bleeding, and the bleeding does not stop after 30 minutes of holding steady pressure on the site. °· Your leg or foot becomes pale, cool, tingly, or numb. °This information is not intended to replace advice given to you by your health care provider. Make sure you discuss any questions you have with your health care provider. °Document Released: 09/15/2013 Document Revised: 06/20/2015 Document Reviewed: 08/01/2013 °Elsevier Interactive Patient Education © 2017 Elsevier Inc. ° °

## 2016-06-10 ENCOUNTER — Encounter (HOSPITAL_COMMUNITY): Payer: Self-pay | Admitting: Surgery

## 2016-06-10 ENCOUNTER — Telehealth: Payer: Self-pay | Admitting: Surgery

## 2016-06-10 MED FILL — Morphine Sulfate Inj 4 MG/ML: INTRAMUSCULAR | Qty: 1 | Status: AC

## 2016-06-10 NOTE — Telephone Encounter (Signed)
Sched labs 07/09/16 at 3:00 and MD 07/13/16 at 1:15. Spoke to pt to confirm appts and mailed appt letters as per pt's request.

## 2016-06-10 NOTE — Telephone Encounter (Signed)
-----   Message from Mena Goes, RN sent at 06/10/2016  9:37 AM EDT ----- Regarding: schedule   ----- Message ----- From: Serafina Mitchell, MD Sent: 06/09/2016  12:13 PM To: [facility] Charge Pool  06/09/2016:  Follow-up 46 weeks with a duplex and ABIs of the right leg and a visit to see me

## 2016-06-17 ENCOUNTER — Telehealth: Payer: Self-pay | Admitting: *Deleted

## 2016-06-17 ENCOUNTER — Encounter: Payer: Self-pay | Admitting: Family

## 2016-06-17 NOTE — Telephone Encounter (Signed)
Received call from Roy Moore stating patient did not have an appetite at all and continues losing weight and that he is "freezing" all the time.  He continues having leg pain as well.  Denies any fever.  I explained with the symptoms he was experiencing that seeing his PCP would be advised.  Roy Moore states that they will see Dr Nyoka Cowden today and will call me with an update.

## 2016-06-18 ENCOUNTER — Ambulatory Visit (INDEPENDENT_AMBULATORY_CARE_PROVIDER_SITE_OTHER): Payer: Self-pay | Admitting: Family

## 2016-06-18 ENCOUNTER — Encounter: Payer: Self-pay | Admitting: Family

## 2016-06-18 ENCOUNTER — Ambulatory Visit (HOSPITAL_COMMUNITY)
Admission: RE | Admit: 2016-06-18 | Discharge: 2016-06-18 | Disposition: A | Discharge status: Home / Self Care | Payer: Medicare HMO | Source: Ambulatory Visit | Attending: Family | Admitting: Family

## 2016-06-18 VITALS — BP 141/81 | HR 90 | Temp 98.0°F | Resp 24 | Ht 69.0 in | Wt 134.0 lb

## 2016-06-18 DIAGNOSIS — Z959 Presence of cardiac and vascular implant and graft, unspecified: Secondary | ICD-10-CM | POA: Diagnosis not present

## 2016-06-18 DIAGNOSIS — I739 Peripheral vascular disease, unspecified: Secondary | ICD-10-CM

## 2016-06-18 DIAGNOSIS — I70201 Unspecified atherosclerosis of native arteries of extremities, right leg: Secondary | ICD-10-CM

## 2016-06-18 NOTE — Patient Instructions (Signed)
Peripheral Vascular Disease Peripheral vascular disease (PVD) is a disease of the blood vessels that are not part of your heart and brain. A simple term for PVD is poor circulation. In most cases, PVD narrows the blood vessels that carry blood from your heart to the rest of your body. This can result in a decreased supply of blood to your arms, legs, and internal organs, like your stomach or kidneys. However, it most often affects a person's lower legs and feet. There are two types of PVD.  Organic PVD. This is the more common type. It is caused by damage to the structure of blood vessels.  Functional PVD. This is caused by conditions that make blood vessels contract and tighten (spasm). Without treatment, PVD tends to get worse over time. PVD can also lead to acute ischemic limb. This is when an arm or limb suddenly has trouble getting enough blood. This is a medical emergency. Follow these instructions at home:  Take medicines only as told by your doctor.  Do not use any tobacco products, including cigarettes, chewing tobacco, or electronic cigarettes. If you need help quitting, ask your doctor.  Lose weight if you are overweight, and maintain a healthy weight as told by your doctor.  Eat a diet that is low in fat and cholesterol. If you need help, ask your doctor.  Exercise regularly. Ask your doctor for some good activities for you.  Take good care of your feet.  Wear comfortable shoes that fit well.  Check your feet often for any cuts or sores. Contact a doctor if:  You have cramps in your legs while walking.  You have leg pain when you are at rest.  You have coldness in a leg or foot.  Your skin changes.  You are unable to get or have an erection (erectile dysfunction).  You have cuts or sores on your feet that are not healing. Get help right away if:  Your arm or leg turns cold and blue.  Your arms or legs become red, warm, swollen, painful, or numb.  You have  chest pain or trouble breathing.  You suddenly have weakness in your face, arm, or leg.  You become very confused or you cannot speak.  You suddenly have a very bad headache.  You suddenly cannot see. This information is not intended to replace advice given to you by your health care provider. Make sure you discuss any questions you have with your health care provider. Document Released: 04/08/2009 Document Revised: 06/20/2015 Document Reviewed: 06/22/2013 Elsevier Interactive Patient Education  2017 Elsevier Inc.      Steps to Quit Smoking Smoking tobacco can be bad for your health. It can also affect almost every organ in your body. Smoking puts you and people around you at risk for many serious long-lasting (chronic) diseases. Quitting smoking is hard, but it is one of the best things that you can do for your health. It is never too late to quit. What are the benefits of quitting smoking? When you quit smoking, you lower your risk for getting serious diseases and conditions. They can include:  Lung cancer or lung disease.  Heart disease.  Stroke.  Heart attack.  Not being able to have children (infertility).  Weak bones (osteoporosis) and broken bones (fractures). If you have coughing, wheezing, and shortness of breath, those symptoms may get better when you quit. You may also get sick less often. If you are pregnant, quitting smoking can help to lower your chances   of having a baby of low birth weight. What can I do to help me quit smoking? Talk with your doctor about what can help you quit smoking. Some things you can do (strategies) include:  Quitting smoking totally, instead of slowly cutting back how much you smoke over a period of time.  Going to in-person counseling. You are more likely to quit if you go to many counseling sessions.  Using resources and support systems, such as:  Online chats with a counselor.  Phone quitlines.  Printed self-help  materials.  Support groups or group counseling.  Text messaging programs.  Mobile phone apps or applications.  Taking medicines. Some of these medicines may have nicotine in them. If you are pregnant or breastfeeding, do not take any medicines to quit smoking unless your doctor says it is okay. Talk with your doctor about counseling or other things that can help you. Talk with your doctor about using more than one strategy at the same time, such as taking medicines while you are also going to in-person counseling. This can help make quitting easier. What things can I do to make it easier to quit? Quitting smoking might feel very hard at first, but there is a lot that you can do to make it easier. Take these steps:  Talk to your family and friends. Ask them to support and encourage you.  Call phone quitlines, reach out to support groups, or work with a counselor.  Ask people who smoke to not smoke around you.  Avoid places that make you want (trigger) to smoke, such as:  Bars.  Parties.  Smoke-break areas at work.  Spend time with people who do not smoke.  Lower the stress in your life. Stress can make you want to smoke. Try these things to help your stress:  Getting regular exercise.  Deep-breathing exercises.  Yoga.  Meditating.  Doing a body scan. To do this, close your eyes, focus on one area of your body at a time from head to toe, and notice which parts of your body are tense. Try to relax the muscles in those areas.  Download or buy apps on your mobile phone or tablet that can help you stick to your quit plan. There are many free apps, such as QuitGuide from the CDC (Centers for Disease Control and Prevention). You can find more support from smokefree.gov and other websites. This information is not intended to replace advice given to you by your health care provider. Make sure you discuss any questions you have with your health care provider. Document Released:  11/08/2008 Document Revised: 09/10/2015 Document Reviewed: 05/29/2014 Elsevier Interactive Patient Education  2017 Elsevier Inc.  

## 2016-06-18 NOTE — Progress Notes (Signed)
Postoperative Visit   History of Present Illness  Roy Moore is a 61 y.o. male who is s/p right superficial femoral artery occlusion, successfully crossed and primarily stented using a 7 x 100 self-expanding stent on 06-09-16 by Dr. Trula Slade.  Residual stenosis was less than 10%. The patient has a history of right leg pain.  It was uncertain whether or not this is neurogenic of vasculogenic.  He did have a ultrasound that showed an occluded right superficial femoral artery.  He returns today with c/o nausea, weight loss, for 2-3 months; right foot pain since November 2017.  Wife states his PCP office advised him to be evaluated here.  He does have a history of lumbar laminectomy in November 2017.  Wife states he has lost 40 pounds in 2-3 months, pt denies post prandial abdominal pain, wife and pt states he has been nauseated for 2-3 months.    Pt states he does not have DM. He admits to smoking less than 1 ppd, started smoking at age 73 years.   Left groin access site for the arteriogram on 06-09-16 has no swelling nor tenderness.  The patient is able to complete their activities of daily living.     For VQI Use Only  PRE-ADM LIVING: Home  AMB STATUS: Ambulatory   Past Medical History:  Diagnosis Date  . Anxiety   . Hyperlipidemia   . Stroke [facility]) 2009   no residual    Past Surgical History:  Procedure Laterality Date  . ABDOMINAL AORTOGRAM W/LOWER EXTREMITY [name] 06/09/2016   Procedure: Abdominal Aortogram w/Lower Extremity;  Surgeon: Serafina Mitchell, MD;  Location: [facility];  Service: Cardiovascular;  Laterality: [name];  . BACK SURGERY     10 yrs ago or so-fusion in neck   . CERVICAL FUSION    . COLONOSCOPY     15-20 yrs ago-normal per pt.   . COLONOSCOPY W/ POLYPECTOMY    . CYSTOSCOPY     kidney stools removed   . LUMBAR LAMINECTOMY/DECOMPRESSION MICRODISCECTOMY Bilateral 12/03/2015   Procedure: Microdiscectomy - bilateral - Lumbar four-lumbar five;   Surgeon: Earnie Larsson, MD;  Location: [facility];  Service: Neurosurgery;  Laterality: Bilateral;  . PERIPHERAL VASCULAR INTERVENTION Right 06/09/2016   Procedure: Peripheral Vascular Intervention;  Surgeon: Serafina Mitchell, MD;  Location: [facility];  Service: Cardiovascular;  Laterality: Right;  SFA  . teeth removal     Social History   Social History  . Marital status: Married    Spouse name: [name]  . Number of children: [name]  . Years of education: [name]   Occupational History  . Not on file.   Social History Main Topics  . Smoking status: Current Every Day Smoker    Packs/day: 1.00    Years: 47.00    Types: Cigarettes  . Smokeless tobacco: Never Used  . Alcohol use No     Comment: no alcohol in 23 years (12/02/15  . Drug use: No  . Sexual activity: Not on file   Other Topics Concern  . Not on file   Social History Narrative  . No narrative on file    No Known Allergies  Current Outpatient Prescriptions on File Prior to Visit  Medication Sig Dispense Refill  . ALPRAZolam (XANAX) 1 MG tablet Take 1/2-1 tablet 2-3 times daily for anxiety. Try to minimize use and skip doses when possible. (Patient taking differently: Take 0.5-1 mg by mouth 3 (three) times daily as needed for  anxiety. ) 90 tablet 2  . amitriptyline (ELAVIL) 50 MG tablet Take 1 tablet (50 mg total) by mouth at bedtime. 60 tablet 6  . aspirin EC 81 MG tablet Take 81 mg by mouth daily.    . B Complex-C (B-COMPLEX WITH VITAMIN C) tablet Take 1 tablet by mouth daily.    . clopidogrel (PLAVIX) 75 MG tablet Take 1 tablet (75 mg total) by mouth daily. 30 tablet 11  . cyclobenzaprine (FLEXERIL) 10 MG tablet Take 1 tablet (10 mg total) by mouth 3 (three) times daily as needed for muscle spasms. 30 tablet 0  . diclofenac (VOLTAREN) 75 MG EC tablet Take 75 mg by mouth 2 (two) times daily as needed for mild pain.     Marland Kitchen lovastatin (MEVACOR) 20 MG tablet Take 1 tablet (20 mg total) by mouth at bedtime. 90 tablet 3  .  oxyCODONE-acetaminophen (PERCOCET/ROXICET) 5-325 MG tablet Take 1 tablet by mouth 2 (two) times daily as needed for moderate pain.     . Cholecalciferol (VITAMIN D3) 5000 units TABS Take 5,000 Units by mouth daily.    . Thiamine HCl (VITAMIN B-1 PO) Take 1 tablet by mouth daily.      No current facility-administered medications on file prior to visit.      Physical Examination  Vitals:   06/18/16 1414 06/18/16 1425  BP: (!) 141/78 (!) 141/81  Pulse: 90   Resp: (!) 24   Temp: 98 F (36.7 C)   TempSrc: Oral   SpO2: 99%   Weight: 134 lb (60.8 kg)   Height: 5\' 9"  (1.753 m)    Body mass index is 19.79 kg/m.  PHYSICAL EXAMINATION: General: The patient appears their stated age. Thin male.  HEENT:  No gross abnormalities Pulmonary: Respirations are non-labored, CTAB with limited air movement in all fields.  Abdomen: Soft and non-tender with normal pitched bowel sounds.  Musculoskeletal: There are no major deformities.   Neurologic: No focal weakness or paresthesias are detected, Skin: There are no ulcer or rashes noted. Psychiatric: The patient has normal affect. Cardiovascular: There is a regular rate and rhythm without significant murmur appreciated. No signs of ischemia in his feet. No peripheral edema.   Vascular: Vessel Right Left  Radial Palpable Palpable  Brachial Palpable Palpable  Aorta Not palpable [name]  Femoral 3+Palpable 3+Palpable  Popliteal 1+ palpable Not palpable  PT  + brisk Doppler signal  + brisk Doppler signal  DP + brisk Doppler signal  + brisk Doppler signal  Peroneal + Doppler signal + Doppler signal    Medical Decision Making  NOBORU BIDINGER is a 61 y.o. male who presents s/p right superficial femoral artery occlusion, successfully crossed and primarily stented using a 7 x 100 self-expanding stent on 06-09-16 by Dr. Trula Slade.  Residual stenosis was less than 10%. I discussed with Dr. Oneida Alar pt HPI, + Doppler signals in both feet PT, DP, and peroneal  arteries. Will obtain arterial duplex of right leg now to evaluate patency of stent.  Dr. Oneida Alar spoke with pt and wife.   The patient was counseled re smoking cessation and given several free resources re smoking cessation.   (06/18/16) Right LE Arterial Duplex: No Flow was noted in the noted proximal to or within the right SFA stent. Flow was noted in the right distal SFA into the popliteal and the tibial vessels.  No previous post interventional ultrasound exam results available for comparison.  Discussed with Dr. Oneida Alar results of occluded right SFA stent with  collateral flow noted into the popliteal and tibial vessels.  Follow up with Dr. Trula Slade in 2-3 weeks to discuss options to address occluded stent.    Thank you for allowing [place] to participate in this patient's care.  Jamario Colina, Sharmon Leyden, RN, MSN, FNP-C [facility] [place] Office: [phone]  06/18/2016, 2:34 PM  Clinic MD: Oneida Alar

## 2016-07-02 ENCOUNTER — Encounter: Payer: Self-pay | Admitting: Surgery

## 2016-07-07 ENCOUNTER — Other Ambulatory Visit: Payer: Self-pay

## 2016-07-07 DIAGNOSIS — I739 Peripheral vascular disease, unspecified: Secondary | ICD-10-CM

## 2016-07-08 ENCOUNTER — Encounter: Payer: Self-pay | Admitting: Surgery

## 2016-07-08 ENCOUNTER — Ambulatory Visit: Payer: Medicare HMO | Admitting: Surgery

## 2016-07-08 ENCOUNTER — Ambulatory Visit (HOSPITAL_COMMUNITY)
Admission: RE | Admit: 2016-07-08 | Discharge: 2016-07-08 | Disposition: A | Discharge status: Home / Self Care | Payer: Medicare HMO | Source: Ambulatory Visit | Attending: Surgery | Admitting: Surgery

## 2016-07-08 ENCOUNTER — Ambulatory Visit (INDEPENDENT_AMBULATORY_CARE_PROVIDER_SITE_OTHER): Payer: Medicare HMO | Admitting: Surgery

## 2016-07-08 VITALS — BP 159/82 | HR 79 | Temp 97.6°F | Resp 20 | Ht 69.0 in | Wt 134.0 lb

## 2016-07-08 DIAGNOSIS — I739 Peripheral vascular disease, unspecified: Secondary | ICD-10-CM | POA: Insufficient documentation

## 2016-07-08 DIAGNOSIS — I70211 Atherosclerosis of native arteries of extremities with intermittent claudication, right leg: Secondary | ICD-10-CM | POA: Diagnosis not present

## 2016-07-08 MED ORDER — CYCLOBENZAPRINE HCL 10 MG PO TABS: 10.0000 mg | ORAL_TABLET | Freq: Three times a day (TID) | ORAL | 5 refills | Status: DC

## 2016-07-08 NOTE — Progress Notes (Signed)
[facility]   Patient name: Roy Moore MRN: [id number] DOB: Dec 31, 1955 Sex: male   REASON FOR VISIT:    Follow up  HISOTRY OF PRESENT ILLNESS:    Roy Moore is a 61 y.o. male returns today for follow-up.  I initially evaluated him in May 2018 for leg pain.  He has been dealing with chronic pain in both legs.  The right leg is worse than the left.  He will wake up in the middle of the night with pain.  He could also have pain during the day.  Sometimes there are no aggravating factors.  He describes the pain as beginning in his feet and shooting upwards towards his knee.  He is without open wounds.  He is a heavy smoker.  I felt that his pain could easily be secondary to neuropathic issues rather than vascular disease, however he didn't for had significant vascular disease.  Therefore on 06/09/2016, he underwent angiography.  This revealed an occluded right superficial femoral artery.  This was able to be primarily stented.  He comes back in today and his stent is occluded.  His ABI 0.3.  He is having continued pain.  He does not report any improvement of his pain after stenting.  He does not know when the stent clogged up.   The patient reports a 30 pound weight loss over the course of the past 6 months.  He is a current smoker.  He takes a statin for hypercholesterolemia.  He does have a history of stroke.  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  PAST MEDICAL HISTORY:   Past Medical History:  Diagnosis Date  . Anxiety   . Hyperlipidemia   . Stroke [facility]) 2009   no residual     FAMILY HISTORY:   Family History  Problem Relation Age of Onset  . Colon cancer Neg Hx   . Rectal cancer Neg Hx   . Stomach cancer Neg Hx   . Esophageal cancer Neg Hx     SOCIAL HISTORY:   Social History  Substance Use Topics  . Smoking status: Current Every Day Smoker    Packs/day:  1.00    Years: 47.00    Types: Cigarettes  . Smokeless tobacco: Never Used  . Alcohol use No     Comment: no alcohol in 23 years (12/02/15     ALLERGIES:   No Known Allergies   CURRENT MEDICATIONS:   Current Outpatient Prescriptions  Medication Sig Dispense Refill  . ALPRAZolam (XANAX) 1 MG tablet Take 1/2-1 tablet 2-3 times daily for anxiety. Try to minimize use and skip doses when possible. (Patient taking differently: Take 0.5-1 mg by mouth 3 (three) times daily as needed for anxiety. ) 90 tablet 2  . amitriptyline (ELAVIL) 50 MG tablet Take 1 tablet (50 mg total) by mouth at bedtime. 60 tablet 6  . aspirin EC 81 MG tablet Take 81 mg by mouth daily.    . clopidogrel (PLAVIX) 75 MG tablet Take 1 tablet (75 mg total) by mouth daily. 30 tablet 11  . diclofenac (VOLTAREN) 75 MG EC tablet Take 75 mg by mouth 2 (two) times daily as needed for mild pain.     . Thiamine HCl (VITAMIN B-1 PO) Take 1 tablet by mouth daily.     . B Complex-C (B-COMPLEX WITH VITAMIN C) tablet Take 1 tablet by mouth daily.    . Cholecalciferol (VITAMIN D3) 5000 units TABS Take 5,000  Units by mouth daily.    . cyclobenzaprine (FLEXERIL) 10 MG tablet Take 1 tablet (10 mg total) by mouth 3 (three) times daily as needed for muscle spasms. (Patient not taking: Reported on 07/08/2016) 30 tablet 0  . lovastatin (MEVACOR) 20 MG tablet Take 1 tablet (20 mg total) by mouth at bedtime. (Patient not taking: Reported on 07/08/2016) 90 tablet 3  . oxyCODONE-acetaminophen (PERCOCET/ROXICET) 5-325 MG tablet Take 1 tablet by mouth 2 (two) times daily as needed for moderate pain.      No current facility-administered medications for this visit.     REVIEW OF SYSTEMS:   [X]  denotes positive finding, [ ]  denotes negative finding Cardiac  Comments:  Chest pain or chest pressure:    Shortness of breath upon exertion:    Short of breath when lying flat:    Irregular heart rhythm:        Vascular    Pain in calf, thigh, or hip  brought on by ambulation: x   Pain in feet at night that wakes you up from your sleep:  x   Blood clot in your veins:    Leg swelling:         Pulmonary    Oxygen at home:    Productive cough:     Wheezing:         Neurologic    Sudden weakness in arms or legs:     Sudden numbness in arms or legs:     Sudden onset of difficulty speaking or slurred speech:    Temporary loss of vision in one eye:     Problems with dizziness:         Gastrointestinal    Blood in stool:     Vomited blood:         Genitourinary    Burning when urinating:     Blood in urine:        Psychiatric    Major depression:         Hematologic    Bleeding problems:    Problems with blood clotting too easily:        Skin    Rashes or ulcers:        Constitutional    Fever or chills:      PHYSICAL EXAM:   Vitals:   07/08/16 1227 07/08/16 1230  BP: (!) 156/86 (!) 159/82  Pulse: 79   Resp: 20   Temp: 97.6 F (36.4 C)   TempSrc: Oral   SpO2: 100%   Weight: 134 lb (60.8 kg)   Height: 5\' 9"  (1.753 m)     GENERAL: The patient is a well-nourished male, in no acute distress. The vital signs are documented above. CARDIAC: There is a regular rate and rhythm.  VASCULAR: Nonpalpable pedal pulse PULMONARY: Non-labored respirations MUSCULOSKELETAL: There are no major deformities or cyanosis. NEUROLOGIC: No focal weakness or paresthesias are detected. SKIN: There are no ulcers or rashes noted. PSYCHIATRIC: The patient has a normal affect.  STUDIES:   Duplex today shows an ABI of 0.3.  The stent is occluded.  MEDICAL ISSUES:   Continued leg pain.  The next step vascular perspective would be to proceed with femoral-popliteal bypass.  I have tentatively placed him on the schedule for Friday, June 29.  I will evaluate his vein in the operating room and use saphenous vein if it is adequate, otherwise he would need prosthetic.  He understands that this may not alleviate his symptoms especially if they  are neuropathic.  He does have significant vascular disease and therefore I do think this will make some improvement.  I am sending him back to Dr. Trenton Gammon for further evaluation from a neurosurgical perspective to see if there are any other explanation for his pain.    [name], MD [facility] [phone] Pager [phone]

## 2016-07-09 ENCOUNTER — Encounter (HOSPITAL_COMMUNITY): Payer: Medicare HMO

## 2016-07-13 ENCOUNTER — Telehealth: Payer: Self-pay | Admitting: Surgery

## 2016-07-13 ENCOUNTER — Encounter: Payer: Medicare HMO | Admitting: Surgery

## 2016-07-13 NOTE — Telephone Encounter (Signed)
Per the orders on the discharge sheet from 07/08/16 Dr.Brabham wanted the patient to see cardiology for pre-op clearance and to see Dr.Poole as well. I called and left a message for the patient to let him know that he is scheduled to see Dr.Berry on 07/14/16 at 7:40am for the pre-op clearance at the [facility] location of [facility]. Also, I called and spoke w/ Caryl Pina at [place] office/[facility] ph#[phone]. They have attempted to reach the patient several times to schedule an MRI and follow up with Dr.Poole. The patient has refused to schedule this. I left a message also for the patient regarding this and asking him to call them directly to schedule. awt

## 2016-07-14 ENCOUNTER — Ambulatory Visit: Payer: Medicare HMO | Admitting: Cardiovascular Disease

## 2016-07-17 ENCOUNTER — Ambulatory Visit (INDEPENDENT_AMBULATORY_CARE_PROVIDER_SITE_OTHER): Payer: Medicare HMO | Admitting: Cardiovascular Disease

## 2016-07-17 ENCOUNTER — Other Ambulatory Visit: Payer: Self-pay

## 2016-07-17 ENCOUNTER — Encounter: Payer: Self-pay | Admitting: Cardiovascular Disease

## 2016-07-17 VITALS — BP 150/78 | HR 81 | Ht 69.0 in | Wt 133.4 lb

## 2016-07-17 DIAGNOSIS — Z01818 Encounter for other preprocedural examination: Secondary | ICD-10-CM | POA: Diagnosis not present

## 2016-07-17 DIAGNOSIS — I739 Peripheral vascular disease, unspecified: Secondary | ICD-10-CM | POA: Insufficient documentation

## 2016-07-17 NOTE — Patient Instructions (Signed)
Medication Instructions: Your physician recommends that you continue on your current medications as directed. Please refer to the Current Medication list given to you today.   Testing/Procedures: Your physician has requested that you have a lexiscan myoview. For further information please visit [email or web address]. Please follow instruction sheet, as given.  Follow-Up: Your physician recommends that you schedule a follow-up appointment as needed with Dr. Berry.    

## 2016-07-17 NOTE — Assessment & Plan Note (Signed)
History of peripheral arterial disease on lovastatin followed by his PCP

## 2016-07-17 NOTE — Assessment & Plan Note (Signed)
History of continued tobacco abuse one pack per day with 45 pack years of tobacco abuse recalcitrant to risk factor modification.

## 2016-07-17 NOTE — Assessment & Plan Note (Signed)
History of peripheral arterial disease status post recent right SFA endovascular therapy by Dr. Trula Slade 06/09/16 which apparently has recently closed requiring femoropopliteal bypass grafting. He is referred here for cardiovascular clearance. He denies chest pain or shortness of breath but does have risk factors including 45 pack years of tobacco abuse, hyperlipidemia and family history. He is unable to exercise. We will obtain a pharmacologic Myoview stress test to risk stratify him.

## 2016-07-17 NOTE — Progress Notes (Signed)
07/17/2016 Roy Moore   1955-11-24  [id number]  Primary Physician [name], MD Primary Cardiologist: Lorretta Harp MD Renae Gloss  HPI:  Roy Moore is a 61 year old thin and frail-appearing married Caucasian male father of 2 children accompanied by his wife Roy Moore today. He was referred by Dr. Trula Slade for cardiovascular evaluation and preoperative clearance before femoropopliteal bypass grafting next week. He has no prior cardiac history. He does have 45-pack-year history of tobacco abuse currently smoking a pack per day and recalcitrant to risk factor modification along with treated hyperlipidemia. His mother did die of a myocardial infarction. He has had a remote stroke and has known carotid disease but has never had a myocardial infarction. He denies chest pain or shortness of breath. He recently had right SFA endovascular therapy by Dr. Trula Slade 06/09/16 which has subsequently occluded requiring elective femoropopliteal bypass grafting next week.   Current Outpatient Prescriptions  Medication Sig Dispense Refill  . ALPRAZolam (XANAX) 1 MG tablet Take 1/2-1 tablet 2-3 times daily for anxiety. Try to minimize use and skip doses when possible. (Patient taking differently: Take 0.5-1 mg by mouth 3 (three) times daily as needed for anxiety. ) 90 tablet 2  . amitriptyline (ELAVIL) 50 MG tablet Take 1 tablet (50 mg total) by mouth at bedtime. 60 tablet 6  . aspirin EC 81 MG tablet Take 81 mg by mouth daily.    . B Complex-C (B-COMPLEX WITH VITAMIN C) tablet Take 1 tablet by mouth daily.    . Cholecalciferol (VITAMIN D3) 5000 units TABS Take 5,000 Units by mouth daily.    . clopidogrel (PLAVIX) 75 MG tablet Take 1 tablet (75 mg total) by mouth daily. 30 tablet 11  . cyclobenzaprine (FLEXERIL) 10 MG tablet Take 1 tablet (10 mg total) by mouth 3 (three) times daily. 90 tablet 5  . diclofenac (VOLTAREN) 75 MG EC tablet Take 75 mg by mouth 2 (two) times daily as needed for  mild pain.     Marland Kitchen lovastatin (MEVACOR) 20 MG tablet Take 1 tablet (20 mg total) by mouth at bedtime. 90 tablet 3  . oxyCODONE-acetaminophen (PERCOCET/ROXICET) 5-325 MG tablet Take 1 tablet by mouth 2 (two) times daily as needed for moderate pain.     . Thiamine HCl (VITAMIN B-1 PO) Take 1 tablet by mouth daily.      No current facility-administered medications for this visit.     No Known Allergies  Social History   Social History  . Marital status: Married    Spouse name: [name]  . Number of children: [name]  . Years of education: [name]   Occupational History  . Not on file.   Social History Main Topics  . Smoking status: Current Every Day Smoker    Packs/day: 1.00    Years: 47.00    Types: Cigarettes  . Smokeless tobacco: Never Used  . Alcohol use No     Comment: no alcohol in 23 years (12/02/15  . Drug use: No  . Sexual activity: Not on file   Other Topics Concern  . Not on file   Social History Narrative  . No narrative on file     Review of Systems: General: negative for chills, fever, night sweats or weight changes.  Cardiovascular: negative for chest pain, dyspnea on exertion, edema, orthopnea, palpitations, paroxysmal nocturnal dyspnea or shortness of breath Dermatological: negative for rash Respiratory: negative for cough or wheezing Urologic: negative for hematuria Abdominal: negative for nausea, vomiting,  diarrhea, bright red blood per rectum, melena, or hematemesis Neurologic: negative for visual changes, syncope, or dizziness All other systems reviewed and are otherwise negative except as noted above.    Blood pressure (!) 150/78, pulse 81, height 5\' 9"  (1.753 m), weight 133 lb 6.4 oz (60.5 kg).  General appearance: alert and no distress Neck: no adenopathy, no carotid bruit, no JVD, supple, symmetrical, trachea midline and thyroid not enlarged, symmetric, no tenderness/mass/nodules Lungs: clear to auscultation bilaterally Heart: regular rate and rhythm, S1,  S2 normal, no murmur, click, rub or gallop Extremities: extremities normal, atraumatic, no cyanosis or edema  EKG Sinus rhythm at 81 without ST or T-wave changes. Personally reviewed this EKG  ASSESSMENT AND PLAN:   DYSLIPIDEMIA History of peripheral arterial disease on lovastatin followed by his PCP  TOBACCO ABUSE History of continued tobacco abuse one pack per day with 45 pack years of tobacco abuse recalcitrant to risk factor modification.  Peripheral arterial disease (HCC) History of peripheral arterial disease status post recent right SFA endovascular therapy by Dr. Trula Slade 06/09/16 which apparently has recently closed requiring femoropopliteal bypass grafting. He is referred here for cardiovascular clearance. He denies chest pain or shortness of breath but does have risk factors including 45 pack years of tobacco abuse, hyperlipidemia and family history. He is unable to exercise. We will obtain a pharmacologic Myoview stress test to risk stratify him.      Lorretta Harp MD FACP,FACC,FAHA, [facility] 07/17/2016 4:06 PM

## 2016-07-20 ENCOUNTER — Other Ambulatory Visit: Payer: Self-pay

## 2016-07-20 ENCOUNTER — Telehealth (HOSPITAL_COMMUNITY): Payer: Self-pay | Admitting: *Deleted

## 2016-07-20 NOTE — Telephone Encounter (Signed)
Left message on voicemail in reference to upcoming appointment scheduled for 07/21/16. Phone number given for a call back so details instructions can be given. Iren Whipp, Ranae Palms

## 2016-07-21 ENCOUNTER — Ambulatory Visit (HOSPITAL_BASED_OUTPATIENT_CLINIC_OR_DEPARTMENT_OTHER): Payer: Medicare HMO

## 2016-07-21 DIAGNOSIS — Z8673 Personal history of transient ischemic attack (TIA), and cerebral infarction without residual deficits: Secondary | ICD-10-CM | POA: Diagnosis not present

## 2016-07-21 DIAGNOSIS — M79604 Pain in right leg: Secondary | ICD-10-CM | POA: Diagnosis not present

## 2016-07-21 DIAGNOSIS — Z01818 Encounter for other preprocedural examination: Secondary | ICD-10-CM

## 2016-07-21 DIAGNOSIS — Z7982 Long term (current) use of aspirin: Secondary | ICD-10-CM | POA: Diagnosis not present

## 2016-07-21 DIAGNOSIS — E785 Hyperlipidemia, unspecified: Secondary | ICD-10-CM | POA: Diagnosis not present

## 2016-07-21 DIAGNOSIS — K219 Gastro-esophageal reflux disease without esophagitis: Secondary | ICD-10-CM | POA: Diagnosis not present

## 2016-07-21 DIAGNOSIS — E7800 Pure hypercholesterolemia, unspecified: Secondary | ICD-10-CM | POA: Diagnosis not present

## 2016-07-21 DIAGNOSIS — I252 Old myocardial infarction: Secondary | ICD-10-CM | POA: Insufficient documentation

## 2016-07-21 DIAGNOSIS — Z79891 Long term (current) use of opiate analgesic: Secondary | ICD-10-CM | POA: Diagnosis not present

## 2016-07-21 DIAGNOSIS — I70221 Atherosclerosis of native arteries of extremities with rest pain, right leg: Secondary | ICD-10-CM | POA: Diagnosis not present

## 2016-07-21 DIAGNOSIS — F17210 Nicotine dependence, cigarettes, uncomplicated: Secondary | ICD-10-CM | POA: Diagnosis not present

## 2016-07-21 DIAGNOSIS — R634 Abnormal weight loss: Secondary | ICD-10-CM | POA: Diagnosis not present

## 2016-07-21 DIAGNOSIS — I6523 Occlusion and stenosis of bilateral carotid arteries: Secondary | ICD-10-CM | POA: Diagnosis not present

## 2016-07-21 DIAGNOSIS — Z79899 Other long term (current) drug therapy: Secondary | ICD-10-CM | POA: Diagnosis not present

## 2016-07-21 DIAGNOSIS — G8929 Other chronic pain: Secondary | ICD-10-CM | POA: Diagnosis not present

## 2016-07-21 DIAGNOSIS — F419 Anxiety disorder, unspecified: Secondary | ICD-10-CM | POA: Diagnosis not present

## 2016-07-21 DIAGNOSIS — M79605 Pain in left leg: Secondary | ICD-10-CM | POA: Diagnosis not present

## 2016-07-21 LAB — MYOCARDIAL PERFUSION IMAGING
CHL CUP NUCLEAR SDS: 1
CHL CUP NUCLEAR SSS: 11
CHL CUP RESTING HR STRESS: 77 {beats}/min
CSEPPHR: 99 {beats}/min
LV dias vol: 113 mL (ref 62–150)
LV sys vol: 56 mL
NUC STRESS TID: 0.98
RATE: 0.26
SRS: 10

## 2016-07-21 MED ORDER — TECHNETIUM TC 99M TETROFOSMIN IV KIT
32.8000 | PACK | Freq: Once | INTRAVENOUS | Status: AC | PRN
  Administered 2016-07-21: 32.8 via INTRAVENOUS
  Filled 2016-07-21: qty 33

## 2016-07-21 MED ORDER — REGADENOSON 0.4 MG/5ML IV SOLN
0.4000 mg | Freq: Once | INTRAVENOUS | Status: AC
  Administered 2016-07-21: 0.4 mg via INTRAVENOUS

## 2016-07-21 MED ORDER — TECHNETIUM TC 99M TETROFOSMIN IV KIT
10.7000 | PACK | Freq: Once | INTRAVENOUS | Status: AC | PRN
  Administered 2016-07-21: 10.7 via INTRAVENOUS
  Filled 2016-07-21: qty 11

## 2016-07-23 ENCOUNTER — Encounter (HOSPITAL_COMMUNITY): Payer: Self-pay | Admitting: *Deleted

## 2016-07-23 ENCOUNTER — Encounter: Payer: Self-pay | Admitting: Cardiovascular Disease

## 2016-07-23 NOTE — Progress Notes (Signed)
Anesthesia Chart Review:  Pt is a same day work up.   Pt is a 61 year old male scheduled for R fem-pop bypass graft on 07/24/2016 with Harold Barban, MD  - PCP is [name], MD - Pt saw Quay Burow, MD with cardiology for pre-op eval 07/17/16. Stress test ordered, determined to be low risk, results below. Pt cleared for surgery.   PMH includes:  Stroke (2009), hyperlipidemia. Current smoker. BMI 20  Medications include: ASA 81 mg, Plavix, lovastatin  Labs will be obtained DOS.   CXR 04/12/16: No acute abnormality.  EKG 07/17/16: NSR  Nuclear stress test 07/21/16:   Nuclear stress EF: 51%.  No T wave inversion was noted during stress.  There was no ST segment deviation noted during stress.  Defect 1: There is a large defect of moderate severity.  Findings consistent with prior myocardial infarction.  This is a low risk study. - Large size, moderate severity fixed (SDS 1) inferior and inferoseptal perfusion defect, likely scar or possibly artifact. LVEF 51% with inferoseptal hypokinesis. This is a low risk study.  If labs acceptable DOS, I anticipate pt can proceed with surgery as scheduled.   Willeen Cass, FNP-BC [facility]/Anesthesiology Phone: [phone] 07/23/2016 12:56 PM

## 2016-07-23 NOTE — Progress Notes (Signed)
Roy Moore reports that he has not taken Plavix in 2 months.

## 2016-07-24 ENCOUNTER — Inpatient Hospital Stay (HOSPITAL_COMMUNITY): Payer: Medicare HMO | Admitting: Emergency Medicine

## 2016-07-24 ENCOUNTER — Inpatient Hospital Stay (HOSPITAL_COMMUNITY)
Admission: RE | Admit: 2016-07-24 | Discharge: 2016-07-26 | DRG: 254 | Disposition: A | Discharge status: Home / Self Care | Payer: Medicare HMO | Source: Ambulatory Visit | Attending: Surgery | Admitting: Surgery

## 2016-07-24 ENCOUNTER — Encounter (HOSPITAL_COMMUNITY)
Admission: RE | Disposition: A | Discharge status: Home / Self Care | Payer: Self-pay | Source: Ambulatory Visit | Attending: Surgery

## 2016-07-24 ENCOUNTER — Encounter (HOSPITAL_COMMUNITY): Payer: Self-pay

## 2016-07-24 DIAGNOSIS — E7800 Pure hypercholesterolemia, unspecified: Secondary | ICD-10-CM | POA: Diagnosis present

## 2016-07-24 DIAGNOSIS — R634 Abnormal weight loss: Secondary | ICD-10-CM | POA: Diagnosis present

## 2016-07-24 DIAGNOSIS — Z79899 Other long term (current) drug therapy: Secondary | ICD-10-CM | POA: Diagnosis not present

## 2016-07-24 DIAGNOSIS — F17210 Nicotine dependence, cigarettes, uncomplicated: Secondary | ICD-10-CM | POA: Diagnosis present

## 2016-07-24 DIAGNOSIS — E785 Hyperlipidemia, unspecified: Secondary | ICD-10-CM | POA: Diagnosis present

## 2016-07-24 DIAGNOSIS — Z79891 Long term (current) use of opiate analgesic: Secondary | ICD-10-CM

## 2016-07-24 DIAGNOSIS — M79605 Pain in left leg: Secondary | ICD-10-CM | POA: Diagnosis present

## 2016-07-24 DIAGNOSIS — Z7982 Long term (current) use of aspirin: Secondary | ICD-10-CM

## 2016-07-24 DIAGNOSIS — M79604 Pain in right leg: Secondary | ICD-10-CM | POA: Diagnosis present

## 2016-07-24 DIAGNOSIS — Z8673 Personal history of transient ischemic attack (TIA), and cerebral infarction without residual deficits: Secondary | ICD-10-CM | POA: Diagnosis not present

## 2016-07-24 DIAGNOSIS — G8929 Other chronic pain: Secondary | ICD-10-CM | POA: Diagnosis present

## 2016-07-24 DIAGNOSIS — I6523 Occlusion and stenosis of bilateral carotid arteries: Secondary | ICD-10-CM | POA: Diagnosis present

## 2016-07-24 DIAGNOSIS — I739 Peripheral vascular disease, unspecified: Secondary | ICD-10-CM | POA: Diagnosis present

## 2016-07-24 DIAGNOSIS — I70221 Atherosclerosis of native arteries of extremities with rest pain, right leg: Principal | ICD-10-CM | POA: Diagnosis present

## 2016-07-24 DIAGNOSIS — Z95828 Presence of other vascular implants and grafts: Secondary | ICD-10-CM

## 2016-07-24 HISTORY — DX: Adverse effect of unspecified anesthetic, initial encounter: T41.45XA

## 2016-07-24 HISTORY — DX: Other specified postprocedural states: Z98.890

## 2016-07-24 HISTORY — PX: FEMORAL-POPLITEAL BYPASS GRAFT: SHX937

## 2016-07-24 HISTORY — DX: Peripheral vascular disease, unspecified: I73.9

## 2016-07-24 HISTORY — DX: Nausea with vomiting, unspecified: R11.2

## 2016-07-24 LAB — URINALYSIS, ROUTINE W REFLEX MICROSCOPIC
BACTERIA UA: NONE SEEN
Bilirubin Urine: NEGATIVE
Glucose, UA: NEGATIVE mg/dL
KETONES UR: NEGATIVE mg/dL
Nitrite: NEGATIVE
PH: 6 (ref 5.0–8.0)
PROTEIN: NEGATIVE mg/dL
SQUAMOUS EPITHELIAL / LPF: NONE SEEN
Specific Gravity, Urine: 1.011 (ref 1.005–1.030)

## 2016-07-24 LAB — COMPREHENSIVE METABOLIC PANEL
ALT: 13 U/L — AB (ref 17–63)
AST: 15 U/L (ref 15–41)
Albumin: 2.8 g/dL — ABNORMAL LOW (ref 3.5–5.0)
Alkaline Phosphatase: 104 U/L (ref 38–126)
Anion gap: 9 (ref 5–15)
BUN: 14 mg/dL (ref 6–20)
CHLORIDE: 104 mmol/L (ref 101–111)
CO2: 22 mmol/L (ref 22–32)
CREATININE: 1.12 mg/dL (ref 0.61–1.24)
Calcium: 9.2 mg/dL (ref 8.9–10.3)
Glucose, Bld: 95 mg/dL (ref 65–99)
POTASSIUM: 4.1 mmol/L (ref 3.5–5.1)
Sodium: 135 mmol/L (ref 135–145)
Total Bilirubin: 0.5 mg/dL (ref 0.3–1.2)
Total Protein: 7.2 g/dL (ref 6.5–8.1)

## 2016-07-24 LAB — CBC
HCT: 32.2 % — ABNORMAL LOW (ref 39.0–52.0)
Hemoglobin: 10.1 g/dL — ABNORMAL LOW (ref 13.0–17.0)
MCH: 27.4 pg (ref 26.0–34.0)
MCHC: 31.4 g/dL (ref 30.0–36.0)
MCV: 87.3 fL (ref 78.0–100.0)
PLATELETS: 373 10*3/uL (ref 150–400)
RBC: 3.69 MIL/uL — ABNORMAL LOW (ref 4.22–5.81)
RDW: 15.5 % (ref 11.5–15.5)
WBC: 13.1 10*3/uL — ABNORMAL HIGH (ref 4.0–10.5)

## 2016-07-24 LAB — PROTIME-INR
INR: 1.03
PROTHROMBIN TIME: 13.5 s (ref 11.4–15.2)

## 2016-07-24 LAB — APTT: aPTT: 31 seconds (ref 24–36)

## 2016-07-24 LAB — TYPE AND SCREEN
ABO/RH(D): A POS
Antibody Screen: NEGATIVE

## 2016-07-24 LAB — ABO/RH: ABO/RH(D): A POS

## 2016-07-24 SURGERY — BYPASS GRAFT FEMORAL-POPLITEAL ARTERY
Anesthesia: General | Site: Leg Upper | Laterality: Right

## 2016-07-24 MED ORDER — MIDAZOLAM HCL 2 MG/2ML IJ SOLN
INTRAMUSCULAR | Status: AC
  Filled 2016-07-24: qty 2

## 2016-07-24 MED ORDER — GLYCOPYRROLATE 0.2 MG/ML IJ SOLN
INTRAMUSCULAR | Status: DC | PRN
  Administered 2016-07-24: 0.2 mg via INTRAVENOUS

## 2016-07-24 MED ORDER — DICLOFENAC SODIUM 75 MG PO TBEC
75.0000 mg | DELAYED_RELEASE_TABLET | Freq: Two times a day (BID) | ORAL | Status: DC | PRN
  Administered 2016-07-24: 75 mg via ORAL
  Filled 2016-07-24 (×2): qty 1

## 2016-07-24 MED ORDER — ACETAMINOPHEN 650 MG RE SUPP: 325.0000 mg | RECTAL | Status: DC | PRN

## 2016-07-24 MED ORDER — FENTANYL CITRATE (PF) 100 MCG/2ML IJ SOLN
INTRAMUSCULAR | Status: AC
  Administered 2016-07-24: 100 ug
  Filled 2016-07-24: qty 2

## 2016-07-24 MED ORDER — GUAIFENESIN-DM 100-10 MG/5ML PO SYRP: 15.0000 mL | ORAL_SOLUTION | ORAL | Status: DC | PRN

## 2016-07-24 MED ORDER — LABETALOL HCL 5 MG/ML IV SOLN: 10.0000 mg | INTRAVENOUS | Status: DC | PRN

## 2016-07-24 MED ORDER — METOPROLOL TARTRATE 5 MG/5ML IV SOLN: 2.0000 mg | INTRAVENOUS | Status: DC | PRN

## 2016-07-24 MED ORDER — POTASSIUM CHLORIDE CRYS ER 20 MEQ PO TBCR: 20.0000 meq | EXTENDED_RELEASE_TABLET | Freq: Every day | ORAL | Status: DC | PRN

## 2016-07-24 MED ORDER — CHLORHEXIDINE GLUCONATE 4 % EX LIQD: 60.0000 mL | Freq: Once | CUTANEOUS | Status: DC

## 2016-07-24 MED ORDER — PAPAVERINE HCL 30 MG/ML IJ SOLN
INTRAMUSCULAR | Status: AC
  Filled 2016-07-24: qty 2

## 2016-07-24 MED ORDER — PHENYLEPHRINE HCL 10 MG/ML IJ SOLN
INTRAMUSCULAR | Status: DC | PRN
  Administered 2016-07-24: 80 ug via INTRAVENOUS
  Administered 2016-07-24: 120 ug via INTRAVENOUS
  Administered 2016-07-24: 80 ug via INTRAVENOUS
  Administered 2016-07-24: 120 ug via INTRAVENOUS

## 2016-07-24 MED ORDER — ALBUMIN HUMAN 5 % IV SOLN
INTRAVENOUS | Status: DC | PRN
  Administered 2016-07-24: 14:00:00 via INTRAVENOUS

## 2016-07-24 MED ORDER — ASPIRIN EC 81 MG PO TBEC
81.0000 mg | DELAYED_RELEASE_TABLET | Freq: Every day | ORAL | Status: DC
  Administered 2016-07-25 – 2016-07-26 (×2): 81 mg via ORAL
  Filled 2016-07-24 (×2): qty 1

## 2016-07-24 MED ORDER — HYDRALAZINE HCL 20 MG/ML IJ SOLN: 5.0000 mg | INTRAMUSCULAR | Status: DC | PRN

## 2016-07-24 MED ORDER — ROCURONIUM BROMIDE 100 MG/10ML IV SOLN
INTRAVENOUS | Status: DC | PRN
  Administered 2016-07-24: 25 mg via INTRAVENOUS
  Administered 2016-07-24: 10 mg via INTRAVENOUS
  Administered 2016-07-24: 50 mg via INTRAVENOUS
  Administered 2016-07-24: 25 mg via INTRAVENOUS

## 2016-07-24 MED ORDER — LABETALOL HCL 5 MG/ML IV SOLN
INTRAVENOUS | Status: AC
  Filled 2016-07-24: qty 4

## 2016-07-24 MED ORDER — ARTIFICIAL TEARS OPHTHALMIC OINT
TOPICAL_OINTMENT | OPHTHALMIC | Status: DC | PRN
  Administered 2016-07-24: 1 via OPHTHALMIC

## 2016-07-24 MED ORDER — PROPOFOL 1000 MG/100ML IV EMUL
INTRAVENOUS | Status: AC
  Filled 2016-07-24: qty 100

## 2016-07-24 MED ORDER — DEXTROSE 5 % IV SOLN
1.5000 g | Freq: Two times a day (BID) | INTRAVENOUS | Status: AC
  Administered 2016-07-25 (×2): 1.5 g via INTRAVENOUS
  Filled 2016-07-24 (×2): qty 1.5

## 2016-07-24 MED ORDER — FENTANYL CITRATE (PF) 100 MCG/2ML IJ SOLN: 25.0000 ug | INTRAMUSCULAR | Status: DC | PRN

## 2016-07-24 MED ORDER — ALUM & MAG HYDROXIDE-SIMETH 200-200-20 MG/5ML PO SUSP: 15.0000 mL | ORAL | Status: DC | PRN

## 2016-07-24 MED ORDER — ONDANSETRON HCL 4 MG/2ML IJ SOLN: 4.0000 mg | Freq: Four times a day (QID) | INTRAMUSCULAR | Status: DC | PRN

## 2016-07-24 MED ORDER — DEXAMETHASONE SODIUM PHOSPHATE 10 MG/ML IJ SOLN
INTRAMUSCULAR | Status: DC | PRN
  Administered 2016-07-24: 10 mg via INTRAVENOUS

## 2016-07-24 MED ORDER — DOCUSATE SODIUM 100 MG PO CAPS
100.0000 mg | ORAL_CAPSULE | Freq: Every day | ORAL | Status: DC
  Filled 2016-07-24 (×2): qty 1

## 2016-07-24 MED ORDER — HEMOSTATIC AGENTS (NO CHARGE) OPTIME
TOPICAL | Status: DC | PRN
  Administered 2016-07-24 (×2): 1 via TOPICAL

## 2016-07-24 MED ORDER — SODIUM CHLORIDE 0.9 % IV SOLN
INTRAVENOUS | Status: DC | PRN
  Administered 2016-07-24: 500 mL

## 2016-07-24 MED ORDER — SUGAMMADEX SODIUM 200 MG/2ML IV SOLN
INTRAVENOUS | Status: AC
  Filled 2016-07-24: qty 2

## 2016-07-24 MED ORDER — ONDANSETRON HCL 4 MG/2ML IJ SOLN
INTRAMUSCULAR | Status: DC | PRN
  Administered 2016-07-24 (×2): 4 mg via INTRAVENOUS

## 2016-07-24 MED ORDER — SODIUM CHLORIDE 0.9 % IV SOLN: INTRAVENOUS | Status: DC

## 2016-07-24 MED ORDER — PHENYLEPHRINE 40 MCG/ML (10ML) SYRINGE FOR IV PUSH (FOR BLOOD PRESSURE SUPPORT)
PREFILLED_SYRINGE | INTRAVENOUS | Status: AC
  Filled 2016-07-24: qty 10

## 2016-07-24 MED ORDER — HYDROMORPHONE HCL 1 MG/ML IJ SOLN: 0.2500 mg | INTRAMUSCULAR | Status: DC | PRN

## 2016-07-24 MED ORDER — DEXAMETHASONE SODIUM PHOSPHATE 10 MG/ML IJ SOLN
INTRAMUSCULAR | Status: AC
  Filled 2016-07-24: qty 1

## 2016-07-24 MED ORDER — PROTAMINE SULFATE 10 MG/ML IV SOLN
INTRAVENOUS | Status: DC | PRN
  Administered 2016-07-24 (×2): 20 mg via INTRAVENOUS
  Administered 2016-07-24: 10 mg via INTRAVENOUS

## 2016-07-24 MED ORDER — PROPOFOL 500 MG/50ML IV EMUL
INTRAVENOUS | Status: DC | PRN
  Administered 2016-07-24: 25 ug/kg/min via INTRAVENOUS

## 2016-07-24 MED ORDER — MORPHINE SULFATE (PF) 2 MG/ML IV SOLN
1.0000 mg | INTRAVENOUS | Status: DC | PRN
  Administered 2016-07-25: 2 mg via INTRAVENOUS
  Filled 2016-07-24: qty 1

## 2016-07-24 MED ORDER — ONDANSETRON HCL 4 MG/2ML IJ SOLN
INTRAMUSCULAR | Status: AC
  Filled 2016-07-24: qty 2

## 2016-07-24 MED ORDER — OXYCODONE-ACETAMINOPHEN 5-325 MG PO TABS
1.0000 | ORAL_TABLET | ORAL | Status: DC | PRN
  Administered 2016-07-24 – 2016-07-26 (×4): 1 via ORAL
  Filled 2016-07-24 (×4): qty 1

## 2016-07-24 MED ORDER — ROCURONIUM BROMIDE 10 MG/ML (PF) SYRINGE
PREFILLED_SYRINGE | INTRAVENOUS | Status: AC
  Filled 2016-07-24: qty 5

## 2016-07-24 MED ORDER — PRAVASTATIN SODIUM 20 MG PO TABS
20.0000 mg | ORAL_TABLET | Freq: Every day | ORAL | Status: DC
  Administered 2016-07-25: 20 mg via ORAL
  Filled 2016-07-24: qty 1

## 2016-07-24 MED ORDER — ACETAMINOPHEN 325 MG PO TABS: 325.0000 mg | ORAL_TABLET | ORAL | Status: DC | PRN

## 2016-07-24 MED ORDER — BISACODYL 10 MG RE SUPP
10.0000 mg | Freq: Every day | RECTAL | Status: DC | PRN
Start: 2016-07-24 — End: 2016-07-26

## 2016-07-24 MED ORDER — MEPERIDINE HCL 25 MG/ML IJ SOLN: 6.2500 mg | INTRAMUSCULAR | Status: DC | PRN

## 2016-07-24 MED ORDER — SODIUM CHLORIDE 0.9 % IV SOLN
INTRAVENOUS | Status: DC
  Administered 2016-07-24: 21:00:00 via INTRAVENOUS

## 2016-07-24 MED ORDER — SODIUM CHLORIDE 0.9 % IV SOLN: 500.0000 mL | Freq: Once | INTRAVENOUS | Status: DC | PRN

## 2016-07-24 MED ORDER — HEPARIN SODIUM (PORCINE) 1000 UNIT/ML IJ SOLN
INTRAMUSCULAR | Status: AC
  Filled 2016-07-24: qty 1

## 2016-07-24 MED ORDER — PROMETHAZINE HCL 25 MG/ML IJ SOLN: 6.2500 mg | INTRAMUSCULAR | Status: DC | PRN

## 2016-07-24 MED ORDER — MIDAZOLAM HCL 2 MG/2ML IJ SOLN
INTRAMUSCULAR | Status: DC | PRN
  Administered 2016-07-24 (×2): 1 mg via INTRAVENOUS

## 2016-07-24 MED ORDER — PHENYLEPHRINE HCL 10 MG/ML IJ SOLN
INTRAVENOUS | Status: DC | PRN
  Administered 2016-07-24: 25 ug/min via INTRAVENOUS

## 2016-07-24 MED ORDER — PHENOL 1.4 % MT LIQD: 1.0000 | OROMUCOSAL | Status: DC | PRN

## 2016-07-24 MED ORDER — AMITRIPTYLINE HCL 50 MG PO TABS
50.0000 mg | ORAL_TABLET | Freq: Every day | ORAL | Status: DC
  Administered 2016-07-24 – 2016-07-25 (×2): 50 mg via ORAL
  Filled 2016-07-24: qty 1
  Filled 2016-07-24: qty 2

## 2016-07-24 MED ORDER — PROPOFOL 10 MG/ML IV BOLUS
INTRAVENOUS | Status: AC
  Filled 2016-07-24: qty 20

## 2016-07-24 MED ORDER — 0.9 % SODIUM CHLORIDE (POUR BTL) OPTIME
TOPICAL | Status: DC | PRN
  Administered 2016-07-24: 1000 mL
  Administered 2016-07-24: 2000 mL

## 2016-07-24 MED ORDER — PROTAMINE SULFATE 10 MG/ML IV SOLN
INTRAVENOUS | Status: AC
  Filled 2016-07-24: qty 5

## 2016-07-24 MED ORDER — ALPRAZOLAM 0.5 MG PO TABS
0.5000 mg | ORAL_TABLET | Freq: Three times a day (TID) | ORAL | Status: DC | PRN
  Administered 2016-07-25 – 2016-07-26 (×3): 1 mg via ORAL
  Filled 2016-07-24 (×3): qty 2

## 2016-07-24 MED ORDER — ARTIFICIAL TEARS OPHTHALMIC OINT
TOPICAL_OINTMENT | OPHTHALMIC | Status: AC
  Filled 2016-07-24: qty 3.5

## 2016-07-24 MED ORDER — PROPOFOL 10 MG/ML IV BOLUS
INTRAVENOUS | Status: DC | PRN
  Administered 2016-07-24: 170 mg via INTRAVENOUS

## 2016-07-24 MED ORDER — ENSURE ENLIVE PO LIQD: 237.0000 mL | Freq: Two times a day (BID) | ORAL | Status: DC

## 2016-07-24 MED ORDER — FENTANYL CITRATE (PF) 250 MCG/5ML IJ SOLN
INTRAMUSCULAR | Status: AC
  Filled 2016-07-24: qty 5

## 2016-07-24 MED ORDER — ENOXAPARIN SODIUM 30 MG/0.3ML ~~LOC~~ SOLN
30.0000 mg | SUBCUTANEOUS | Status: DC
  Administered 2016-07-25: 30 mg via SUBCUTANEOUS
  Filled 2016-07-24: qty 0.3

## 2016-07-24 MED ORDER — PANTOPRAZOLE SODIUM 40 MG PO TBEC
40.0000 mg | DELAYED_RELEASE_TABLET | Freq: Every day | ORAL | Status: DC
  Administered 2016-07-25: 40 mg via ORAL
  Filled 2016-07-24 (×2): qty 1

## 2016-07-24 MED ORDER — POLYETHYLENE GLYCOL 3350 17 G PO PACK: 17.0000 g | PACK | Freq: Every day | ORAL | Status: DC | PRN

## 2016-07-24 MED ORDER — LIDOCAINE 2% (20 MG/ML) 5 ML SYRINGE
INTRAMUSCULAR | Status: AC
  Filled 2016-07-24: qty 5

## 2016-07-24 MED ORDER — LIDOCAINE HCL (CARDIAC) 20 MG/ML IV SOLN
INTRAVENOUS | Status: DC | PRN
  Administered 2016-07-24: 100 mg via INTRATRACHEAL

## 2016-07-24 MED ORDER — FENTANYL CITRATE (PF) 250 MCG/5ML IJ SOLN
INTRAMUSCULAR | Status: DC | PRN
  Administered 2016-07-24: 50 ug via INTRAVENOUS
  Administered 2016-07-24: 150 ug via INTRAVENOUS
  Administered 2016-07-24: 50 ug via INTRAVENOUS

## 2016-07-24 MED ORDER — MAGNESIUM SULFATE 2 GM/50ML IV SOLN
2.0000 g | Freq: Every day | INTRAVENOUS | Status: DC | PRN
  Filled 2016-07-24: qty 50

## 2016-07-24 MED ORDER — LACTATED RINGERS IV SOLN
INTRAVENOUS | Status: DC
  Administered 2016-07-24 (×3): via INTRAVENOUS

## 2016-07-24 MED ORDER — LABETALOL HCL 5 MG/ML IV SOLN
INTRAVENOUS | Status: DC | PRN
  Administered 2016-07-24 (×4): 5 mg via INTRAVENOUS

## 2016-07-24 MED ORDER — HEPARIN SODIUM (PORCINE) 1000 UNIT/ML IJ SOLN
INTRAMUSCULAR | Status: DC | PRN
  Administered 2016-07-24: 2000 [IU] via INTRAVENOUS
  Administered 2016-07-24: 1000 [IU] via INTRAVENOUS
  Administered 2016-07-24: 6000 [IU] via INTRAVENOUS

## 2016-07-24 MED ORDER — DEXTROSE 5 % IV SOLN
1.5000 g | INTRAVENOUS | Status: AC
  Administered 2016-07-24: 1.5 g via INTRAVENOUS
  Filled 2016-07-24: qty 1.5

## 2016-07-24 MED ORDER — SUGAMMADEX SODIUM 200 MG/2ML IV SOLN
INTRAVENOUS | Status: DC | PRN
  Administered 2016-07-24: 200 mg via INTRAVENOUS

## 2016-07-24 MED ORDER — CLOPIDOGREL BISULFATE 75 MG PO TABS
75.0000 mg | ORAL_TABLET | Freq: Every day | ORAL | Status: DC
  Administered 2016-07-25 – 2016-07-26 (×2): 75 mg via ORAL
  Filled 2016-07-24 (×2): qty 1

## 2016-07-24 SURGICAL SUPPLY — 67 items
ADH SKN CLS APL DERMABOND .7 (GAUZE/BANDAGES/DRESSINGS) ×1
ADH SKN CLS LQ APL DERMABOND (GAUZE/BANDAGES/DRESSINGS) ×2
BANDAGE ESMARK 6X9 LF (GAUZE/BANDAGES/DRESSINGS) IMPLANT
BNDG CMPR 9X6 STRL LF SNTH (GAUZE/BANDAGES/DRESSINGS)
BNDG ESMARK 6X9 LF (GAUZE/BANDAGES/DRESSINGS)
CANISTER SUCT 3000ML PPV (MISCELLANEOUS) ×2 IMPLANT
CANNULA VESSEL 3MM 2 BLNT TIP (CANNULA) ×2 IMPLANT
CATH EMB 4FR 80CM (CATHETERS) ×1 IMPLANT
CATH EMB 5FR 80CM (CATHETERS) ×1 IMPLANT
CLIP TI MEDIUM 24 (CLIP) ×2 IMPLANT
CLIP TI WIDE RED SMALL 24 (CLIP) ×2 IMPLANT
COVER PROBE W GEL 5X96 (DRAPES) ×1 IMPLANT
CUFF TOURNIQUET SINGLE 24IN (TOURNIQUET CUFF) IMPLANT
CUFF TOURNIQUET SINGLE 34IN LL (TOURNIQUET CUFF) IMPLANT
CUFF TOURNIQUET SINGLE 44IN (TOURNIQUET CUFF) IMPLANT
DERMABOND ADHESIVE PROPEN (GAUZE/BANDAGES/DRESSINGS) ×2
DERMABOND ADVANCED (GAUZE/BANDAGES/DRESSINGS) ×1
DERMABOND ADVANCED .7 DNX12 (GAUZE/BANDAGES/DRESSINGS) ×1 IMPLANT
DERMABOND ADVANCED .7 DNX6 (GAUZE/BANDAGES/DRESSINGS) IMPLANT
DRAIN CHANNEL 15F RND FF W/TCR (WOUND CARE) IMPLANT
DRAPE HALF SHEET 40X57 (DRAPES) IMPLANT
DRAPE X-RAY CASS 24X20 (DRAPES) IMPLANT
DRSG TEGADERM 4X4.75 (GAUZE/BANDAGES/DRESSINGS) ×1 IMPLANT
ELECT REM PT RETURN 9FT ADLT (ELECTROSURGICAL) ×2
ELECTRODE REM PT RTRN 9FT ADLT (ELECTROSURGICAL) ×1 IMPLANT
EVACUATOR SILICONE 100CC (DRAIN) IMPLANT
GLOVE BIOGEL PI IND STRL 6.5 (GLOVE) IMPLANT
GLOVE BIOGEL PI IND STRL 7.5 (GLOVE) ×1 IMPLANT
GLOVE BIOGEL PI INDICATOR 6.5 (GLOVE) ×1
GLOVE BIOGEL PI INDICATOR 7.5 (GLOVE) ×1
GLOVE ECLIPSE 6.5 STRL STRAW (GLOVE) ×1 IMPLANT
GLOVE SURG SS PI 7.5 STRL IVOR (GLOVE) ×2 IMPLANT
GOWN STRL REUS W/ TWL LRG LVL3 (GOWN DISPOSABLE) ×2 IMPLANT
GOWN STRL REUS W/ TWL XL LVL3 (GOWN DISPOSABLE) ×1 IMPLANT
GOWN STRL REUS W/TWL LRG LVL3 (GOWN DISPOSABLE) ×6
GOWN STRL REUS W/TWL XL LVL3 (GOWN DISPOSABLE) ×2
HEMOSTAT SNOW SURGICEL 2X4 (HEMOSTASIS) ×1 IMPLANT
INSERT FOGARTY SM (MISCELLANEOUS) IMPLANT
KIT BASIN OR (CUSTOM PROCEDURE TRAY) ×2 IMPLANT
KIT ROOM TURNOVER OR (KITS) ×2 IMPLANT
MARKER GRAFT CORONARY BYPASS (MISCELLANEOUS) IMPLANT
NS IRRIG 1000ML POUR BTL (IV SOLUTION) ×5 IMPLANT
PACK PERIPHERAL VASCULAR (CUSTOM PROCEDURE TRAY) ×2 IMPLANT
PAD ARMBOARD 7.5X6 YLW CONV (MISCELLANEOUS) ×4 IMPLANT
SET COLLECT BLD 21X3/4 12 (NEEDLE) IMPLANT
SPONGE INTESTINAL PEANUT (DISPOSABLE) ×1 IMPLANT
STOPCOCK 4 WAY LG BORE MALE ST (IV SETS) ×1 IMPLANT
SUT ETHILON 3 0 PS 1 (SUTURE) IMPLANT
SUT GORETEX 6.0 TT13 (SUTURE) IMPLANT
SUT GORETEX 6.0 TT9 (SUTURE) IMPLANT
SUT PROLENE 5 0 C 1 24 (SUTURE) ×11 IMPLANT
SUT PROLENE 6 0 BV (SUTURE) ×8 IMPLANT
SUT PROLENE 7 0 BV 1 (SUTURE) IMPLANT
SUT SILK 2 0 SH (SUTURE) ×2 IMPLANT
SUT SILK 3 0 (SUTURE) ×4
SUT SILK 3-0 18XBRD TIE 12 (SUTURE) IMPLANT
SUT VIC AB 2-0 CT1 27 (SUTURE) ×4
SUT VIC AB 2-0 CT1 TAPERPNT 27 (SUTURE) ×2 IMPLANT
SUT VIC AB 3-0 SH 27 (SUTURE) ×8
SUT VIC AB 3-0 SH 27X BRD (SUTURE) ×2 IMPLANT
SUT VIC AB 4-0 PS2 27 (SUTURE) ×2 IMPLANT
SUT VICRYL 4-0 PS2 18IN ABS (SUTURE) ×4 IMPLANT
SYR 3ML LL SCALE MARK (SYRINGE) ×1 IMPLANT
TRAY FOLEY W/METER SILVER 16FR (SET/KITS/TRAYS/PACK) ×2 IMPLANT
TUBING EXTENTION W/L.L. (IV SETS) IMPLANT
UNDERPAD 30X30 (UNDERPADS AND DIAPERS) ×2 IMPLANT
WATER STERILE IRR 1000ML POUR (IV SOLUTION) ×2 IMPLANT

## 2016-07-24 NOTE — Transfer of Care (Signed)
Immediate Anesthesia Transfer of Care Note  Patient: Roy Moore  Procedure(s) Performed: Procedure(s): RIGHT FEMORAL TO ABOVE KNEE POPLITEAL ARTERY BYPASS GRAFT (Right)  Patient Location: PACU  Anesthesia Type:General  Level of Consciousness: awake, alert , oriented and patient cooperative  Airway & Oxygen Therapy: Patient Spontanous Breathing and Patient connected to nasal cannula oxygen  Post-op Assessment: Report given to RN, Post -op Vital signs reviewed and stable and Patient moving all extremities  Post vital signs: Reviewed and stable  Last Vitals:  Vitals:   07/24/16 1044  BP: (!) 158/71  Pulse: 82  Resp: 18  Temp: 36.5 C    Last Pain:  Vitals:   07/24/16 1121  TempSrc:   PainSc: 10-Worst pain ever         Complications: No apparent anesthesia complications

## 2016-07-24 NOTE — Anesthesia Procedure Notes (Signed)
Procedure Name: Intubation Date/Time: 07/24/2016 1:05 PM Performed by: Belinda Block Pre-anesthesia Checklist: Patient identified, Emergency Drugs available, Suction available and Patient being monitored Patient Re-evaluated:Patient Re-evaluated prior to inductionOxygen Delivery Method: Circle system utilized Preoxygenation: Pre-oxygenation with 100% oxygen Intubation Type: IV induction Ventilation: Mask ventilation without difficulty Laryngoscope Size: Mac and 3 Grade View: Grade I Tube type: Oral Tube size: 7.0 mm Number of attempts: 1 Airway Equipment and Method: Stylet Placement Confirmation: ETT inserted through vocal cords under direct vision,  positive ETCO2 and breath sounds checked- equal and bilateral Secured at: 22 cm Tube secured with: Tape Dental Injury: Teeth and Oropharynx as per pre-operative assessment

## 2016-07-24 NOTE — Anesthesia Preprocedure Evaluation (Addendum)
Anesthesia Evaluation  Patient identified by MRN, date of birth, ID band Patient awake    Reviewed: Allergy & Precautions, NPO status , Patient's Chart, lab work & pertinent test results  History of Anesthesia Complications (+) PONV and history of anesthetic complications  Airway Mallampati: I  TM Distance: >3 FB Neck ROM: Full    Dental  (+) Edentulous Upper, Edentulous Lower, Dental Advisory Given   Pulmonary Current Smoker,    Pulmonary exam normal breath sounds clear to auscultation       Cardiovascular + Peripheral Vascular Disease  Normal cardiovascular exam Rhythm:Regular Rate:Normal     Neuro/Psych PSYCHIATRIC DISORDERS Anxiety CVA, No Residual Symptoms    GI/Hepatic Neg liver ROS, GERD  ,  Endo/Other  negative endocrine ROS  Renal/GU negative Renal ROS     Musculoskeletal   Abdominal   Peds  Hematology   Anesthesia Other Findings   Reproductive/Obstetrics                                                           Anesthesia Evaluation  Patient identified by MRN, date of birth, ID band Patient awake    Reviewed: Allergy & Precautions  History of Anesthesia Complications Negative for: history of anesthetic complications  Airway Mallampati: I  TM Distance: >3 FB Neck ROM: Full    Dental  (+) Edentulous Upper, Edentulous Lower, Dental Advisory Given   Pulmonary Current Smoker,    Pulmonary exam normal        Cardiovascular negative cardio ROS Normal cardiovascular exam     Neuro/Psych PSYCHIATRIC DISORDERS Anxiety CVA, No Residual Symptoms    GI/Hepatic Neg liver ROS, GERD  ,  Endo/Other  negative endocrine ROS  Renal/GU negative Renal ROS     Musculoskeletal   Abdominal   Peds  Hematology   Anesthesia Other Findings   Reproductive/Obstetrics                            Anesthesia Physical Anesthesia Plan  ASA:  III  Anesthesia Plan: General   Post-op Pain Management:    Induction: Intravenous  Airway Management Planned: Oral ETT  Additional Equipment:   Intra-op Plan:   Post-operative Plan: Extubation in OR  Informed Consent: I have reviewed the patients History and Physical, chart, labs and discussed the procedure including the risks, benefits and alternatives for the proposed anesthesia with the patient or authorized representative who has indicated his/her understanding and acceptance.   Dental advisory given  Plan Discussed with: CRNA and Anesthesiologist  Anesthesia Plan Comments:         Anesthesia Quick Evaluation  Anesthesia Physical Anesthesia Plan  ASA: III  Anesthesia Plan: General   Post-op Pain Management:    Induction: Intravenous  PONV Risk Score and Plan: 3 and Ondansetron, Dexamethasone, Propofol and Midazolam  Airway Management Planned: Oral ETT  Additional Equipment:   Intra-op Plan:   Post-operative Plan: Possible Post-op intubation/ventilation  Informed Consent: I have reviewed the patients History and Physical, chart, labs and discussed the procedure including the risks, benefits and alternatives for the proposed anesthesia with the patient or authorized representative who has indicated his/her understanding and acceptance.   Dental advisory given  Plan Discussed with: CRNA, Anesthesiologist and Surgeon  Anesthesia Plan Comments:  Anesthesia Quick Evaluation  

## 2016-07-24 NOTE — Plan of Care (Signed)
Problem: Respiratory: Goal: Ability to achieve and maintain a regular respiratory rate will improve Outcome: Progressing Pt demonstrated use of incentive spirometer on arrival to room, encouraged used during shift

## 2016-07-24 NOTE — H&P (View-Only) (Signed)
[facility]  Patient name: Roy Moore MRN: [id number] DOB: 1955-07-25 Sex: male   REASON FOR VISIT:    Follow up  HISOTRY OF PRESENT ILLNESS:    Roy Moore is a 61 y.o. male returns today for follow-up.  I initially evaluated him in May 2018 for leg pain.  He has been dealing with chronic pain in both legs.  The right leg is worse than the left.  He will wake up in the middle of the night with pain.  He could also have pain during the day.  Sometimes there are no aggravating factors.  He describes the pain as beginning in his feet and shooting upwards towards his knee.  He is without open wounds.  He is a heavy smoker.  I felt that his pain could easily be secondary to neuropathic issues rather than vascular disease, however he didn't for had significant vascular disease.  Therefore on 06/09/2016, he underwent angiography.  This revealed an occluded right superficial femoral artery.  This was able to be primarily stented.  He comes back in today and his stent is occluded.  His ABI 0.3.  He is having continued pain.  He does not report any improvement of his pain after stenting.  He does not know when the stent clogged up.   The patient reports a 30 pound weight loss over the course of the past 6 months.  He is a current smoker.  He takes a statin for hypercholesterolemia.  He does have a history of stroke.  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  PAST MEDICAL HISTORY:   Past Medical History:  Diagnosis Date  . Anxiety   . Hyperlipidemia   . Stroke [facility]) 2009   no residual     FAMILY HISTORY:   Family History  Problem Relation Age of Onset  . Colon cancer Neg Hx   . Rectal cancer Neg Hx   . Stomach cancer Neg Hx   . Esophageal cancer Neg Hx     SOCIAL HISTORY:   Social History  Substance Use Topics  . Smoking status: Current Every Day Smoker    Packs/day:  1.00    Years: 47.00    Types: Cigarettes  . Smokeless tobacco: Never Used  . Alcohol use No     Comment: no alcohol in 23 years (12/02/15     ALLERGIES:   No Known Allergies   CURRENT MEDICATIONS:   Current Outpatient Prescriptions  Medication Sig Dispense Refill  . ALPRAZolam (XANAX) 1 MG tablet Take 1/2-1 tablet 2-3 times daily for anxiety. Try to minimize use and skip doses when possible. (Patient taking differently: Take 0.5-1 mg by mouth 3 (three) times daily as needed for anxiety. ) 90 tablet 2  . amitriptyline (ELAVIL) 50 MG tablet Take 1 tablet (50 mg total) by mouth at bedtime. 60 tablet 6  . aspirin EC 81 MG tablet Take 81 mg by mouth daily.    . clopidogrel (PLAVIX) 75 MG tablet Take 1 tablet (75 mg total) by mouth daily. 30 tablet 11  . diclofenac (VOLTAREN) 75 MG EC tablet Take 75 mg by mouth 2 (two) times daily as needed for mild pain.     . Thiamine HCl (VITAMIN B-1 PO) Take 1 tablet by mouth daily.     . B Complex-C (B-COMPLEX WITH VITAMIN C) tablet Take 1 tablet by mouth daily.    . Cholecalciferol (VITAMIN D3) 5000 units TABS Take 5,000  Units by mouth daily.    . cyclobenzaprine (FLEXERIL) 10 MG tablet Take 1 tablet (10 mg total) by mouth 3 (three) times daily as needed for muscle spasms. (Patient not taking: Reported on 07/08/2016) 30 tablet 0  . lovastatin (MEVACOR) 20 MG tablet Take 1 tablet (20 mg total) by mouth at bedtime. (Patient not taking: Reported on 07/08/2016) 90 tablet 3  . oxyCODONE-acetaminophen (PERCOCET/ROXICET) 5-325 MG tablet Take 1 tablet by mouth 2 (two) times daily as needed for moderate pain.      No current facility-administered medications for this visit.     REVIEW OF SYSTEMS:   [X]  denotes positive finding, [ ]  denotes negative finding Cardiac  Comments:  Chest pain or chest pressure:    Shortness of breath upon exertion:    Short of breath when lying flat:    Irregular heart rhythm:        Vascular    Pain in calf, thigh, or hip  brought on by ambulation: x   Pain in feet at night that wakes you up from your sleep:  x   Blood clot in your veins:    Leg swelling:         Pulmonary    Oxygen at home:    Productive cough:     Wheezing:         Neurologic    Sudden weakness in arms or legs:     Sudden numbness in arms or legs:     Sudden onset of difficulty speaking or slurred speech:    Temporary loss of vision in one eye:     Problems with dizziness:         Gastrointestinal    Blood in stool:     Vomited blood:         Genitourinary    Burning when urinating:     Blood in urine:        Psychiatric    Major depression:         Hematologic    Bleeding problems:    Problems with blood clotting too easily:        Skin    Rashes or ulcers:        Constitutional    Fever or chills:      PHYSICAL EXAM:   Vitals:   07/08/16 1227 07/08/16 1230  BP: (!) 156/86 (!) 159/82  Pulse: 79   Resp: 20   Temp: 97.6 F (36.4 C)   TempSrc: Oral   SpO2: 100%   Weight: 134 lb (60.8 kg)   Height: 5\' 9"  (1.753 m)     GENERAL: The patient is a well-nourished male, in no acute distress. The vital signs are documented above. CARDIAC: There is a regular rate and rhythm.  VASCULAR: Nonpalpable pedal pulse PULMONARY: Non-labored respirations MUSCULOSKELETAL: There are no major deformities or cyanosis. NEUROLOGIC: No focal weakness or paresthesias are detected. SKIN: There are no ulcers or rashes noted. PSYCHIATRIC: The patient has a normal affect.  STUDIES:   Duplex today shows an ABI of 0.3.  The stent is occluded.  MEDICAL ISSUES:   Continued leg pain.  The next step vascular perspective would be to proceed with femoral-popliteal bypass.  I have tentatively placed him on the schedule for Friday, June 29.  I will evaluate his vein in the operating room and use saphenous vein if it is adequate, otherwise he would need prosthetic.  He understands that this may not alleviate his symptoms especially if they  are neuropathic.  He does have significant vascular disease and therefore I do think this will make some improvement.  I am sending him back to Dr. Trenton Gammon for further evaluation from a neurosurgical perspective to see if there are any other explanation for his pain.    [name], MD [facility] [phone] Pager [phone]

## 2016-07-24 NOTE — Interval H&P Note (Signed)
History and Physical Interval Note:  07/24/2016 10:47 AM  Roy Moore  has presented today for surgery, with the diagnosis of Peripheral Vascular Disease with rest pain right lower extremity  I70.221  The various methods of treatment have been discussed with the patient and family. After consideration of risks, benefits and other options for treatment, the patient has consented to  Procedure(s): BYPASS GRAFT FEMORAL-POPLITEAL ARTERY (Right) as a surgical intervention .  The patient's history has been reviewed, patient examined, no change in status, stable for surgery.  I have reviewed the patient's chart and labs.  Questions were answered to the patient's satisfaction.     [name]

## 2016-07-24 NOTE — Plan of Care (Signed)
Problem: Education: Goal: Knowledge of [facility] Education information/materials will improve Outcome: Completed/Met Date Met: 07/24/16 Oriented patient to room, plan of care, safety measures, hand hygiene and expectations to progression of care. Pt verbalized understanding and demonstrated use of call bell and handwashing. Admission assessment complete, pt evaluated for further resources. Pt verbalized use of pain scale and when to notify RN for pain meds.

## 2016-07-24 NOTE — Op Note (Signed)
Patient name: Roy Moore MRN: [id number] DOB: [birth year] Sex: male  07/24/2016 Pre-operative Diagnosis: Right leg rest pain Post-operative diagnosis:  Same Surgeon:  [name] Assistants:  Leontine Locket Procedure:   Right common femoral to above-knee popliteal artery bypass graft with ipsilateral non-reversed greater saphenous vein Anesthesia:  Gen. Blood Loss:  See anesthesia record Specimens:  None  Findings:  Heavily calcified plaque within the common femoral artery which required endarterectomy.  The vein was of adequate caliber.  Indications:  This is a 61 year old gentleman with history of right leg claudication which progressed to rest pain.  He underwent percutaneous stenting which resolved symptoms however his stent has occluded and now he is here today for bypass.  Procedure:  The patient was identified in the holding area and taken to [place] [facility]  The patient was then placed supine on the table. general anesthesia was administered.  The patient was prepped and draped in the usual sterile fashion.  A time out was called and antibiotics were administered.  Ultrasound was used to evaluate the saphenous vein.  It appeared to be adequate.  The course of the vein was marked on the skin with a pen.  A longitudinal incision was made in the right groin.  A combination of cautery and sharp dissection were used to dissect out the common femoral, profundofemoral, and superficial femoral arteries which were all individually isolated with Silastic loops.  There was a defect in a branch of the profunda which required suture ligation.  I then proceeded with harvesting of the great saphenous vein from the groin down to above the knee via skip incisions.  Side branches were ligated between silk ties.  The vein measures approximately 3.5 mm.  The distal below-knee incision was used to expose the above-knee popliteal artery.  It appeared to be thickened but soft.  The vein was then ligated  distally with a 2-0 silk tie.  The saphenofemoral junction was ligated with 5-0 Prolene in 2 layers.  The vein was distended on the back table and prepared.  It distended nicely.  A long curve Gore tunneler was used to create a subsartorial tunnel.  At this point in time the patient was fully heparinized.  After the heparin circulated the common femoral profunda femoris superficial femoral artery were all occluded.  A #11 blade was used to make an arteriotomy in the distal common femoral artery.  There was a circumferential plaque in the common femoral artery which I elected to remove and performed endarterectomy up to the inguinal ligament.  I tacked down the distal flap with 6-0 Prolene.  The vein was then brought up to the incision.  A spatulated to fit the size of the arteriotomy.  A running anastomosis was created with 5-0 Prolene.  Prior to completion the profunda flushing maneuvers were performed and the anastomosis was completed.  There was excellent pulsatile flow through the graft.  The vein graft after having been marked for orientation was brought through the tunneler making sure to avoid twisting.  The above-knee popliteal artery was then occluded with vascular clamps.  The artery was opened with a #11 blade.  This was extended with Potts scissors in a longitudinal fashion.  The leg was extended and the vein was cut the appropriate length and spatulated to fit the size of the arteriotomy.  A running anastomosis was created with running 6-0 Prolene.  Prior to completion, the appropriate flushing maneuvers were performed.  I passed a #4  Fogarty catheter across the anastomosis to make sure this was widely patent.  The anastomosis was then completed and blood flow was reestablished to the right leg.  Hand-held Doppler used to evaluate the signals in the posterior tibial and anterior tibial artery.  These both were brisk signals.  50 mg protamine was administered.  Once hemostasis was adequate, the vein  harvest incisions were closed with 2 layers of 3-0 Vicryl.  The above-knee incision and the groin incision was closed by reapproximating the fascia with 2-0 Vicryl, the subcutaneous tissue with multiple layers of 3-0 Vicryl followed by 4-0 Vicryl the skin.  Dermabond was applied.  There were no immediate complications.   Disposition:  To PACU stable   V. [name], M.D. [facility] Office: [phone] Pager:  [phone]

## 2016-07-24 NOTE — Anesthesia Postprocedure Evaluation (Signed)
Anesthesia Post Note  Patient: Roy Moore  Procedure(s) Performed: Procedure(s) (LRB): RIGHT FEMORAL TO ABOVE KNEE POPLITEAL ARTERY BYPASS GRAFT (Right)     Patient location during evaluation: PACU Anesthesia Type: General Level of consciousness: awake Pain management: pain level controlled Vital Signs Assessment: post-procedure vital signs reviewed and stable Respiratory status: spontaneous breathing Cardiovascular status: stable Anesthetic complications: no    Last Vitals:  Vitals:   07/24/16 1044 07/24/16 1720  BP: (!) 158/71   Pulse: 82   Resp: 18   Temp: 36.5 C 36.4 C    Last Pain:  Vitals:   07/24/16 1720  TempSrc:   PainSc: Asleep                 Kumiko Fishman

## 2016-07-25 ENCOUNTER — Encounter (HOSPITAL_COMMUNITY): Payer: Self-pay | Admitting: Surgery

## 2016-07-25 LAB — BASIC METABOLIC PANEL
ANION GAP: 8 (ref 5–15)
BUN: 15 mg/dL (ref 6–20)
CHLORIDE: 104 mmol/L (ref 101–111)
CO2: 25 mmol/L (ref 22–32)
Calcium: 8.8 mg/dL — ABNORMAL LOW (ref 8.9–10.3)
Creatinine, Ser: 1.19 mg/dL (ref 0.61–1.24)
GFR calc Af Amer: >60 mL/min (ref >60)
GLUCOSE: 143 mg/dL — AB (ref 65–99)
POTASSIUM: 4.5 mmol/L (ref 3.5–5.1)
Sodium: 137 mmol/L (ref 135–145)

## 2016-07-25 LAB — CBC
HEMATOCRIT: 29 % — AB (ref 39.0–52.0)
HEMOGLOBIN: 9.1 g/dL — AB (ref 13.0–17.0)
MCH: 26.7 pg (ref 26.0–34.0)
MCHC: 31.4 g/dL (ref 30.0–36.0)
MCV: 85 fL (ref 78.0–100.0)
Platelets: 353 10*3/uL (ref 150–400)
RBC: 3.41 MIL/uL — AB (ref 4.22–5.81)
RDW: 14.8 % (ref 11.5–15.5)
WBC: 14.8 10*3/uL — AB (ref 4.0–10.5)

## 2016-07-25 LAB — MRSA PCR SCREENING: MRSA by PCR: NEGATIVE

## 2016-07-25 MED ORDER — ENSURE ENLIVE PO LIQD
237.0000 mL | Freq: Three times a day (TID) | ORAL | Status: DC
  Administered 2016-07-25 – 2016-07-26 (×3): 237 mL via ORAL

## 2016-07-25 MED ORDER — NICOTINE 14 MG/24HR TD PT24
14.0000 mg | MEDICATED_PATCH | Freq: Every day | TRANSDERMAL | Status: DC
  Administered 2016-07-25: 14 mg via TRANSDERMAL
  Filled 2016-07-25 (×2): qty 1

## 2016-07-25 NOTE — Progress Notes (Signed)
Initial Nutrition Assessment  DOCUMENTATION CODES:   Severe malnutrition in context of chronic illness  INTERVENTION:  1. Ensure Enlive po TID, each supplement provides 350 kcal and 20 grams of protein  NUTRITION DIAGNOSIS:   Malnutrition related to chronic illness as evidenced by severe depletion of muscle mass, severe depletion of body fat, percent weight loss.  GOAL:   Patient will meet greater than or equal to 90% of their needs  MONITOR:   PO intake, I & O's, Labs, Weight trends, Skin  REASON FOR ASSESSMENT:   Malnutrition Screening Tool    ASSESSMENT:   61 yo male 1 day s/p Right common femoral to above-knee popliteal artery bypass graft with ipsilateral non-reversed greater saphenous vein   Patient found with Heavily calcified plaque within the common femoral artery which required endarterectomy He reports PO intake has decreased dramatically over the past 3 months, but doesn't know why. Wt is down 30# over 3-4 months according to wife which indicates a severe 18.3% wt loss. Wife states he has been eating better, seeing his appetite improve.  Normal PO intake for him is a bowel of cereal, a sandwich for lunch, and chicken with a starch and vegetable for dinner. He does not like our food here in the hospital. Encouraged wife to bring him food to eat. Patient does drink Ensure, will provide during stay. Labs and medications reviewed: Colace, Protonix  Diet Order:  Diet regular Room service appropriate? Yes; Fluid consistency: Thin  Skin:  Reviewed, no issues  Last BM:  07/24/2016  Height:   Ht Readings from Last 1 Encounters:  07/24/16 5\' 9"  (1.753 m)    Weight:   Wt Readings from Last 1 Encounters:  07/24/16 134 lb 11.2 oz (61.1 kg)    Ideal Body Weight:     BMI:  Body mass index is 19.89 kg/m.  Estimated Nutritional Needs:   Kcal:  1700-2000 calories  Protein:  80-91 grams  Fluid:  >/= 1.7L  EDUCATION NEEDS:   No education needs identified  at this time  Roy Anis. Breckon Reeves, MS, RD LDN Inpatient Clinical Dietitian Pager [phone]

## 2016-07-25 NOTE — Evaluation (Signed)
Physical Therapy Evaluation Patient Details Name: Roy Moore MRN: [id number] DOB: Apr 26, 1955 Today's Date: 07/25/2016   History of Present Illness  Pt is a 61 yo male admitted 07/24/16 for a bypass graft to RLE. Pt underwent an common femoral above the knee popliteal bypass graft with ipsilateral non-reversed greater saphenous vein. PMH significant for anxiety, HLD, stroke, chronic leg pain.   Clinical Impression  Pt presents with the above diagnosis and below deficits for therapy evaluation. Prior to admission, pt was completely independent and able to drive. Pt requires Min guard for all mobility this session and is only able to perform short distance gait this session. Pt is limited by pain this session. Pt will benefit from continued acute rehab services in order to address the below deficits prior to discharge home.     Follow Up Recommendations Home health PT    Equipment Recommendations  Rolling walker with 5" wheels    Recommendations for Other Services       Precautions / Restrictions Precautions Precautions: Fall Restrictions Weight Bearing Restrictions: No      Mobility  Bed Mobility Overal bed mobility: Modified Independent             General bed mobility comments: Pt up in recliner when PT arrives  Transfers Overall transfer level: Needs assistance Equipment used: Rolling walker (2 wheeled) Transfers: Sit to/from Stand Sit to Stand: Min guard         General transfer comment: Min gaurd for safety from recliner with cues for hand placement  Ambulation/Gait Ambulation/Gait assistance: Min guard Ambulation Distance (Feet): 20 Feet Assistive device: Rolling walker (2 wheeled) Gait Pattern/deviations: Step-to pattern;Antalgic Gait velocity: decreased Gait velocity interpretation: Below normal speed for age/gender General Gait Details: moderate analgic gait with difficulting performing weight bearing through RLE. Pt is only able to perform 20' due  to pain with weight bearing.   Stairs            Wheelchair Mobility    Modified Rankin (Stroke Patients Only)       Balance                                             Pertinent Vitals/Pain      Home Living                        Prior Function                 Hand Dominance        Extremity/Trunk Assessment   Upper Extremity Assessment Upper Extremity Assessment: Defer to OT evaluation    Lower Extremity Assessment Lower Extremity Assessment: RLE deficits/detail RLE Deficits / Details: pt with post op pain and weakness in RLE with at least 3/5 grossly    Cervical / Trunk Assessment Cervical / Trunk Assessment: Normal  Communication      Cognition                                              General Comments      Exercises     Assessment/Plan    PT Assessment Patient needs continued PT services  PT Problem List Decreased strength;Decreased activity tolerance;Decreased balance;Decreased mobility;Decreased knowledge of use  of DME;Pain       PT Treatment Interventions Gait training;DME instruction;Stair training;Functional mobility training;Therapeutic activities;Therapeutic exercise;Balance training;Patient/family education    PT Goals (Current goals can be found in the Care Plan section)  Acute Rehab PT Goals Patient Stated Goal: to go home today  PT Goal Formulation: With patient Time For Goal Achievement: 08/01/16 Potential to Achieve Goals: Good    Frequency Min 3X/week   Barriers to discharge        Co-evaluation               AM-PAC PT "6 Clicks" Daily Activity  Outcome Measure Difficulty turning over in bed (including adjusting bedclothes, sheets and blankets)?: None Difficulty moving from lying on back to sitting on the side of the bed? : None Difficulty sitting down on and standing up from a chair with arms (e.g., wheelchair, bedside commode, etc,.)?: Total Help  needed moving to and from a bed to chair (including a wheelchair)?: A Little Help needed walking in hospital room?: A Little Help needed climbing 3-5 steps with a railing? : A Lot 6 Click Score: 17    End of Session Equipment Utilized During Treatment: Gait belt Activity Tolerance: Patient tolerated treatment well;Patient limited by pain Patient left: in chair;with call bell/phone within reach Nurse Communication: Mobility status PT Visit Diagnosis: Difficulty in walking, not elsewhere classified (R26.2);Pain Pain - Right/Left: Right Pain - part of body: Leg    Time: 1583-0940 PT Time Calculation (min) (ACUTE ONLY): 21 min   Charges:         PT G Codes:        Scheryl Marten PT, DPT  [phone]   Jacqulyn Liner Sloan Leiter 07/25/2016, 1:28 PM

## 2016-07-25 NOTE — Progress Notes (Addendum)
  Progress Note    07/25/2016 7:58 AM 1 Day Post-Op  Subjective:  Says he is sore this morning; says he tried to walk with nurse this morning and didn't get too far.  Wants to go home tomorrow.  Afebrile HR  70's-100's NSR 921'J-941'D systolic 40% RA  Vitals:   07/25/16 0327 07/25/16 0746  BP: (!) 161/75 (!) 149/70  Pulse: 95   Resp: 13   Temp: 98.1 F (36.7 C) 97.8 F (36.6 C)    Physical Exam: Cardiac:  regular Lungs:  Non labored Incisions:  All incisions are clean and dry without hematoma Extremities:  Right foot is warm with brisk peroneal doppler signal & monophasic AT/PT signals   CBC    Component Value Date/Time   WBC 14.8 (H) 07/25/2016 0217   RBC 3.41 (L) 07/25/2016 0217   HGB 9.1 (L) 07/25/2016 0217   HCT 29.0 (L) 07/25/2016 0217   PLT 353 07/25/2016 0217   MCV 85.0 07/25/2016 0217   MCV 85.0 05/08/2015 1347   MCH 26.7 07/25/2016 0217   MCHC 31.4 07/25/2016 0217   RDW 14.8 07/25/2016 0217   LYMPHSABS 1.1 04/12/2016 1450   MONOABS 1.0 04/12/2016 1450   EOSABS 0.2 04/12/2016 1450   BASOSABS 0.0 04/12/2016 1450    BMET    Component Value Date/Time   NA 137 07/25/2016 0217   K 4.5 07/25/2016 0217   CL 104 07/25/2016 0217   CO2 25 07/25/2016 0217   GLUCOSE 143 (H) 07/25/2016 0217   BUN 15 07/25/2016 0217   CREATININE 1.19 07/25/2016 0217   CREATININE 1.14 11/28/2015 1300   CALCIUM 8.8 (L) 07/25/2016 0217   GFRNONAA >60 07/25/2016 0217   GFRNONAA 70 11/28/2015 1300   GFRAA >60 07/25/2016 0217   GFRAA 81 11/28/2015 1300    INR    Component Value Date/Time   INR 1.03 07/24/2016 1059     Intake/Output Summary (Last 24 hours) at 07/25/16 0758 Last data filed at 07/25/16 0557  Gross per 24 hour  Intake           3292.5 ml  Output             2395 ml  Net            897.5 ml     Assessment:  61 y.o. male is s/p:  Right common femoral to above-knee popliteal artery bypass graft with ipsilateral non-reversed greater saphenous vein  1  Day Post-Op  Plan: -bypass is patent with brisk peroneal doppler signal and monophasic AT/PT -DVT prophylaxis:  Lovenox to start later today -will transfer to the floor.   -mobilize today; hopefully can work with PT today.   Leontine Locket, PA-C [facility] [phone] 07/25/2016 7:58 AM  I have interviewed the patient and examined the patient. I agree with the findings by the PA. He is requesting a nicotine patch which I have ordered.  Start Plavix as per Dr. Trula Slade. Probably home tomorrow.   Gae Gallop, MD [phone]

## 2016-07-26 MED ORDER — OXYCODONE-ACETAMINOPHEN 5-325 MG PO TABS: 1.0000 | ORAL_TABLET | Freq: Four times a day (QID) | ORAL | 0 refills | Status: DC | PRN

## 2016-07-26 NOTE — Progress Notes (Signed)
Discharged to home with family office visits in place teaching done  

## 2016-07-26 NOTE — Discharge Summary (Signed)
Discharge Summary     Roy Moore 09/08/1955 61 y.o. male  [id number]  Admission Date: 07/24/2016  Discharge Date: 07/26/16  Physician: Serafina Mitchell, MD  Admission Diagnosis: PAD I70.92   HPI:  This is a 61 y.o. male returns today for follow-up.  I initially evaluated him in May 2018 for leg pain.  He has been dealing with chronic pain in both legs.  The right leg is worse than the left.  He will wake up in the middle of the night with pain.  He could also have pain during the day.  Sometimes there are no aggravating factors.  He describes the pain as beginning in his feet and shooting upwards towards his knee.  He is without open wounds.  He is a heavy smoker.  I felt that his pain could easily be secondary to neuropathic issues rather than vascular disease, however he didn't for had significant vascular disease.  Therefore on 06/09/2016, he underwent angiography.  This revealed an occluded right superficial femoral artery.  This was able to be primarily stented.  He comes back in today and his stent is occluded.  His ABI 0.3.  He is having continued pain.  He does not report any improvement of his pain after stenting.  He does not know when the stent clogged up.  The patient reports a 30 pound weight loss over the course of the past 6 months. He is a current smoker. He takes a statin for hypercholesterolemia. He does have a history of stroke. 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   Hospital Course:  The patient was admitted to the hospital and taken to the operating room on 07/24/2016 and underwent: Right common femoral to above-knee popliteal artery bypass graft with ipsilateral non-reversed greater saphenous vein    The pt tolerated the procedure well and was transported to the PACU in good condition.   By POD 1, he was doing well with a patent bypass graft.  He was transferred to the telemetry floor.    By POD 2, he was doing  well with easily palpable right DP/PT pulses.  He does drink Boost at home, which I encouraged him to continue to promote healing.  PT recommended HHPT and this is arranged prior to discharge.    The remainder of the hospital course consisted of increasing mobilization and increasing intake of solids without difficulty.  CBC    Component Value Date/Time   WBC 14.8 (H) 07/25/2016 0217   RBC 3.41 (L) 07/25/2016 0217   HGB 9.1 (L) 07/25/2016 0217   HCT 29.0 (L) 07/25/2016 0217   PLT 353 07/25/2016 0217   MCV 85.0 07/25/2016 0217   MCV 85.0 05/08/2015 1347   MCH 26.7 07/25/2016 0217   MCHC 31.4 07/25/2016 0217   RDW 14.8 07/25/2016 0217   LYMPHSABS 1.1 04/12/2016 1450   MONOABS 1.0 04/12/2016 1450   EOSABS 0.2 04/12/2016 1450   BASOSABS 0.0 04/12/2016 1450    BMET    Component Value Date/Time   NA 137 07/25/2016 0217   K 4.5 07/25/2016 0217   CL 104 07/25/2016 0217   CO2 25 07/25/2016 0217   GLUCOSE 143 (H) 07/25/2016 0217   BUN 15 07/25/2016 0217   CREATININE 1.19 07/25/2016 0217   CREATININE 1.14 11/28/2015 1300   CALCIUM 8.8 (L) 07/25/2016 0217   GFRNONAA >60 07/25/2016 0217   GFRNONAA 70 11/28/2015 1300   GFRAA >60 07/25/2016 0217   GFRAA 81 11/28/2015 1300  Discharge Instructions    Call MD for:  redness, tenderness, or signs of infection (pain, swelling, bleeding, redness, odor or green/yellow discharge around incision site)    Complete by:  As directed    Call MD for:  severe or increased pain, loss or decreased feeling  in affected limb(s)    Complete by:  As directed    Call MD for:  temperature >100.5    Complete by:  As directed    Discharge wound care:    Complete by:  As directed    Wash the groin wound with soap and water daily and pat dry. (No tub bath-only shower)  Then put a dry gauze or washcloth there to keep this area dry daily and as needed.  Do not use Vaseline or neosporin on your incisions.  Only use soap and water on your incisions and then  protect and keep dry.   Driving Restrictions    Complete by:  As directed    No driving for 2 weeks & while taking pain medication.   Lifting restrictions    Complete by:  As directed    No heavy lifting for 4 weeks   Resume previous diet    Complete by:  As directed       Discharge Diagnosis:  PAD I70.92  Secondary Diagnosis: Patient Active Problem List   Diagnosis Date Noted  . PAD (peripheral artery disease) ([place]) 07/24/2016  . Peripheral arterial disease ([place]) 07/17/2016  . Lumbar stenosis with neurogenic claudication 12/03/2015  . CVA, old, hemiparesis ([place]) 03/24/2011  . ANXIETY DISORDER 10/04/2007  . TOBACCO ABUSE 09/30/2007  . GERD 09/30/2007  . DYSLIPIDEMIA 09/15/2007  . STROKE WITH LEFT HEMIPARESIS 09/15/2007   Past Medical History:  Diagnosis Date  . Anxiety   . Complication of anesthesia   . Hyperlipidemia   . Peripheral vascular disease ([place])   . PONV (postoperative nausea and vomiting)   . Stroke [facility]) 2009   no residual     Allergies as of 07/26/2016      Reactions   No Known Allergies       Medication List    TAKE these medications   ALPRAZolam 1 MG tablet Commonly known as:  XANAX Take 1/2-1 tablet 2-3 times daily for anxiety. Try to minimize use and skip doses when possible. What changed:  how much to take  how to take this  when to take this  reasons to take this  additional instructions   amitriptyline 50 MG tablet Commonly known as:  ELAVIL Take 1 tablet (50 mg total) by mouth at bedtime.   aspirin EC 81 MG tablet Take 81 mg by mouth daily.   clopidogrel 75 MG tablet Commonly known as:  PLAVIX Take 1 tablet (75 mg total) by mouth daily.   cyclobenzaprine 10 MG tablet Commonly known as:  FLEXERIL Take 1 tablet (10 mg total) by mouth 3 (three) times daily.   diclofenac 75 MG EC tablet Commonly known as:  VOLTAREN Take 75 mg by mouth 2 (two) times daily as needed for mild pain.   lovastatin 20 MG tablet Commonly known  as:  MEVACOR Take 1 tablet (20 mg total) by mouth at bedtime.   oxyCODONE-acetaminophen 5-325 MG tablet Commonly known as:  PERCOCET/ROXICET Take 1 tablet by mouth every 6 (six) hours as needed for moderate pain. What changed:  when to take this            Durable Medical Equipment  Start     Ordered   07/26/16 [phone]  For home use only DME Walker rolling  Once    Question:  Patient needs a walker to treat with the following condition  Answer:  S/P femoral-popliteal bypass surgery   07/26/16 0837      Prescriptions given: 1.  Roxicet #30 No Refill  Instructions: 1.  Wash the groin wound with soap and water daily and pat dry. (No tub bath-only shower)  Then put a dry gauze or washcloth there to keep this area dry daily and as needed.  Do not use Vaseline or neosporin on your incisions.  Only use soap and water on your incisions and then protect and keep dry. 2.  No driving x 2 weeks & while taking pain medication 3.  No heavy lifting x 4 weeks. 4.  Shower daily starting 07/27/16 5.  Continue drinking Boost to promote healing.  Disposition: home  Patient's condition: is Good  Follow up: 1. Dr. Trula Slade in 2 weeks with ABI's   Leontine Locket, PA-C [facility] [phone] 07/26/2016  8:42 AM  - For VQI Registry use ---   Post-op:  Wound infection: No  Graft infection: No  Transfusion: No    If yes, n/a units given New Arrhythmia: No Ipsilateral amputation: No, [ ]  Minor, [ ]  BKA, [ ]  AKA Discharge patency: [x ] Primary, [ ]  Primary assisted, [ ]  Secondary, [ ]  Occluded Patency judged by: [ ]  Dopper only, [ ]  Palpable graft pulse, [x]  Palpable distal pulse, [ ]  ABI inc. > 0.15, [ ]  Duplex Discharge ABI: R not done, L  D/C Ambulatory Status: Ambulatory  Complications: MI: No, [ ]  Troponin only, [ ]  EKG or Clinical CHF: No Resp failure:No, [ ]  Pneumonia, [ ]  Ventilator Chg in renal function: No, [ ]  Inc. Cr > 0.5, [ ]  Temp. Dialysis,  [ ]   Permanent dialysis Stroke: No, [ ]  Minor, [ ]  Major Return to OR: No  Reason for return to OR: [ ]  Bleeding, [ ]  Infection, [ ]  Thrombosis, [ ]  Revision  Discharge medications: Statin use:  yes ASA use:  yes Plavix use:  yes Beta blocker use: no CCB use:  No ACEI use:   no ARB use:  no Coumadin use: no

## 2016-07-26 NOTE — Evaluation (Signed)
Occupational Therapy Evaluation Patient Details Name: Roy Moore MRN: [id number] DOB: March 02, 1955 Today's Date: 07/26/2016    History of Present Illness Pt is a 61 yo male admitted 07/24/16 for a bypass graft to RLE. Pt underwent an common femoral above the knee popliteal bypass graft with ipsilateral non-reversed greater saphenous vein. PMH significant for anxiety, HLD, stroke, chronic leg pain.    Clinical Impression   Pt reports he was independent with ADL PTA. Currently pt min guard for functional mobility and min assist for LB ADL. Pt planning to d/c home with 24/7 supervision from his wife. Pt would benefit from continued skilled OT to address established goals.    Follow Up Recommendations  No OT follow up;Supervision/Assistance - 24 hour    Equipment Recommendations  None recommended by OT    Recommendations for Other Services       Precautions / Restrictions Precautions Precautions: Fall Restrictions Weight Bearing Restrictions: No      Mobility Bed Mobility Overal bed mobility: Modified Independent             General bed mobility comments: HOB elevated, no physical assist  Transfers Overall transfer level: Needs assistance Equipment used: Rolling walker (2 wheeled) Transfers: Sit to/from Stand Sit to Stand: Min guard         General transfer comment: good hand placement, min guard for safety    Balance Overall balance assessment: Needs assistance Sitting-balance support: Feet supported;No upper extremity supported Sitting balance-Leahy Scale: Good     Standing balance support: Bilateral upper extremity supported Standing balance-Leahy Scale: Poor                             ADL either performed or assessed with clinical judgement   ADL Overall ADL's : Needs assistance/impaired Eating/Feeding: Set up;Sitting   Grooming: Min guard;Standing   Upper Body Bathing: Set up;Sitting   Lower Body Bathing: Minimal assistance;Sit  to/from stand   Upper Body Dressing : Set up;Sitting   Lower Body Dressing: Minimal assistance;Sit to/from stand Lower Body Dressing Details (indicate cue type and reason): Educated on R leg into clothing first. Able to adjust socks in sitting Toilet Transfer: Min guard;Ambulation;RW Toilet Transfer Details (indicate cue type and reason): Simulated by sit to stand from EOB with functional mobility in room       Tub/Shower Transfer Details (indicate cue type and reason): Educated on use of shower chair for fall prevention and safety; pt verbalized understanding Functional mobility during ADLs: Min guard;Rolling walker       Vision         Perception     Praxis      Pertinent Vitals/Pain Pain Assessment: 0-10 Pain Score: 8  Pain Location: RLE Pain Descriptors / Indicators: Aching;Discomfort Pain Intervention(s): Monitored during session;Limited activity within patient's tolerance     Hand Dominance Right   Extremity/Trunk Assessment Upper Extremity Assessment Upper Extremity Assessment: Overall WFL for tasks assessed   Lower Extremity Assessment Lower Extremity Assessment: Defer to PT evaluation   Cervical / Trunk Assessment Cervical / Trunk Assessment: Normal   Communication Communication Communication: No difficulties   Cognition Arousal/Alertness: Awake/alert Behavior During Therapy: WFL for tasks assessed/performed Overall Cognitive Status: Within Functional Limits for tasks assessed                                     General Comments  Exercises     Shoulder Instructions      Home Living Family/patient expects to be discharged to:: Private residence Living Arrangements: Spouse/significant other Available Help at Discharge: Family;Available 24 hours/day Type of Home: House Home Access: Stairs to enter [employer] of Steps: 10 steps to enter Entrance Stairs-Rails: None Home Layout: One level     Bathroom  Shower/Tub: Occupational psychologist: Standard     Home Equipment: Environmental consultant - 4 wheels;Cane - single point;Bedside commode;Shower seat          Prior Functioning/Environment Level of Independence: Independent                 OT Problem List: Decreased strength;Decreased range of motion;Impaired balance (sitting and/or standing);Pain      OT Treatment/Interventions: Self-care/ADL training;Energy conservation;DME and/or AE instruction;Therapeutic activities;Patient/family education;Balance training    OT Goals(Current goals can be found in the care plan section) Acute Rehab OT Goals Patient Stated Goal: go home OT Goal Formulation: With patient Time For Goal Achievement: 08/09/16 Potential to Achieve Goals: Good ADL Goals Pt Will Perform Lower Body Bathing: with modified independence;sit to/from stand Pt Will Perform Lower Body Dressing: with modified independence;sit to/from stand Pt Will Transfer to Toilet: with modified independence;ambulating;bedside commode Pt Will Perform Toileting - Clothing Manipulation and hygiene: with modified independence;sit to/from stand Pt Will Perform Tub/Shower Transfer: Shower transfer;with supervision;ambulating;shower seat;rolling walker  OT Frequency: Min 2X/week   Barriers to D/C: Inaccessible home environment  steps into home       Co-evaluation              AM-PAC PT "6 Clicks" Daily Activity     Outcome Measure Help from another person eating meals?: None Help from another person taking care of personal grooming?: A Little Help from another person toileting, which includes using toliet, bedpan, or urinal?: A Little Help from another person bathing (including washing, rinsing, drying)?: A Little Help from another person to put on and taking off regular upper body clothing?: None Help from another person to put on and taking off regular lower body clothing?: A Little 6 Click Score: 20   End of Session Equipment  Utilized During Treatment: Rolling walker  Activity Tolerance: Patient tolerated treatment well;Patient limited by pain Patient left: in bed;with call bell/phone within reach  OT Visit Diagnosis: Unsteadiness on feet (R26.81);Other abnormalities of gait and mobility (R26.89);Pain Pain - Right/Left: Right Pain - part of body: Leg                Time: 4239-5320 OT Time Calculation (min): 10 min Charges:  OT General Charges $OT Visit: 1 Procedure OT Evaluation $OT Eval Moderate Complexity: 1 Procedure G-Codes:     Reed Dady A. Ulice Brilliant, M.S., OTR/L Pager: [place] 07/26/2016, 10:31 AM

## 2016-07-26 NOTE — Care Management Note (Signed)
Case Management Note  Patient Details  Name: TAJI BARRETTO MRN: [id number] Date of Birth: May 09, 1955  Subjective/Objective:                 Spoke with patient's wife, they would like to use [facility] for [facility] PT. She states they already have a RW he can use at home. Patient's wife notified of DC order, provided number to RN station to contact RN to discuss DC further. No other CM needs identified.    Action/Plan:  DC to home w [place] PT through [facility].  Expected Discharge Date:  07/26/16               Expected Discharge Plan:  [place]  In-House Referral:     Discharge planning Services  CM Consult  Post Acute Care Choice:  Home Health Choice offered to:  Spouse  DME Arranged:    DME Agency:     HH Arranged:  PT [place]:  [place]  Status of Service:  Completed, signed off  If discussed at [place] of Stay Meetings, dates discussed:    Additional Comments:  Carles Collet, RN 07/26/2016, 10:01 AM

## 2016-07-26 NOTE — Progress Notes (Addendum)
  Progress Note    07/26/2016 8:36 AM 2 Days Post-Op  Subjective:  No complaints.  Wants to go home.  Drinks Boost at home.    Afebrile HR 80's-90's NSR 175'Z-025'E systolic 52% RA  Vitals:   07/25/16 2052 07/26/16 0555  BP: 134/69 (!) 143/68  Pulse: (!) 104 88  Resp: 18 18  Temp: 98.1 F (36.7 C) 97.7 F (36.5 C)    Physical Exam: Cardiac:  regular Lungs:  Non labored Incisions: all are healing nicely. Extremities:  Easily palpable right DP/PT pulse; right foot is warm.  CBC    Component Value Date/Time   WBC 14.8 (H) 07/25/2016 0217   RBC 3.41 (L) 07/25/2016 0217   HGB 9.1 (L) 07/25/2016 0217   HCT 29.0 (L) 07/25/2016 0217   PLT 353 07/25/2016 0217   MCV 85.0 07/25/2016 0217   MCV 85.0 05/08/2015 1347   MCH 26.7 07/25/2016 0217   MCHC 31.4 07/25/2016 0217   RDW 14.8 07/25/2016 0217   LYMPHSABS 1.1 04/12/2016 1450   MONOABS 1.0 04/12/2016 1450   EOSABS 0.2 04/12/2016 1450   BASOSABS 0.0 04/12/2016 1450    BMET    Component Value Date/Time   NA 137 07/25/2016 0217   K 4.5 07/25/2016 0217   CL 104 07/25/2016 0217   CO2 25 07/25/2016 0217   GLUCOSE 143 (H) 07/25/2016 0217   BUN 15 07/25/2016 0217   CREATININE 1.19 07/25/2016 0217   CREATININE 1.14 11/28/2015 1300   CALCIUM 8.8 (L) 07/25/2016 0217   GFRNONAA >60 07/25/2016 0217   GFRNONAA 70 11/28/2015 1300   GFRAA >60 07/25/2016 0217   GFRAA 81 11/28/2015 1300    INR    Component Value Date/Time   INR 1.03 07/24/2016 1059     Intake/Output Summary (Last 24 hours) at 07/26/16 0836 Last data filed at 07/26/16 0557  Gross per 24 hour  Intake              290 ml  Output             1475 ml  Net            -1185 ml     Assessment:  61 y.o. male is s/p:  Right common femoral to above-knee popliteal artery bypass graft with ipsilateral non-reversed greater saphenous vein   2 Days Post-Op  Plan: -pt with palpable right DP/PT pulse this morning.  Incisions look good. -discharge home today  after HHPT is arranged.  -f/u with Dr. Trula Slade in 2-3 weeks. -home on Plavix per Dr. Zada Girt, PA-C [facility] [phone] 07/26/2016 8:36 AM  I have interviewed the patient and examined the patient. I agree with the findings by the PA. Agree with plans for discharge today.  Gae Gallop, MD [phone]

## 2016-07-28 ENCOUNTER — Other Ambulatory Visit: Payer: Self-pay | Admitting: *Deleted

## 2016-07-28 ENCOUNTER — Telehealth: Payer: Self-pay | Admitting: Surgery

## 2016-07-28 DIAGNOSIS — I739 Peripheral vascular disease, unspecified: Secondary | ICD-10-CM

## 2016-07-28 MED ORDER — CLOPIDOGREL BISULFATE 75 MG PO TABS: 75.0000 mg | ORAL_TABLET | Freq: Every day | ORAL | 11 refills | Status: DC

## 2016-07-28 NOTE — Telephone Encounter (Signed)
-----   Message from Mena Goes, RN sent at 07/26/2016  9:09 PM EDT ----- Regarding: 2-3 weeks w/ abi   ----- Message ----- From: Gabriel Earing, PA-C Sent: 07/26/2016   8:43 AM To: Vvs Charge Pool  S/p right fem pop.  F/u with Dr. Trula Slade in 2-3 weeks with ABI's.   Thanks

## 2016-07-28 NOTE — Telephone Encounter (Signed)
Sched lab 08/10/16 at 4:00 and MD 08/19/16 at 8:30. Spoke to pt's wife.

## 2016-07-29 DIAGNOSIS — Z48812 Encounter for surgical aftercare following surgery on the circulatory system: Secondary | ICD-10-CM | POA: Diagnosis not present

## 2016-07-29 DIAGNOSIS — Z951 Presence of aortocoronary bypass graft: Secondary | ICD-10-CM | POA: Diagnosis not present

## 2016-07-29 DIAGNOSIS — F17219 Nicotine dependence, cigarettes, with unspecified nicotine-induced disorders: Secondary | ICD-10-CM | POA: Diagnosis not present

## 2016-07-29 DIAGNOSIS — F419 Anxiety disorder, unspecified: Secondary | ICD-10-CM | POA: Diagnosis not present

## 2016-07-29 DIAGNOSIS — I69354 Hemiplegia and hemiparesis following cerebral infarction affecting left non-dominant side: Secondary | ICD-10-CM | POA: Diagnosis not present

## 2016-07-29 DIAGNOSIS — I739 Peripheral vascular disease, unspecified: Secondary | ICD-10-CM | POA: Diagnosis not present

## 2016-07-29 DIAGNOSIS — E785 Hyperlipidemia, unspecified: Secondary | ICD-10-CM | POA: Diagnosis not present

## 2016-08-04 ENCOUNTER — Other Ambulatory Visit: Payer: Self-pay

## 2016-08-04 DIAGNOSIS — Z48812 Encounter for surgical aftercare following surgery on the circulatory system: Secondary | ICD-10-CM

## 2016-08-04 DIAGNOSIS — I739 Peripheral vascular disease, unspecified: Secondary | ICD-10-CM

## 2016-08-05 ENCOUNTER — Encounter: Payer: Self-pay | Admitting: Surgery

## 2016-08-05 DIAGNOSIS — F17219 Nicotine dependence, cigarettes, with unspecified nicotine-induced disorders: Secondary | ICD-10-CM | POA: Diagnosis not present

## 2016-08-05 DIAGNOSIS — I69354 Hemiplegia and hemiparesis following cerebral infarction affecting left non-dominant side: Secondary | ICD-10-CM | POA: Diagnosis not present

## 2016-08-05 DIAGNOSIS — I739 Peripheral vascular disease, unspecified: Secondary | ICD-10-CM | POA: Diagnosis not present

## 2016-08-05 DIAGNOSIS — F419 Anxiety disorder, unspecified: Secondary | ICD-10-CM | POA: Diagnosis not present

## 2016-08-05 DIAGNOSIS — E785 Hyperlipidemia, unspecified: Secondary | ICD-10-CM | POA: Diagnosis not present

## 2016-08-05 DIAGNOSIS — Z951 Presence of aortocoronary bypass graft: Secondary | ICD-10-CM | POA: Diagnosis not present

## 2016-08-05 DIAGNOSIS — Z48812 Encounter for surgical aftercare following surgery on the circulatory system: Secondary | ICD-10-CM | POA: Diagnosis not present

## 2016-08-07 DIAGNOSIS — Z48812 Encounter for surgical aftercare following surgery on the circulatory system: Secondary | ICD-10-CM | POA: Diagnosis not present

## 2016-08-07 DIAGNOSIS — F17219 Nicotine dependence, cigarettes, with unspecified nicotine-induced disorders: Secondary | ICD-10-CM | POA: Diagnosis not present

## 2016-08-07 DIAGNOSIS — I739 Peripheral vascular disease, unspecified: Secondary | ICD-10-CM | POA: Diagnosis not present

## 2016-08-07 DIAGNOSIS — Z951 Presence of aortocoronary bypass graft: Secondary | ICD-10-CM | POA: Diagnosis not present

## 2016-08-07 DIAGNOSIS — E785 Hyperlipidemia, unspecified: Secondary | ICD-10-CM | POA: Diagnosis not present

## 2016-08-07 DIAGNOSIS — F419 Anxiety disorder, unspecified: Secondary | ICD-10-CM | POA: Diagnosis not present

## 2016-08-07 DIAGNOSIS — I69354 Hemiplegia and hemiparesis following cerebral infarction affecting left non-dominant side: Secondary | ICD-10-CM | POA: Diagnosis not present

## 2016-08-10 ENCOUNTER — Ambulatory Visit (HOSPITAL_COMMUNITY)
Admission: RE | Admit: 2016-08-10 | Discharge: 2016-08-10 | Disposition: A | Discharge status: Home / Self Care | Payer: Medicare HMO | Source: Ambulatory Visit | Attending: Surgery | Admitting: Surgery

## 2016-08-10 DIAGNOSIS — F17219 Nicotine dependence, cigarettes, with unspecified nicotine-induced disorders: Secondary | ICD-10-CM | POA: Diagnosis not present

## 2016-08-10 DIAGNOSIS — Z48812 Encounter for surgical aftercare following surgery on the circulatory system: Secondary | ICD-10-CM | POA: Insufficient documentation

## 2016-08-10 DIAGNOSIS — E785 Hyperlipidemia, unspecified: Secondary | ICD-10-CM | POA: Diagnosis not present

## 2016-08-10 DIAGNOSIS — I739 Peripheral vascular disease, unspecified: Secondary | ICD-10-CM | POA: Insufficient documentation

## 2016-08-10 DIAGNOSIS — F419 Anxiety disorder, unspecified: Secondary | ICD-10-CM | POA: Diagnosis not present

## 2016-08-10 DIAGNOSIS — Z951 Presence of aortocoronary bypass graft: Secondary | ICD-10-CM | POA: Diagnosis not present

## 2016-08-10 DIAGNOSIS — I69354 Hemiplegia and hemiparesis following cerebral infarction affecting left non-dominant side: Secondary | ICD-10-CM | POA: Diagnosis not present

## 2016-08-12 ENCOUNTER — Ambulatory Visit (HOSPITAL_COMMUNITY)
Admission: EM | Admit: 2016-08-12 | Discharge: 2016-08-12 | Disposition: A | Discharge status: Other Acute Inpatient Hospital | Payer: Medicare HMO

## 2016-08-12 ENCOUNTER — Telehealth: Payer: Self-pay | Admitting: *Deleted

## 2016-08-12 NOTE — Telephone Encounter (Signed)
Received patient as a walk in requesting a handicap application/renewal be filled out today because he got a $200 ticket for an expired handicap sticker.    Advised that Dr. Gwenlyn Found is not in office until next week to sign.    Patient unhappy, requesting "someone in the office to sign it today".     After chart review-patient has only seen Dr. Gwenlyn Found 6/22 for preop clearance and f/u is PRN.  Advised patient that this would need to be filled out and signed by PCP or surgeon who he has been following.    Patient verbalized understanding.

## 2016-08-13 DIAGNOSIS — E785 Hyperlipidemia, unspecified: Secondary | ICD-10-CM | POA: Diagnosis not present

## 2016-08-13 DIAGNOSIS — F419 Anxiety disorder, unspecified: Secondary | ICD-10-CM | POA: Diagnosis not present

## 2016-08-13 DIAGNOSIS — I739 Peripheral vascular disease, unspecified: Secondary | ICD-10-CM | POA: Diagnosis not present

## 2016-08-13 DIAGNOSIS — Z48812 Encounter for surgical aftercare following surgery on the circulatory system: Secondary | ICD-10-CM | POA: Diagnosis not present

## 2016-08-13 DIAGNOSIS — Z951 Presence of aortocoronary bypass graft: Secondary | ICD-10-CM | POA: Diagnosis not present

## 2016-08-13 DIAGNOSIS — F17219 Nicotine dependence, cigarettes, with unspecified nicotine-induced disorders: Secondary | ICD-10-CM | POA: Diagnosis not present

## 2016-08-13 DIAGNOSIS — I69354 Hemiplegia and hemiparesis following cerebral infarction affecting left non-dominant side: Secondary | ICD-10-CM | POA: Diagnosis not present

## 2016-08-19 ENCOUNTER — Ambulatory Visit (INDEPENDENT_AMBULATORY_CARE_PROVIDER_SITE_OTHER): Payer: Self-pay | Admitting: Surgery

## 2016-08-19 DIAGNOSIS — I70211 Atherosclerosis of native arteries of extremities with intermittent claudication, right leg: Secondary | ICD-10-CM

## 2016-08-19 NOTE — Progress Notes (Signed)
Patient name: Roy Moore MRN: [id number] DOB: [birth year] Sex: male  REASON FOR VISIT:     post op  HISTORY OF PRESENT ILLNESS:   Roy Moore is a 61 y.o. male who returns today for his first postoperative visit.  He is status post right femoral to above-knee popliteal artery bypass graft with ipsilateral non-reversed greater saphenous vein.  He had heavily calcified plaque within the common femoral artery which required endarterectomy.  This was performed on 07/24/2016.  His operation was done for severe claudication, bordering on rest pain.  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  His preoperative ABI was 0.3.  He is here today complaining of swelling in some numbness in his leg.  He denies any drainage.  He feels his walking is better and the preoperative pain is no longer present.  CURRENT MEDICATIONS:    Current Outpatient Prescriptions  Medication Sig Dispense Refill  . ALPRAZolam (XANAX) 1 MG tablet Take 1/2-1 tablet 2-3 times daily for anxiety. Try to minimize use and skip doses when possible. (Patient taking differently: Take 0.5-1 mg by mouth 3 (three) times daily as needed for anxiety. ) 90 tablet 2  . amitriptyline (ELAVIL) 50 MG tablet Take 1 tablet (50 mg total) by mouth at bedtime. 60 tablet 6  . aspirin EC 81 MG tablet Take 81 mg by mouth daily.    . clopidogrel (PLAVIX) 75 MG tablet Take 1 tablet (75 mg total) by mouth daily. 30 tablet 11  . cyclobenzaprine (FLEXERIL) 10 MG tablet Take 1 tablet (10 mg total) by mouth 3 (three) times daily. (Patient not taking: Reported on 07/21/2016) 90 tablet 5  . diclofenac (VOLTAREN) 75 MG EC tablet Take 75 mg by mouth 2 (two) times daily as needed for mild pain.     Marland Kitchen lovastatin (MEVACOR) 20 MG tablet Take 1 tablet (20 mg total) by mouth at bedtime. 90 tablet 3  . oxyCODONE-acetaminophen (PERCOCET/ROXICET) 5-325 MG tablet Take 1 tablet by mouth every 6 (six) hours as needed for moderate  pain. 30 tablet 0   No current facility-administered medications for this visit.     REVIEW OF SYSTEMS:   [X]  denotes positive finding, [ ]  denotes negative finding Cardiac  Comments:  Chest pain or chest pressure:    Shortness of breath upon exertion:    Short of breath when lying flat:    Irregular heart rhythm:    Constitutional    Fever or chills:      PHYSICAL EXAM:   There were no vitals filed for this visit.  GENERAL: The patient is a well-nourished male, in no acute distress. The vital signs are documented above. CARDIOVASCULAR: There is a regular rate and rhythm. PULMONARY: Non-labored respirations Palpable dorsalis pedis pulse All incisions are healing nicely.  I did remove a piece of suture from the distal incision.  STUDIES:   None   MEDICAL ISSUES:   Status post right femoral to above-knee popliteal bypass graft with vein for severe claudication.  The patient is recovering nicely.  His driving restrictions are complete.  He is still having limitations with ambulation secondary to postoperative pain.  I suspect these will resolve over the course of the next several months.  I have encouraged him to keep his leg elevated.  I offered him compression stockings to help with swelling, however his swelling is not that significant and he is going to defer getting these.  I haven't scheduled for follow-up in 3 months with  a duplex and ABIs.  [name], MD [facility] [phone] Pager [phone]

## 2016-08-25 ENCOUNTER — Encounter: Payer: Self-pay | Admitting: Family

## 2016-08-25 ENCOUNTER — Telehealth: Payer: Self-pay | Admitting: *Deleted

## 2016-08-25 NOTE — Telephone Encounter (Signed)
Wife called stating that patient is complaining of increased swelling to right leg, soreness and is limping worse now than after surgery.  When asked if he was elevating leg she states that he has been.  She states that he feels that he is not improving.  I scheduled an appointment 08/26/2016 with NP.

## 2016-08-26 ENCOUNTER — Encounter: Payer: Self-pay | Admitting: Family

## 2016-08-26 ENCOUNTER — Ambulatory Visit (INDEPENDENT_AMBULATORY_CARE_PROVIDER_SITE_OTHER): Payer: Self-pay | Admitting: Family

## 2016-08-26 VITALS — BP 140/75 | HR 94 | Temp 98.4°F | Resp 20 | Ht 69.0 in | Wt 138.6 lb

## 2016-08-26 DIAGNOSIS — R609 Edema, unspecified: Secondary | ICD-10-CM

## 2016-08-26 DIAGNOSIS — M48061 Spinal stenosis, lumbar region without neurogenic claudication: Secondary | ICD-10-CM | POA: Diagnosis not present

## 2016-08-26 DIAGNOSIS — Z959 Presence of cardiac and vascular implant and graft, unspecified: Secondary | ICD-10-CM

## 2016-08-26 DIAGNOSIS — I70211 Atherosclerosis of native arteries of extremities with intermittent claudication, right leg: Secondary | ICD-10-CM

## 2016-08-26 DIAGNOSIS — M5126 Other intervertebral disc displacement, lumbar region: Secondary | ICD-10-CM | POA: Diagnosis not present

## 2016-08-26 DIAGNOSIS — R2681 Unsteadiness on feet: Secondary | ICD-10-CM

## 2016-08-26 DIAGNOSIS — M4802 Spinal stenosis, cervical region: Secondary | ICD-10-CM | POA: Diagnosis not present

## 2016-08-26 NOTE — Patient Instructions (Addendum)
To decrease swelling in your foot and leg: Elevate feet above slightly bent knees, feet above heart, overnight and 3-4 times per day for 20 minutes.     Steps to Quit Smoking Smoking tobacco can be bad for your health. It can also affect almost every organ in your body. Smoking puts you and people around you at risk for many serious long-lasting (chronic) diseases. Quitting smoking is hard, but it is one of the best things that you can do for your health. It is never too late to quit. What are the benefits of quitting smoking? When you quit smoking, you lower your risk for getting serious diseases and conditions. They can include:  Lung cancer or lung disease.  Heart disease.  Stroke.  Heart attack.  Not being able to have children (infertility).  Weak bones (osteoporosis) and broken bones (fractures).  If you have coughing, wheezing, and shortness of breath, those symptoms may get better when you quit. You may also get sick less often. If you are pregnant, quitting smoking can help to lower your chances of having a baby of low birth weight. What can I do to help me quit smoking? Talk with your doctor about what can help you quit smoking. Some things you can do (strategies) include:  Quitting smoking totally, instead of slowly cutting back how much you smoke over a period of time.  Going to in-person counseling. You are more likely to quit if you go to many counseling sessions.  Using resources and support systems, such as: ? Database administrator with a Social worker. ? Phone quitlines. ? Careers information officer. ? Support groups or group counseling. ? Text messaging programs. ? Mobile phone apps or applications.  Taking medicines. Some of these medicines may have nicotine in them. If you are pregnant or breastfeeding, do not take any medicines to quit smoking unless your doctor says it is okay. Talk with your doctor about counseling or other things that can help you.  Talk with  your doctor about using more than one strategy at the same time, such as taking medicines while you are also going to in-person counseling. This can help make quitting easier. What things can I do to make it easier to quit? Quitting smoking might feel very hard at first, but there is a lot that you can do to make it easier. Take these steps:  Talk to your family and friends. Ask them to support and encourage you.  Call phone quitlines, reach out to support groups, or work with a Social worker.  Ask people who smoke to not smoke around you.  Avoid places that make you want (trigger) to smoke, such as: ? Bars. ? Parties. ? Smoke-break areas at work.  Spend time with people who do not smoke.  Lower the stress in your life. Stress can make you want to smoke. Try these things to help your stress: ? Getting regular exercise. ? Deep-breathing exercises. ? Yoga. ? Meditating. ? Doing a body scan. To do this, close your eyes, focus on one area of your body at a time from head to toe, and notice which parts of your body are tense. Try to relax the muscles in those areas.  Download or buy apps on your mobile phone or tablet that can help you stick to your quit plan. There are many free apps, such as QuitGuide from the [employer] Office manager for Disease Control and Prevention). You can find more support from smokefree.gov and other websites.  This information is  not intended to replace advice given to you by your health care provider. Make sure you discuss any questions you have with your health care provider. Document Released: 11/08/2008 Document Revised: 09/10/2015 Document Reviewed: 05/29/2014 Elsevier Interactive Patient Education  2018 Elsevier Inc.     Peripheral Vascular Disease Peripheral vascular disease (PVD) is a disease of the blood vessels that are not part of your heart and brain. A simple term for PVD is poor circulation. In most cases, PVD narrows the blood vessels that carry blood from  your heart to the rest of your body. This can result in a decreased supply of blood to your arms, legs, and internal organs, like your stomach or kidneys. However, it most often affects a person's lower legs and feet. There are two types of PVD.  Organic PVD. This is the more common type. It is caused by damage to the structure of blood vessels.  Functional PVD. This is caused by conditions that make blood vessels contract and tighten (spasm).  Without treatment, PVD tends to get worse over time. PVD can also lead to acute ischemic limb. This is when an arm or limb suddenly has trouble getting enough blood. This is a medical emergency. Follow these instructions at home:  Take medicines only as told by your doctor.  Do not use any tobacco products, including cigarettes, chewing tobacco, or electronic cigarettes. If you need help quitting, ask your doctor.  Lose weight if you are overweight, and maintain a healthy weight as told by your doctor.  Eat a diet that is low in fat and cholesterol. If you need help, ask your doctor.  Exercise regularly. Ask your doctor for some good activities for you.  Take good care of your feet. ? Wear comfortable shoes that fit well. ? Check your feet often for any cuts or sores. Contact a doctor if:  You have cramps in your legs while walking.  You have leg pain when you are at rest.  You have coldness in a leg or foot.  Your skin changes.  You are unable to get or have an erection (erectile dysfunction).  You have cuts or sores on your feet that are not healing. Get help right away if:  Your arm or leg turns cold and blue.  Your arms or legs become red, warm, swollen, painful, or numb.  You have chest pain or trouble breathing.  You suddenly have weakness in your face, arm, or leg.  You become very confused or you cannot speak.  You suddenly have a very bad headache.  You suddenly cannot see. This information is not intended to  replace advice given to you by your health care provider. Make sure you discuss any questions you have with your health care provider. Document Released: 04/08/2009 Document Revised: 06/20/2015 Document Reviewed: 06/22/2013 Elsevier Interactive Patient Education  2017 Elsevier Inc.  

## 2016-08-26 NOTE — Progress Notes (Signed)
Postoperative Visit   History of Present Illness  Roy Moore is a 61 y.o. year old male who is status post right femoral to above-knee popliteal artery bypass graft with ipsilateral non-reversed greater saphenous vein.  He had heavily calcified plaque within the common femoral artery which required endarterectomy.  This was performed on 07/24/2016.  His operation was done for severe claudication, bordering on rest pain.  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  His preoperative ABI was 0.3.  Dr. Trula Slade last evaluated pt on 08-19-16. At that time there was a palpable dorsalis pedis pulse. All incisions were healing nicely. Dr. Trula Slade removed a piece of suture from the distal incision. The patient was recovering nicely.  His driving restrictions were complete.  He was still having limitations with ambulation secondary to postoperative pain wich Dr. Trula Slade suspected will resolve over the course of the next several months. Dr. Trula Slade encouraged pt to keep his leg elevated and offered him compression stockings to help with swelling, however his swelling is not that significant and he is going to defer getting these. Pt was advised to return in 3 months with a duplex and ABIs.  Pt returns today after wife called stating that patient is complaining of increased swelling to right foot, soreness, and is limping worse now than after surgery.  When asked if he was elevating leg she states that he has been, but not above his heart.  She states that he feels that he is not improving.  When asked whether his right leg symptoms that he had before surgery had changed, he stated that he no longer has right thigh or calf pain with walking. He and wife state that all of his right lower extremity incisions have healed well.   He does not have DM but does smoke less than 1/2 ppd, states he has decreased from a ppd.  The patient is able to complete their activities of daily living.    For VQI Use  Only  PRE-ADM LIVING: Home  AMB STATUS: Ambulatory with cane, slow and deliberate, unsteady getting off the exam table   Past Medical History:  Diagnosis Date  . Anxiety   . Complication of anesthesia   . Hyperlipidemia   . Peripheral vascular disease ([place])   . PONV (postoperative nausea and vomiting)   . Stroke [facility]) 2009   no residual    Past Surgical History:  Procedure Laterality Date  . ABDOMINAL AORTOGRAM W/LOWER EXTREMITY [name] 06/09/2016   Procedure: Abdominal Aortogram w/Lower Extremity;  Surgeon: Serafina Mitchell, MD;  Location: [place] [facility];  Service: Cardiovascular;  Laterality: [name];  . BACK SURGERY     10 yrs ago or so-fusion in neck   . CERVICAL FUSION    . COLONOSCOPY     15-20 yrs ago-normal per pt.   . COLONOSCOPY W/ POLYPECTOMY    . CYSTOSCOPY     kidney stools removed   . FEMORAL-POPLITEAL BYPASS GRAFT Right 07/24/2016   Procedure: RIGHT FEMORAL TO ABOVE KNEE POPLITEAL ARTERY BYPASS GRAFT;  Surgeon: Serafina Mitchell, MD;  Location: [place];  Service: Vascular;  Laterality: Right;  . LUMBAR LAMINECTOMY/DECOMPRESSION MICRODISCECTOMY Bilateral 12/03/2015   Procedure: Microdiscectomy - bilateral - Lumbar four-lumbar five;  Surgeon: Earnie Larsson, MD;  Location: [place];  Service: Neurosurgery;  Laterality: Bilateral;  . PERIPHERAL VASCULAR INTERVENTION Right 06/09/2016   Procedure: Peripheral Vascular Intervention;  Surgeon: Serafina Mitchell, MD;  Location: [place] [facility];  Service: Cardiovascular;  Laterality: Right;  SFA  . teeth removal      Social History   Social History  . Marital status: Married    Spouse name: [name]  . Number of children: [name]  . Years of education: [name]   Occupational History  . Not on file.   Social History Main Topics  . Smoking status: Current Every Day Smoker    Packs/day: 1.00    Years: 47.00    Types: Cigarettes  . Smokeless tobacco: Never Used  . Alcohol use No     Comment: no alcohol in 23 years (12/02/15  . Drug use:  No  . Sexual activity: Not on file   Other Topics Concern  . Not on file   Social History Narrative  . No narrative on file    Allergies  Allergen Reactions  . No Known Allergies     Current Outpatient Prescriptions on File Prior to Visit  Medication Sig Dispense Refill  . ALPRAZolam (XANAX) 1 MG tablet Take 1/2-1 tablet 2-3 times daily for anxiety. Try to minimize use and skip doses when possible. (Patient taking differently: Take 0.5-1 mg by mouth 3 (three) times daily as needed for anxiety. ) 90 tablet 2  . amitriptyline (ELAVIL) 50 MG tablet Take 1 tablet (50 mg total) by mouth at bedtime. 60 tablet 6  . aspirin EC 81 MG tablet Take 81 mg by mouth daily.    . clopidogrel (PLAVIX) 75 MG tablet Take 1 tablet (75 mg total) by mouth daily. 30 tablet 11  . diclofenac (VOLTAREN) 75 MG EC tablet Take 75 mg by mouth 2 (two) times daily as needed for mild pain.     Marland Kitchen lovastatin (MEVACOR) 20 MG tablet Take 1 tablet (20 mg total) by mouth at bedtime. 90 tablet 3  . oxyCODONE-acetaminophen (PERCOCET/ROXICET) 5-325 MG tablet Take 1 tablet by mouth every 6 (six) hours as needed for moderate pain. 30 tablet 0  . cyclobenzaprine (FLEXERIL) 10 MG tablet Take 1 tablet (10 mg total) by mouth 3 (three) times daily. (Patient not taking: Reported on 07/21/2016) 90 tablet 5   No current facility-administered medications on file prior to visit.      Physical Examination  Vitals:   08/26/16 1507  BP: 140/75  Pulse: 94  Resp: 20  Temp: 98.4 F (36.9 C)  TempSrc: Oral  SpO2: 97%  Weight: 138 lb 9.6 oz (62.9 kg)  Height: 5\' 9"  (1.753 m)   Body mass index is 20.47 kg/m.  Right groin and more distal right leg incisions are well healed.  2+ palpable right PT and DP pulses. Right foot with 1+ non pitting edema, no wounds, no ulcers.   Medical Decision Making  Roy Moore is a 61 y.o. year old male who presents s/p right femoral to above-knee popliteal artery bypass graft with ipsilateral  non-reversed greater saphenous vein on 07/24/2016. Pt has reperfusion swelling and edema in right foot; discovered that he is not elevating his feet above his heart. I instructed him and his wife how to elevate his feet to reduce foot swelling, see Patient Instructions.   The patient's bypass incisions are healing appropriately with resolution of pre-operative symptoms.  The patient was counseled re smoking cessation and given several free resources re smoking cessation.   Pt has an appointment on 11-23-16 for testing and to see Dr. Trula Slade.   Wife requested that pt receive more physical therapy; she indicated that PT came to their home only 3  times, and that pt needs more help to strengthen his gait, states he is unsteady on his feet; will renew referral to PT for unsteady gait. His walking in the clinic seemed unsteady.    I discussed in depth with the patient the nature of atherosclerosis, and emphasized the importance of maximal medical management including strict control of blood pressure, blood glucose, and lipid levels, obtaining regular exercise, and cessation of smoking.  The patient is aware that without maximal medical management the underlying atherosclerotic disease process will progress, limiting the benefit of any interventions.  Thank you for allowing [place] to participate in this patient's care.  Alfonzo Arca, Sharmon Leyden, RN, MSN, FNP-C [facility] [place] Office: [phone]  08/26/2016, 3:25 PM  Clinic MD: Ddickson/Cain

## 2016-08-28 NOTE — Addendum Note (Signed)
Addended by: Lianne Cure A on: 08/28/2016 09:45 AM   Modules accepted: Orders

## 2016-09-18 ENCOUNTER — Ambulatory Visit (INDEPENDENT_AMBULATORY_CARE_PROVIDER_SITE_OTHER): Payer: Medicare HMO

## 2016-09-18 VITALS — BP 159/78 | HR 97 | Temp 97.9°F | Ht 69.0 in | Wt 135.2 lb

## 2016-09-18 DIAGNOSIS — Z0000 Encounter for general adult medical examination without abnormal findings: Secondary | ICD-10-CM

## 2016-09-18 NOTE — Patient Instructions (Addendum)
Roy Moore , Thank you for taking time to come for your Medicare Wellness Visit. I appreciate your ongoing commitment to your health goals. Please review the following plan we discussed and let me know if I can assist you in the future.   Screening recommendations/referrals: Colonoscopy: completed 12/13/2013 Recommended yearly ophthalmology/optometry visit for glaucoma screening and checkup Recommended yearly dental visit for hygiene and checkup  Vaccinations: Influenza vaccine: declined Pneumococcal vaccine: declined, Two doses after the age of 57; if you are younger than 48, ask your healthcare provider if you need a Pneumonia shot. Tdap vaccine: completed 01/21/2014 Shingles vaccine: declined  Advanced directives: Advance directive discussed with you today. Even though you declined this today please call our office should you change your mind and we can give you the proper paperwork for you to fill out.  Conditions/risks identified: Patient states that he is going to try to decrease smoking daily until he completely quits.   Next appointment: schedule follow up with PCP and 1 year for next AWV  Preventive Care 40-64 Years, Male Preventive care refers to lifestyle choices and visits with your health care provider that can promote health and wellness. What does preventive care include?  A yearly physical exam. This is also called an annual well check.  Dental exams once or twice a year.  Routine eye exams. Ask your health care provider how often you should have your eyes checked.  Personal lifestyle choices, including:  Daily care of your teeth and gums.  Regular physical activity.  Eating a healthy diet.  Avoiding tobacco and drug use.  Limiting alcohol use.  Practicing safe sex.  Taking low-dose aspirin every day starting at age 17. What happens during an annual well check? The services and screenings done by your health care provider during your annual well check  will depend on your age, overall health, lifestyle risk factors, and family history of disease. Counseling  Your health care provider may ask you questions about your:  Alcohol use.  Tobacco use.  Drug use.  Emotional well-being.  Home and relationship well-being.  Sexual activity.  Eating habits.  Work and work Statistician. Screening  You may have the following tests or measurements:  Height, weight, and BMI.  Blood pressure.  Lipid and cholesterol levels. These may be checked every 5 years, or more frequently if you are over 37 years old.  Skin check.  Lung cancer screening. You may have this screening every year starting at age 78 if you have a 30-pack-year history of smoking and currently smoke or have quit within the past 15 years.  Fecal occult blood test (FOBT) of the stool. You may have this test every year starting at age 35.  Flexible sigmoidoscopy or colonoscopy. You may have a sigmoidoscopy every 5 years or a colonoscopy every 10 years starting at age 5.  Prostate cancer screening. Recommendations will vary depending on your family history and other risks.  Hepatitis C blood test.  Hepatitis B blood test.  Sexually transmitted disease (STD) testing.  Diabetes screening. This is done by checking your blood sugar (glucose) after you have not eaten for a while (fasting). You may have this done every 1-3 years. Discuss your test results, treatment options, and if necessary, the need for more tests with your health care provider. Vaccines  Your health care provider may recommend certain vaccines, such as:  Influenza vaccine. This is recommended every year.  Tetanus, diphtheria, and acellular pertussis (Tdap, Td) vaccine. You may need a  Td booster every 10 years.  Zoster vaccine. You may need this after age 75.  Pneumococcal 13-valent conjugate (PCV13) vaccine. You may need this if you have certain conditions and have not been vaccinated.  Pneumococcal  polysaccharide (PPSV23) vaccine. You may need one or two doses if you smoke cigarettes or if you have certain conditions. Talk to your health care provider about which screenings and vaccines you need and how often you need them. This information is not intended to replace advice given to you by your health care provider. Make sure you discuss any questions you have with your health care provider. Document Released: 02/08/2015 Document Revised: 10/02/2015 Document Reviewed: 11/13/2014 Elsevier Interactive Patient Education  2017 [place] Prevention in the Home Falls can cause injuries. They can happen to people of all ages. There are many things you can do to make your home safe and to help prevent falls. What can I do on the outside of my home?  Regularly fix the edges of walkways and driveways and fix any cracks.  Remove anything that might make you trip as you walk through a door, such as a raised step or threshold.  Trim any bushes or trees on the path to your home.  Use bright outdoor lighting.  Clear any walking paths of anything that might make someone trip, such as rocks or tools.  Regularly check to see if handrails are loose or broken. Make sure that both sides of any steps have handrails.  Any raised decks and porches should have guardrails on the edges.  Have any leaves, snow, or ice cleared regularly.  Use sand or salt on walking paths during winter.  Clean up any spills in your garage right away. This includes oil or grease spills. What can I do in the bathroom?  Use night lights.  Install grab bars by the toilet and in the tub and shower. Do not use towel bars as grab bars.  Use non-skid mats or decals in the tub or shower.  If you need to sit down in the shower, use a plastic, non-slip stool.  Keep the floor dry. Clean up any water that spills on the floor as soon as it happens.  Remove soap buildup in the tub or shower regularly.  Attach bath  mats securely with double-sided non-slip rug tape.  Do not have throw rugs and other things on the floor that can make you trip. What can I do in the bedroom?  Use night lights.  Make sure that you have a light by your bed that is easy to reach.  Do not use any sheets or blankets that are too big for your bed. They should not hang down onto the floor.  Have a firm chair that has side arms. You can use this for support while you get dressed.  Do not have throw rugs and other things on the floor that can make you trip. What can I do in the kitchen?  Clean up any spills right away.  Avoid walking on wet floors.  Keep items that you use a lot in easy-to-reach places.  If you need to reach something above you, use a strong step stool that has a grab bar.  Keep electrical cords out of the way.  Do not use floor polish or wax that makes floors slippery. If you must use wax, use non-skid floor wax.  Do not have throw rugs and other things on the floor that can make you  trip. What can I do with my stairs?  Do not leave any items on the stairs.  Make sure that there are handrails on both sides of the stairs and use them. Fix handrails that are broken or loose. Make sure that handrails are as long as the stairways.  Check any carpeting to make sure that it is firmly attached to the stairs. Fix any carpet that is loose or worn.  Avoid having throw rugs at the top or bottom of the stairs. If you do have throw rugs, attach them to the floor with carpet tape.  Make sure that you have a light switch at the top of the stairs and the bottom of the stairs. If you do not have them, ask someone to add them for you. What else can I do to help prevent falls?  Wear shoes that:  Do not have high heels.  Have rubber bottoms.  Are comfortable and fit you well.  Are closed at the toe. Do not wear sandals.  If you use a stepladder:  Make sure that it is fully opened. Do not climb a closed  stepladder.  Make sure that both sides of the stepladder are locked into place.  Ask someone to hold it for you, if possible.  Clearly mark and make sure that you can see:  Any grab bars or handrails.  First and last steps.  Where the edge of each step is.  Use tools that help you move around (mobility aids) if they are needed. These include:  Canes.  Walkers.  Scooters.  Crutches.  Turn on the lights when you go into a dark area. Replace any light bulbs as soon as they burn out.  Set up your furniture so you have a clear path. Avoid moving your furniture around.  If any of your floors are uneven, fix them.  If there are any pets around you, be aware of where they are.  Review your medicines with your doctor. Some medicines can make you feel dizzy. This can increase your chance of falling. Ask your doctor what other things that you can do to help prevent falls. This information is not intended to replace advice given to you by your health care provider. Make sure you discuss any questions you have with your health care provider. Document Released: 11/08/2008 Document Revised: 06/20/2015 Document Reviewed: 02/16/2014 Elsevier Interactive Patient Education  2017 [employer].

## 2016-09-18 NOTE — Progress Notes (Signed)
Subjective:   Roy Moore is a 61 y.o. male who presents for Medicare Annual/Subsequent preventive examination.  Review of Systems:  [name] Cardiac Risk Factors include: advanced age (>45men, >107 women);male gender;hypertension;dyslipidemia;smoking/ tobacco exposure     Objective:    Vitals: BP (!) 159/78   Pulse 97   Temp 97.9 F (36.6 C) (Oral)   Ht 5\' 9"  (1.753 m)   Wt 135 lb 4 oz (61.3 kg)   BMI 19.97 kg/m   Body mass index is 19.97 kg/m.  Tobacco  History  Smoking Status  . Current Every Day Smoker  . Packs/day: 0.50  . Years: 47.00  . Types: Cigarettes  Smokeless Tobacco  . Never Used     Ready to quit: No Counseling given: Not Answered   Past Medical History:  Diagnosis Date  . Anxiety   . Complication of anesthesia   . Hyperlipidemia   . Peripheral vascular disease ([place])   . PONV (postoperative nausea and vomiting)   . Stroke [facility] 2009   no residual   Past Surgical History:  Procedure Laterality Date  . ABDOMINAL AORTOGRAM W/LOWER EXTREMITY [name] 06/09/2016   Procedure: Abdominal Aortogram w/Lower Extremity;  Surgeon: Serafina Mitchell, MD;  Location: [place] [facility];  Service: Cardiovascular;  Laterality: [name];  . BACK SURGERY     10 yrs ago or so-fusion in neck   . CERVICAL FUSION    . COLONOSCOPY     15-20 yrs ago-normal per pt.   . COLONOSCOPY W/ POLYPECTOMY    . CYSTOSCOPY     kidney stools removed   . FEMORAL-POPLITEAL BYPASS GRAFT Right 07/24/2016   Procedure: RIGHT FEMORAL TO ABOVE KNEE POPLITEAL ARTERY BYPASS GRAFT;  Surgeon: Serafina Mitchell, MD;  Location: [place];  Service: Vascular;  Laterality: Right;  . LUMBAR LAMINECTOMY/DECOMPRESSION MICRODISCECTOMY Bilateral 12/03/2015   Procedure: Microdiscectomy - bilateral - Lumbar four-lumbar five;  Surgeon: Earnie Larsson, MD;  Location: [place];  Service: Neurosurgery;  Laterality: Bilateral;  . PERIPHERAL VASCULAR INTERVENTION Right 06/09/2016   Procedure: Peripheral Vascular Intervention;   Surgeon: Serafina Mitchell, MD;  Location: [place] [facility];  Service: Cardiovascular;  Laterality: Right;  SFA  . teeth removal     Family History  Problem Relation Age of Onset  . Colon cancer Neg Hx   . Rectal cancer Neg Hx   . Stomach cancer Neg Hx   . Esophageal cancer Neg Hx    History  Sexual Activity  . Sexual activity: Not on file    Outpatient Encounter Prescriptions as of 09/18/2016  Medication Sig  . ALPRAZolam (XANAX) 1 MG tablet Take 1/2-1 tablet 2-3 times daily for anxiety. Try to minimize use and skip doses when possible. (Patient taking differently: Take 0.5-1 mg by mouth 3 (three) times daily as needed for anxiety. )  . amitriptyline (ELAVIL) 50 MG tablet Take 1 tablet (50 mg total) by mouth at bedtime.  . clopidogrel (PLAVIX) 75 MG tablet Take 1 tablet (75 mg total) by mouth daily.  Marland Kitchen lovastatin (MEVACOR) 20 MG tablet Take 1 tablet (20 mg total) by mouth at bedtime.  Marland Kitchen oxyCODONE-acetaminophen (PERCOCET/ROXICET) 5-325 MG tablet Take 1 tablet by mouth every 6 (six) hours as needed for moderate pain.  . [DISCONTINUED] aspirin EC 81 MG tablet Take 81 mg by mouth daily.  . [DISCONTINUED] cyclobenzaprine (FLEXERIL) 10 MG tablet Take 1 tablet (10 mg total) by mouth 3 (three) times daily. (Patient not taking: Reported on 07/21/2016)  . [DISCONTINUED]  diclofenac (VOLTAREN) 75 MG EC tablet Take 75 mg by mouth 2 (two) times daily as needed for mild pain.    No facility-administered encounter medications on file as of 09/18/2016.     Activities of Daily Living In your present state of health, do you have any difficulty performing the following activities: 09/18/2016 07/24/2016  Hearing? N N  Vision? N N  Difficulty concentrating or making decisions? N N  Walking or climbing stairs? Y Y  Comment Patient has a weak leg.  -  Dressing or bathing? N N  Doing errands, shopping? N N  Preparing Food and eating ? N -  Using the Toilet? N -  In the past six months, have you  accidently leaked urine? N -  Do you have problems with loss of bowel control? N -  Managing your Medications? N -  Managing your Finances? N -  Housekeeping or managing your Housekeeping? N -  Some recent data might be hidden    Patient Care Team: [name], MD as PCP - General (Family Medicine) Earnie Larsson, MD as Consulting Physician (Neurosurgery) Serafina Mitchell, MD as Consulting Physician (Vascular Surgery)   Assessment:     Exercise Activities and Dietary recommendations Current Exercise Habits: The patient does not participate in regular exercise at present (Patient had heart surgery and can't exercise due to doctors recommendations. ), Exercise limited by: cardiac condition(s)  Goals    . Quit smoking / using tobacco          Patient states that he is going to try to decrease smoking daily until he completely quits.       Fall Risk Fall Risk  09/18/2016 01/22/2016 11/28/2015 10/03/2015 08/07/2015  Falls in the past year? Yes No No No No  Number falls in past yr: 2 or more - - - -  Injury with Fall? No - - - -  Risk Factor Category  High Fall Risk - - - -  Follow up Falls prevention discussed - - - -   Depression Screen PHQ 2/9 Scores 09/18/2016 01/22/2016 11/28/2015 10/03/2015  PHQ - 2 Score 3 0 0 0  PHQ- 9 Score 6 - - -  Exception Documentation - - - -    Cognitive Function     6CIT Screen 09/18/2016  What Year? 0 points  What month? 0 points  What time? 0 points  Count back from 20 0 points  Months in reverse 4 points  Repeat phrase 6 points  Total Score 10    Immunization History  Administered Date(s) Administered  . Tdap 01/21/2014   Screening Tests Health Maintenance  Topic Date Due  . INFLUENZA VACCINE  01/25/2017 (Originally 08/26/2016)  . COLONOSCOPY  12/13/2016  . TETANUS/TDAP  01/22/2024  . Hepatitis C Screening  Completed  . HIV Screening  Completed      Plan:  I have personally reviewed and addressed the Medicare Annual Wellness  questionnaire and have noted the following in the patient's chart:  A. Medical and social history B. Use of alcohol, tobacco or illicit drugs  C. Current medications and supplements D. Functional ability and status E.  Nutritional status F.  Physical activity G. Advance directives H. List of other physicians I.  Hospitalizations, surgeries, and ER visits in previous 12 months J.  [place] such as hearing and vision if needed, cognitive and depression L. Referrals and appointments - none  In addition, I have reviewed and discussed with patient certain preventive  protocols, quality metrics, and best practice recommendations. A written personalized care plan for preventive services as well as general preventive health recommendations were provided to patient.    Signed,  Andrez Grime, LPN Nurse Health Advisor   MD Recommendations: PHQ9 score- 6, 6CIT score- 10

## 2016-09-21 ENCOUNTER — Ambulatory Visit: Payer: Medicare HMO | Admitting: Family Medicine

## 2016-09-22 ENCOUNTER — Encounter: Payer: Self-pay | Admitting: Family Medicine

## 2016-09-22 ENCOUNTER — Ambulatory Visit (INDEPENDENT_AMBULATORY_CARE_PROVIDER_SITE_OTHER): Payer: Medicare HMO | Admitting: Family Medicine

## 2016-09-22 VITALS — BP 137/69 | HR 90 | Temp 98.4°F | Resp 16 | Ht 69.0 in | Wt 132.0 lb

## 2016-09-22 DIAGNOSIS — I739 Peripheral vascular disease, unspecified: Secondary | ICD-10-CM | POA: Diagnosis not present

## 2016-09-22 DIAGNOSIS — R739 Hyperglycemia, unspecified: Secondary | ICD-10-CM

## 2016-09-22 DIAGNOSIS — F411 Generalized anxiety disorder: Secondary | ICD-10-CM | POA: Diagnosis not present

## 2016-09-22 DIAGNOSIS — Z9189 Other specified personal risk factors, not elsewhere classified: Secondary | ICD-10-CM | POA: Diagnosis not present

## 2016-09-22 DIAGNOSIS — E785 Hyperlipidemia, unspecified: Secondary | ICD-10-CM | POA: Diagnosis not present

## 2016-09-22 DIAGNOSIS — F4323 Adjustment disorder with mixed anxiety and depressed mood: Secondary | ICD-10-CM

## 2016-09-22 DIAGNOSIS — R634 Abnormal weight loss: Secondary | ICD-10-CM

## 2016-09-22 DIAGNOSIS — G4700 Insomnia, unspecified: Secondary | ICD-10-CM | POA: Diagnosis not present

## 2016-09-22 DIAGNOSIS — Z720 Tobacco use: Secondary | ICD-10-CM

## 2016-09-22 MED ORDER — AMITRIPTYLINE HCL 50 MG PO TABS: 50.0000 mg | ORAL_TABLET | Freq: Every day | ORAL | 6 refills | Status: DC

## 2016-09-22 MED ORDER — LOVASTATIN 20 MG PO TABS: 20.0000 mg | ORAL_TABLET | Freq: Every day | ORAL | 1 refills | Status: DC

## 2016-09-22 MED ORDER — ALPRAZOLAM 1 MG PO TABS: 0.5000 mg | ORAL_TABLET | Freq: Three times a day (TID) | ORAL | 0 refills | Status: DC | PRN

## 2016-09-22 NOTE — Patient Instructions (Addendum)
I will check your cholesterol level today, but if elevated - will need to repeat test after fasting for 8 hours.   Blood sugar has been elevated in past, and recently. I will check a 3 month blood sugar test to screen for diabetes today.   I can refill Xanax for now - try 1/2 pill initially, and only as needed. You can NOT take xanax if you are taking percocet as the 2 medicines can cause accidental overdose. I would recommend you try a less addictive medicine for anxiety as we discussed in past, but can talk about this medicine further at future visit.   I will refer you to neurology for other memory testing as the screening test did indicate some possible memory issues.  Continue to decrease smoking. [facility] offers smoking cessation clinics. Registration is required. To register call [phone] or register online at [email or web address]   If you have other concerns that were not able to be addressed today, please make a list so we can discuss those at a future visit. I will check some bloodwork, but would like to discuss weight loss further at next visit within the next 2 weeks.   Return to the clinic or go to the nearest emergency room if any of your symptoms worsen or new symptoms occur.   Steps to Quit Smoking Smoking tobacco can be harmful to your health and can affect almost every organ in your body. Smoking puts you, and those around you, at risk for developing many serious chronic diseases. Quitting smoking is difficult, but it is one of the best things that you can do for your health. It is never too late to quit. What are the benefits of quitting smoking? When you quit smoking, you lower your risk of developing serious diseases and conditions, such as:  Lung cancer or lung disease, such as COPD.  Heart disease.  Stroke.  Heart attack.  Infertility.  Osteoporosis and bone fractures.  Additionally, symptoms such as coughing, wheezing, and shortness of breath may get better  when you quit. You may also find that you get sick less often because your body is stronger at fighting off colds and infections. If you are pregnant, quitting smoking can help to reduce your chances of having a baby of low birth weight. How do I get ready to quit? When you decide to quit smoking, create a plan to make sure that you are successful. Before you quit:  Pick a date to quit. Set a date within the next two weeks to give you time to prepare.  Write down the reasons why you are quitting. Keep this list in places where you will see it often, such as on your bathroom mirror or in your car or wallet.  Identify the people, places, things, and activities that make you want to smoke (triggers) and avoid them. Make sure to take these actions: ? Throw away all cigarettes at home, at work, and in your car. ? Throw away smoking accessories, such as Scientist, research (medical). ? Clean your car and make sure to empty the ashtray. ? Clean your home, including curtains and carpets.  Tell your family, friends, and coworkers that you are quitting. Support from your loved ones can make quitting easier.  Talk with your health care provider about your options for quitting smoking.  Find out what treatment options are covered by your health insurance.  What strategies can I use to quit smoking? Talk with your healthcare provider about different strategies  to quit smoking. Some strategies include:  Quitting smoking altogether instead of gradually lessening how much you smoke over a period of time. Research shows that quitting "cold [place]" is more successful than gradually quitting.  Attending in-person counseling to help you build problem-solving skills. You are more likely to have success in quitting if you attend several counseling sessions. Even short sessions of 10 minutes can be effective.  Finding resources and support systems that can help you to quit smoking and remain smoke-free after you quit.  These resources are most helpful when you use them often. They can include: ? Online chats with a Social worker. ? Telephone quitlines. ? Careers information officer. ? Support groups or group counseling. ? Text messaging programs. ? Mobile phone applications.  Taking medicines to help you quit smoking. (If you are pregnant or breastfeeding, talk with your health care provider first.) Some medicines contain nicotine and some do not. Both types of medicines help with cravings, but the medicines that include nicotine help to relieve withdrawal symptoms. Your health care provider may recommend: ? Nicotine patches, gum, or lozenges. ? Nicotine inhalers or sprays. ? Non-nicotine medicine that is taken by mouth.  Talk with your health care provider about combining strategies, such as taking medicines while you are also receiving in-person counseling. Using these two strategies together makes you more likely to succeed in quitting than if you used either strategy on its own. If you are pregnant or breastfeeding, talk with your health care provider about finding counseling or other support strategies to quit smoking. Do not take medicine to help you quit smoking unless told to do so by your health care provider. What things can I do to make it easier to quit? Quitting smoking might feel overwhelming at first, but there is a lot that you can do to make it easier. Take these important actions:  Reach out to your family and friends and ask that they support and encourage you during this time. Call telephone quitlines, reach out to support groups, or work with a counselor for support.  Ask people who smoke to avoid smoking around you.  Avoid places that trigger you to smoke, such as bars, parties, or smoke-break areas at work.  Spend time around people who do not smoke.  Lessen stress in your life, because stress can be a smoking trigger for some people. To lessen stress, try: ? Exercising  regularly. ? Deep-breathing exercises. ? Yoga. ? Meditating. ? Performing a body scan. This involves closing your eyes, scanning your body from head to toe, and noticing which parts of your body are particularly tense. Purposefully relax the muscles in those areas.  Download or purchase mobile phone or tablet apps (applications) that can help you stick to your quit plan by providing reminders, tips, and encouragement. There are many free apps, such as QuitGuide from the [employer] Office manager for Disease Control and Prevention). You can find other support for quitting smoking (smoking cessation) through smokefree.gov and other websites.  How will I feel when I quit smoking? Within the first 24 hours of quitting smoking, you may start to feel some withdrawal symptoms. These symptoms are usually most noticeable 2-3 days after quitting, but they usually do not last beyond 2-3 weeks. Changes or symptoms that you might experience include:  Mood swings.  Restlessness, anxiety, or irritation.  Difficulty concentrating.  Dizziness.  Strong cravings for sugary foods in addition to nicotine.  Mild weight gain.  Constipation.  Nausea.  Coughing or a sore  throat.  Changes in how your medicines work in your body.  A depressed mood.  Difficulty sleeping (insomnia).  After the first 2-3 weeks of quitting, you may start to notice more positive results, such as:  Improved sense of smell and taste.  Decreased coughing and sore throat.  Slower heart rate.  Lower blood pressure.  Clearer skin.  The ability to breathe more easily.  Fewer sick days.  Quitting smoking is very challenging for most people. Do not get discouraged if you are not successful the first time. Some people need to make many attempts to quit before they achieve long-term success. Do your best to stick to your quit plan, and talk with your health care provider if you have any questions or concerns. This information is not  intended to replace advice given to you by your health care provider. Make sure you discuss any questions you have with your health care provider. Document Released: 01/06/2001 Document Revised: 09/10/2015 Document Reviewed: 05/29/2014 Elsevier Interactive Patient Education  2017 [employer].    IF you received an x-ray today, you will receive an invoice from [facility]. Please contact [facility] at [phone] with questions or concerns regarding your invoice.   IF you received labwork today, you will receive an invoice from [facility]. Please contact [facility] at [phone] with questions or concerns regarding your invoice.   Our billing staff will not be able to assist you with questions regarding bills from these companies.  You will be contacted with the lab results as soon as they are available. The fastest way to get your results is to activate your My Chart account. Instructions are located on the last page of this paperwork. If you have not heard from [place] regarding the results in 2 weeks, please contact this office.

## 2016-09-22 NOTE — Progress Notes (Signed)
Subjective:  By signing my name below, I, [name], attest that this documentation has been prepared under the direction and in the presence of [name], MD Electronically Signed: [name], ED Scribe 09/22/2016 at 2:23 PM.   Patient ID: Roy Moore, male    DOB: [birth year], 61 y.o.   MRN: [id number]  Chief Complaint  Patient presents with  . Follow-up    from welcome to medicare visit  . Medication Refill    all medications   HPI Roy Moore is a 61 y.o. male who presents to Primary Care at [facility] for follow-up. Last visit with me in November 2017 for CPE. H/o multiple med problems including CVA in 2009, residual L hemiparesis, recurrent low back pain (Dr. Trenton Gammon is neurosurgeon), hyperlipidemia, GERD, anxiety. Had a fem-pop bypass for R excluded superificial femoral artery that was previously stented in May. Had a medicare wellness exam 4 days ago with elevated PHQ 9 score and 6-CIT score of 10 for memory testing.   Hyperlipidemia Lovastatin 20 mg qd. Pt has been compliant with medication and denies myalgias, any other side effects. His last meal was around 9 AM today.   Lab Results  Component Value Date   CHOL 192 11/28/2015   HDL 45 11/28/2015   LDLCALC 126 11/28/2015   TRIG 104 11/28/2015   CHOLHDL 4.3 11/28/2015    Lab Results  Component Value Date   ALT 13 (L) 07/24/2016   AST 15 07/24/2016   ALKPHOS 104 07/24/2016   BILITOT 0.5 07/24/2016   Peripheral Vascular Disease S/p fem-pop bypass as above. Takes Plavix 75 mg qd.   Anxiety  See discussion iin November 2017. At that time he was taking Elavil 50 mg for insomnia and Xanax 1 mg tid for adjustment disorder, anxiety and depressed mood. We discussed SSRI instead of just scheduled benzo but he declined any change in medications. Was advised not to combine with narcotics and advised to follow-up to discuss further changes. Saw Dr. Linna Darner 12/27. Did discuss decreasing dose of Alprazolam. Was  given 90 Xanax with 2 refills 12/27. There was some concern about mental status testing at medicare wellness visit recently where he had difficulty with months in reverse and repeating phrases.   Pt takes Percocet once daily for leg pain. Reports increased stress and anxiety with finances, especially since running out Xanax a while ago which he was taking bid and occasionally tid. Denies taking Percocet and Xanax together. Pt used to smoke 1 ppd; currently down to 0.5 ppd. He has never tried quitting. Denies SI/HI, forgetfulness. He is still taking Elavil at night which he states is helping with sleep.   Pt also reports approximately 40 lbs weight loss which he attributes to increased stress related to surgeries. He has been drinking Boost, eating 3-4 meals daily as well as a snack at night.  Wt Readings from Last 3 Encounters:  09/22/16 132 lb (59.9 kg)  09/18/16 135 lb 4 oz (61.3 kg)  08/26/16 138 lb 9.6 oz (62.9 kg)   Depression screen [facility] 2/9 09/22/2016 09/18/2016 01/22/2016 11/28/2015 10/03/2015  Decreased Interest 0 0 0 0 0  Down, Depressed, Hopeless 0 3 0 0 0  PHQ - 2 Score 0 3 0 0 0  Altered sleeping - 0 - - -  Tired, decreased energy - 3 - - -  Change in appetite - 0 - - -  Feeling bad or failure about yourself  - 0 - - -  Trouble  concentrating - 0 - - -  Moving slowly or fidgety/restless - 0 - - -  Suicidal thoughts - 0 - - -  PHQ-9 Score - 6 - - -  Difficult doing work/chores - Somewhat difficult - - -   Hyperglycemia  Elevated at 143 in June. Denies being told he was pre-diabetic other than in this office.   Nasal Congestion Pt reports persistent nasal congestion for several years. States Dr. Joseph Art always told him he had fluid behind his ears. Denies fever, cough, hemoptysis, chest pain, difficulty breathing. Pt agrees to return at a later visit to discuss this concern.   Patient Active Problem List   Diagnosis Date Noted  . PAD (peripheral artery disease) ([place]) 07/24/2016   . Peripheral arterial disease ([place]) 07/17/2016  . Lumbar stenosis with neurogenic claudication 12/03/2015  . CVA, old, hemiparesis ([place]) 03/24/2011  . ANXIETY DISORDER 10/04/2007  . TOBACCO ABUSE 09/30/2007  . GERD 09/30/2007  . DYSLIPIDEMIA 09/15/2007  . STROKE WITH LEFT HEMIPARESIS 09/15/2007   Past Medical History:  Diagnosis Date  . Anxiety   . Complication of anesthesia   . Hyperlipidemia   . Peripheral vascular disease ([place])   . PONV (postoperative nausea and vomiting)   . Stroke [facility]) 2009   no residual   Past Surgical History:  Procedure Laterality Date  . ABDOMINAL AORTOGRAM W/LOWER EXTREMITY [name] 06/09/2016   Procedure: Abdominal Aortogram w/Lower Extremity;  Surgeon: Serafina Mitchell, MD;  Location: [place] [facility];  Service: Cardiovascular;  Laterality: [name];  . BACK SURGERY     10 yrs ago or so-fusion in neck   . CERVICAL FUSION    . COLONOSCOPY     15-20 yrs ago-normal per pt.   . COLONOSCOPY W/ POLYPECTOMY    . CYSTOSCOPY     kidney stools removed   . FEMORAL-POPLITEAL BYPASS GRAFT Right 07/24/2016   Procedure: RIGHT FEMORAL TO ABOVE KNEE POPLITEAL ARTERY BYPASS GRAFT;  Surgeon: Serafina Mitchell, MD;  Location: [place];  Service: Vascular;  Laterality: Right;  . LUMBAR LAMINECTOMY/DECOMPRESSION MICRODISCECTOMY Bilateral 12/03/2015   Procedure: Microdiscectomy - bilateral - Lumbar four-lumbar five;  Surgeon: Earnie Larsson, MD;  Location: [place];  Service: Neurosurgery;  Laterality: Bilateral;  . PERIPHERAL VASCULAR INTERVENTION Right 06/09/2016   Procedure: Peripheral Vascular Intervention;  Surgeon: Serafina Mitchell, MD;  Location: [place] [facility];  Service: Cardiovascular;  Laterality: Right;  SFA  . teeth removal     Allergies  Allergen Reactions  . No Known Allergies    Prior to Admission medications   Medication Sig Start Date End Date Taking? Authorizing Provider  ALPRAZolam [name]) 1 MG tablet Take 1/2-1 tablet 2-3 times daily for anxiety. Try to minimize  use and skip doses when possible. Patient taking differently: Take 0.5-1 mg by mouth 3 (three) times daily as needed for anxiety.  01/22/16  Yes Posey Boyer, MD  amitriptyline (ELAVIL) 50 MG tablet Take 1 tablet (50 mg total) by mouth at bedtime. 11/28/15  Yes [name], MD  clopidogrel (PLAVIX) 75 MG tablet Take 1 tablet (75 mg total) by mouth daily. 07/28/16  Yes Serafina Mitchell, MD  lovastatin (MEVACOR) 20 MG tablet Take 1 tablet (20 mg total) by mouth at bedtime. 11/28/15  Yes [name], MD  oxyCODONE-acetaminophen (PERCOCET/ROXICET) 5-325 MG tablet Take 1 tablet by mouth every 6 (six) hours as needed for moderate pain. 07/26/16  Yes Rhyne, Hulen Shouts, PA-C   Social History   Social History  .  Marital status: Married    Spouse name: [name]  . Number of children: [name]  . Years of education: [name]   Occupational History  . Not on file.   Social History Main Topics  . Smoking status: Current Every Day Smoker    Packs/day: 0.50    Years: 47.00    Types: Cigarettes  . Smokeless tobacco: Never Used  . Alcohol use No     Comment: no alcohol in 23 years (12/02/15  . Drug use: No  . Sexual activity: Not on file   Other Topics Concern  . Not on file   Social History Narrative  . No narrative on file   Review of Systems  Constitutional: Positive for unexpected weight change. Negative for fever.  HENT: Positive for congestion.   Respiratory: Negative for cough and shortness of breath.   Cardiovascular: Negative for chest pain.  Musculoskeletal: Negative for myalgias.  Psychiatric/Behavioral: Negative for decreased concentration, sleep disturbance and suicidal ideas. The patient is nervous/anxious.       Objective:   Physical Exam  Constitutional: He is oriented to person, place, and time. He appears well-developed and well-nourished.  HENT:  Head: Normocephalic and atraumatic.  Eyes: Pupils are equal, round, and reactive to light. EOM are normal.  Neck: No JVD  present. Carotid bruit is not present.  Cardiovascular: Normal rate, regular rhythm and normal heart sounds.   No murmur heard. Pulmonary/Chest: Effort normal and breath sounds normal. He has no rales.  Musculoskeletal: He exhibits no edema.  Neurological: He is alert and oriented to person, place, and time.  Skin: Skin is warm and dry.  Psychiatric: He has a normal mood and affect.  Vitals reviewed.  Vitals:   09/22/16 1341  BP: 137/69  Pulse: 90  Resp: 16  Temp: 98.4 F (36.9 C)  TempSrc: Oral  SpO2: 98%  Weight: 132 lb (59.9 kg)  Height: 5\' 9"  (1.753 m)      Assessment & Plan:    Roy Moore is a 61 y.o. male Hyperlipidemia, unspecified hyperlipidemia type - Plan: Comprehensive metabolic panel, Lipid panel, lovastatin (MEVACOR) 20 MG tablet  -Tolerating statin, check labs, may need repeat testing after 8 hours fast if elevated. Continue Mevacor same dose for now  Anxiety state - Plan:  Adjustment disorder with mixed anxiety and depressed mood - Plan: ALPRAZolam (XANAX) 1 MG tablet Insomnia, unspecified type - Plan: ALPRAZolam (XANAX) 1 MG tablet, amitriptyline (ELAVIL) 50 MG tablet  -Discussed concerns with solitary benzodiazepine treatment of anxiety, and recommendations of SSRI. He has been out of benzo for some time with worsening anxiety/irritability at home.   - Will restart at low dose of 1/2-1 up to 3 times a day when necessary for now, advised to not combine with narcotics due to risk of overdose. Understanding expressed. Continue Elavil at bedtime as needed.  PAD (peripheral artery disease) (HCC)  - s/p fem-pop bypass. Continue statin, follow up with vascular as planned. Importance of continuing to decrease smoking to cessation discussed.   Hyperglycemia - Plan: Hemoglobin A1c  - check A1c as prior elevated glucose.   Tobacco abuse  - cessation discussed, resources provided. Declined meds.   Loss of weight - Plan: Comprehensive metabolic panel,  CBC,TSH  - Noted at end of visit, advised to return to discuss weight loss further after initial blood work as above.  At risk for change in mental status - Plan: Ambulatory referral to Neurology  -Borderline low 6-CIT score. We'll have further evaluation with  neuro. With history of PAD, may have some vascular dementia risk.  Also end of visit, he mentioned chronic nasal congestion. There are other unrelated non-urgent complaints, but due to the amount of time I've already spent with him, time does not permit me to address these routine issues at today's visit. I've requested another appointment to review these additional issues.   Meds ordered this encounter  Medications  . lovastatin (MEVACOR) 20 MG tablet    Sig: Take 1 tablet (20 mg total) by mouth at bedtime.    Dispense:  90 tablet    Refill:  1  . ALPRAZolam (XANAX) 1 MG tablet    Sig: Take 0.5-1 tablets (0.5-1 mg total) by mouth 3 (three) times daily as needed for anxiety.    Dispense:  90 tablet    Refill:  0  . amitriptyline (ELAVIL) 50 MG tablet    Sig: Take 1 tablet (50 mg total) by mouth at bedtime.    Dispense:  60 tablet    Refill:  6   Patient Instructions   I will check your cholesterol level today, but if elevated - will need to repeat test after fasting for 8 hours.   Blood sugar has been elevated in past, and recently. I will check a 3 month blood sugar test to screen for diabetes today.   I can refill Xanax for now - try 1/2 pill initially, and only as needed. You can NOT take xanax if you are taking percocet as the 2 medicines can cause accidental overdose. I would recommend you try a less addictive medicine for anxiety as we discussed in past, but can talk about this medicine further at future visit.   I will refer you to neurology for other memory testing as the screening test did indicate some possible memory issues.  Continue to decrease smoking. [facility] offers smoking cessation clinics. Registration is  required. To register call [phone] or register online at [email or web address]   If you have other concerns that were not able to be addressed today, please make a list so we can discuss those at a future visit. I will check some bloodwork, but would like to discuss weight loss further at next visit within the next 2 weeks.   Return to the clinic or go to the nearest emergency room if any of your symptoms worsen or new symptoms occur.   Steps to Quit Smoking Smoking tobacco can be harmful to your health and can affect almost every organ in your body. Smoking puts you, and those around you, at risk for developing many serious chronic diseases. Quitting smoking is difficult, but it is one of the best things that you can do for your health. It is never too late to quit. What are the benefits of quitting smoking? When you quit smoking, you lower your risk of developing serious diseases and conditions, such as:  Lung cancer or lung disease, such as COPD.  Heart disease.  Stroke.  Heart attack.  Infertility.  Osteoporosis and bone fractures.  Additionally, symptoms such as coughing, wheezing, and shortness of breath may get better when you quit. You may also find that you get sick less often because your body is stronger at fighting off colds and infections. If you are pregnant, quitting smoking can help to reduce your chances of having a baby of low birth weight. How do I get ready to quit? When you decide to quit smoking, create a plan to make sure that you are successful.  Before you quit:  Pick a date to quit. Set a date within the next two weeks to give you time to prepare.  Write down the reasons why you are quitting. Keep this list in places where you will see it often, such as on your bathroom mirror or in your car or wallet.  Identify the people, places, things, and activities that make you want to smoke (triggers) and avoid them. Make sure to take these actions: ? Throw away all  cigarettes at home, at work, and in your car. ? Throw away smoking accessories, such as Scientist, research (medical). ? Clean your car and make sure to empty the ashtray. ? Clean your home, including curtains and carpets.  Tell your family, friends, and coworkers that you are quitting. Support from your loved ones can make quitting easier.  Talk with your health care provider about your options for quitting smoking.  Find out what treatment options are covered by your health insurance.  What strategies can I use to quit smoking? Talk with your healthcare provider about different strategies to quit smoking. Some strategies include:  Quitting smoking altogether instead of gradually lessening how much you smoke over a period of time. Research shows that quitting "cold [place]" is more successful than gradually quitting.  Attending in-person counseling to help you build problem-solving skills. You are more likely to have success in quitting if you attend several counseling sessions. Even short sessions of 10 minutes can be effective.  Finding resources and support systems that can help you to quit smoking and remain smoke-free after you quit. These resources are most helpful when you use them often. They can include: ? Online chats with a Social worker. ? Telephone quitlines. ? Careers information officer. ? Support groups or group counseling. ? Text messaging programs. ? Mobile phone applications.  Taking medicines to help you quit smoking. (If you are pregnant or breastfeeding, talk with your health care provider first.) Some medicines contain nicotine and some do not. Both types of medicines help with cravings, but the medicines that include nicotine help to relieve withdrawal symptoms. Your health care provider may recommend: ? Nicotine patches, gum, or lozenges. ? Nicotine inhalers or sprays. ? Non-nicotine medicine that is taken by mouth.  Talk with your health care provider about combining  strategies, such as taking medicines while you are also receiving in-person counseling. Using these two strategies together makes you more likely to succeed in quitting than if you used either strategy on its own. If you are pregnant or breastfeeding, talk with your health care provider about finding counseling or other support strategies to quit smoking. Do not take medicine to help you quit smoking unless told to do so by your health care provider. What things can I do to make it easier to quit? Quitting smoking might feel overwhelming at first, but there is a lot that you can do to make it easier. Take these important actions:  Reach out to your family and friends and ask that they support and encourage you during this time. Call telephone quitlines, reach out to support groups, or work with a counselor for support.  Ask people who smoke to avoid smoking around you.  Avoid places that trigger you to smoke, such as bars, parties, or smoke-break areas at work.  Spend time around people who do not smoke.  Lessen stress in your life, because stress can be a smoking trigger for some people. To lessen stress, try: ? Exercising regularly. ? Deep-breathing exercises. ?  Yoga. ? Meditating. ? Performing a body scan. This involves closing your eyes, scanning your body from head to toe, and noticing which parts of your body are particularly tense. Purposefully relax the muscles in those areas.  Download or purchase mobile phone or tablet apps (applications) that can help you stick to your quit plan by providing reminders, tips, and encouragement. There are many free apps, such as QuitGuide from the [employer] Office manager for Disease Control and Prevention). You can find other support for quitting smoking (smoking cessation) through smokefree.gov and other websites.  How will I feel when I quit smoking? Within the first 24 hours of quitting smoking, you may start to feel some withdrawal symptoms. These symptoms  are usually most noticeable 2-3 days after quitting, but they usually do not last beyond 2-3 weeks. Changes or symptoms that you might experience include:  Mood swings.  Restlessness, anxiety, or irritation.  Difficulty concentrating.  Dizziness.  Strong cravings for sugary foods in addition to nicotine.  Mild weight gain.  Constipation.  Nausea.  Coughing or a sore throat.  Changes in how your medicines work in your body.  A depressed mood.  Difficulty sleeping (insomnia).  After the first 2-3 weeks of quitting, you may start to notice more positive results, such as:  Improved sense of smell and taste.  Decreased coughing and sore throat.  Slower heart rate.  Lower blood pressure.  Clearer skin.  The ability to breathe more easily.  Fewer sick days.  Quitting smoking is very challenging for most people. Do not get discouraged if you are not successful the first time. Some people need to make many attempts to quit before they achieve long-term success. Do your best to stick to your quit plan, and talk with your health care provider if you have any questions or concerns. This information is not intended to replace advice given to you by your health care provider. Make sure you discuss any questions you have with your health care provider. Document Released: 01/06/2001 Document Revised: 09/10/2015 Document Reviewed: 05/29/2014 Elsevier Interactive Patient Education  2017 [employer].    IF you received an x-ray today, you will receive an invoice from [facility]. Please contact [facility] at [phone] with questions or concerns regarding your invoice.   IF you received labwork today, you will receive an invoice from [facility]. Please contact [facility] at [phone] with questions or concerns regarding your invoice.   Our billing staff will not be able to assist you with questions regarding bills from these companies.  You will be contacted  with the lab results as soon as they are available. The fastest way to get your results is to activate your My Chart account. Instructions are located on the last page of this paperwork. If you have not heard from [place] regarding the results in 2 weeks, please contact this office.       I personally performed the services described in this documentation, which was scribed in my presence. The recorded information has been reviewed and considered for accuracy and completeness, addended by me as needed, and agree with information above.  Signed,   [name], MD Primary Care at [place].  09/22/16 4:01 PM

## 2016-09-23 LAB — TSH: TSH: 1.6 u[IU]/mL (ref 0.450–4.500)

## 2016-09-23 LAB — LIPID PANEL
CHOL/HDL RATIO: 3.5 ratio (ref 0.0–5.0)
CHOLESTEROL TOTAL: 165 mg/dL (ref 100–199)
HDL: 47 mg/dL (ref >39)
LDL CALC: 97 mg/dL (ref 0–99)
Triglycerides: 107 mg/dL (ref 0–149)
VLDL Cholesterol Cal: 21 mg/dL (ref 5–40)

## 2016-09-23 LAB — COMPREHENSIVE METABOLIC PANEL
A/G RATIO: 1.1 — AB (ref 1.2–2.2)
ALBUMIN: 3.9 g/dL (ref 3.6–4.8)
ALK PHOS: 186 IU/L — AB (ref 39–117)
ALT: 19 IU/L (ref 0–44)
AST: 19 IU/L (ref 0–40)
BUN / CREAT RATIO: 12 (ref 10–24)
BUN: 15 mg/dL (ref 8–27)
Bilirubin Total: <0.2 mg/dL (ref 0.0–1.2)
CO2: 24 mmol/L (ref 20–29)
CREATININE: 1.21 mg/dL (ref 0.76–1.27)
Calcium: 9.2 mg/dL (ref 8.6–10.2)
Chloride: 101 mmol/L (ref 96–106)
GFR calc Af Amer: 75 mL/min/{1.73_m2} (ref >59)
GFR, EST NON AFRICAN AMERICAN: 65 mL/min/{1.73_m2} (ref >59)
GLOBULIN, TOTAL: 3.4 g/dL (ref 1.5–4.5)
Glucose: 115 mg/dL — ABNORMAL HIGH (ref 65–99)
Potassium: 4.2 mmol/L (ref 3.5–5.2)
SODIUM: 143 mmol/L (ref 134–144)
Total Protein: 7.3 g/dL (ref 6.0–8.5)

## 2016-09-23 LAB — CBC
Hematocrit: 35.8 % — ABNORMAL LOW (ref 37.5–51.0)
Hemoglobin: 11.8 g/dL — ABNORMAL LOW (ref 13.0–17.7)
MCH: 26.9 pg (ref 26.6–33.0)
MCHC: 33 g/dL (ref 31.5–35.7)
MCV: 82 fL (ref 79–97)
Platelets: 342 10*3/uL (ref 150–379)
RBC: 4.38 x10E6/uL (ref 4.14–5.80)
RDW: 15.5 % — AB (ref 12.3–15.4)
WBC: 10.4 10*3/uL (ref 3.4–10.8)

## 2016-09-23 LAB — HEMOGLOBIN A1C
ESTIMATED AVERAGE GLUCOSE: 117 mg/dL
Hgb A1c MFr Bld: 5.7 % — ABNORMAL HIGH (ref 4.8–5.6)

## 2016-10-06 ENCOUNTER — Telehealth: Payer: Self-pay | Admitting: Family Medicine

## 2016-10-06 NOTE — Telephone Encounter (Signed)
ERROR

## 2016-10-08 ENCOUNTER — Telehealth: Payer: Self-pay | Admitting: Family Medicine

## 2016-10-08 ENCOUNTER — Ambulatory Visit (INDEPENDENT_AMBULATORY_CARE_PROVIDER_SITE_OTHER): Payer: Medicare HMO | Admitting: Family Medicine

## 2016-10-08 ENCOUNTER — Encounter: Payer: Self-pay | Admitting: Family Medicine

## 2016-10-08 ENCOUNTER — Ambulatory Visit (INDEPENDENT_AMBULATORY_CARE_PROVIDER_SITE_OTHER): Payer: Medicare HMO

## 2016-10-08 VITALS — BP 125/62 | HR 85 | Temp 97.7°F | Resp 16 | Ht 69.0 in | Wt 133.0 lb

## 2016-10-08 DIAGNOSIS — F17200 Nicotine dependence, unspecified, uncomplicated: Secondary | ICD-10-CM

## 2016-10-08 DIAGNOSIS — R05 Cough: Secondary | ICD-10-CM

## 2016-10-08 DIAGNOSIS — R059 Cough, unspecified: Secondary | ICD-10-CM

## 2016-10-08 DIAGNOSIS — R634 Abnormal weight loss: Secondary | ICD-10-CM

## 2016-10-08 DIAGNOSIS — R748 Abnormal levels of other serum enzymes: Secondary | ICD-10-CM

## 2016-10-08 NOTE — Patient Instructions (Addendum)
  I will check alkaline phosphatase again today along with other tests to see if that elevated reading is from bone or from liver. I will also check chest x-ray, other blood tests to look into other causes of weight loss. I would recommend obtaining a visit with Dr. Fuller Plan as it appears you are about due for a repeat colonoscopy anyway as your last one did indicate 3 year follow-up.   Follow-up with me in the next 2-4 weeks to review tests from today and look into other causes of weight loss.  Make sure you do not combine Xanax with oxycodone, or other narcotic pain medicines. Continue to work on decreasing Xanax to one half pill if tolerated, and only as needed.    IF you received an x-ray today, you will receive an invoice from [facility]. Please contact [facility] at [phone] with questions or concerns regarding your invoice.   IF you received labwork today, you will receive an invoice from [facility]. Please contact [facility] at [phone] with questions or concerns regarding your invoice.   Our billing staff will not be able to assist you with questions regarding bills from these companies.  You will be contacted with the lab results as soon as they are available. The fastest way to get your results is to activate your My Chart account. Instructions are located on the last page of this paperwork. If you have not heard from [place] regarding the results in 2 weeks, please contact this office.

## 2016-10-08 NOTE — Progress Notes (Signed)
Subjective:  By signing my name below, I, [name], attest that this documentation has been prepared under the direction and in the presence of [name], MD. Electronically Signed: [name], [place]. 10/08/2016 , 3:30 PM .  Patient was seen in Room 10 .   Patient ID: Roy Moore, male    DOB: Oct 30, 1955, 61 y.o.   MRN: [id number] Chief Complaint  Patient presents with  . Follow-up    prev visit anxiety, pvd, hld and lab results   HPI Roy Moore is a 61 y.o. male  Patient was seen on Aug 28th. He has a history of multiple medical problems.   Repeat blood work He had an elevated alkaline phosphatase with level of 186 on Aug 28th. That was normal on June 29th.   He mentions having diarrhea yesterday but improved today; denies vomiting or diarrhea today.   Anemia Lab Results  Component Value Date   WBC 10.4 09/22/2016   HGB 11.8 (L) 09/22/2016   HCT 35.8 (L) 09/22/2016   MCV 82 09/22/2016   PLT 342 09/22/2016   Hemoglobin had improved from June labs.   Anxiety See prior visits, and discussion regarding xanax. Recommended SSRI in the past, but he has declined change in medications. He was continued on Xanax 29m, recommended decrease in dose as he was taking 179mTID for adjustment disorder. He was advised not to combine with percocet, which he was taking for his leg pain.   He reports still taking full pill of xanax, but cut down to 1-2 tablets a day. He takes percocet 1 tablet a day in the morning, as his legs still feel sore. He has an appointment with Dr. PoTrenton Gammonoming up.   Weight loss Patient reported a 40 lb weight loss when we met last visit, which he related to stress from surgeries. He was drinking Boost, eating multiple meals daily, and a snack at night. He had normal TSH, and CBC as above.   Wt Readings from Last 3 Encounters:  10/08/16 133 lb (60.3 kg)  09/22/16 132 lb (59.9 kg)  09/18/16 135 lb 4 oz (61.3 kg)   He had a colonoscopy in  Nov 2015, done by Dr. StFuller Planwith multiple polyps removed. Plan for repeat in 3 years.   He has some cough in the mornings. He denies any unexplained cough or coughing up any blood, denies night sweats or blood in stool. He's smoking half pack a day.   Patient Active Problem List   Diagnosis Date Noted  . PAD (peripheral artery disease) ([facility]/29/2018  . Peripheral arterial disease ([facility]/22/2018  . Lumbar stenosis with neurogenic claudication 12/03/2015  . CVA, old, hemiparesis ([facility]02/26/2013  . ANXIETY DISORDER 10/04/2007  . TOBACCO ABUSE 09/30/2007  . GERD 09/30/2007  . DYSLIPIDEMIA 09/15/2007  . STROKE WITH LEFT HEMIPARESIS 09/15/2007   Past Medical History:  Diagnosis Date  . Anxiety   . Complication of anesthesia   . Hyperlipidemia   . Peripheral vascular disease ([facility]  . PONV (postoperative nausea and vomiting)   . [facility]   no residual   Past Surgical History:  Procedure Laterality Date  . ABDOMINAL AORTOGRAM W/LOWER EXTREMITY [name] 06/09/2016   Procedure: Abdominal Aortogram w/Lower Extremity;  Surgeon: BrSerafina MitchellMD;  Location: [facility];  Service: Cardiovascular;  Laterality: [name];  . BACK SURGERY     10 yrs ago or so-fusion in neck   . CERVICAL FUSION    .  COLONOSCOPY     15-20 yrs ago-normal per pt.   . COLONOSCOPY W/ POLYPECTOMY    . CYSTOSCOPY     kidney stools removed   . FEMORAL-POPLITEAL BYPASS GRAFT Right 07/24/2016   Procedure: RIGHT FEMORAL TO ABOVE KNEE POPLITEAL ARTERY BYPASS GRAFT;  Surgeon: Serafina Mitchell, MD;  Location: [facility];  Service: Vascular;  Laterality: Right;  . LUMBAR LAMINECTOMY/DECOMPRESSION MICRODISCECTOMY Bilateral 12/03/2015   Procedure: Microdiscectomy - bilateral - Lumbar four-lumbar five;  Surgeon: Earnie Larsson, MD;  Location: [facility];  Service: Neurosurgery;  Laterality: Bilateral;  . PERIPHERAL VASCULAR INTERVENTION Right 06/09/2016   Procedure: Peripheral Vascular Intervention;  Surgeon: Serafina Mitchell,  MD;  Location: [facility];  Service: Cardiovascular;  Laterality: Right;  SFA  . teeth removal     Allergies  Allergen Reactions  . No Known Allergies    Prior to Admission medications   Medication Sig Start Date End Date Taking? Authorizing Provider  ALPRAZolam [name]) 1 MG tablet Take 0.5-1 tablets (0.5-1 mg total) by mouth 3 (three) times daily as needed for anxiety. 09/22/16   [name], MD  amitriptyline (ELAVIL) 50 MG tablet Take 1 tablet (50 mg total) by mouth at bedtime. 09/22/16   [name], MD  clopidogrel (PLAVIX) 75 MG tablet Take 1 tablet (75 mg total) by mouth daily. 07/28/16   Serafina Mitchell, MD  lovastatin (MEVACOR) 20 MG tablet Take 1 tablet (20 mg total) by mouth at bedtime. 09/22/16   [name], MD  oxyCODONE-acetaminophen (PERCOCET/ROXICET) 5-325 MG tablet Take 1 tablet by mouth every 6 (six) hours as needed for moderate pain. 07/26/16   Gabriel Earing, PA-C   Social History   Social History  . Marital status: Married    Spouse name: [name]  . Number of children: [name]  . Years of education: [name]   Occupational History  . Not on file.   Social History Main Topics  . Smoking status: Current Every Day Smoker    Packs/day: 0.50    Years: 47.00    Types: Cigarettes  . Smokeless tobacco: Never Used  . Alcohol use No     Comment: no alcohol in 23 years (12/02/15  . Drug use: No  . Sexual activity: Not on file   Other Topics Concern  . Not on file   Social History Narrative  . No narrative on file   Review of Systems  Constitutional: Negative for activity change, appetite change, diaphoresis, fatigue, fever and unexpected weight change.  Eyes: Negative for visual disturbance.  Respiratory: Negative for cough, chest tightness and shortness of breath.   Cardiovascular: Negative for chest pain, palpitations and leg swelling.  Gastrointestinal: Negative for abdominal pain, blood in stool, diarrhea, nausea and vomiting.  Musculoskeletal:  Positive for myalgias.  Neurological: Negative for dizziness, light-headedness and headaches.       Objective:   Physical Exam  Constitutional: He is oriented to person, place, and time. He appears well-developed and well-nourished. No distress.  HENT:  Head: Normocephalic and atraumatic.  Eyes: Pupils are equal, round, and reactive to light. EOM are normal.  Neck: Neck supple.  Cardiovascular: Normal rate.   Pulmonary/Chest: Effort normal. No respiratory distress.  Musculoskeletal: Normal range of motion.  Neurological: He is alert and oriented to person, place, and time.  Skin: Skin is warm and dry.  Psychiatric: He has a normal mood and affect. His behavior is normal.  Nursing note and vitals reviewed.   Vitals:  10/08/16 1445  BP: 125/62  Pulse: 85  Resp: 16  Temp: 97.7 F (36.5 C)  TempSrc: Oral  SpO2: 97%  Weight: 133 lb (60.3 kg)  Height: 5' 9" (1.753 m)       Assessment & Plan:  Roy Moore is a 61 y.o. male Elevated alkaline phosphatase level - Plan: Comprehensive metabolic panel, Gamma GT  Loss of weight - Plan: DG Chest 2 View, CBC  Cough - Plan: DG Chest 2 View, CBC  Tobacco use disorder - Plan: DG Chest 2 View  Elevated alkaline phosphatase, previously normal. Reports some weight loss.   - Repeat alkaline phosphatase, check GGT to see if liver related, then if not may need bone scan.   - Does report intermittent cough, but no concerning findings on chest x-ray.  - Due for repeat colonoscopy this year. advised to call gastroenterologist to consider repeat testing in setting of reported weight loss.   - Recheck 2-4 weeks, sooner if worse.  Again advised not to combine Xanax with any narcotic pain medication.  No orders of the defined types were placed in this encounter.  Patient Instructions    I will check alkaline phosphatase again today along with other tests to see if that elevated reading is from bone or from liver. I will also check  chest x-ray, other blood tests to look into other causes of weight loss. I would recommend obtaining a visit with Dr. Fuller Plan as it appears you are about due for a repeat colonoscopy anyway as your last one did indicate 3 year follow-up.   Follow-up with me in the next 2-4 weeks to review tests from today and look into other causes of weight loss.  Make sure you do not combine Xanax with oxycodone, or other narcotic pain medicines. Continue to work on decreasing Xanax to one half pill if tolerated, and only as needed.    IF you received an x-ray today, you will receive an invoice from [facility] Rad[facility]. Please contact [facility]iology at [phone] with questions or concerns regarding your invoice.   IF you received labwork today, you will receive an invoice from [facility]. Please contact [facility] at [phone] with questions or concerns regarding your invoice.   Our billing staff will not be able to assist you with questions regarding bills from these companies.  You will be contacted with the lab results as soon as they are available. The fastest way to get your results is to activate your My Chart account. Instructions are located on the last page of this paperwork. If you have not heard from [place] regarding the results in 2 weeks, please contact this office.       I personally performed the services described in this documentation, which was scribed in my presence. The recorded information has been reviewed and considered for accuracy and completeness, addended by me as needed, and agree with information above.  Signed,   [name], MD [facility].  10/10/16 2:38 PM

## 2016-10-08 NOTE — Telephone Encounter (Signed)
Patient was notified that he was due for a medicare AWV per note in the chart. Patient declined to schedule a wellness visit. We reviewed his chart and informed him that we did not see a AWV visit. He continued to insist he had been seen for one and did not want to schedule another visit. Patient declined to schedule a wellness visit

## 2016-10-09 LAB — COMPREHENSIVE METABOLIC PANEL
A/G RATIO: 1.2 (ref 1.2–2.2)
ALBUMIN: 4.2 g/dL (ref 3.6–4.8)
ALT: 25 IU/L (ref 0–44)
AST: 24 IU/L (ref 0–40)
Alkaline Phosphatase: 268 IU/L — ABNORMAL HIGH (ref 39–117)
BILIRUBIN TOTAL: 0.2 mg/dL (ref 0.0–1.2)
BUN / CREAT RATIO: 15 (ref 10–24)
BUN: 17 mg/dL (ref 8–27)
CO2: 26 mmol/L (ref 20–29)
CREATININE: 1.16 mg/dL (ref 0.76–1.27)
Calcium: 9.8 mg/dL (ref 8.6–10.2)
Chloride: 99 mmol/L (ref 96–106)
GFR calc Af Amer: 79 mL/min/{1.73_m2} (ref >59)
GFR calc non Af Amer: 68 mL/min/{1.73_m2} (ref >59)
GLOBULIN, TOTAL: 3.6 g/dL (ref 1.5–4.5)
Glucose: 99 mg/dL (ref 65–99)
POTASSIUM: 4.4 mmol/L (ref 3.5–5.2)
SODIUM: 141 mmol/L (ref 134–144)
Total Protein: 7.8 g/dL (ref 6.0–8.5)

## 2016-10-09 LAB — CBC
HEMATOCRIT: 39 % (ref 37.5–51.0)
Hemoglobin: 12.4 g/dL — ABNORMAL LOW (ref 13.0–17.7)
MCH: 26.5 pg — ABNORMAL LOW (ref 26.6–33.0)
MCHC: 31.8 g/dL (ref 31.5–35.7)
MCV: 83 fL (ref 79–97)
PLATELETS: 402 10*3/uL — AB (ref 150–379)
RBC: 4.68 x10E6/uL (ref 4.14–5.80)
RDW: 15.8 % — AB (ref 12.3–15.4)
WBC: 10.4 10*3/uL (ref 3.4–10.8)

## 2016-10-09 LAB — GAMMA GT: GGT: 175 IU/L — AB (ref 0–65)

## 2016-10-11 ENCOUNTER — Other Ambulatory Visit: Payer: Self-pay | Admitting: Family Medicine

## 2016-10-11 DIAGNOSIS — R748 Abnormal levels of other serum enzymes: Secondary | ICD-10-CM

## 2016-10-11 NOTE — Progress Notes (Signed)
Elevated alkaline phosphatase, GGT. Initially will order liver ultrasound, but also possible plan for GI eval.

## 2016-10-15 DIAGNOSIS — M48062 Spinal stenosis, lumbar region with neurogenic claudication: Secondary | ICD-10-CM | POA: Diagnosis not present

## 2016-10-16 ENCOUNTER — Telehealth: Payer: Self-pay

## 2016-10-16 NOTE — Telephone Encounter (Signed)
Left vm for pt to let him know to only come for office visit on 11/03/16 at 3 p.m. Pt was scheduled for AWV at check out with nurse but he already had that visit and does not need to come early for it.    Josepha Pigg, B.A.  Care Guide [phone]

## 2016-10-20 ENCOUNTER — Telehealth: Payer: Self-pay

## 2016-10-20 NOTE — Telephone Encounter (Signed)
Left message for pt to give me a call back to discuss community resources.    Roy Moore, B.A.  Care Guide [phone]

## 2016-11-03 ENCOUNTER — Ambulatory Visit: Payer: Medicare HMO

## 2016-11-03 ENCOUNTER — Ambulatory Visit: Payer: Medicare HMO | Admitting: Family Medicine

## 2016-11-23 ENCOUNTER — Ambulatory Visit (INDEPENDENT_AMBULATORY_CARE_PROVIDER_SITE_OTHER): Payer: Medicare HMO | Admitting: Surgery

## 2016-11-23 ENCOUNTER — Ambulatory Visit (INDEPENDENT_AMBULATORY_CARE_PROVIDER_SITE_OTHER)
Admission: RE | Admit: 2016-11-23 | Discharge: 2016-11-23 | Disposition: A | Discharge status: Home / Self Care | Payer: Medicare HMO | Source: Ambulatory Visit | Attending: Surgery | Admitting: Surgery

## 2016-11-23 ENCOUNTER — Encounter: Payer: Self-pay | Admitting: Surgery

## 2016-11-23 ENCOUNTER — Ambulatory Visit (HOSPITAL_COMMUNITY)
Admission: RE | Admit: 2016-11-23 | Discharge: 2016-11-23 | Disposition: A | Discharge status: Home / Self Care | Payer: Medicare HMO | Source: Ambulatory Visit | Attending: Surgery | Admitting: Surgery

## 2016-11-23 VITALS — BP 171/89 | HR 62 | Temp 97.8°F | Resp 18 | Ht 69.0 in | Wt 144.0 lb

## 2016-11-23 DIAGNOSIS — I70211 Atherosclerosis of native arteries of extremities with intermittent claudication, right leg: Secondary | ICD-10-CM

## 2016-11-23 DIAGNOSIS — Z95828 Presence of other vascular implants and grafts: Secondary | ICD-10-CM | POA: Diagnosis not present

## 2016-11-23 DIAGNOSIS — I70212 Atherosclerosis of native arteries of extremities with intermittent claudication, left leg: Secondary | ICD-10-CM | POA: Diagnosis not present

## 2016-11-23 NOTE — Progress Notes (Signed)
[facility]ist of [place]  Patient name: Roy Moore MRN: [id number] DOB: 27-Mar-1955 Sex: male   REASON FOR VISIT:    Follow up  HISOTRY OF PRESENT ILLNESS:    Roy Moore is a 61 y.o. male who is status post right femoral to above-knee popliteal artery bypass graft with ipsilateral non-reversed greater saphenous vein.  He had heavily calcified plaque within the common femoral artery which required endarterectomy.  This was performed on 07/24/2016.  His operation was done for severe claudication, bordering on rest pain.  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  His preoperative ABI was 0.3.  He states that he still has some numbness in his foot but is walking around much better.  He is trying to quit smoking.  He is down to less than half a pack a day.   PAST MEDICAL HISTORY:   Past Medical History:  Diagnosis Date  . Anxiety   . Complication of anesthesia   . Hyperlipidemia   . Peripheral vascular disease ([place])   . PONV (postoperative nausea and vomiting)   . Stroke [facility]) 2009   no residual     FAMILY HISTORY:   Family History  Problem Relation Age of Onset  . Colon cancer Neg Hx   . Rectal cancer Neg Hx   . Stomach cancer Neg Hx   . Esophageal cancer Neg Hx     SOCIAL HISTORY:   Social History  Substance Use Topics  . Smoking status: Current Every Day Smoker    Packs/day: 0.50    Years: 47.00    Types: Cigarettes  . Smokeless tobacco: Never Used  . Alcohol use No     Comment: no alcohol in 23 years (12/02/15     ALLERGIES:   Allergies  Allergen Reactions  . No Known Allergies      CURRENT MEDICATIONS:   Current Outpatient Prescriptions  Medication Sig Dispense Refill  . ALPRAZolam (XANAX) 1 MG tablet Take 0.5-1 tablets (0.5-1 mg total) by mouth 3 (three) times daily as needed for anxiety. 90 tablet 0  . amitriptyline (ELAVIL) 50 MG tablet Take 1 tablet (50 mg total) by mouth at  bedtime. 60 tablet 6  . clopidogrel (PLAVIX) 75 MG tablet Take 1 tablet (75 mg total) by mouth daily. 30 tablet 11  . diclofenac (CATAFLAM) 50 MG tablet     . lovastatin (MEVACOR) 20 MG tablet Take 1 tablet (20 mg total) by mouth at bedtime. 90 tablet 1  . oxyCODONE-acetaminophen (PERCOCET/ROXICET) 5-325 MG tablet Take 1 tablet by mouth every 6 (six) hours as needed for moderate pain. 30 tablet 0   No current facility-administered medications for this visit.     REVIEW OF SYSTEMS:   [X]  denotes positive finding, [ ]  denotes negative finding Cardiac  Comments:  Chest pain or chest pressure:    Shortness of breath upon exertion:    Short of breath when lying flat:    Irregular heart rhythm:        Vascular    Pain in calf, thigh, or hip brought on by ambulation:    Pain in feet at night that wakes you up from your sleep:     Blood clot in your veins:    Leg swelling:         Pulmonary    Oxygen at home:    Productive cough:     Wheezing:         Neurologic  Sudden weakness in arms or legs:     Sudden numbness in arms or legs:     Sudden onset of difficulty speaking or slurred speech:    Temporary loss of vision in one eye:     Problems with dizziness:         Gastrointestinal    Blood in stool:     Vomited blood:         Genitourinary    Burning when urinating:     Blood in urine:        Psychiatric    Major depression:         Hematologic    Bleeding problems:    Problems with blood clotting too easily:        Skin    Rashes or ulcers:        Constitutional    Fever or chills:      PHYSICAL EXAM:   Vitals:   11/23/16 1549 11/23/16 1552  BP: (!) 168/76 (!) 171/89  Pulse: 62 62  Resp: 18   Temp: 97.8 F (36.6 C)   SpO2: 98%   Weight: 144 lb (65.3 kg)   Height: 5\' 9"  (1.753 m)     GENERAL: The patient is a well-nourished male, in no acute distress. The vital signs are documented above. CARDIAC: There is a regular rate and rhythm.  VASCULAR:  Palpable dorsalis pedis pulse PULMONARY: Non-labored respirationsMUSCULOSKELETAL: There are no major deformities or cyanosis. NEUROLOGIC: No focal weakness or paresthesias are detected. SKIN: There are no ulcers or rashes noted. PSYCHIATRIC: The patient has a normal affect.  STUDIES:   I have reviewed his duplex and ABIs.  The ABI 0.82 on the right oh (0.88) on the left is 0.79 (0.91.  His duplex shows 50-74 percent stenosis at the distal anastomosis and native popliteal artery with peak velocity of 2 46 cm/s  MEDICAL ISSUES:   Status post right femoral-popliteal bypass graft with elevated velocities at the distal anastomosis.  The patient has not yet reached my threshold for arteriogram.  I would wait until this becomes greater than 300 or until he has a significant decrease in ABI.  I have him following up with repeat ultrasound in 6 months.    [name], MD [facility] [phone] Pager [phone]

## 2016-11-27 NOTE — Addendum Note (Signed)
Addended by: Lianne Cure A on: 11/27/2016 01:07 PM   Modules accepted: Orders

## 2016-12-08 ENCOUNTER — Emergency Department (HOSPITAL_COMMUNITY)
Admission: EM | Admit: 2016-12-08 | Discharge: 2016-12-08 | Disposition: A | Discharge status: Home / Self Care | Payer: Medicare HMO | Attending: Emergency Medicine | Admitting: Emergency Medicine

## 2016-12-08 ENCOUNTER — Other Ambulatory Visit: Payer: Self-pay

## 2016-12-08 ENCOUNTER — Encounter (HOSPITAL_COMMUNITY): Payer: Self-pay | Admitting: Emergency Medicine

## 2016-12-08 DIAGNOSIS — F17210 Nicotine dependence, cigarettes, uncomplicated: Secondary | ICD-10-CM | POA: Insufficient documentation

## 2016-12-08 DIAGNOSIS — Z8673 Personal history of transient ischemic attack (TIA), and cerebral infarction without residual deficits: Secondary | ICD-10-CM | POA: Insufficient documentation

## 2016-12-08 DIAGNOSIS — Z7902 Long term (current) use of antithrombotics/antiplatelets: Secondary | ICD-10-CM | POA: Diagnosis not present

## 2016-12-08 DIAGNOSIS — R51 Headache: Secondary | ICD-10-CM | POA: Diagnosis present

## 2016-12-08 DIAGNOSIS — J0181 Other acute recurrent sinusitis: Secondary | ICD-10-CM | POA: Diagnosis not present

## 2016-12-08 LAB — COMPREHENSIVE METABOLIC PANEL
ALBUMIN: 3.5 g/dL (ref 3.5–5.0)
ALT: 29 U/L (ref 17–63)
ANION GAP: 10 (ref 5–15)
AST: 24 U/L (ref 15–41)
Alkaline Phosphatase: 235 U/L — ABNORMAL HIGH (ref 38–126)
BUN: 13 mg/dL (ref 6–20)
CALCIUM: 9.4 mg/dL (ref 8.9–10.3)
CO2: 27 mmol/L (ref 22–32)
Chloride: 101 mmol/L (ref 101–111)
Creatinine, Ser: 1.1 mg/dL (ref 0.61–1.24)
GFR calc Af Amer: >60 mL/min (ref >60)
GLUCOSE: 111 mg/dL — AB (ref 65–99)
Potassium: 3.7 mmol/L (ref 3.5–5.1)
Sodium: 138 mmol/L (ref 135–145)
Total Bilirubin: 0.7 mg/dL (ref 0.3–1.2)
Total Protein: 7.8 g/dL (ref 6.5–8.1)

## 2016-12-08 LAB — CBC
HCT: 39.4 % (ref 39.0–52.0)
Hemoglobin: 12.6 g/dL — ABNORMAL LOW (ref 13.0–17.0)
MCH: 27.8 pg (ref 26.0–34.0)
MCHC: 32 g/dL (ref 30.0–36.0)
MCV: 87 fL (ref 78.0–100.0)
PLATELETS: 345 10*3/uL (ref 150–400)
RBC: 4.53 MIL/uL (ref 4.22–5.81)
RDW: 15.6 % — AB (ref 11.5–15.5)
WBC: 12.8 10*3/uL — ABNORMAL HIGH (ref 4.0–10.5)

## 2016-12-08 LAB — LIPASE, BLOOD: LIPASE: 19 U/L (ref 11–51)

## 2016-12-08 MED ORDER — IBUPROFEN 400 MG PO TABS
600.0000 mg | ORAL_TABLET | Freq: Once | ORAL | Status: AC
  Administered 2016-12-08: 600 mg via ORAL
  Filled 2016-12-08: qty 1

## 2016-12-08 MED ORDER — AMOXICILLIN-POT CLAVULANATE 875-125 MG PO TABS: 1.0000 | ORAL_TABLET | Freq: Two times a day (BID) | ORAL | 0 refills | Status: DC

## 2016-12-08 MED ORDER — IBUPROFEN 600 MG PO TABS: 600.0000 mg | ORAL_TABLET | Freq: Four times a day (QID) | ORAL | 0 refills | Status: DC | PRN

## 2016-12-08 MED ORDER — AMOXICILLIN-POT CLAVULANATE 875-125 MG PO TABS
1.0000 | ORAL_TABLET | Freq: Once | ORAL | Status: AC
  Administered 2016-12-08: 1 via ORAL
  Filled 2016-12-08: qty 1

## 2016-12-08 NOTE — Discharge Instructions (Signed)
Take Augmentin twice daily for the next week Please follow up with your doctor regarding your blood work

## 2016-12-08 NOTE — ED Triage Notes (Signed)
Pt to ER for evaluation of multiple complaints - states has had a headache and URI s/s for "weeks" and began vomiting last night with 3 episodes. Patient in NAD. States "I just wanted to come here to find out what was wrong with me all this time." VSS.

## 2016-12-08 NOTE — ED Notes (Signed)
Patient verbalizes understanding of discharge instructions. Opportunity for questioning and answers were provided. 

## 2016-12-08 NOTE — ED Provider Notes (Addendum)
[facility] EMERGENCY DEPARTMENT Provider Note   CSN: [id number] Arrival date & time: 12/08/16  1151   History   Chief Complaint Chief Complaint  Patient presents with  . Nausea  . Emesis  . Headache  . Nasal Congestion    HPI Roy Moore is a 61 y.o. male who presents with right sided headache and ear pain. PMH significant for chronic sinusitis, PVD, HLD, tobacco use, hx of CVA, hx of chronic low back pain. The patient states that over the past week he has had gradually worsening right ear pain and and a right sided headache. Over the past 2 days the pain has become more severe and he has been unable to sleep. Lying on the ear makes the pain better and so does blowing his nose. He states it feels like he has "fluid in my ear". He reports associated rhinorrhea and congestion. Symptoms feel similar to prior sinus infections however he usually doesn't have a headache. No fever, sore throat, chest pain, cough, SOB. No dizziness, weakness, vision changes. He states he usually take Augmentin which clears up his symptoms.  HPI  Past Medical History:  Diagnosis Date  . Anxiety   . Complication of anesthesia   . Hyperlipidemia   . Peripheral vascular disease ([place])   . PONV (postoperative nausea and vomiting)   . Stroke [facility]) 2009   no residual    Patient Active Problem List   Diagnosis Date Noted  . PAD (peripheral artery disease) ([place]) 07/24/2016  . Peripheral arterial disease ([place]) 07/17/2016  . Lumbar stenosis with neurogenic claudication 12/03/2015  . CVA, old, hemiparesis ([place]) 03/24/2011  . ANXIETY DISORDER 10/04/2007  . TOBACCO ABUSE 09/30/2007  . GERD 09/30/2007  . DYSLIPIDEMIA 09/15/2007  . STROKE WITH LEFT HEMIPARESIS 09/15/2007    Past Surgical History:  Procedure Laterality Date  . BACK SURGERY     10 yrs ago or so-fusion in neck   . CERVICAL FUSION    . COLONOSCOPY     15-20 yrs ago-normal per pt.   . COLONOSCOPY W/ POLYPECTOMY    .  CYSTOSCOPY     kidney stools removed   . teeth removal      Home Medications    Prior to Admission medications   Medication Sig Start Date End Date Taking? Authorizing Provider  ALPRAZolam [name]) 1 MG tablet Take 0.5-1 tablets (0.5-1 mg total) by mouth 3 (three) times daily as needed for anxiety. 09/22/16  Yes [name], MD  amitriptyline (ELAVIL) 50 MG tablet Take 1 tablet (50 mg total) by mouth at bedtime. 09/22/16  Yes [name], MD  clopidogrel (PLAVIX) 75 MG tablet Take 1 tablet (75 mg total) by mouth daily. 07/28/16  Yes Serafina Mitchell, MD  diclofenac (CATAFLAM) 50 MG tablet Take 50 mg 2 (two) times daily by mouth.  10/20/16  Yes [provider]  lovastatin (MEVACOR) 20 MG tablet Take 1 tablet (20 mg total) by mouth at bedtime. 09/22/16  Yes [name], MD  oxyCODONE-acetaminophen (PERCOCET/ROXICET) 5-325 MG tablet Take 1 tablet by mouth every 6 (six) hours as needed for moderate pain. 07/26/16  Yes Rhyne, Hulen Shouts, PA-C  amoxicillin-clavulanate (AUGMENTIN) 875-125 MG tablet Take 1 tablet every 12 (twelve) hours by mouth. 12/08/16   Recardo Evangelist, PA-C  ibuprofen (ADVIL,MOTRIN) 600 MG tablet Take 1 tablet (600 mg total) every 6 (six) hours as needed by mouth. 12/08/16   Recardo Evangelist, PA-C    Family History  Family History  Problem Relation Age of Onset  . Colon cancer Neg Hx   . Rectal cancer Neg Hx   . Stomach cancer Neg Hx   . Esophageal cancer Neg Hx     Social History Social History   Tobacco Use  . Smoking status: Current Every Day Smoker    Packs/day: 0.50    Years: 47.00    Pack years: 23.50    Types: Cigarettes  . Smokeless tobacco: Never Used  Substance Use Topics  . Alcohol use: No    Alcohol/week: 0.0 oz    Comment: no alcohol in 23 years (12/02/15  . Drug use: No     Allergies   No known allergies   Review of Systems Review of Systems  Constitutional: Negative for chills and fever.  HENT: Positive for ear  pain and rhinorrhea. Negative for congestion and sore throat.   Eyes: Negative for visual disturbance.  Respiratory: Negative for cough and shortness of breath.   Neurological: Positive for headaches. Negative for dizziness and syncope.  All other systems reviewed and are negative.    Physical Exam Updated Vital Signs BP (!) 160/70   Pulse 79   Temp 99 F (37.2 C) (Oral)   Resp 14   SpO2 92%   Physical Exam  Constitutional: He is oriented to person, place, and time. He appears well-developed and well-nourished. No distress.  HENT:  Head: Normocephalic and atraumatic.  Right Ear: Hearing, tympanic membrane, external ear and ear canal normal.  Left Ear: Hearing, tympanic membrane, external ear and ear canal normal.  Nose: Mucosal edema present.  Mouth/Throat: Uvula is midline, oropharynx is clear and moist and mucous membranes are normal.  Tenderness around the right ear  Eyes: Conjunctivae are normal. Pupils are equal, round, and reactive to light. Right eye exhibits no discharge. Left eye exhibits no discharge. No scleral icterus.  Neck: Normal range of motion.  Cardiovascular: Normal rate and regular rhythm. Exam reveals no gallop and no friction rub.  No murmur heard. Pulmonary/Chest: Effort normal and breath sounds normal. No stridor. No respiratory distress. He has no wheezes. He has no rales. He exhibits no tenderness.  Abdominal: He exhibits no distension.  Neurological: He is alert and oriented to person, place, and time.  Lying on stretcher in NAD. GCS 15. Speaks in a clear voice. Cranial nerves II through XII grossly intact. 5/5 strength in all extremities. Sensation fully intact.  Bilateral finger-to-nose intact. Ambulatory    Skin: Skin is warm and dry.  Psychiatric: He has a normal mood and affect. His behavior is normal.  Nursing note and vitals reviewed.    ED Treatments / Results  Labs (all labs ordered are listed, but only abnormal results are  displayed) Labs Reviewed  COMPREHENSIVE METABOLIC PANEL - Abnormal; Notable for the following components:      Result Value   Glucose, Bld 111 (*)    Alkaline Phosphatase 235 (*)    All other components within normal limits  CBC - Abnormal; Notable for the following components:   WBC 12.8 (*)    Hemoglobin 12.6 (*)    RDW 15.6 (*)    All other components within normal limits  LIPASE, BLOOD    EKG  EKG Interpretation None       Radiology No results found.  Procedures Procedures (including critical care time)  Medications Ordered in ED Medications  ibuprofen (ADVIL,MOTRIN) tablet 600 mg (600 mg Oral Given 12/08/16 2047)  amoxicillin-clavulanate (AUGMENTIN) 875-125 MG per  tablet 1 tablet (1 tablet Oral Given 12/08/16 2047)     Initial Impression / Assessment and Plan / ED Course  I have reviewed the triage vital signs and the nursing notes.  Pertinent labs & imaging results that were available during my care of the patient were reviewed by me and considered in my medical decision making (see chart for details).  61 year old male presents with headache, rhinorrhea/congestion, right ear pain and the feeling of fluids behind the ear. He has issues with chronic sinusitis. Temp is 99 and he is hypertensive. Otherwise vitals are normal. CBC is remarkable for leukocytosis of 12.8. CMP is remarkable for elevated AP (235) which was present for his last set of labs in September. HA is likely related to ear pain. He has a normal neuro exam. Will treat with Augmentin and advised f/u with PCP.  Final Clinical Impressions(s) / ED Diagnoses   Final diagnoses:  Other acute recurrent sinusitis    ED Discharge Orders        Ordered    amoxicillin-clavulanate (AUGMENTIN) 875-125 MG tablet  Every 12 hours     12/08/16 2114    ibuprofen (ADVIL,MOTRIN) 600 MG tablet  Every 6 hours PRN     12/08/16 2114           Recardo Evangelist, PA-C 12/09/16 1816    Fatima Blank,  MD 12/09/16 2110

## 2016-12-10 HISTORY — PX: COLONOSCOPY WITH PROPOFOL: SHX5780

## 2016-12-30 ENCOUNTER — Encounter: Payer: Self-pay | Admitting: Gastroenterology

## 2017-02-25 ENCOUNTER — Other Ambulatory Visit: Payer: Self-pay | Admitting: Family Medicine

## 2017-02-25 DIAGNOSIS — G4700 Insomnia, unspecified: Secondary | ICD-10-CM

## 2017-02-25 DIAGNOSIS — F4323 Adjustment disorder with mixed anxiety and depressed mood: Secondary | ICD-10-CM

## 2017-02-25 NOTE — Telephone Encounter (Signed)
Xanax refill request Last OV 10/08/16 [facility] [id number], [place]  [address].

## 2017-02-26 ENCOUNTER — Telehealth: Payer: Self-pay | Admitting: Family Medicine

## 2017-02-26 DIAGNOSIS — F4323 Adjustment disorder with mixed anxiety and depressed mood: Secondary | ICD-10-CM

## 2017-02-26 DIAGNOSIS — G4700 Insomnia, unspecified: Secondary | ICD-10-CM

## 2017-02-26 NOTE — Telephone Encounter (Signed)
Copied from [place] [id number]. Topic: Quick Communication - Rx Refill/Question >> Feb 26, 2017  1:22 PM Oliver Pila B wrote: Medication: ALPRAZolam [name]) 1 MG tablet [[id number]] , amitriptyline (ELAVIL) 50 MG tablet [[id number]] ,  clopidogrel (PLAVIX) 75 MG tablet [[id number]]

## 2017-02-26 NOTE — Telephone Encounter (Signed)
Patient is requesting a refill of the following medications: Requested Prescriptions    No prescriptions requested or ordered in this encounter   Plavix 75 mg by Dr Harold Barban Date of patient request: 02/26/17 Last office visit: 09/28/16 Date of last refill: 07/28/16 Last refill amount: 30 tablets 11RF Follow up time period per chart: No follow up scheduled at this time   Xanax 1mg  #90 no refills Last filled 09/22/16  Elavil 50mg  #60 6 RF  Last filled 09/22/16

## 2017-02-27 MED ORDER — ALPRAZOLAM 1 MG PO TABS: 0.5000 mg | ORAL_TABLET | Freq: Three times a day (TID) | ORAL | 0 refills | Status: DC | PRN

## 2017-02-27 MED ORDER — AMITRIPTYLINE HCL 50 MG PO TABS: 50.0000 mg | ORAL_TABLET | Freq: Every day | ORAL | 6 refills | Status: DC

## 2017-02-27 NOTE — Telephone Encounter (Signed)
Xanax, Elavil refilled, follow up to discuss in next 2 months. He had 11 refills of Plavix when prescribed last July so should have some left.  Initial Xanax prescription printed, can shred as secondary refill e-prescribed

## 2017-03-01 NOTE — Telephone Encounter (Signed)
Already done

## 2017-03-29 ENCOUNTER — Ambulatory Visit: Payer: Medicare HMO | Admitting: Family Medicine

## 2017-04-01 ENCOUNTER — Ambulatory Visit: Payer: Medicare HMO | Admitting: Family Medicine

## 2017-04-05 ENCOUNTER — Other Ambulatory Visit: Payer: Self-pay

## 2017-04-05 ENCOUNTER — Ambulatory Visit (INDEPENDENT_AMBULATORY_CARE_PROVIDER_SITE_OTHER): Payer: Medicare HMO | Admitting: Family Medicine

## 2017-04-05 ENCOUNTER — Encounter: Payer: Self-pay | Admitting: Family Medicine

## 2017-04-05 VITALS — BP 140/70 | HR 75 | Temp 98.3°F | Resp 18 | Ht 69.0 in | Wt 158.6 lb

## 2017-04-05 DIAGNOSIS — J329 Chronic sinusitis, unspecified: Secondary | ICD-10-CM

## 2017-04-05 DIAGNOSIS — G4700 Insomnia, unspecified: Secondary | ICD-10-CM | POA: Diagnosis not present

## 2017-04-05 DIAGNOSIS — R748 Abnormal levels of other serum enzymes: Secondary | ICD-10-CM

## 2017-04-05 DIAGNOSIS — J3489 Other specified disorders of nose and nasal sinuses: Secondary | ICD-10-CM

## 2017-04-05 DIAGNOSIS — R6883 Chills (without fever): Secondary | ICD-10-CM | POA: Diagnosis not present

## 2017-04-05 DIAGNOSIS — F411 Generalized anxiety disorder: Secondary | ICD-10-CM

## 2017-04-05 DIAGNOSIS — E785 Hyperlipidemia, unspecified: Secondary | ICD-10-CM

## 2017-04-05 MED ORDER — AMITRIPTYLINE HCL 50 MG PO TABS: 50.0000 mg | ORAL_TABLET | Freq: Every day | ORAL | 6 refills | Status: DC

## 2017-04-05 MED ORDER — AMOXICILLIN-POT CLAVULANATE 875-125 MG PO TABS: 1.0000 | ORAL_TABLET | Freq: Two times a day (BID) | ORAL | 0 refills | Status: DC

## 2017-04-05 MED ORDER — LOVASTATIN 20 MG PO TABS: 20.0000 mg | ORAL_TABLET | Freq: Every day | ORAL | 1 refills | Status: DC

## 2017-04-05 NOTE — Progress Notes (Signed)
Subjective:  By signing my name below, I, Roy Moore, attest that this documentation has been prepared under the direction and in the presence of Roy Ray, MD. Electronically Signed: [name], [place]. 04/05/2017 , 5:08 PM .  Patient was seen in Room 10 .   Patient ID: Roy Moore, male    DOB: [birth year], 62 y.o.   MRN: [id number] Chief Complaint  Patient presents with  . Medication Refill    needs all meds and thinks he has a sinus infection    HPI Roy Moore is a 62 y.o. male Here for follow up and medication refills. Patient has a history of anxiety, tobacco abuse, GERD, CVA with hemiparesis and PAD. His last meal was breakfast this morning.   Tobacco abuse He's cut back to half a pack per day now.   Sinus infection Patient was seen in the ER in Nov, and was placed on Augmentin. His sinus infection had improved at that time. Recently, for about the past week, he noticed headache with sinus congestion and nasal congestion blowing out green discharge from his nose. He had some chills but no measured fever (no thermometer at home). He mentions having 1-2x sinus infection in a year.   Anxiety See previous discussions. He's used xanax 1mg , half to 1 tablet as needed. I have recommended SSRI with plan on decreasing dose of xanax and advised to not combine any doses with narcotic pain medication. He was referred to neurology due to questions on memory at Aug 2018. When discussed in follow up in Sept, patient was still taking 1mg  but 1-2 tablets a day. This was last prescribed #90 on Feb 2nd. He also takes Elavil 50mg  QHS, for neuropathic pain previously.   Patient states he's had increased stress recently as he's about to lose his house. He's currently taking 2 tablets of xanax 1mg  QD. He also takes 1 percocet QD. He denies taking them together at the same time. He's also still taking Elavil at bed time.   History of weight loss Thought due to stress from surgeries  and appetite. See discussion in Sept 2018.   Wt Readings from Last 3 Encounters:  04/05/17 158 lb 9.6 oz (71.9 kg)  11/23/16 144 lb (65.3 kg)  10/08/16 133 lb (60.3 kg)   PAD He is followed by Dr. Trula Moore, last seen on Oct 29th, 2018. He had a fem pop bypass on the right with claudication. Plan for repeat ultrasound in 6 months. He does take Plavix 75mg  QD.   He has an appointment with Dr. Trula Moore in April. He also has an appointment with Dr. Trenton Moore in April.   Hyperlipidemia with peripheral arterial disease Lab Results  Component Value Date   CHOL 165 09/22/2016   HDL 47 09/22/2016   LDLCALC 97 09/22/2016   TRIG 107 09/22/2016   CHOLHDL 3.5 09/22/2016   Lab Results  Component Value Date   ALT 29 12/08/2016   AST 24 12/08/2016   GGT 175 (H) 10/08/2016   ALKPHOS 235 (H) 12/08/2016   BILITOT 0.7 12/08/2016   He takes lovastatin 20mg  QHS.   Elevated Alkaline phosphatase See prior notes, alkaline phosphatase was 268 down to 235 in Nov 2018. It was 186 when I last saw him in Sept. GGT was elevated at 175. Had ordered abdominal ultrasound which has not been performed. Patient states he didn't have ultrasound done due to "seeing too many doctors last year, and trying to get my weight back up."  Patient Active Problem List   Diagnosis Date Noted  . PAD (peripheral artery disease) ([place]) 07/24/2016  . Peripheral arterial disease ([place]) 07/17/2016  . Lumbar stenosis with neurogenic claudication 12/03/2015  . CVA, old, hemiparesis ([place]) 03/24/2011  . ANXIETY DISORDER 10/04/2007  . TOBACCO ABUSE 09/30/2007  . GERD 09/30/2007  . DYSLIPIDEMIA 09/15/2007  . STROKE WITH LEFT HEMIPARESIS 09/15/2007   Past Medical History:  Diagnosis Date  . Anxiety   . Complication of anesthesia   . Hyperlipidemia   . Peripheral vascular disease ([place])   . PONV (postoperative nausea and vomiting)   . Stroke [facility]) 2009   no residual   Past Surgical History:  Procedure Laterality Date  .  ABDOMINAL AORTOGRAM W/LOWER EXTREMITY [name] 06/09/2016   Procedure: Abdominal Aortogram w/Lower Extremity;  Surgeon: Roy Mitchell, MD;  Location: [place] [facility];  Service: Cardiovascular;  Laterality: [name];  . BACK SURGERY     10 yrs ago or so-fusion in neck   . CERVICAL FUSION    . COLONOSCOPY     15-20 yrs ago-normal per pt.   . COLONOSCOPY W/ POLYPECTOMY    . CYSTOSCOPY     kidney stools removed   . FEMORAL-POPLITEAL BYPASS GRAFT Right 07/24/2016   Procedure: RIGHT FEMORAL TO ABOVE KNEE POPLITEAL ARTERY BYPASS GRAFT;  Surgeon: Roy Mitchell, MD;  Location: [place];  Service: Vascular;  Laterality: Right;  . LUMBAR LAMINECTOMY/DECOMPRESSION MICRODISCECTOMY Bilateral 12/03/2015   Procedure: Microdiscectomy - bilateral - Lumbar four-lumbar five;  Surgeon: Roy Larsson, MD;  Location: [place];  Service: Neurosurgery;  Laterality: Bilateral;  . PERIPHERAL VASCULAR INTERVENTION Right 06/09/2016   Procedure: Peripheral Vascular Intervention;  Surgeon: Roy Mitchell, MD;  Location: [place] [facility];  Service: Cardiovascular;  Laterality: Right;  SFA  . teeth removal     Allergies  Allergen Reactions  . No Known Allergies    Prior to Admission medications   Medication Sig Start Date End Date Taking? Authorizing Provider  ALPRAZolam Roy Moore) 1 MG tablet Take 0.5-1 tablets (0.5-1 mg total) by mouth 3 (three) times daily as needed for anxiety. 02/27/17   Roy Agreste, MD  amitriptyline (ELAVIL) 50 MG tablet Take 1 tablet (50 mg total) by mouth at bedtime. 02/27/17   Roy Agreste, MD  amoxicillin-clavulanate (AUGMENTIN) 875-125 MG tablet Take 1 tablet every 12 (twelve) hours by mouth. 12/08/16   Roy Evangelist, PA-C  clopidogrel (PLAVIX) 75 MG tablet Take 1 tablet (75 mg total) by mouth daily. 07/28/16   Roy Mitchell, MD  diclofenac (CATAFLAM) 50 MG tablet Take 50 mg 2 (two) times daily by mouth.  10/20/16   [provider]  ibuprofen (ADVIL,MOTRIN) 600 MG tablet Take 1  tablet (600 mg total) every 6 (six) hours as needed by mouth. 12/08/16   Roy Moore, Roy Fall, PA-C  lovastatin (MEVACOR) 20 MG tablet Take 1 tablet (20 mg total) by mouth at bedtime. 09/22/16   Roy Agreste, MD  oxyCODONE-acetaminophen (PERCOCET/ROXICET) 5-325 MG tablet Take 1 tablet by mouth every 6 (six) hours as needed for moderate pain. 07/26/16   Gabriel Earing, PA-C   Social History   Socioeconomic History  . Marital status: Married    Spouse name: Not on file  . Number of children: Not on file  . Years of education: Not on file  . Highest education level: Not on file  Social Needs  . Financial resource strain: Not on file  . Food insecurity - worry: Not  on file  . Food insecurity - inability: Not on file  . Transportation needs - medical: Not on file  . Transportation needs - non-medical: Not on file  Occupational History  . Not on file  Tobacco Use  . Smoking status: Current Every Day Smoker    Packs/day: 0.50    Years: 47.00    Pack years: 23.50    Types: Cigarettes  . Smokeless tobacco: Never Used  Substance and Sexual Activity  . Alcohol use: No    Alcohol/week: 0.0 oz    Comment: no alcohol in 23 years (12/02/15  . Drug use: No  . Sexual activity: Not on file  Other Topics Concern  . Not on file  Social History Narrative  . Not on file   Review of Systems  Constitutional: Negative for fatigue and unexpected weight change.  Eyes: Negative for visual disturbance.  Respiratory: Negative for cough, chest tightness and shortness of breath.   Cardiovascular: Negative for chest pain, palpitations and leg swelling.  Gastrointestinal: Negative for abdominal pain and Moore in stool.  Neurological: Negative for dizziness, light-headedness and headaches.       Objective:   Physical Exam  Constitutional: He is oriented to person, place, and time. He appears well-developed and well-nourished.  HENT:  Head: Normocephalic and atraumatic.  Nose: Right sinus exhibits  maxillary sinus tenderness. Right sinus exhibits no frontal sinus tenderness. Left sinus exhibits maxillary sinus tenderness. Left sinus exhibits no frontal sinus tenderness.  Eyes: EOM are normal. Pupils are equal, round, and reactive to light.  Neck: No JVD present. Carotid bruit is not present.  Cardiovascular: Normal rate, regular rhythm and normal heart sounds.  No murmur heard. Pulmonary/Chest: Effort normal and breath sounds normal. He has no rales.  Musculoskeletal: He exhibits no edema.  Neurological: He is alert and oriented to person, place, and time.  Skin: Skin is warm and dry.  Psychiatric: He has a normal mood and affect.  Vitals reviewed.   Vitals:   04/05/17 1616 04/05/17 1715  BP: (!) 168/80 140/70  Pulse: 75   Resp: 18   Temp: 98.3 F (36.8 C)   TempSrc: Oral   SpO2: 98%   Weight: 158 lb 9.6 oz (71.9 kg)   Height: 5\' 9"  (1.753 m)        Assessment & Plan:   DASCHEL ROUGHTON is a 62 y.o. male Elevated alkaline phosphatase level - Plan: Comprehensive metabolic panel  - repeat levels, but likely will need ultrasound as previously ordered.  Insomnia, unspecified type - Plan: amitriptyline (ELAVIL) 50 MG tablet Anxiety state  -agreed to continue Xanax XR dose for now given multiple stressors. Would still consider SSRI with goal of decreasing need for benzodiazepine.discuss further at follow-up in the next 3 months. Refill meds when due  -Discussed avoidance of benzodiazepines with narcotic pain medication and risk of overdose.He has not combined those in the past.  Chills Sinus pressure Recurrent sinusitis - Plan: amoxicillin-clavulanate (AUGMENTIN) 875-125 MG tablet  -start with Augmentin. If not improving, refer to ENT as may have component of chronic sinusitis. RTC precautions of fever or worsening symptoms  Hyperlipidemia, unspecified hyperlipidemia type - Plan: lovastatin (MEVACOR) 20 MG tablet, Comprehensive metabolic panel, Lipid panel  -Tolerating  lovastatin, check labs, continue same dose for now  Meds ordered this encounter  Medications  . lovastatin (MEVACOR) 20 MG tablet    Sig: Take 1 tablet (20 mg total) by mouth at bedtime.    Dispense:  90 tablet  Refill:  1  . amitriptyline (ELAVIL) 50 MG tablet    Sig: Take 1 tablet (50 mg total) by mouth at bedtime.    Dispense:  60 tablet    Refill:  6  . amoxicillin-clavulanate (AUGMENTIN) 875-125 MG tablet    Sig: Take 1 tablet by mouth every 12 (twelve) hours.    Dispense:  20 tablet    Refill:  0   Patient Instructions   We can try another round of antibiotic, but if sinus symptoms do not improve with current antibiotic, I would recommend meeting with an ear nose and throat specialist. Return to the clinic or go to the nearest emergency room if any of your symptoms worsen or new symptoms occur.  Your liver test was elevated in the past and worse in the emergency room. I will recheck that today, but if still elevated will need to have ultrasound we ordered last year or may need to meet with liver specialist.   I'm sorry to hear you are having some stressors in life right now. I will refill xanax for now, but will need to meet to discuss that medicine further in next 3 months. Do not take that with other sedating medicine and can not be taken with narcotic pain medications. Call when refill needed.   Return to the clinic or go to the nearest emergency room if any of your symptoms worsen or new symptoms occur.         IF you received an x-Moore today, you will receive an invoice from [facility]. Please contact [facility] at [phone] with questions or concerns regarding your invoice.   IF you received labwork today, you will receive an invoice from [facility]. Please contact [facility] at [phone] with questions or concerns regarding your invoice.   Our billing staff will not be able to assist you with questions regarding bills from these  companies.  You will be contacted with the lab results as soon as they are available. The fastest way to get your results is to activate your My Chart account. Instructions are located on the last page of this paperwork. If you have not heard from [place] regarding the results in 2 weeks, please contact this office.      I personally performed the services described in this documentation, which was scribed in my presence. The recorded information has been reviewed and considered for accuracy and completeness, addended by me as needed, and agree with information above.  Signed,   Roy Ray, MD [facility].  04/09/17 9:42 PM

## 2017-04-05 NOTE — Patient Instructions (Addendum)
We can try another round of antibiotic, but if sinus symptoms do not improve with current antibiotic, I would recommend meeting with an ear nose and throat specialist. Return to the clinic or go to the nearest emergency room if any of your symptoms worsen or new symptoms occur.  Your liver test was elevated in the past and worse in the emergency room. I will recheck that today, but if still elevated will need to have ultrasound we ordered last year or may need to meet with liver specialist.   I'm sorry to hear you are having some stressors in life right now. I will refill xanax for now, but will need to meet to discuss that medicine further in next 3 months. Do not take that with other sedating medicine and can not be taken with narcotic pain medications. Call when refill needed.   Return to the clinic or go to the nearest emergency room if any of your symptoms worsen or new symptoms occur.         IF you received an x-ray today, you will receive an invoice from [facility]. Please contact [facility] at [phone] with questions or concerns regarding your invoice.   IF you received labwork today, you will receive an invoice from [facility]. Please contact [facility] at [phone] with questions or concerns regarding your invoice.   Our billing staff will not be able to assist you with questions regarding bills from these companies.  You will be contacted with the lab results as soon as they are available. The fastest way to get your results is to activate your My Chart account. Instructions are located on the last page of this paperwork. If you have not heard from [place] regarding the results in 2 weeks, please contact this office.

## 2017-04-06 LAB — COMPREHENSIVE METABOLIC PANEL
A/G RATIO: 1.2 (ref 1.2–2.2)
ALT: 6 IU/L (ref 0–44)
AST: 17 IU/L (ref 0–40)
Albumin: 4.1 g/dL (ref 3.6–4.8)
Alkaline Phosphatase: 160 IU/L — ABNORMAL HIGH (ref 39–117)
BUN/Creatinine Ratio: 10 (ref 10–24)
BUN: 12 mg/dL (ref 8–27)
Bilirubin Total: 0.3 mg/dL (ref 0.0–1.2)
CALCIUM: 9.4 mg/dL (ref 8.6–10.2)
CO2: 24 mmol/L (ref 20–29)
CREATININE: 1.21 mg/dL (ref 0.76–1.27)
Chloride: 104 mmol/L (ref 96–106)
GFR calc Af Amer: 75 mL/min/{1.73_m2} (ref >59)
GFR, EST NON AFRICAN AMERICAN: 65 mL/min/{1.73_m2} (ref >59)
GLUCOSE: 91 mg/dL (ref 65–99)
Globulin, Total: 3.4 g/dL (ref 1.5–4.5)
POTASSIUM: 3.9 mmol/L (ref 3.5–5.2)
Sodium: 143 mmol/L (ref 134–144)
TOTAL PROTEIN: 7.5 g/dL (ref 6.0–8.5)

## 2017-04-06 LAB — LIPID PANEL
CHOL/HDL RATIO: 3.9 ratio (ref 0.0–5.0)
Cholesterol, Total: 176 mg/dL (ref 100–199)
HDL: 45 mg/dL (ref >39)
LDL Calculated: 111 mg/dL — ABNORMAL HIGH (ref 0–99)
TRIGLYCERIDES: 98 mg/dL (ref 0–149)
VLDL CHOLESTEROL CAL: 20 mg/dL (ref 5–40)

## 2017-04-19 ENCOUNTER — Encounter: Payer: Self-pay | Admitting: *Deleted

## 2017-05-03 ENCOUNTER — Other Ambulatory Visit: Payer: Self-pay | Admitting: Family Medicine

## 2017-05-03 DIAGNOSIS — F4323 Adjustment disorder with mixed anxiety and depressed mood: Secondary | ICD-10-CM

## 2017-05-03 DIAGNOSIS — G4700 Insomnia, unspecified: Secondary | ICD-10-CM

## 2017-05-03 NOTE — Telephone Encounter (Signed)
LOV:04/05/17  Dr. Arlana Pouch on file

## 2017-05-03 NOTE — Telephone Encounter (Unsigned)
Copied from [place] [id number]. Topic: Quick Communication - Rx Refill/Question >> May 03, 2017  5:07 PM Percell Belt A wrote: Medication: ALPRAZolam [name]) 1 MG tablet [[id number]] Has the patient contacted their pharmacy? No  (Agent: If no, request that the patient contact the pharmacy for the refill.) Preferred Pharmacy (with phone number or street name): [facility] [address]  Agent: Please be advised that RX refills may take up to 3 business days. We ask that you follow-up with your pharmacy.

## 2017-05-04 ENCOUNTER — Telehealth: Payer: Self-pay | Admitting: Family Medicine

## 2017-05-04 DIAGNOSIS — G4700 Insomnia, unspecified: Secondary | ICD-10-CM

## 2017-05-04 DIAGNOSIS — F4323 Adjustment disorder with mixed anxiety and depressed mood: Secondary | ICD-10-CM

## 2017-05-05 DIAGNOSIS — M961 Postlaminectomy syndrome, not elsewhere classified: Secondary | ICD-10-CM | POA: Diagnosis not present

## 2017-05-05 NOTE — Telephone Encounter (Signed)
Refilled per plan at last visit.  Discussed 52-month follow-up at that time

## 2017-05-05 NOTE — Telephone Encounter (Signed)
Xanax refill request  LOV 04/05/17 with Dr. Arlana Pouch [address] [place] - [address]

## 2017-05-06 NOTE — Telephone Encounter (Addendum)
Phone call to patient. Dervin states that he checked with his pharmacy, they haven't received medication.   Phone call to pharmacy. They state they have this medication ready for patient. Requested they notify him prescription is ready. Pharmacy agreeable.

## 2017-05-06 NOTE — Telephone Encounter (Signed)
Checking status, call back [phone]

## 2017-05-24 ENCOUNTER — Encounter (HOSPITAL_COMMUNITY): Payer: Medicare HMO

## 2017-05-24 ENCOUNTER — Ambulatory Visit: Payer: Medicare HMO | Admitting: Surgery

## 2017-05-24 ENCOUNTER — Inpatient Hospital Stay (HOSPITAL_COMMUNITY): Admission: RE | Admit: 2017-05-24 | Payer: Medicare HMO | Source: Ambulatory Visit

## 2017-06-22 DIAGNOSIS — M961 Postlaminectomy syndrome, not elsewhere classified: Secondary | ICD-10-CM | POA: Diagnosis not present

## 2017-07-02 ENCOUNTER — Telehealth: Payer: Self-pay | Admitting: Family Medicine

## 2017-07-02 NOTE — Telephone Encounter (Signed)
Per Dr. Carlota Raspberry, patient needs office visit to refill xanax. Please call patient to schedule.

## 2017-07-02 NOTE — Telephone Encounter (Signed)
Copied from [place] [id number]. Topic: Quick Communication - See Telephone Encounter >> Jul 02, 2017  9:00 AM Ether Griffins B wrote: CRM for notification. See Telephone encounter for: 07/02/17.  Pt is needing refill on ALPRAZolam (XANAX) 1 MG tablet. Please send [place], [place] - [address]. Patient is completely out medication.

## 2017-07-09 ENCOUNTER — Encounter: Payer: Self-pay | Admitting: Family Medicine

## 2017-07-09 ENCOUNTER — Ambulatory Visit (INDEPENDENT_AMBULATORY_CARE_PROVIDER_SITE_OTHER): Payer: Medicare HMO | Admitting: Family Medicine

## 2017-07-09 ENCOUNTER — Other Ambulatory Visit: Payer: Self-pay

## 2017-07-09 VITALS — BP 150/60 | HR 75 | Temp 97.6°F | Ht 69.0 in | Wt 149.0 lb

## 2017-07-09 DIAGNOSIS — F411 Generalized anxiety disorder: Secondary | ICD-10-CM | POA: Diagnosis not present

## 2017-07-09 DIAGNOSIS — G4700 Insomnia, unspecified: Secondary | ICD-10-CM

## 2017-07-09 DIAGNOSIS — R748 Abnormal levels of other serum enzymes: Secondary | ICD-10-CM

## 2017-07-09 DIAGNOSIS — I10 Essential (primary) hypertension: Secondary | ICD-10-CM

## 2017-07-09 DIAGNOSIS — M7022 Olecranon bursitis, left elbow: Secondary | ICD-10-CM

## 2017-07-09 MED ORDER — AMITRIPTYLINE HCL 50 MG PO TABS: 50.0000 mg | ORAL_TABLET | Freq: Every day | ORAL | 1 refills | Status: DC

## 2017-07-09 MED ORDER — ALPRAZOLAM 1 MG PO TABS: ORAL_TABLET | ORAL | 1 refills | Status: DC

## 2017-07-09 MED ORDER — AMLODIPINE BESYLATE 5 MG PO TABS: 5.0000 mg | ORAL_TABLET | Freq: Every day | ORAL | 1 refills | Status: DC

## 2017-07-09 NOTE — Progress Notes (Addendum)
Subjective:  By signing my name below, I, Roy Moore, attest that this documentation has been prepared under the direction and in the presence of Roy Moore, Roy Moore. Electronically Signed: [name], [place]. 07/09/2017 , 10:32 AM .  Patient was seen in Room 3 .   Patient ID: Roy Moore, male    DOB: Nov 25, 1955, 62 y.o.   MRN: [id number] Chief Complaint  Patient presents with  . Medication Refill    Xanax and Amitriptyline (lot of medication has also been lost in his move)  . PHQ9 Positive    =20  . elbow knot    left   HPI Roy Moore is a 62 y.o. male Here for follow up of anxiety.   Anxiety We had discussed SSRI's in the past, which he has declined. He was having some increased social stressors at last visit. Agreed to continue xanax at that time with strict avoidance of benzodiazepine and narcotic pain medication combination. He had not been taking overlapping times in the past. At last visit, he reports taking 2 tablets xanax 1 mg each day, additionally takes Elavil 50 mg qhs for neuropathic pain. He was last prescribed #90 xanax on April 10th, 2019.   Patient states his house was foreclosed, losing all of his belongings and currently living at a 1 bedroom apartment. He wasn't able to move all of his belongings since he wasn't able to move it all by himself due to history of back surgery. He's currently taking xanax once a day. His wife has fallen a few times and has gone to the ER a couple times now. He has money for food at home, and has been able to afford his medications. He takes oxycodone in the morning with breakfast. He usually takes his xanax around 12:00-2:00PM; has tried half tablet which somewhat takes the edge off. He takes Elavil qhs to help him sleep; denies taking xanax with it. He mentions his current living situation has bums around the area, with an occurrence of someone knocking on his door at 11:00PM asking for a cigarette. He doesn't want to try  anything new because he's been on the same regime for years now.   Elevated alkaline phosphatase It was still elevated at 160 in March but down from 235 in Nov 2018. Had planned on abdominal ultrasound but has not been done yet, and appears still hasn't been performed. 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 He denies seeing a GI recently.   Positive depression screening Depression screen [facility] 2/9 07/09/2017 04/05/2017 09/22/2016 09/18/2016 01/22/2016  Decreased Interest 3 0 0 0 0  Down, Depressed, Hopeless 3 0 0 3 0  PHQ - 2 Score 6 0 0 3 0  Altered sleeping 2 - - 0 -  Tired, decreased energy 3 - - 3 -  Change in appetite 3 - - 0 -  Feeling bad or failure about yourself  3 - - 0 -  Trouble concentrating 0 - - 0 -  Moving slowly or fidgety/restless 3 - - 0 -  Suicidal thoughts 0 - - 0 -  PHQ-9 Score 20 - - 6 -  Difficult doing work/chores Somewhat difficult - - Somewhat difficult -    Left elbow knot Patient noticed left elbow pain with some swelling over the olecranon that started about a week ago. He's had increased change in activity recently with cleaning around the house. He denies any known injury or falls.   Elevated Roy Moore: BP Readings from Last  3 Encounters:  07/09/17 (!) 150/60  04/05/17 140/70  12/08/16 (!) 149/79  He does use occasional Advil, few times per day for his pain.  Remote history of stroke.  Not currently on antihypertensives.  Roy Moore has been running slightly high past few visits.   Patient Active Problem List   Diagnosis Date Noted  . PAD (peripheral artery disease) ([place]) 07/24/2016  . Peripheral arterial disease ([place]) 07/17/2016  . Lumbar stenosis with neurogenic claudication 12/03/2015  . CVA, old, hemiparesis ([place]) 03/24/2011  . ANXIETY DISORDER 10/04/2007  . TOBACCO ABUSE 09/30/2007  . GERD 09/30/2007  . DYSLIPIDEMIA 09/15/2007  . STROKE WITH LEFT HEMIPARESIS 09/15/2007   Past Medical History:  Diagnosis Date  . Anxiety   . Complication of  anesthesia   . Hyperlipidemia   . Peripheral vascular disease ([place])   . PONV (postoperative nausea and vomiting)   . Stroke [facility]) 2009   no residual   Past Surgical History:  Procedure Laterality Date  . ABDOMINAL AORTOGRAM W/LOWER EXTREMITY [name] 06/09/2016   Procedure: Abdominal Aortogram w/Lower Extremity;  Surgeon: Serafina Mitchell, Roy Moore;  Location: [place] [facility];  Service: Cardiovascular;  Laterality: [name];  . BACK SURGERY     10 yrs ago or so-fusion in neck   . CERVICAL FUSION    . COLONOSCOPY     15-20 yrs ago-normal per pt.   . COLONOSCOPY W/ POLYPECTOMY    . CYSTOSCOPY     kidney stools removed   . FEMORAL-POPLITEAL BYPASS GRAFT Right 07/24/2016   Procedure: RIGHT FEMORAL TO ABOVE KNEE POPLITEAL ARTERY BYPASS GRAFT;  Surgeon: Serafina Mitchell, Roy Moore;  Location: [place];  Service: Vascular;  Laterality: Right;  . LUMBAR LAMINECTOMY/DECOMPRESSION MICRODISCECTOMY Bilateral 12/03/2015   Procedure: Microdiscectomy - bilateral - Lumbar four-lumbar five;  Surgeon: Earnie Larsson, Roy Moore;  Location: [place];  Service: Neurosurgery;  Laterality: Bilateral;  . PERIPHERAL VASCULAR INTERVENTION Right 06/09/2016   Procedure: Peripheral Vascular Intervention;  Surgeon: Serafina Mitchell, Roy Moore;  Location: [place] [facility];  Service: Cardiovascular;  Laterality: Right;  SFA  . teeth removal     Allergies  Allergen Reactions  . No Known Allergies    Prior to Admission medications   Medication Sig Start Date End Date Taking? Authorizing Provider  ALPRAZolam (XANAX) 1 MG tablet TAKE 1/2 TO 1 (ONE-HALF TO ONE) TABLET BY MOUTH THREE TIMES DAILY AS NEEDED FOR ANXIETY 05/05/17   [name], Roy Moore  amitriptyline (ELAVIL) 50 MG tablet Take 1 tablet (50 mg total) by mouth at bedtime. 04/05/17   [name], Roy Moore  amoxicillin-clavulanate (AUGMENTIN) 875-125 MG tablet Take 1 tablet by mouth every 12 (twelve) hours. 04/05/17   [name], Roy Moore  clopidogrel (PLAVIX) 75 MG tablet Take 1 tablet (75 mg total) by  mouth daily. 07/28/16   Serafina Mitchell, Roy Moore  diclofenac (CATAFLAM) 50 MG tablet Take 50 mg 2 (two) times daily by mouth.  10/20/16   Provider, Historical, Roy Moore  ibuprofen (ADVIL,MOTRIN) 600 MG tablet Take 1 tablet (600 mg total) every 6 (six) hours as needed by mouth. 12/08/16   Ruthann Cancer, Jesse Fall, PA-C  lovastatin (MEVACOR) 20 MG tablet Take 1 tablet (20 mg total) by mouth at bedtime. 04/05/17   [name], Roy Moore  oxyCODONE-acetaminophen (PERCOCET/ROXICET) 5-325 MG tablet Take 1 tablet by mouth every 6 (six) hours as needed for moderate pain. 07/26/16   Gabriel Earing, PA-C   Social History   Socioeconomic History  . Marital status: Married  Spouse name: Not on file  . Number of children: Not on file  . Years of education: Not on file  . Highest education level: Not on file  Occupational History  . Not on file  Social Needs  . Financial resource strain: Not on file  . Food insecurity:    Worry: Not on file    Inability: Not on file  . Transportation needs:    Medical: Not on file    Non-medical: Not on file  Tobacco Use  . Smoking status: Current Every Day Smoker    Packs/day: 0.50    Years: 47.00    Pack years: 23.50    Types: Cigarettes  . Smokeless tobacco: Never Used  Substance and Sexual Activity  . Alcohol use: No    Alcohol/week: 0.0 oz    Comment: no alcohol in 23 years (12/02/15  . Drug use: No  . Sexual activity: Not on file  Lifestyle  . Physical activity:    Days per week: Not on file    Minutes per session: Not on file  . Stress: Not on file  Relationships  . Social connections:    Talks on phone: Not on file    Gets together: Not on file    Attends religious service: Not on file    Active member of club or organization: Not on file    Attends meetings of clubs or organizations: Not on file    Relationship status: Not on file  . Intimate partner violence:    Fear of current or ex partner: Not on file    Emotionally abused: Not on file    Physically  abused: Not on file    Forced sexual activity: Not on file  Other Topics Concern  . Not on file  Social History Narrative  . Not on file   Review of Systems  Constitutional: Negative for fatigue and unexpected weight change.  Eyes: Negative for visual disturbance.  Respiratory: Negative for cough, chest tightness and shortness of breath.   Cardiovascular: Negative for chest pain, palpitations and leg swelling.  Gastrointestinal: Negative for abdominal pain and Moore in stool.  Musculoskeletal: Positive for joint swelling (left elbow).  Neurological: Negative for dizziness, light-headedness and headaches.  Psychiatric/Behavioral: The patient is nervous/anxious.        Objective:   Physical Exam  Constitutional: He is oriented to person, place, and time. He appears well-developed and well-nourished. No distress.  HENT:  Head: Normocephalic and atraumatic.  Eyes: Pupils are equal, round, and reactive to light. EOM are normal.  Neck: Neck supple.  Cardiovascular: Normal rate.  Pulmonary/Chest: Effort normal. No respiratory distress.  Abdominal: Soft. There is no tenderness.  Musculoskeletal: Normal range of motion.  Minimal soft tissue swelling over olecranon bursa, no erythema, no wounds, no warmth  Neurological: He is alert and oriented to person, place, and time.  Skin: Skin is warm and dry.  Psychiatric: He has a normal mood and affect. His behavior is normal.  Nursing note and vitals reviewed.   Vitals:   07/09/17 0907 07/09/17 0917  BP: (!) 169/77 (!) 150/60  Pulse: 75   Temp: 97.6 F (36.4 C)   TempSrc: Oral   SpO2: 100%   Weight: 149 lb (67.6 kg)   Height: 5\' 9"  (1.753 m)        Assessment & Plan:    Roy Moore is a 62 y.o. male Elevated alkaline phosphatase level - Plan: Hepatic Function Panel  -Improving on last recheck,  will check again to see if that is continued to downtrend.  Persistent elevation, or worsening, would recommend further evaluation  with ultrasound as previously discussed and possible gastroenterology or rheumatology eval  Insomnia, unspecified type - Plan: amitriptyline (ELAVIL) 50 MG tablet, ALPRAZolam (XANAX) 1 MG tablet  Long-standing insomnia that has been controlled with Elavil.  Also may be treating some of his neuropathic pain.  No change in doses.  Recommend against combining any sedating medications including Elavil and Xanax.    generalized anxiety disorder - Plan: ALPRAZolam (XANAX) 1 MG tablet  -Unfortunately has had some significant social stressors, recently losing his house and Many belongings.  Has been on Xanax for some time and overall stable at 1 to 2 pills/day daily dosing.  Have discussed SSRI in the past, but still would prefer not to change medication.  With the life changes he is currently going through agreed that Xanax may be okay as solitary option for now, but if increased need would recommend SSRI trial.  Recheck in 6 months.  Again stressed importance of not combining benzodiazepine and narcotic pain medication.  Understanding expressed.  Olecranon bursitis of left elbow  -Mild symptoms.  Likely from overuse/change in activity.  Did not appear to really require aspiration, no signs of infection at this time.  Handout given with home treatment, RTC precautions if increasing swelling for possible aspiration.  Hypertension: Few elevated readings past few visits, suspect he does have hypertension levels at this time.  With history of stroke will treat for lower goal of at least 140/90.  Start amlodipine 5 mg daily, recheck 6 months.  Potential side effects/risks and RTC precautions were discussed with medication.  Meds ordered this encounter  Medications  . amitriptyline (ELAVIL) 50 MG tablet    Sig: Take 1 tablet (50 mg total) by mouth at bedtime.    Dispense:  90 tablet    Refill:  1  . ALPRAZolam (XANAX) 1 MG tablet    Sig: TAKE 1/2 TO 1 (ONE-HALF TO ONE) TABLET BY MOUTH 2 TIMES DAILY AS NEEDED  FOR ANXIETY    Dispense:  90 tablet    Refill:  1   Patient Instructions    As we discussed, do not take xanax when oxycodone is in your system - at least 6 hours apart. I still recommend an SSRI (daily medication for anxiety).  Let me know if that is something you would be interested in taking, but can continue same medication for now. Follow up in 6 months.  I will recheck liver test today. If that is still elevated, or higher, I may refer you to gastroenterologist or may need to proceed with ultrasound that was ordered previously.   The swelling at your elbow appears to be a mild form of olecranon bursitis.  Based on appearance today I do not think you need an injection or drainage of that area at this time.  However try to avoid repetitive use of that elbow for the next few weeks, and it may feel more comfortable with the arm straight.  If increasing swelling, redness, or other worsening, return for recheck and possible injection.   Elbow Bursitis Elbow bursitis is inflammation of the fluid-filled sac (bursa) between the tip of your elbow bone (olecranon) and your skin. Elbow bursitis may also be called olecranon bursitis. Normally, the olecranon bursa has only a small amount of fluid in it to cushion and protect your elbow bone. Elbow bursitis causes fluid to build up inside the  bursa. Over time, this swelling and inflammation can cause pain when you bend or lean on your elbow. What are the causes? Elbow bursitis may be caused by:  Elbow injury (acute trauma).  Leaning on hard surfaces for long periods of time.  Infection from an injury that breaks the skin near your elbow.  A bone growth (spur) that forms at the tip of your elbow.  A medical condition that causes inflammation in your body, such as gout or rheumatoid arthritis.  The cause may also be unknown. What are the signs or symptoms? The first sign of elbow bursitis is usually swelling over the tip of your elbow. This can  grow to be the size of a golf ball. This may start suddenly or develop gradually. You may also have:  Pain when bending or leaning on your elbow.  Restricted movement of your elbow.  If your bursitis is caused by an infection, symptoms may also include:  Redness, warmth, and tenderness of the elbow.  Drainage of pus from the swollen area over your elbow, if the skin breaks open.  How is this diagnosed? Your health care provider may be able to diagnose elbow bursitis based on your signs and symptoms, especially if you have recently been injured. Your health care provider will also do a physical exam. This may include:  X-rays to look for a bone spur or a bone fracture.  Draining fluid from the bursa to test it for infection.  Moore tests to rule out gout or rheumatoid arthritis.  How is this treated? Treatment for elbow bursitis depends on the cause. Treatment may include:  Medicines. These may include: ? Over-the-counter medicines to relieve pain and inflammation. ? Antibiotic medicines to fight infection. ? Injections of anti-inflammatory medicines (steroids).  Wrapping your elbow with a bandage.  Draining fluid from the bursa.  Wearing elbow pads.  If your bursitis does not get better with treatment, surgery may be needed to remove the bursa. Follow these instructions at home:  Take medicines only as directed by your health care provider.  If you were prescribed an antibiotic medicine, finish all of it even if you start to feel better.  If your bursitis is caused by an injury, rest your elbow and wear your bandage as directed by your health care provider. You may alsoapply ice to the injured area as directed by your health care provider: ? Put ice in a plastic bag. ? Place a towel between your skin and the bag. ? Leave the ice on for 20 minutes, 2-3 times per day.  Avoid any activities that cause elbow pain.  Use elbow pads or elbow wraps to cushion your  elbow. Contact a health care provider if:  You have a fever.  Your symptoms do not get better with treatment.  Your pain or swelling gets worse.  Your elbow pain or swelling goes away and then returns.  You have drainage of pus from the swollen area over your elbow. This information is not intended to replace advice given to you by your health care provider. Make sure you discuss any questions you have with your health care provider. Document Released: 02/11/2006 Document Revised: 06/20/2015 Document Reviewed: 09/20/2013 Elsevier Interactive Patient Education  2018 [employer].    IF you received an x-Moore today, you will receive an invoice from [facility]. Please contact [facility] at [phone] with questions or concerns regarding your invoice.   IF you received labwork today, you will receive an invoice from  [facility]. Please contact [facility] at [phone] with questions or concerns regarding your invoice.   Our billing staff will not be able to assist you with questions regarding bills from these companies.  You will be contacted with the lab results as soon as they are available. The fastest way to get your results is to activate your My Chart account. Instructions are located on the last page of this paperwork. If you have not heard from [place] regarding the results in 2 weeks, please contact this office.       I personally performed the services described in this documentation, which was scribed in my presence. The recorded information has been reviewed and considered for accuracy and completeness, addended by me as needed, and agree with information above.  Signed,   Roy Moore, [facility] [place].  07/09/17 10:44 AM

## 2017-07-09 NOTE — Patient Instructions (Addendum)
As we discussed, do not take xanax when oxycodone is in your system - at least 6 hours apart. I still recommend an SSRI (daily medication for anxiety).  Let me know if that is something you would be interested in taking, but can continue same medication for now. Follow up in 6 months.  I will recheck liver test today. If that is still elevated, or higher, I may refer you to gastroenterologist or may need to proceed with ultrasound that was ordered previously.   The swelling at your elbow appears to be a mild form of olecranon bursitis.  Based on appearance today I do not think you need an injection or drainage of that area at this time.  However try to avoid repetitive use of that elbow for the next few weeks, and it may feel more comfortable with the arm straight.  If increasing swelling, redness, or other worsening, return for recheck and possible injection.  Blood pressure has been running a little bit too high.  With history of stroke I do want that to be a little bit lower.  I started you on amlodipine 1 pill once per day.  Watch for lightheadedness or dizziness with that medicine, check your blood pressure outside of the office and if it remains over 140/90, please return to discuss medication changes.  Follow-up in 6 months.   Elbow Bursitis Elbow bursitis is inflammation of the fluid-filled sac (bursa) between the tip of your elbow bone (olecranon) and your skin. Elbow bursitis may also be called olecranon bursitis. Normally, the olecranon bursa has only a small amount of fluid in it to cushion and protect your elbow bone. Elbow bursitis causes fluid to build up inside the bursa. Over time, this swelling and inflammation can cause pain when you bend or lean on your elbow. What are the causes? Elbow bursitis may be caused by:  Elbow injury (acute trauma).  Leaning on hard surfaces for long periods of time.  Infection from an injury that breaks the skin near your elbow.  A bone growth  (spur) that forms at the tip of your elbow.  A medical condition that causes inflammation in your body, such as gout or rheumatoid arthritis.  The cause may also be unknown. What are the signs or symptoms? The first sign of elbow bursitis is usually swelling over the tip of your elbow. This can grow to be the size of a golf ball. This may start suddenly or develop gradually. You may also have:  Pain when bending or leaning on your elbow.  Restricted movement of your elbow.  If your bursitis is caused by an infection, symptoms may also include:  Redness, warmth, and tenderness of the elbow.  Drainage of pus from the swollen area over your elbow, if the skin breaks open.  How is this diagnosed? Your health care provider may be able to diagnose elbow bursitis based on your signs and symptoms, especially if you have recently been injured. Your health care provider will also do a physical exam. This may include:  X-rays to look for a bone spur or a bone fracture.  Draining fluid from the bursa to test it for infection.  Blood tests to rule out gout or rheumatoid arthritis.  How is this treated? Treatment for elbow bursitis depends on the cause. Treatment may include:  Medicines. These may include: ? Over-the-counter medicines to relieve pain and inflammation. ? Antibiotic medicines to fight infection. ? Injections of anti-inflammatory medicines (steroids).  Wrapping your elbow  with a bandage.  Draining fluid from the bursa.  Wearing elbow pads.  If your bursitis does not get better with treatment, surgery may be needed to remove the bursa. Follow these instructions at home:  Take medicines only as directed by your health care provider.  If you were prescribed an antibiotic medicine, finish all of it even if you start to feel better.  If your bursitis is caused by an injury, rest your elbow and wear your bandage as directed by your health care provider. You may alsoapply  ice to the injured area as directed by your health care provider: ? Put ice in a plastic bag. ? Place a towel between your skin and the bag. ? Leave the ice on for 20 minutes, 2-3 times per day.  Avoid any activities that cause elbow pain.  Use elbow pads or elbow wraps to cushion your elbow. Contact a health care provider if:  You have a fever.  Your symptoms do not get better with treatment.  Your pain or swelling gets worse.  Your elbow pain or swelling goes away and then returns.  You have drainage of pus from the swollen area over your elbow. This information is not intended to replace advice given to you by your health care provider. Make sure you discuss any questions you have with your health care provider. Document Released: 02/11/2006 Document Revised: 06/20/2015 Document Reviewed: 09/20/2013 Elsevier Interactive Patient Education  2018 [employer].    IF you received an x-ray today, you will receive an invoice from [facility]. Please contact [facility] Radiology at [phone] with questions or concerns regarding your invoice.   IF you received labwork today, you will receive an invoice from [place]. Please contact [facility] at [phone] with questions or concerns regarding your invoice.   Our billing staff will not be able to assist you with questions regarding bills from these companies.  You will be contacted with the lab results as soon as they are available. The fastest way to get your results is to activate your My Chart account. Instructions are located on the last page of this paperwork. If you have not heard from [place] regarding the results in 2 weeks, please contact this office.

## 2017-07-10 LAB — HEPATIC FUNCTION PANEL
ALBUMIN: 4.1 g/dL (ref 3.6–4.8)
ALK PHOS: 88 IU/L (ref 39–117)
ALT: 6 IU/L (ref 0–44)
AST: 14 IU/L (ref 0–40)
BILIRUBIN TOTAL: 0.3 mg/dL (ref 0.0–1.2)
Bilirubin, Direct: 0.11 mg/dL (ref 0.00–0.40)
TOTAL PROTEIN: 7.3 g/dL (ref 6.0–8.5)

## 2017-07-26 DIAGNOSIS — I10 Essential (primary) hypertension: Secondary | ICD-10-CM | POA: Diagnosis not present

## 2017-07-26 DIAGNOSIS — M48062 Spinal stenosis, lumbar region with neurogenic claudication: Secondary | ICD-10-CM | POA: Diagnosis not present

## 2017-07-26 DIAGNOSIS — M961 Postlaminectomy syndrome, not elsewhere classified: Secondary | ICD-10-CM | POA: Diagnosis not present

## 2017-07-31 ENCOUNTER — Encounter: Payer: Self-pay | Admitting: Radiology

## 2017-10-20 DIAGNOSIS — M961 Postlaminectomy syndrome, not elsewhere classified: Secondary | ICD-10-CM | POA: Diagnosis not present

## 2017-10-20 DIAGNOSIS — M48062 Spinal stenosis, lumbar region with neurogenic claudication: Secondary | ICD-10-CM | POA: Diagnosis not present

## 2017-10-20 DIAGNOSIS — I10 Essential (primary) hypertension: Secondary | ICD-10-CM | POA: Diagnosis not present

## 2017-10-27 ENCOUNTER — Other Ambulatory Visit: Payer: Self-pay | Admitting: Family Medicine

## 2017-10-27 DIAGNOSIS — F411 Generalized anxiety disorder: Secondary | ICD-10-CM

## 2017-10-27 DIAGNOSIS — G4700 Insomnia, unspecified: Secondary | ICD-10-CM

## 2017-10-27 NOTE — Telephone Encounter (Signed)
Requested medication (s) are due for refill today: yes  Requested medication (s) are on the active medication list: yes    Last refill: 09/25/17  Future visit scheduled yes..12/2017  Notes to clinic:  Requested Prescriptions  Pending Prescriptions Disp Refills   ALPRAZolam (XANAX) 1 MG tablet [Pharmacy Med Name: ALPRAZolam 1MG       TAB] 90 tablet 1    Sig: TAKE 0.5-1 (ONE-HALF TO ONE) TABLET BY MOUTH TWICE DAILY AS NEEDED FOR  ANXIETY     Not Delegated - Psychiatry:  Anxiolytics/Hypnotics Failed - 10/27/2017  3:00 PM      Failed - This refill cannot be delegated      Failed - Urine Drug Screen completed in last 360 days.      Passed - Valid encounter within last 6 months    Recent Outpatient Visits          3 months ago Elevated alkaline phosphatase level   [facility] [place], MD   6 months ago Elevated alkaline phosphatase level   [facility], Ranell Patrick, MD   1 year ago Elevated alkaline phosphatase level   [facility], Ranell Patrick, MD   1 year ago Hyperlipidemia, unspecified hyperlipidemia type   [facility], Ranell Patrick, MD   1 year ago Tobacco use disorder   [facility], Fenton Malling, MD      Future Appointments            In 2 months Carlota Raspberry Ranell Patrick, MD [facility] [place], [facility]

## 2017-10-28 NOTE — Telephone Encounter (Signed)
See last OV in June - due for follow up in December. meds refilled.

## 2018-01-10 ENCOUNTER — Other Ambulatory Visit: Payer: Self-pay

## 2018-01-10 ENCOUNTER — Encounter: Payer: Self-pay | Admitting: Family Medicine

## 2018-01-10 ENCOUNTER — Ambulatory Visit (INDEPENDENT_AMBULATORY_CARE_PROVIDER_SITE_OTHER): Payer: Medicare HMO | Admitting: Family Medicine

## 2018-01-10 VITALS — BP 154/60 | HR 78 | Temp 98.4°F | Ht 69.0 in | Wt 147.2 lb

## 2018-01-10 DIAGNOSIS — E785 Hyperlipidemia, unspecified: Secondary | ICD-10-CM | POA: Diagnosis not present

## 2018-01-10 DIAGNOSIS — G4700 Insomnia, unspecified: Secondary | ICD-10-CM

## 2018-01-10 DIAGNOSIS — I739 Peripheral vascular disease, unspecified: Secondary | ICD-10-CM | POA: Diagnosis not present

## 2018-01-10 DIAGNOSIS — J320 Chronic maxillary sinusitis: Secondary | ICD-10-CM | POA: Diagnosis not present

## 2018-01-10 DIAGNOSIS — I10 Essential (primary) hypertension: Secondary | ICD-10-CM

## 2018-01-10 MED ORDER — CLOPIDOGREL BISULFATE 75 MG PO TABS: 75.0000 mg | ORAL_TABLET | Freq: Every day | ORAL | 11 refills | Status: DC

## 2018-01-10 MED ORDER — AMITRIPTYLINE HCL 50 MG PO TABS: 50.0000 mg | ORAL_TABLET | Freq: Every day | ORAL | 1 refills | Status: DC

## 2018-01-10 MED ORDER — AMOXICILLIN-POT CLAVULANATE 875-125 MG PO TABS: 1.0000 | ORAL_TABLET | Freq: Two times a day (BID) | ORAL | 0 refills | Status: DC

## 2018-01-10 MED ORDER — LOVASTATIN 20 MG PO TABS: 20.0000 mg | ORAL_TABLET | Freq: Every day | ORAL | 1 refills | Status: DC

## 2018-01-10 MED ORDER — AMLODIPINE BESYLATE 5 MG PO TABS: 5.0000 mg | ORAL_TABLET | Freq: Every day | ORAL | 1 refills | Status: DC

## 2018-01-10 MED ORDER — SERTRALINE HCL 25 MG PO TABS: 25.0000 mg | ORAL_TABLET | Freq: Every day | ORAL | 1 refills | Status: DC

## 2018-01-10 NOTE — Progress Notes (Signed)
Subjective:    Patient ID: Roy Moore, male    DOB: [birth year], 62 y.o.   MRN: [id number]  HPI Roy Moore is a 62 y.o. male Presents today for: Chief Complaint  Patient presents with  . Medication Refill    all med need to be refilled. (totally out of Plavix, Elavil, norvasic)  . Nasal Congestion    1 month (need tissue all the time)   Hx of CVA with residual left hemiparesis:  - on Plavix 8m QD, ran out few days ago.   - no new weakness. No new bleeding.   - appt with neurologist was scheduled today - has to reschedule.   Anxiety:  - difficulty with sleep in past - taking Elavil 572mQHS.   - still taking 2 Xanax per day - taking 7m77mn am, 1 at night.  Increased social stressors at last visit. Deferred SSRI at that time. Has new place to live now.    - agrees to try SSRI, but worried about anxiety attacks that occur 1-2 times per day.   - excited that [place] [employer] - no alcohol  - no IDU.  [facility] 07/09/2017 04/05/2017 09/22/2016 09/18/2016 01/22/2016  Decreased Interest 3 0 0 0 0  Down, Depressed, Hopeless 3 0 0 3 0  PHQ - 2 Score 6 0 0 3 0  Altered sleeping 2 - - 0 -  Tired, decreased energy 3 - - 3 -  Change in appetite 3 - - 0 -  Feeling bad or failure about yourself  3 - - 0 -  Trouble concentrating 0 - - 0 -  Moving slowly or fidgety/restless 3 - - 0 -  Suicidal thoughts 0 - - 0 -  PHQ-9 Score 20 - - 6 -  Difficult doing work/chores Somewhat difficult - - Somewhat difficult -     Hyperlipidemia:  Lab Results  Component Value Date   CHOL 176 04/05/2017   HDL 45 04/05/2017   LDLCALC 111 (H) 04/05/2017   TRIG 98 04/05/2017   CHOLHDL 3.9 04/05/2017   Lab Results  Component Value Date   ALT 6 07/09/2017   AST 14 07/09/2017   GGT 175 (H) 10/08/2016   ALKPHOS 88 07/09/2017   BILITOT 0.3 07/09/2017  on lovastatin, but ran out 1 week ago.   Hypertension: BP Readings from Last 3 Encounters:    01/10/18 (!) 154/60  07/09/17 (!) 150/60  04/05/17 140/70   Lab Results  Component Value Date   CREATININE 1.21 04/05/2017  out of meds for past 1 week. No SE's on meds.    Sinus congestion past 2 to 3 weeks with discolored nasal discharge, more sinus pressure past week.  No treatments.  History of sinusitis in the past.  Patient Active Problem List   Diagnosis Date Noted  . PAD (peripheral artery disease) ([facility]/29/2018  . Peripheral arterial disease ([facility]CC[place]6/22/2018  . Lumbar stenosis with neurogenic claudication 12/03/2015  . CVA, old, hemiparesis ([place]/26/2013  . ANXIETY DISORDER 10/04/2007  . TOBACCO ABUSE 09/30/2007  . GERD 09/30/2007  . DYSLIPIDEMIA 09/15/2007  . STROKE WITH LEFT HEMIPARESIS 09/15/2007   Past Medical History:  Diagnosis Date  . Anxiety   . Complication of anesthesia   . Hyperlipidemia   . Peripheral vascular disease ([place] . PONV (postoperative nausea and vomiting)   . Stroke ([facility]   no residual   Past Surgical  History:  Procedure Laterality Date  . ABDOMINAL AORTOGRAM W/LOWER EXTREMITY [name] 06/09/2016   Procedure: Abdominal Aortogram w/Lower Extremity;  Surgeon: Serafina Mitchell, MD;  Location: [facility];  Service: Cardiovascular;  Laterality: [name];  . BACK SURGERY     10 yrs ago or so-fusion in neck   . CERVICAL FUSION    . COLONOSCOPY     15-20 yrs ago-normal per pt.   . COLONOSCOPY W/ POLYPECTOMY    . CYSTOSCOPY     kidney stools removed   . FEMORAL-POPLITEAL BYPASS GRAFT Right 07/24/2016   Procedure: RIGHT FEMORAL TO ABOVE KNEE POPLITEAL ARTERY BYPASS GRAFT;  Surgeon: Serafina Mitchell, MD;  Location: [facility];  Service: Vascular;  Laterality: Right;  . LUMBAR LAMINECTOMY/DECOMPRESSION MICRODISCECTOMY Bilateral 12/03/2015   Procedure: Microdiscectomy - bilateral - Lumbar four-lumbar five;  Surgeon: Earnie Larsson, MD;  Location: [facility];  Service: Neurosurgery;  Laterality: Bilateral;  . PERIPHERAL VASCULAR INTERVENTION Right  06/09/2016   Procedure: Peripheral Vascular Intervention;  Surgeon: Serafina Mitchell, MD;  Location: [place] [facility];  Service: Cardiovascular;  Laterality: Right;  SFA  . teeth removal     Allergies  Allergen Reactions  . No Known Allergies    Prior to Admission medications   Medication Sig Start Date End Date Taking? Authorizing Provider  ALPRAZolam [name]) 1 MG tablet TAKE 0.5-1 (ONE-HALF TO ONE) TABLET BY MOUTH TWICE DAILY AS NEEDED FOR  ANXIETY 10/28/17  Yes [name], MD  amitriptyline (ELAVIL) 50 MG tablet Take 1 tablet (50 mg total) by mouth at bedtime. 07/09/17  Yes [name], MD  amLODipine (NORVASC) 5 MG tablet Take 1 tablet (5 mg total) by mouth daily. 07/09/17  Yes [name], MD  clopidogrel (PLAVIX) 75 MG tablet Take 1 tablet (75 mg total) by mouth daily. 07/28/16  Yes Serafina Mitchell, MD  diclofenac (CATAFLAM) 50 MG tablet Take 50 mg 2 (two) times daily by mouth.  10/20/16  Yes [provider]  ibuprofen (ADVIL,MOTRIN) 600 MG tablet Take 1 tablet (600 mg total) every 6 (six) hours as needed by mouth. 12/08/16  Yes Ruthann Cancer, Jesse Fall, PA-C  lovastatin (MEVACOR) 20 MG tablet Take 1 tablet (20 mg total) by mouth at bedtime. 04/05/17  Yes [name], MD  oxyCODONE-acetaminophen (PERCOCET/ROXICET) 5-325 MG tablet Take 1 tablet by mouth every 6 (six) hours as needed for moderate pain. 07/26/16  Yes Rhyne, Hulen Shouts, PA-C   Social History   Socioeconomic History  . Marital status: Married    Spouse name: Not on file  . Number of children: Not on file  . Years of education: Not on file  . Highest education level: Not on file  Occupational History  . Not on file  Social Needs  . Financial resource strain: Not on file  . Food insecurity:    Worry: Not on file    Inability: Not on file  . Transportation needs:    Medical: Not on file    Non-medical: Not on file  Tobacco Use  . Smoking status: Current Every Day Smoker    Packs/day: 0.50     Years: 47.00    Pack years: 23.50    Types: Cigarettes  . Smokeless tobacco: Never Used  Substance and Sexual Activity  . Alcohol use: No    Alcohol/week: 0.0 standard drinks    Comment: no alcohol in 23 years (12/02/15  . Drug use: No  . Sexual activity: Not on file  Lifestyle  . Physical activity:    Days per week: Not on file    Minutes per session: Not on file  . Stress: Not on file  Relationships  . Social connections:    Talks on phone: Not on file    Gets together: Not on file    Attends religious service: Not on file    Active member of club or organization: Not on file    Attends meetings of clubs or organizations: Not on file    Relationship status: Not on file  . Intimate partner violence:    Fear of current or ex partner: Not on file    Emotionally abused: Not on file    Physically abused: Not on file    Forced sexual activity: Not on file  Other Topics Concern  . Not on file  Social History Narrative  . Not on file    Review of Systems Per HPI    Objective:   Physical Exam Vitals signs reviewed.  Constitutional:      Appearance: He is well-developed.  HENT:     Head: Normocephalic and atraumatic.     Right Ear: Tympanic membrane, ear canal and external ear normal.     Left Ear: Tympanic membrane, ear canal and external ear normal.     Nose: No rhinorrhea.     Right Sinus: Maxillary sinus tenderness present.     Left Sinus: Maxillary sinus tenderness present.     Mouth/Throat:     Pharynx: No oropharyngeal exudate or posterior oropharyngeal erythema.  Eyes:     Conjunctiva/sclera: Conjunctivae normal.     Pupils: Pupils are equal, round, and reactive to light.  Neck:     Musculoskeletal: Neck supple.     Vascular: No carotid bruit or JVD.  Cardiovascular:     Rate and Rhythm: Normal rate and regular rhythm.     Heart sounds: Normal heart sounds. No murmur.  Pulmonary:     Effort: Pulmonary effort is normal.     Breath sounds: Normal breath  sounds. No wheezing, rhonchi or rales.  Abdominal:     Palpations: Abdomen is soft.     Tenderness: There is no abdominal tenderness.  Lymphadenopathy:     Cervical: No cervical adenopathy.  Skin:    General: Skin is warm and dry.     Findings: No rash.  Neurological:     Mental Status: He is alert and oriented to person, place, and time.  Psychiatric:        Behavior: Behavior normal.    Vitals:   01/10/18 1408 01/10/18 1414  BP: (!) 156/66 (!) 154/60  Pulse: 78   Temp: 98.4 F (36.9 C)   TempSrc: Oral   SpO2: 96%   Weight: 147 lb 3.2 oz (66.8 kg)   Height: _0  (1.753 m)       Assessment & Plan:    Roy Moore is a 62 y.o. male Essential hypertension - Plan: Lipid panel, CMP14+EGFR, amLODipine (NORVASC) 5 MG tablet  - restart prior med and importance of follow up on meds discussed.   - labs above.   Insomnia, unspecified type - Plan: amitriptyline (ELAVIL) 50 MG tablet  - no changes, stable.   PVD (peripheral vascular disease) with claudication ([place]) - Plan: clopidogrel (PLAVIX) 75 MG tablet  - restart Plavix, follow up with neuro.   Hyperlipidemia, unspecified hyperlipidemia type - Plan: lovastatin (MEVACOR) 20 MG tablet  - labs obtained, but may need recheck in 6 weeks  of continued meds. Restart mevacor.   Maxillary sinusitis, unspecified chronicity - Plan: amoxicillin-clavulanate (AUGMENTIN) 875-125 MG tablet  - augmentin Rx, potential side effects, risks discussed. Symptomatic care discussed and RTC precautions.   Meds ordered this encounter  Medications  . sertraline (ZOLOFT) 25 MG tablet    Sig: Take 1 tablet (25 mg total) by mouth daily.    Dispense:  30 tablet    Refill:  1  . amitriptyline (ELAVIL) 50 MG tablet    Sig: Take 1 tablet (50 mg total) by mouth at bedtime.    Dispense:  90 tablet    Refill:  1  . amLODipine (NORVASC) 5 MG tablet    Sig: Take 1 tablet (5 mg total) by mouth daily.    Dispense:  90 tablet    Refill:  1  .  clopidogrel (PLAVIX) 75 MG tablet    Sig: Take 1 tablet (75 mg total) by mouth daily.    Dispense:  30 tablet    Refill:  11  . lovastatin (MEVACOR) 20 MG tablet    Sig: Take 1 tablet (20 mg total) by mouth at bedtime.    Dispense:  90 tablet    Refill:  1  . amoxicillin-clavulanate (AUGMENTIN) 875-125 MG tablet    Sig: Take 1 tablet by mouth 2 (two) times daily.    Dispense:  20 tablet    Refill:  0   Patient Instructions   Try starting Zoloft 1 pill once per day as I think that medication will help with anxiety and lessen anxiety attacks.  Recheck with me in the next 4 weeks so we can determine how that is working and the dosage changes.  Okay to continue Xanax up to twice per day for now  I refilled the Plavix but please call and schedule appointment with your neurologist.   I will recheck your cholesterol today, but it might be elevated as you have been off medications recently.  We can recheck it in the next 6 weeks once you have been on medicines every day to determine if changes needed.   Make sure to be seen before running out of meds, or call for temporary refill if needed prior to appointment  Augmentin twice per day for sinusitis.  Return to the clinic or go to the nearest emergency room if any of your symptoms worsen or new symptoms occur.   Generalized Anxiety Disorder, Adult Generalized anxiety disorder (GAD) is a mental health disorder. People with this condition constantly worry about everyday events. Unlike normal anxiety, worry related to GAD is not triggered by a specific event. These worries also do not fade or get better with time. GAD interferes with life functions, including relationships, work, and school. GAD can vary from mild to severe. People with severe GAD can have intense waves of anxiety with physical symptoms (panic attacks). What are the causes? The exact cause of GAD is not known. What increases the risk? This condition is more likely to develop  in:  Women.  People who have a family history of anxiety disorders.  People who are very shy.  People who experience very stressful life events, such as the death of a loved one.  People who have a very stressful family environment.  What are the signs or symptoms? People with GAD often worry excessively about many things in their lives, such as their health and family. They may also be overly concerned about:  Doing well at work.  Being on time.  Natural disasters.  Friendships.  Physical symptoms of GAD include:  Fatigue.  Muscle tension or having muscle twitches.  Trembling or feeling shaky.  Being easily startled.  Feeling like your heart is pounding or racing.  Feeling out of breath or like you cannot take a deep breath.  Having trouble falling asleep or staying asleep.  Sweating.  Nausea, diarrhea, or irritable bowel syndrome (IBS).  Headaches.  Trouble concentrating or remembering facts.  Restlessness.  Irritability.  How is this diagnosed? Your health care provider can diagnose GAD based on your symptoms and medical history. You will also have a physical exam. The health care provider will ask specific questions about your symptoms, including how severe they are, when they started, and if they come and go. Your health care provider may ask you about your use of alcohol or drugs, including prescription medicines. Your health care provider may refer you to a mental health specialist for further evaluation. Your health care provider will do a thorough examination and may perform additional tests to rule out other possible causes of your symptoms. To be diagnosed with GAD, a person must have anxiety that:  Is out of his or her control.  Affects several different aspects of his or her life, such as work and relationships.  Causes distress that makes him or her unable to take part in normal activities.  Includes at least three physical symptoms of GAD,  such as restlessness, fatigue, trouble concentrating, irritability, muscle tension, or sleep problems.  Before your health care provider can confirm a diagnosis of GAD, these symptoms must be present more days than they are not, and they must last for six months or longer. How is this treated? The following therapies are usually used to treat GAD:  Medicine. Antidepressant medicine is usually prescribed for long-term daily control. Antianxiety medicines may be added in severe cases, especially when panic attacks occur.  Talk therapy (psychotherapy). Certain types of talk therapy can be helpful in treating GAD by providing support, education, and guidance. Options include: ? Cognitive behavioral therapy (CBT). People learn coping skills and techniques to ease their anxiety. They learn to identify unrealistic or negative thoughts and behaviors and to replace them with positive ones. ? Acceptance and commitment therapy (ACT). This treatment teaches people how to be mindful as a way to cope with unwanted thoughts and feelings. ? Biofeedback. This process trains you to manage your body's response (physiological response) through breathing techniques and relaxation methods. You will work with a therapist while machines are used to monitor your physical symptoms.  Stress management techniques. These include yoga, meditation, and exercise.  A mental health specialist can help determine which treatment is best for you. Some people see improvement with one type of therapy. However, other people require a combination of therapies. Follow these instructions at home:  Take over-the-counter and prescription medicines only as told by your health care provider.  Try to maintain a normal routine.  Try to anticipate stressful situations and allow extra time to manage them.  Practice any stress management or self-calming techniques as taught by your health care provider.  Do not punish yourself for setbacks  or for not making progress.  Try to recognize your accomplishments, even if they are small.  Keep all follow-up visits as told by your health care provider. This is important. Contact a health care provider if:  Your symptoms do not get better.  Your symptoms get worse.  You have signs of depression, such as: ?  A persistently sad, cranky, or irritable mood. ? Loss of enjoyment in activities that used to bring you joy. ? Change in weight or eating. ? Changes in sleeping habits. ? Avoiding friends or family members. ? Loss of energy for normal tasks. ? Feelings of guilt or worthlessness. Get help right away if:  You have serious thoughts about hurting yourself or others. If you ever feel like you may hurt yourself or others, or have thoughts about taking your own life, get help right away. You can go to your nearest emergency department or call:  Your local emergency services (911 in the [place]  A suicide crisis helpline, such as the [facility] at 657-472-8397. This is open 24 hours a day.  Summary  Generalized anxiety disorder (GAD) is a mental health disorder that involves worry that is not triggered by a specific event.  People with GAD often worry excessively about many things in their lives, such as their health and family.  GAD may cause physical symptoms such as restlessness, trouble concentrating, sleep problems, frequent sweating, nausea, diarrhea, headaches, and trembling or muscle twitching.  A mental health specialist can help determine which treatment is best for you. Some people see improvement with one type of therapy. However, other people require a combination of therapies. This information is not intended to replace advice given to you by your health care provider. Make sure you discuss any questions you have with your health care provider. Document Released: 05/09/2012 Document Revised: 12/03/2015 Document Reviewed:  12/03/2015 Elsevier Interactive Patient Education  2018 [employer].  Sinusitis, Adult Sinusitis is soreness and inflammation of your sinuses. Sinuses are hollow spaces in the bones around your face. Your sinuses are located:  Around your eyes.  In the middle of your forehead.  Behind your nose.  In your cheekbones.  Your sinuses and nasal passages are lined with a stringy fluid (mucus). Mucus normally drains out of your sinuses. When your nasal tissues become inflamed or swollen, the mucus can become trapped or blocked so air cannot flow through your sinuses. This allows bacteria, viruses, and funguses to grow, which leads to infection. Sinusitis can develop quickly and last for 7?10 days (acute) or for more than 12 weeks (chronic). Sinusitis often develops after a cold. What are the causes? This condition is caused by anything that creates swelling in the sinuses or stops mucus from draining, including:  Allergies.  Asthma.  Bacterial or viral infection.  Abnormally shaped bones between the nasal passages.  Nasal growths that contain mucus (nasal polyps).  Narrow sinus openings.  Pollutants, such as chemicals or irritants in the air.  A foreign object stuck in the nose.  A fungal infection. This is rare.  What increases the risk? The following factors may make you more likely to develop this condition:  Having allergies or asthma.  Having had a recent cold or respiratory tract infection.  Having structural deformities or blockages in your nose or sinuses.  Having a weak immune system.  Doing a lot of swimming or diving.  Overusing nasal sprays.  Smoking.  What are the signs or symptoms? The main symptoms of this condition are pain and a feeling of pressure around the affected sinuses. Other symptoms include:  Upper toothache.  Earache.  Headache.  Bad breath.  Decreased sense of smell and taste.  A cough that may get worse at  night.  Fatigue.  Fever.  Thick drainage from your nose. The drainage is often green and  it may contain pus (purulent).  Stuffy nose or congestion.  Postnasal drip. This is when extra mucus collects in the throat or back of the nose.  Swelling and warmth over the affected sinuses.  Sore throat.  Sensitivity to light.  How is this diagnosed? This condition is diagnosed based on symptoms, a medical history, and a physical exam. To find out if your condition is acute or chronic, your health care provider may:  Look in your nose for signs of nasal polyps.  Tap over the affected sinus to check for signs of infection.  View the inside of your sinuses using an imaging device that has a light attached (endoscope).  If your health care provider suspects that you have chronic sinusitis, you may also:  Be tested for allergies.  Have a sample of mucus taken from your nose (nasal culture) and checked for bacteria.  Have a mucus sample examined to see if your sinusitis is related to an allergy.  If your sinusitis does not respond to treatment and it lasts longer than 8 weeks, you may have an MRI or CT scan to check your sinuses. These scans also help to determine how severe your infection is. In rare cases, a bone biopsy may be done to rule out more serious types of fungal sinus disease. How is this treated? Treatment for sinusitis depends on the cause and whether your condition is chronic or acute. If a virus is causing your sinusitis, your symptoms will go away on their own within 10 days. You may be given medicines to relieve your symptoms, including:  Topical nasal decongestants. They shrink swollen nasal passages and let mucus drain from your sinuses.  Antihistamines. These drugs block inflammation that is triggered by allergies. This can help to ease swelling in your nose and sinuses.  Topical nasal corticosteroids. These are nasal sprays that ease inflammation and swelling in  your nose and sinuses.  Nasal saline washes. These rinses can help to get rid of thick mucus in your nose.  If your condition is caused by bacteria, you will be given an antibiotic medicine. If your condition is caused by a fungus, you will be given an antifungal medicine. Surgery may be needed to correct underlying conditions, such as narrow nasal passages. Surgery may also be needed to remove polyps. Follow these instructions at home: Medicines  Take, use, or apply over-the-counter and prescription medicines only as told by your health care provider. These may include nasal sprays.  If you were prescribed an antibiotic medicine, take it as told by your health care provider. Do not stop taking the antibiotic even if you start to feel better. Hydrate and Humidify  Drink enough water to keep your urine clear or pale yellow. Staying hydrated will help to thin your mucus.  Use a cool mist humidifier to keep the humidity level in your home above 50%.  Inhale steam for 10-15 minutes, 3-4 times a day or as told by your health care provider. You can do this in the bathroom while a hot shower is running.  Limit your exposure to cool or dry air. Rest  Rest as much as possible.  Sleep with your head raised (elevated).  Make sure to get enough sleep each night. General instructions  Apply a warm, moist washcloth to your face 3-4 times a day or as told by your health care provider. This will help with discomfort.  Wash your hands often with soap and water to reduce your exposure to viruses  and other germs. If soap and water are not available, use hand sanitizer.  Do not smoke. Avoid being around people who are smoking (secondhand smoke).  Keep all follow-up visits as told by your health care provider. This is important. Contact a health care provider if:  You have a fever.  Your symptoms get worse.  Your symptoms do not improve within 10 days. Get help right away if:  You have a  severe headache.  You have persistent vomiting.  You have pain or swelling around your face or eyes.  You have vision problems.  You develop confusion.  Your neck is stiff.  You have trouble breathing. This information is not intended to replace advice given to you by your health care provider. Make sure you discuss any questions you have with your health care provider. Document Released: 01/12/2005 Document Revised: 09/08/2015 Document Reviewed: 11/07/2014 Elsevier Interactive Patient Education  [employer].  If you have lab work done today you will be contacted with your lab results within the next 2 weeks.  If you have not heard from [place] then please contact us. The fastest way to get your results is to register for My Chart.   IF you received an x-ray today, you will receive an invoice from [facility]. Please contact [facility] at [phone] with questions or concerns regarding your invoice.   IF you received labwork today, you will receive an invoice from [facility]. Please contact [facility] at [phone] with questions or concerns regarding your invoice.   Our billing staff will not be able to assist you with questions regarding bills from these companies.  You will be contacted with the lab results as soon as they are available. The fastest way to get your results is to activate your My Chart account. Instructions are located on the last page of this paperwork. If you have not heard from [place] regarding the results in 2 weeks, please contact this office.       Signed,   [name], MD [facility].  01/16/18 11:33 PM

## 2018-01-10 NOTE — Patient Instructions (Addendum)
Try starting Zoloft 1 pill once per day as I think that medication will help with anxiety and lessen anxiety attacks.  Recheck with me in the next 4 weeks so we can determine how that is working and the dosage changes.  Okay to continue Xanax up to twice per day for now  I refilled the Plavix but please call and schedule appointment with your neurologist.   I will recheck your cholesterol today, but it might be elevated as you have been off medications recently.  We can recheck it in the next 6 weeks once you have been on medicines every day to determine if changes needed.   Make sure to be seen before running out of meds, or call for temporary refill if needed prior to appointment  Augmentin twice per day for sinusitis.  Return to the clinic or go to the nearest emergency room if any of your symptoms worsen or new symptoms occur.   Generalized Anxiety Disorder, Adult Generalized anxiety disorder (GAD) is a mental health disorder. People with this condition constantly worry about everyday events. Unlike normal anxiety, worry related to GAD is not triggered by a specific event. These worries also do not fade or get better with time. GAD interferes with life functions, including relationships, work, and school. GAD can vary from mild to severe. People with severe GAD can have intense waves of anxiety with physical symptoms (panic attacks). What are the causes? The exact cause of GAD is not known. What increases the risk? This condition is more likely to develop in:  Women.  People who have a family history of anxiety disorders.  People who are very shy.  People who experience very stressful life events, such as the death of a loved one.  People who have a very stressful family environment.  What are the signs or symptoms? People with GAD often worry excessively about many things in their lives, such as their health and family. They may also be overly concerned about:  Doing well at  work.  Being on time.  Natural disasters.  Friendships.  Physical symptoms of GAD include:  Fatigue.  Muscle tension or having muscle twitches.  Trembling or feeling shaky.  Being easily startled.  Feeling like your heart is pounding or racing.  Feeling out of breath or like you cannot take a deep breath.  Having trouble falling asleep or staying asleep.  Sweating.  Nausea, diarrhea, or irritable bowel syndrome (IBS).  Headaches.  Trouble concentrating or remembering facts.  Restlessness.  Irritability.  How is this diagnosed? Your health care provider can diagnose GAD based on your symptoms and medical history. You will also have a physical exam. The health care provider will ask specific questions about your symptoms, including how severe they are, when they started, and if they come and go. Your health care provider may ask you about your use of alcohol or drugs, including prescription medicines. Your health care provider may refer you to a mental health specialist for further evaluation. Your health care provider will do a thorough examination and may perform additional tests to rule out other possible causes of your symptoms. To be diagnosed with GAD, a person must have anxiety that:  Is out of his or her control.  Affects several different aspects of his or her life, such as work and relationships.  Causes distress that makes him or her unable to take part in normal activities.  Includes at least three physical symptoms of GAD, such as restlessness,  fatigue, trouble concentrating, irritability, muscle tension, or sleep problems.  Before your health care provider can confirm a diagnosis of GAD, these symptoms must be present more days than they are not, and they must last for six months or longer. How is this treated? The following therapies are usually used to treat GAD:  Medicine. Antidepressant medicine is usually prescribed for long-term daily control.  Antianxiety medicines may be added in severe cases, especially when panic attacks occur.  Talk therapy (psychotherapy). Certain types of talk therapy can be helpful in treating GAD by providing support, education, and guidance. Options include: ? Cognitive behavioral therapy (CBT). People learn coping skills and techniques to ease their anxiety. They learn to identify unrealistic or negative thoughts and behaviors and to replace them with positive ones. ? Acceptance and commitment therapy (ACT). This treatment teaches people how to be mindful as a way to cope with unwanted thoughts and feelings. ? Biofeedback. This process trains you to manage your body's response (physiological response) through breathing techniques and relaxation methods. You will work with a therapist while machines are used to monitor your physical symptoms.  Stress management techniques. These include yoga, meditation, and exercise.  A mental health specialist can help determine which treatment is best for you. Some people see improvement with one type of therapy. However, other people require a combination of therapies. Follow these instructions at home:  Take over-the-counter and prescription medicines only as told by your health care provider.  Try to maintain a normal routine.  Try to anticipate stressful situations and allow extra time to manage them.  Practice any stress management or self-calming techniques as taught by your health care provider.  Do not punish yourself for setbacks or for not making progress.  Try to recognize your accomplishments, even if they are small.  Keep all follow-up visits as told by your health care provider. This is important. Contact a health care provider if:  Your symptoms do not get better.  Your symptoms get worse.  You have signs of depression, such as: ? A persistently sad, cranky, or irritable mood. ? Loss of enjoyment in activities that used to bring you  joy. ? Change in weight or eating. ? Changes in sleeping habits. ? Avoiding friends or family members. ? Loss of energy for normal tasks. ? Feelings of guilt or worthlessness. Get help right away if:  You have serious thoughts about hurting yourself or others. If you ever feel like you may hurt yourself or others, or have thoughts about taking your own life, get help right away. You can go to your nearest emergency department or call:  Your local emergency services (911 in the [place]  A suicide crisis helpline, such as the [facility] at 938-676-5037. This is open 24 hours a day.  Summary  Generalized anxiety disorder (GAD) is a mental health disorder that involves worry that is not triggered by a specific event.  People with GAD often worry excessively about many things in their lives, such as their health and family.  GAD may cause physical symptoms such as restlessness, trouble concentrating, sleep problems, frequent sweating, nausea, diarrhea, headaches, and trembling or muscle twitching.  A mental health specialist can help determine which treatment is best for you. Some people see improvement with one type of therapy. However, other people require a combination of therapies. This information is not intended to replace advice given to you by your health care provider. Make sure you discuss any questions you have  with your health care provider. Document Released: 05/09/2012 Document Revised: 12/03/2015 Document Reviewed: 12/03/2015 Elsevier Interactive Patient Education  2018 [employer].  Sinusitis, Adult Sinusitis is soreness and inflammation of your sinuses. Sinuses are hollow spaces in the bones around your face. Your sinuses are located:  Around your eyes.  In the middle of your forehead.  Behind your nose.  In your cheekbones.  Your sinuses and nasal passages are lined with a stringy fluid (mucus). Mucus normally drains out of your  sinuses. When your nasal tissues become inflamed or swollen, the mucus can become trapped or blocked so air cannot flow through your sinuses. This allows bacteria, viruses, and funguses to grow, which leads to infection. Sinusitis can develop quickly and last for 7?10 days (acute) or for more than 12 weeks (chronic). Sinusitis often develops after a cold. What are the causes? This condition is caused by anything that creates swelling in the sinuses or stops mucus from draining, including:  Allergies.  Asthma.  Bacterial or viral infection.  Abnormally shaped bones between the nasal passages.  Nasal growths that contain mucus (nasal polyps).  Narrow sinus openings.  Pollutants, such as chemicals or irritants in the air.  A foreign object stuck in the nose.  A fungal infection. This is rare.  What increases the risk? The following factors may make you more likely to develop this condition:  Having allergies or asthma.  Having had a recent cold or respiratory tract infection.  Having structural deformities or blockages in your nose or sinuses.  Having a weak immune system.  Doing a lot of swimming or diving.  Overusing nasal sprays.  Smoking.  What are the signs or symptoms? The main symptoms of this condition are pain and a feeling of pressure around the affected sinuses. Other symptoms include:  Upper toothache.  Earache.  Headache.  Bad breath.  Decreased sense of smell and taste.  A cough that may get worse at night.  Fatigue.  Fever.  Thick drainage from your nose. The drainage is often green and it may contain pus (purulent).  Stuffy nose or congestion.  Postnasal drip. This is when extra mucus collects in the throat or back of the nose.  Swelling and warmth over the affected sinuses.  Sore throat.  Sensitivity to light.  How is this diagnosed? This condition is diagnosed based on symptoms, a medical history, and a physical exam. To find out  if your condition is acute or chronic, your health care provider may:  Look in your nose for signs of nasal polyps.  Tap over the affected sinus to check for signs of infection.  View the inside of your sinuses using an imaging device that has a light attached (endoscope).  If your health care provider suspects that you have chronic sinusitis, you may also:  Be tested for allergies.  Have a sample of mucus taken from your nose (nasal culture) and checked for bacteria.  Have a mucus sample examined to see if your sinusitis is related to an allergy.  If your sinusitis does not respond to treatment and it lasts longer than 8 weeks, you may have an MRI or CT scan to check your sinuses. These scans also help to determine how severe your infection is. In rare cases, a bone biopsy may be done to rule out more serious types of fungal sinus disease. How is this treated? Treatment for sinusitis depends on the cause and whether your condition is chronic or acute. If a virus  is causing your sinusitis, your symptoms will go away on their own within 10 days. You may be given medicines to relieve your symptoms, including:  Topical nasal decongestants. They shrink swollen nasal passages and let mucus drain from your sinuses.  Antihistamines. These drugs block inflammation that is triggered by allergies. This can help to ease swelling in your nose and sinuses.  Topical nasal corticosteroids. These are nasal sprays that ease inflammation and swelling in your nose and sinuses.  Nasal saline washes. These rinses can help to get rid of thick mucus in your nose.  If your condition is caused by bacteria, you will be given an antibiotic medicine. If your condition is caused by a fungus, you will be given an antifungal medicine. Surgery may be needed to correct underlying conditions, such as narrow nasal passages. Surgery may also be needed to remove polyps. Follow these instructions at  home: Medicines  Take, use, or apply over-the-counter and prescription medicines only as told by your health care provider. These may include nasal sprays.  If you were prescribed an antibiotic medicine, take it as told by your health care provider. Do not stop taking the antibiotic even if you start to feel better. Hydrate and Humidify  Drink enough water to keep your urine clear or pale yellow. Staying hydrated will help to thin your mucus.  Use a cool mist humidifier to keep the humidity level in your home above 50%.  Inhale steam for 10-15 minutes, 3-4 times a day or as told by your health care provider. You can do this in the bathroom while a hot shower is running.  Limit your exposure to cool or dry air. Rest  Rest as much as possible.  Sleep with your head raised (elevated).  Make sure to get enough sleep each night. General instructions  Apply a warm, moist washcloth to your face 3-4 times a day or as told by your health care provider. This will help with discomfort.  Wash your hands often with soap and water to reduce your exposure to viruses and other germs. If soap and water are not available, use hand sanitizer.  Do not smoke. Avoid being around people who are smoking (secondhand smoke).  Keep all follow-up visits as told by your health care provider. This is important. Contact a health care provider if:  You have a fever.  Your symptoms get worse.  Your symptoms do not improve within 10 days. Get help right away if:  You have a severe headache.  You have persistent vomiting.  You have pain or swelling around your face or eyes.  You have vision problems.  You develop confusion.  Your neck is stiff.  You have trouble breathing. This information is not intended to replace advice given to you by your health care provider. Make sure you discuss any questions you have with your health care provider. Document Released: 01/12/2005 Document Revised:  09/08/2015 Document Reviewed: 11/07/2014 Elsevier Interactive Patient Education  [employer].  If you have lab work done today you will be contacted with your lab results within the next 2 weeks.  If you have not heard from [place] then please contact us. The fastest way to get your results is to register for My Chart.   IF you received an x-ray today, you will receive an invoice from [facility]. Please contact [facility] at [phone] with questions or concerns regarding your invoice.   IF you received labwork today, you will receive an invoice from  [facility]. Please contact [facility] at [phone] with questions or concerns regarding your invoice.   Our billing staff will not be able to assist you with questions regarding bills from these companies.  You will be contacted with the lab results as soon as they are available. The fastest way to get your results is to activate your My Chart account. Instructions are located on the last page of this paperwork. If you have not heard from [place] regarding the results in 2 weeks, please contact this office.

## 2018-01-11 LAB — CMP14+EGFR
A/G RATIO: 1.4 (ref 1.2–2.2)
ALT: 7 IU/L (ref 0–44)
AST: 13 IU/L (ref 0–40)
Albumin: 4.3 g/dL (ref 3.6–4.8)
Alkaline Phosphatase: 102 IU/L (ref 39–117)
BUN/Creatinine Ratio: 13 (ref 10–24)
BUN: 19 mg/dL (ref 8–27)
Bilirubin Total: 0.2 mg/dL (ref 0.0–1.2)
CALCIUM: 9.2 mg/dL (ref 8.6–10.2)
CO2: 23 mmol/L (ref 20–29)
Chloride: 100 mmol/L (ref 96–106)
Creatinine, Ser: 1.45 mg/dL — ABNORMAL HIGH (ref 0.76–1.27)
GFR calc Af Amer: 60 mL/min/{1.73_m2} (ref >59)
GFR, EST NON AFRICAN AMERICAN: 52 mL/min/{1.73_m2} — AB (ref >59)
GLOBULIN, TOTAL: 3 g/dL (ref 1.5–4.5)
Glucose: 73 mg/dL (ref 65–99)
POTASSIUM: 4.5 mmol/L (ref 3.5–5.2)
SODIUM: 140 mmol/L (ref 134–144)
Total Protein: 7.3 g/dL (ref 6.0–8.5)

## 2018-01-11 LAB — LIPID PANEL
CHOLESTEROL TOTAL: 169 mg/dL (ref 100–199)
Chol/HDL Ratio: 3.8 ratio (ref 0.0–5.0)
HDL: 44 mg/dL (ref >39)
LDL CALC: 109 mg/dL — AB (ref 0–99)
TRIGLYCERIDES: 80 mg/dL (ref 0–149)
VLDL CHOLESTEROL CAL: 16 mg/dL (ref 5–40)

## 2018-01-24 ENCOUNTER — Encounter: Payer: Self-pay | Admitting: *Deleted

## 2018-01-27 ENCOUNTER — Other Ambulatory Visit: Payer: Self-pay | Admitting: Family Medicine

## 2018-01-27 DIAGNOSIS — G4700 Insomnia, unspecified: Secondary | ICD-10-CM

## 2018-01-27 DIAGNOSIS — F411 Generalized anxiety disorder: Secondary | ICD-10-CM

## 2018-01-28 NOTE — Telephone Encounter (Signed)
Copied from [place] [id number]. Topic: Quick Communication - Rx Refill/Question >> Jan 28, 2018  3:41 PM Alanda Slim E wrote: Medication: ALPRAZolam [name]) 1 MG tablet   -  Pt has one pill left  Has the patient contacted their pharmacy? No -  Pt stated that Dr.Green advised him to call when he was in need or ready to get medication filled.   Preferred Pharmacy (with phone number or street name): [facility] [place], [place] - [address] [phone] (Phone) [phone] (Fax)    Agent: Please be advised that RX refills may take up to 3 business days. We ask that you follow-up with your pharmacy.

## 2018-01-29 NOTE — Telephone Encounter (Signed)
Plan discussed at last visit.  Refilled Xanax for now.

## 2018-02-11 ENCOUNTER — Ambulatory Visit: Payer: Medicare HMO | Admitting: Family Medicine

## 2018-03-14 ENCOUNTER — Telehealth: Payer: Self-pay | Admitting: Family Medicine

## 2018-03-14 DIAGNOSIS — G4700 Insomnia, unspecified: Secondary | ICD-10-CM

## 2018-03-14 DIAGNOSIS — F411 Generalized anxiety disorder: Secondary | ICD-10-CM

## 2018-03-16 MED ORDER — ALPRAZOLAM 1 MG PO TABS: ORAL_TABLET | ORAL | 0 refills | Status: DC

## 2018-03-16 NOTE — Addendum Note (Signed)
Addended by: [name] R on: 03/16/2018 04:18 PM   Modules accepted: Orders

## 2018-03-16 NOTE — Telephone Encounter (Signed)
Pt was advised by pharmacy to call office to be advised. Pt would like to check the status of his refill request

## 2018-03-16 NOTE — Telephone Encounter (Signed)
Anxiety was discussed January 10, 2018.  Was taking 2 Xanax per day at that time 1 in the morning 1 at night.  Did report a new place to live at last visit and agreed to try SSRI. He was started on Zoloft 25 mg daily, advised to follow-up in 4 weeks so we can determine how that was working and dosage changes.  Was continued on Xanax twice per day at that time.  He did not show for appointment on January 17.  Controlled substance database (PDMP) reviewed.  Alprazolam 1 mg tablet #90 was filled on January 4.  If taking twice per day that should last 45 days, so appropriate timing of refill.  New prescription sent for alprazolam 1 mg #60, please schedule office visit prior to that running out to discuss Zoloft

## 2018-03-16 NOTE — Telephone Encounter (Signed)
Patient last refill of xanax was refused. I called patient to let him know that the refill was denied because he was give 90 tabs on 1/420 so he should still have tabs left. Looking back in your office notes it stated patient supposed to be taking 1/2-1 tab twice a day and patient stated he have discussed with you that he takes 2 tabs a day. He was taking 3 tabs a day but he has decreased to 2 tabs a day. Patient stated the zoloft is not helping with the depression. Patient is stating he need a refill on the xanax

## 2018-04-13 ENCOUNTER — Other Ambulatory Visit: Payer: Self-pay | Admitting: Family Medicine

## 2018-04-13 ENCOUNTER — Telehealth: Payer: Self-pay | Admitting: Family Medicine

## 2018-04-13 DIAGNOSIS — F411 Generalized anxiety disorder: Secondary | ICD-10-CM

## 2018-04-13 DIAGNOSIS — G4700 Insomnia, unspecified: Secondary | ICD-10-CM

## 2018-04-13 NOTE — Telephone Encounter (Signed)
Requested medication (s) are due for refill today: Yes  Requested medication (s) are on the active medication list: Yes  Last refill:  No  Future visit scheduled: 03/16/18  Notes to clinic:  See request    Requested Prescriptions  Pending Prescriptions Disp Refills   ALPRAZolam (XANAX) 1 MG tablet [Pharmacy Med Name: ALPRAZolam 1 MG Oral Tablet] 60 tablet 0    Sig: TAKE 1/2 TO 1 (ONE-HALF TO ONE) TABLET BY MOUTH TWICE DAILY AS NEEDED FOR ANXIETY     Not Delegated - Psychiatry:  Anxiolytics/Hypnotics Failed - 04/13/2018 11:00 AM      Failed - This refill cannot be delegated      Failed - Urine Drug Screen completed in last 360 days.      Passed - Valid encounter within last 6 months    Recent Outpatient Visits          3 months ago Essential hypertension   [facility], Ranell Patrick, MD   9 months ago Elevated alkaline phosphatase level   [facility], Ranell Patrick, MD   1 year ago Elevated alkaline phosphatase level   [facility], Ranell Patrick, MD   1 year ago Elevated alkaline phosphatase level   [facility], Ranell Patrick, MD   1 year ago Hyperlipidemia, unspecified hyperlipidemia type   [facility], Ranell Patrick, MD

## 2018-04-13 NOTE — Telephone Encounter (Signed)
Please see note below. 

## 2018-04-13 NOTE — Telephone Encounter (Signed)
Patient is requesting a refill of the following medications: Requested Prescriptions   Pending Prescriptions Disp Refills  . ALPRAZolam (XANAX) 1 MG tablet [Pharmacy Med Name: ALPRAZolam 1 MG Oral Tablet] 60 tablet 0    Sig: TAKE 1/2 TO 1 (ONE-HALF TO ONE) TABLET BY MOUTH TWICE DAILY AS NEEDED FOR ANXIETY    Date of patient request:04/13/2018 Last office visit: 01/10/2018  Date of last refill:03/16/2018 Last refill amount: #60 Follow up time period per chart: n/a  Please advise. Dgaddy, CMA

## 2018-04-13 NOTE — Telephone Encounter (Signed)
Controlled substance database (PDMP) reviewed. No concerns appreciated. Last filled 03/16/2022 #60.  He did not show for appointment on January 17.   Refill request February 17, advised to schedule appointment.    Given current issues with coronavirus, I will agree to refill alprazolam at this time but then will need to plan on appointment within next 2 months, depending on situation at that time.  Please schedule appointment within 2 months.

## 2018-04-13 NOTE — Telephone Encounter (Signed)
Copied from [place] [id number]. Topic: Quick Communication - Rx Refill/Question >> Apr 13, 2018 11:06 AM Rayann Heman wrote: Medication: ALPRAZolam [name]) 1 MG tablet [[id number]]   Has the patient contacted their pharmacy? No Preferred Pharmacy (with phone number or street name): [place], [place] - [address] [phone] (Phone) [phone] (Fax)  Agent: Please be advised that RX refills may take up to 3 business days. We ask that you follow-up with your pharmacy.

## 2018-05-05 ENCOUNTER — Other Ambulatory Visit: Payer: Self-pay | Admitting: Family Medicine

## 2018-05-05 DIAGNOSIS — G4700 Insomnia, unspecified: Secondary | ICD-10-CM

## 2018-05-05 DIAGNOSIS — F411 Generalized anxiety disorder: Secondary | ICD-10-CM

## 2018-05-05 NOTE — Telephone Encounter (Signed)
Requested medication (s) are due for refill today: Early  Requested medication (s) are on the active medication list: Yes  Last refill:  04/13/18  Future visit scheduled: No  Notes to clinic:  See request    Requested Prescriptions  Pending Prescriptions Disp Refills   ALPRAZolam (XANAX) 1 MG tablet [Pharmacy Med Name: ALPRAZolam 1 MG Oral Tablet] 60 tablet 0    Sig: TAKE 1/2 TO 1 (ONE-HALF TO ONE) TABLET BY MOUTH TWICE DAILY AS NEEDED FOR ANXIETY     Not Delegated - Psychiatry:  Anxiolytics/Hypnotics Failed - 05/05/2018  2:53 PM      Failed - This refill cannot be delegated      Failed - Urine Drug Screen completed in last 360 days.      Passed - Valid encounter within last 6 months    Recent Outpatient Visits          3 months ago Essential hypertension   [facility], Ranell Patrick, MD   10 months ago Elevated alkaline phosphatase level   [facility], Ranell Patrick, MD   1 year ago Elevated alkaline phosphatase level   [facility], Ranell Patrick, MD   1 year ago Elevated alkaline phosphatase level   [facility], Ranell Patrick, MD   1 year ago Hyperlipidemia, unspecified hyperlipidemia type   [facility], Ranell Patrick, MD

## 2018-05-07 NOTE — Telephone Encounter (Signed)
Controlled substance database (PDMP) reviewed.last filled 04/13/18.   Refill sent, but please call and clarify with patient - should still have some left from last Rx.

## 2018-06-08 ENCOUNTER — Other Ambulatory Visit: Payer: Self-pay | Admitting: Family Medicine

## 2018-06-08 DIAGNOSIS — G4700 Insomnia, unspecified: Secondary | ICD-10-CM

## 2018-06-08 DIAGNOSIS — F411 Generalized anxiety disorder: Secondary | ICD-10-CM

## 2018-06-08 NOTE — Telephone Encounter (Signed)
Requested medication (s) are due for refill today -yes  Requested medication (s) are on the active medication list -yes  Future visit scheduled -no  Last refill: 05/07/18  Notes to clinic: Patient is requesting non delegated Rx- sent for PCP review   Requested Prescriptions  Pending Prescriptions Disp Refills   ALPRAZolam (XANAX) 1 MG tablet [Pharmacy Med Name: ALPRAZolam 1 MG Oral Tablet] 60 tablet 0    Sig: TAKE 1/2 TO 1 (ONE-HALF TO ONE) TABLET BY MOUTH TWICE DAILY AS NEEDED FOR ANXIETY     Not Delegated - Psychiatry:  Anxiolytics/Hypnotics Failed - 06/08/2018  2:21 PM      Failed - This refill cannot be delegated      Failed - Urine Drug Screen completed in last 360 days.      Passed - Valid encounter within last 6 months    Recent Outpatient Visits          4 months ago Essential hypertension   [facility], Ranell [facility], MD   11 months ago Elevated alkaline phosphatase level   [facility], Ranell [facility], MD   1 year ago Elevated alkaline phosphatase level   [facility], Ranell [facility], MD   1 year ago Elevated alkaline phosphatase level   [facility], Ranell Patrick, MD   1 year ago Hyperlipidemia, unspecified hyperlipidemia type   [facility], Ranell [facility], MD              Requested Prescriptions  Pending Prescriptions Disp Refills   ALPRAZolam (XANAX) 1 MG tablet [Pharmacy Med Name: ALPRAZolam 1 MG Oral Tablet] 60 tablet 0    Sig: TAKE 1/2 TO 1 (ONE-HALF TO ONE) TABLET BY MOUTH TWICE DAILY AS NEEDED FOR ANXIETY     Not Delegated - Psychiatry:  Anxiolytics/Hypnotics Failed - 06/08/2018  2:21 PM      Failed - This refill cannot be delegated      Failed - Urine Drug Screen completed in last 360 days.      Passed - Valid encounter within last 6 months    Recent Outpatient Visits          4 months ago Essential hypertension   [facility], Ranell Patrick, MD   11 months ago  Elevated alkaline phosphatase level   [facility], Ranell Patrick, MD   1 year ago Elevated alkaline phosphatase level   [facility], Ranell Patrick, MD   1 year ago Elevated alkaline phosphatase level   [facility], Ranell Patrick, MD   1 year ago Hyperlipidemia, unspecified hyperlipidemia type   [facility], Ranell Patrick, MD

## 2018-06-09 NOTE — Telephone Encounter (Signed)
Patient calling to check status of refill request. States that he is currently out of this medication. Advised of refill policy and awaiting approval.

## 2018-06-10 MED ORDER — ALPRAZOLAM 1 MG PO TABS: ORAL_TABLET | ORAL | 0 refills | Status: DC

## 2018-06-10 NOTE — Telephone Encounter (Signed)
Pt called requesting an update on this medication. Pt states he is out of this medication. Please advise.

## 2018-06-10 NOTE — Telephone Encounter (Addendum)
°  Relation to pt: self  Call back number: [phone] Pharmacy:  [place], [place] - [address] [phone] (Phone) [phone] (Fax)    Reason for call:  Patient checking on the status of message below stating he is completely out, please advise

## 2018-06-10 NOTE — Telephone Encounter (Signed)
Pt is requesting refill on Xanax. Looks like he has not been seen since 01/10/18.  Per last note:  Okay to continue Xanax up to twice per day for now.  Suppose to come back to recheck 6 wks.   Please advise.

## 2018-06-10 NOTE — Telephone Encounter (Addendum)
See most recent refill requests - plan in March was follow up in next month or two depending on restrictions in place. Please clarify if he took Zoloft and response when prescribed in December.   I will refill alprazolam again for now, but will need to follow up in next month by telemed if needed.   Controlled substance database (PDMP) reviewed. No concerns appreciated, last filled 05/10/18

## 2018-06-10 NOTE — Addendum Note (Signed)
Addended by: [name] R on: 06/10/2018 05:54 PM   Modules accepted: Orders

## 2018-06-13 ENCOUNTER — Encounter: Payer: Self-pay | Admitting: Emergency Medicine

## 2018-06-13 ENCOUNTER — Ambulatory Visit (INDEPENDENT_AMBULATORY_CARE_PROVIDER_SITE_OTHER): Payer: Medicare HMO | Admitting: Emergency Medicine

## 2018-06-13 ENCOUNTER — Other Ambulatory Visit: Payer: Self-pay

## 2018-06-13 VITALS — BP 127/58 | HR 71 | Temp 98.0°F | Resp 18 | Ht 69.0 in | Wt 150.0 lb

## 2018-06-13 DIAGNOSIS — M67432 Ganglion, left wrist: Secondary | ICD-10-CM

## 2018-06-13 IMAGING — CR DG LUMBAR SPINE 1V
1 series · 1 of 1 positions shown · non-contrast
Comparison: MR 11/13/2015 and previous

CLINICAL DATA: microdisc bilateral lumbar 4-5

EXAM:
LUMBAR SPINE - 1 VIEW

[lat]
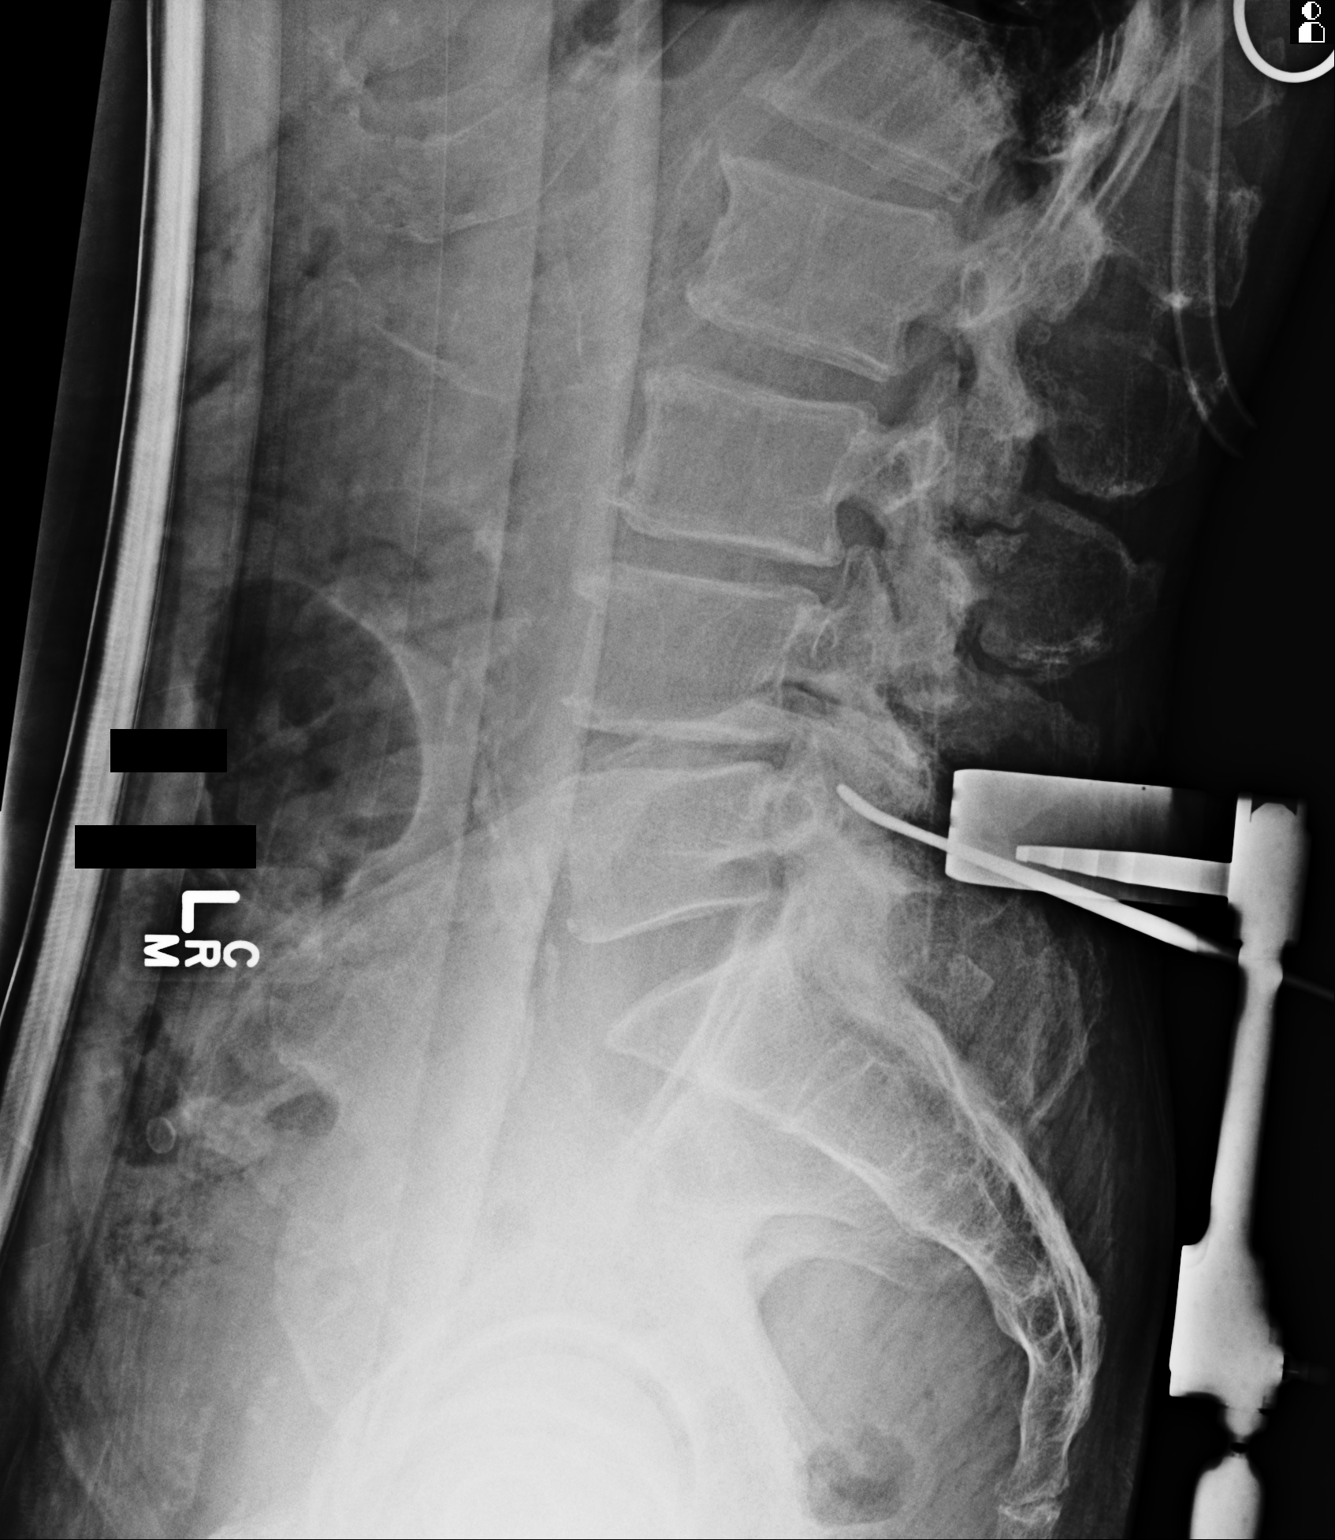

[1 of 1 positions shown; findings below may reference images not displayed]

FINDINGS: Single lateral intraoperative lumbar radiograph shows posterior
surgical instruments, including a probe projecting over the
posterior elements at the level of the L4-5 interspace.
IMPRESSION: 1. Intraoperative localization.

## 2018-06-13 NOTE — Progress Notes (Signed)
Roy Moore 63 y.o.   Chief Complaint  Patient presents with  . Wrist Pain    x 1 week LEFT-per patient it looks like a bite and pain to close hand     HISTORY OF PRESENT ILLNESS: This is a 64 y.o. male complaining of painful lump on left wrist that got worse 1 week ago.  Denies injury.  No other significant symptoms.  HPI   Prior to Admission medications   Medication Sig Start Date End Date Taking? Authorizing Provider  ALPRAZolam (XANAX) 1 MG tablet TAKE 1/2 TO 1 (ONE-HALF TO ONE) TABLET BY MOUTH TWICE DAILY AS NEEDED FOR ANXIETY 06/10/18  Yes [name], MD  amitriptyline (ELAVIL) 50 MG tablet Take 1 tablet (50 mg total) by mouth at bedtime. 01/10/18  Yes [name], MD  amLODipine (NORVASC) 5 MG tablet Take 1 tablet (5 mg total) by mouth daily. 01/10/18  Yes [name], MD  clopidogrel (PLAVIX) 75 MG tablet Take 1 tablet (75 mg total) by mouth daily. 01/10/18  Yes [name], MD  lovastatin (MEVACOR) 20 MG tablet Take 1 tablet (20 mg total) by mouth at bedtime. 01/10/18  Yes [name], MD  oxyCODONE-acetaminophen (PERCOCET/ROXICET) 5-325 MG tablet Take 1 tablet by mouth every 6 (six) hours as needed for moderate pain. Patient not taking: Reported on 06/13/2018 07/26/16   Gabriel Earing, PA-C  sertraline (ZOLOFT) 25 MG tablet Take 1 tablet (25 mg total) by mouth daily. Patient not taking: Reported on 06/13/2018 01/10/18   [name], MD    Allergies  Allergen Reactions  . No Known Allergies     Patient Active Problem List   Diagnosis Date Noted  . PAD (peripheral artery disease) ([place]) 07/24/2016  . Peripheral arterial disease ([place]) 07/17/2016  . Lumbar stenosis with neurogenic claudication 12/03/2015  . CVA, old, hemiparesis ([place]) 03/24/2011  . ANXIETY DISORDER 10/04/2007  . TOBACCO ABUSE 09/30/2007  . GERD 09/30/2007  . DYSLIPIDEMIA 09/15/2007  . STROKE WITH LEFT HEMIPARESIS 09/15/2007    Past Medical History:   Diagnosis Date  . Anxiety   . Complication of anesthesia   . Hyperlipidemia   . Peripheral vascular disease ([place])   . PONV (postoperative nausea and vomiting)   . Stroke [facility]) 2009   no residual    Past Surgical History:  Procedure Laterality Date  . ABDOMINAL AORTOGRAM W/LOWER EXTREMITY [name] 06/09/2016   Procedure: Abdominal Aortogram w/Lower Extremity;  Surgeon: Serafina Mitchell, MD;  Location: [facility];  Service: Cardiovascular;  Laterality: [name];  . BACK SURGERY     10 yrs ago or so-fusion in neck   . CERVICAL FUSION    . COLONOSCOPY     15-20 yrs ago-normal per pt.   . COLONOSCOPY W/ POLYPECTOMY    . CYSTOSCOPY     kidney stools removed   . FEMORAL-POPLITEAL BYPASS GRAFT Right 07/24/2016   Procedure: RIGHT FEMORAL TO ABOVE KNEE POPLITEAL ARTERY BYPASS GRAFT;  Surgeon: Serafina Mitchell, MD;  Location: [facility];  Service: Vascular;  Laterality: Right;  . LUMBAR LAMINECTOMY/DECOMPRESSION MICRODISCECTOMY Bilateral 12/03/2015   Procedure: Microdiscectomy - bilateral - Lumbar four-lumbar five;  Surgeon: Earnie Larsson, MD;  Location: [facility];  Service: Neurosurgery;  Laterality: Bilateral;  . PERIPHERAL VASCULAR INTERVENTION Right 06/09/2016   Procedure: Peripheral Vascular Intervention;  Surgeon: Serafina Mitchell, MD;  Location: [facility];  Service: Cardiovascular;  Laterality: Right;  SFA  . teeth removal  Social History   Socioeconomic History  . Marital status: Married    Spouse name: Not on file  . Number of children: Not on file  . Years of education: Not on file  . Highest education level: Not on file  Occupational History  . Not on file  Social Needs  . Financial resource strain: Not on file  . Food insecurity:    Worry: Not on file    Inability: Not on file  . Transportation needs:    Medical: Not on file    Non-medical: Not on file  Tobacco Use  . Smoking status: Current Every Day Smoker    Packs/day: 0.50    Years: 47.00    Pack years: 23.50     Types: Cigarettes  . Smokeless tobacco: Never Used  Substance and Sexual Activity  . Alcohol use: No    Alcohol/week: 0.0 standard drinks    Comment: no alcohol in 23 years (12/02/15  . Drug use: No  . Sexual activity: Not on file  Lifestyle  . Physical activity:    Days per week: Not on file    Minutes per session: Not on file  . Stress: Not on file  Relationships  . Social connections:    Talks on phone: Not on file    Gets together: Not on file    Attends religious service: Not on file    Active member of club or organization: Not on file    Attends meetings of clubs or organizations: Not on file    Relationship status: Not on file  . Intimate partner violence:    Fear of current or ex partner: Not on file    Emotionally abused: Not on file    Physically abused: Not on file    Forced sexual activity: Not on file  Other Topics Concern  . Not on file  Social History Narrative  . Not on file    Family History  Problem Relation Age of Onset  . Colon cancer Neg Hx   . Rectal cancer Neg Hx   . Stomach cancer Neg Hx   . Esophageal cancer Neg Hx      Review of Systems  Constitutional: Negative.  Negative for chills and fever.  HENT: Negative for sore throat.   Respiratory: Negative.  Negative for cough and shortness of breath.   Cardiovascular: Negative.  Negative for chest pain.  Gastrointestinal: Negative.  Negative for abdominal pain, diarrhea, nausea and vomiting.  Genitourinary: Negative for hematuria.  Musculoskeletal: Negative for myalgias.  Skin: Negative.  Negative for rash.  Neurological: Negative for dizziness and headaches.  All other systems reviewed and are negative.  Vitals:   06/13/18 1456  BP: (!) 127/58  Pulse: 71  Resp: 18  Temp: 98 F (36.7 C)  SpO2: 97%     Physical Exam Constitutional:      Appearance: Normal appearance.  HENT:     Head: Normocephalic and atraumatic.  Eyes:     Extraocular Movements: Extraocular movements intact.      Pupils: Pupils are equal, round, and reactive to light.  Neck:     Musculoskeletal: Normal range of motion.  Cardiovascular:     Rate and Rhythm: Normal rate and regular rhythm.     Heart sounds: Normal heart sounds.  Pulmonary:     Effort: Pulmonary effort is normal.     Breath sounds: Normal breath sounds.  Musculoskeletal:     Comments: Left wrist: Positive ganglionic cyst (see picture below)  without erythema or fluctuation.  Nontender.  Limited range of motion due to pain.  No bruising.  Skin:    General: Skin is warm and dry.     Capillary Refill: Capillary refill takes less than 2 seconds.  Neurological:     General: No focal deficit present.     Mental Status: He is alert and oriented to person, place, and time.  Psychiatric:        Mood and Affect: Mood normal.        Behavior: Behavior normal.          ASSESSMENT & PLAN: Roy Moore was seen today for wrist pain.  Diagnoses and all orders for this visit:  Ganglion cyst of dorsum of left wrist    Patient Instructions       If you have lab work done today you will be contacted with your lab results within the next 2 weeks.  If you have not heard from [place] then please contact us. The fastest way to get your results is to register for My Chart.   IF you received an x-ray today, you will receive an invoice from [facility]. Please contact [facility] at [phone] with questions or concerns regarding your invoice.   IF you received labwork today, you will receive an invoice from [facility]. Please contact [facility] at [phone] with questions or concerns regarding your invoice.   Our billing staff will not be able to assist you with questions regarding bills from these companies.  You will be contacted with the lab results as soon as they are available. The fastest way to get your results is to activate your My Chart account. Instructions are located on the last page of this paperwork. If you  have not heard from [place] regarding the results in 2 weeks, please contact this office.     Ganglion Cyst  A ganglion cyst is a non-cancerous, fluid-filled lump that occurs near a joint or tendon. The cyst grows out of a joint or the lining of a tendon. Ganglion cysts most often develop in the hand or wrist, but they can also develop in the shoulder, elbow, hip, knee, ankle, or foot. Ganglion cysts are ball-shaped or egg-shaped. Their size can range from the size of a pea to larger than a grape. Increased activity may cause the cyst to get bigger because more fluid starts to build up. What are the causes? The exact cause of this condition is not known, but it may be related to:  Inflammation or irritation around the joint.  An injury.  Repetitive movements or overuse.  Arthritis. What increases the risk? You are more likely to develop this condition if:  You are a woman.  You are 47-79 years old. What are the signs or symptoms? The main symptom of this condition is a lump. It most often appears on the hand or wrist. In many cases, there are no other symptoms, but a cyst can sometimes cause:  Tingling.  Pain.  Numbness.  Muscle weakness.  Weak grip.  Less range of motion in a joint. How is this diagnosed? Ganglion cysts are usually diagnosed based on a physical exam. Your health care provider will feel the lump and may shine a light next to it. If it is a ganglion cyst, the light will likely shine through it. Your health care provider may order an X-ray, ultrasound, or MRI to rule out other conditions. How is this treated? Ganglion cysts often go away on their own without  treatment. If you have pain or other symptoms, treatment may be needed. Treatment is also needed if the ganglion cyst limits your movement or if it gets infected. Treatment may include:  Wearing a brace or splint on your wrist or finger.  Taking anti-inflammatory medicine.  Having fluid drained from the  lump with a needle (aspiration).  Getting a steroid injected into the joint.  Having surgery to remove the ganglion cyst.  Placing a pad on your shoe or wearing shoes that will not rub against the cyst if it is on your foot. Follow these instructions at home:  Do not press on the ganglion cyst, poke it with a needle, or hit it.  Take over-the-counter and prescription medicines only as told by your health care provider.  If you have a brace or splint: ? Wear it as told by your health care provider. ? Remove it as told by your health care provider. Ask if you need to remove it when you take a shower or a bath.  Watch your ganglion cyst for any changes.  Keep all follow-up visits as told by your health care provider. This is important. Contact a health care provider if:  Your ganglion cyst becomes larger or more painful.  You have pus coming from the lump.  You have weakness or numbness in the affected area.  You have a fever or chills. Get help right away if:  You have a fever and have any of these in the cyst area: ? Increased redness. ? Red streaks. ? Swelling. Summary  A ganglion cyst is a non-cancerous, fluid-filled lump that occurs near a joint or tendon.  Ganglion cysts most often develop in the hand or wrist, but they can also develop in the shoulder, elbow, hip, knee, ankle, or foot.  Ganglion cysts often go away on their own without treatment. This information is not intended to replace advice given to you by your health care provider. Make sure you discuss any questions you have with your health care provider. Document Released: 01/10/2000 Document Revised: 09/11/2016 Document Reviewed: 09/11/2016 Elsevier Interactive Patient Education  2019 [employer]vier Inc.      Agustina Caroli, MD [facility]

## 2018-06-13 NOTE — Patient Instructions (Addendum)
If you have lab work done today you will be contacted with your lab results within the next 2 weeks.  If you have not heard from [place] then please contact us. The fastest way to get your results is to register for My Chart.   IF you received an x-ray today, you will receive an invoice from [facility]. Please contact [facility] at [phone] with questions or concerns regarding your invoice.   IF you received labwork today, you will receive an invoice from [facility]. Please contact [facility] at [phone] with questions or concerns regarding your invoice.   Our billing staff will not be able to assist you with questions regarding bills from these companies.  You will be contacted with the lab results as soon as they are available. The fastest way to get your results is to activate your My Chart account. Instructions are located on the last page of this paperwork. If you have not heard from [place] regarding the results in 2 weeks, please contact this office.     Ganglion Cyst  A ganglion cyst is a non-cancerous, fluid-filled lump that occurs near a joint or tendon. The cyst grows out of a joint or the lining of a tendon. Ganglion cysts most often develop in the hand or wrist, but they can also develop in the shoulder, elbow, hip, knee, ankle, or foot. Ganglion cysts are ball-shaped or egg-shaped. Their size can range from the size of a pea to larger than a grape. Increased activity may cause the cyst to get bigger because more fluid starts to build up. What are the causes? The exact cause of this condition is not known, but it may be related to:  Inflammation or irritation around the joint.  An injury.  Repetitive movements or overuse.  Arthritis. What increases the risk? You are more likely to develop this condition if:  You are a woman.  You are 36-61 years old. What are the signs or symptoms? The main symptom of this condition is a lump. It most often appears  on the hand or wrist. In many cases, there are no other symptoms, but a cyst can sometimes cause:  Tingling.  Pain.  Numbness.  Muscle weakness.  Weak grip.  Less range of motion in a joint. How is this diagnosed? Ganglion cysts are usually diagnosed based on a physical exam. Your health care provider will feel the lump and may shine a light next to it. If it is a ganglion cyst, the light will likely shine through it. Your health care provider may order an X-ray, ultrasound, or MRI to rule out other conditions. How is this treated? Ganglion cysts often go away on their own without treatment. If you have pain or other symptoms, treatment may be needed. Treatment is also needed if the ganglion cyst limits your movement or if it gets infected. Treatment may include:  Wearing a brace or splint on your wrist or finger.  Taking anti-inflammatory medicine.  Having fluid drained from the lump with a needle (aspiration).  Getting a steroid injected into the joint.  Having surgery to remove the ganglion cyst.  Placing a pad on your shoe or wearing shoes that will not rub against the cyst if it is on your foot. Follow these instructions at home:  Do not press on the ganglion cyst, poke it with a needle, or hit it.  Take over-the-counter and prescription medicines only as told by your health care provider.  If you have  a brace or splint: ? Wear it as told by your health care provider. ? Remove it as told by your health care provider. Ask if you need to remove it when you take a shower or a bath.  Watch your ganglion cyst for any changes.  Keep all follow-up visits as told by your health care provider. This is important. Contact a health care provider if:  Your ganglion cyst becomes larger or more painful.  You have pus coming from the lump.  You have weakness or numbness in the affected area.  You have a fever or chills. Get help right away if:  You have a fever and have any  of these in the cyst area: ? Increased redness. ? Red streaks. ? Swelling. Summary  A ganglion cyst is a non-cancerous, fluid-filled lump that occurs near a joint or tendon.  Ganglion cysts most often develop in the hand or wrist, but they can also develop in the shoulder, elbow, hip, knee, ankle, or foot.  Ganglion cysts often go away on their own without treatment. This information is not intended to replace advice given to you by your health care provider. Make sure you discuss any questions you have with your health care provider. Document Released: 01/10/2000 Document Revised: 09/11/2016 Document Reviewed: 09/11/2016 Elsevier Interactive Patient Education  2019 [employer].

## 2018-06-13 NOTE — Telephone Encounter (Signed)
Informed pt that his Rx has been filled. He informed me that he is still taking the Zoloft but states the medication is not helping. He does has an appointment for today 06/13/2018 with Dr. Mitchel Honour.

## 2018-07-08 ENCOUNTER — Telehealth: Payer: Self-pay | Admitting: Family Medicine

## 2018-07-08 DIAGNOSIS — F411 Generalized anxiety disorder: Secondary | ICD-10-CM

## 2018-07-08 DIAGNOSIS — G4700 Insomnia, unspecified: Secondary | ICD-10-CM

## 2018-07-08 MED ORDER — ALPRAZOLAM 1 MG PO TABS: ORAL_TABLET | ORAL | 0 refills | Status: DC

## 2018-07-08 NOTE — Telephone Encounter (Signed)
Controlled substance database (PDMP) reviewed. No concerns appreciated. Last Rx alprazolam filled 5/16.  Rx ordered to be filled 6/15. Keep appt as planned to discuss future refills.

## 2018-07-08 NOTE — Addendum Note (Signed)
Addended by: [name] R on: 07/08/2018 06:35 PM   Modules accepted: Orders

## 2018-07-08 NOTE — Telephone Encounter (Signed)
Patient said his script runs out the day before his appnt  He would like someone to call and let him know if that is ok

## 2018-07-13 ENCOUNTER — Encounter: Payer: Self-pay | Admitting: Family Medicine

## 2018-07-13 ENCOUNTER — Ambulatory Visit (INDEPENDENT_AMBULATORY_CARE_PROVIDER_SITE_OTHER): Payer: Medicare HMO | Admitting: Family Medicine

## 2018-07-13 ENCOUNTER — Other Ambulatory Visit: Payer: Self-pay

## 2018-07-13 VITALS — BP 120/68 | HR 75 | Temp 98.4°F | Resp 14 | Wt 140.6 lb

## 2018-07-13 DIAGNOSIS — R7989 Other specified abnormal findings of blood chemistry: Secondary | ICD-10-CM | POA: Diagnosis not present

## 2018-07-13 DIAGNOSIS — R69 Illness, unspecified: Secondary | ICD-10-CM | POA: Diagnosis not present

## 2018-07-13 DIAGNOSIS — M67432 Ganglion, left wrist: Secondary | ICD-10-CM

## 2018-07-13 DIAGNOSIS — F411 Generalized anxiety disorder: Secondary | ICD-10-CM

## 2018-07-13 DIAGNOSIS — E785 Hyperlipidemia, unspecified: Secondary | ICD-10-CM | POA: Diagnosis not present

## 2018-07-13 DIAGNOSIS — I739 Peripheral vascular disease, unspecified: Secondary | ICD-10-CM | POA: Diagnosis not present

## 2018-07-13 DIAGNOSIS — I10 Essential (primary) hypertension: Secondary | ICD-10-CM

## 2018-07-13 MED ORDER — LOVASTATIN 20 MG PO TABS: 20.0000 mg | ORAL_TABLET | Freq: Every day | ORAL | 1 refills | Status: DC

## 2018-07-13 MED ORDER — SERTRALINE HCL 25 MG PO TABS: 25.0000 mg | ORAL_TABLET | Freq: Every day | ORAL | 1 refills | Status: DC

## 2018-07-13 MED ORDER — CLOPIDOGREL BISULFATE 75 MG PO TABS: 75.0000 mg | ORAL_TABLET | Freq: Every day | ORAL | 11 refills | Status: DC

## 2018-07-13 MED ORDER — AMLODIPINE BESYLATE 5 MG PO TABS: 5.0000 mg | ORAL_TABLET | Freq: Every day | ORAL | 1 refills | Status: DC

## 2018-07-13 NOTE — Progress Notes (Signed)
Subjective:    Patient ID: Roy Moore, male    DOB: 07/18/1955, 63 y.o.   MRN: [id number]  HPI Roy Moore is a 63 y.o. male Presents today for: Chief Complaint  Patient presents with  . Hypertension    6 month f/u on diabetes, Patient also need a refill on his medication that has been pend  . Cyst    cyst on left wrist that will not go away. Been there for 1 month   Increased demands at home - wife is bed ridden. He does cleaning/cooking.  Son will be going to [place] in November Chief Executive Officer).    L wrist cyst: Seen 1 month ago.  Diagnosed with ganglion cyst. Enlarging. Impacts making fist -seems to be increased in size.   Hypertension: BP Readings from Last 3 Encounters:  07/13/18 120/68  06/13/18 (!) 127/58  01/10/18 (!) 154/60   Lab Results  Component Value Date   CREATININE 1.45 (H) 01/10/2018  hx of CVA 8-9 years ago, PAD, R fempop graft 06/2016.  On plavix for hx of cva and PAD. No new bleeding.  Creat increased from 1.21in 04/05/17 to 1.45 in 12/2017. No recheck since 12/2017.  Amlodipine 5mg  once per day.  Hyperlipidemia:  Lab Results  Component Value Date   CHOL 169 01/10/2018   HDL 44 01/10/2018   LDLCALC 109 (H) 01/10/2018   TRIG 80 01/10/2018   CHOLHDL 3.8 01/10/2018   Lab Results  Component Value Date   ALT 7 01/10/2018   AST 13 01/10/2018   GGT 175 (H) 10/08/2016   ALKPHOS 102 01/10/2018   BILITOT 0.2 01/10/2018  no new myalgias with lovastatin 20mg  qd. Labs today,.    Hx of lumber stenosis with neurogenic claudication. Lumbar laminectomy Takes ibuprofen once per day for back and shoulder pain. No pain in legs.   Tobacco abuse: Has cut back to less than 1/2 pack per day.   Insomnia:  Controlled with amitriptyline 50mg .   Anxiety: See prior notes.  Stressors at home.  Has been on chronic xanax for some time. Prior 3 per day, down to 2 per day (1mg  xanax).  No IDU, no alcohol. No narcotic meds.  Tried zoloft 25 mg in 12/2017  - no side effects, but did not seem to help anxiety. Ran out. Only took for 1 month.   Controlled substance database (PDMP) reviewed. No concerns appreciated.  Xanax 1mg  #60 Filled 4/14, 5/16, 6/15.   No flowsheet data found.  Depression screen [facility] 2/9 07/13/2018 06/13/2018 01/10/2018 07/09/2017 04/05/2017  Decreased Interest 0 0 3 3 0  Down, Depressed, Hopeless 0 0 1 3 0  PHQ - 2 Score 0 0 4 6 0  Altered sleeping - - 2 2 -  Tired, decreased energy - - 3 3 -  Change in appetite - - 0 3 -  Feeling bad or failure about yourself  - - 1 3 -  Trouble concentrating - - 0 0 -  Moving slowly or fidgety/restless - - 3 3 -  Suicidal thoughts - - 0 0 -  PHQ-9 Score - - 13 20 -  Difficult doing work/chores - - Not difficult at all Somewhat difficult -      Patient Active Problem List   Diagnosis Date Noted  . Ganglion cyst of dorsum of left wrist 06/13/2018  . PAD (peripheral artery disease) ([place]) 07/24/2016  . Peripheral arterial disease ([place]) 07/17/2016  . Lumbar stenosis with neurogenic claudication 12/03/2015  . CVA,  old, hemiparesis ([place]) 03/24/2011  . ANXIETY DISORDER 10/04/2007  . TOBACCO ABUSE 09/30/2007  . GERD 09/30/2007  . DYSLIPIDEMIA 09/15/2007  . STROKE WITH LEFT HEMIPARESIS 09/15/2007   Past Medical History:  Diagnosis Date  . Anxiety   . Complication of anesthesia   . Hyperlipidemia   . Peripheral vascular disease ([place])   . PONV (postoperative nausea and vomiting)   . Stroke [facility]) 2009   no residual   Past Surgical History:  Procedure Laterality Date  . ABDOMINAL AORTOGRAM W/LOWER EXTREMITY [name] 06/09/2016   Procedure: Abdominal Aortogram w/Lower Extremity;  Surgeon: Serafina Mitchell, MD;  Location: [facility];  Service: Cardiovascular;  Laterality: [name];  . BACK SURGERY     10 yrs ago or so-fusion in neck   . CERVICAL FUSION    . COLONOSCOPY     15-20 yrs ago-normal per pt.   . COLONOSCOPY W/ POLYPECTOMY    . CYSTOSCOPY     kidney stools removed   .  FEMORAL-POPLITEAL BYPASS GRAFT Right 07/24/2016   Procedure: RIGHT FEMORAL TO ABOVE KNEE POPLITEAL ARTERY BYPASS GRAFT;  Surgeon: Serafina Mitchell, MD;  Location: [facility];  Service: Vascular;  Laterality: Right;  . LUMBAR LAMINECTOMY/DECOMPRESSION MICRODISCECTOMY Bilateral 12/03/2015   Procedure: Microdiscectomy - bilateral - Lumbar four-lumbar five;  Surgeon: Earnie Larsson, MD;  Location: [facility];  Service: Neurosurgery;  Laterality: Bilateral;  . PERIPHERAL VASCULAR INTERVENTION Right 06/09/2016   Procedure: Peripheral Vascular Intervention;  Surgeon: Serafina Mitchell, MD;  Location: [facility];  Service: Cardiovascular;  Laterality: Right;  SFA  . teeth removal     Allergies  Allergen Reactions  . No Known Allergies    Prior to Admission medications   Medication Sig Start Date End Date Taking? Authorizing Provider  ALPRAZolam (XANAX) 1 MG tablet TAKE 1/2 TO 1 (ONE-HALF TO ONE) TABLET BY MOUTH TWICE DAILY AS NEEDED FOR ANXIETY 07/08/18  Yes [name], MD  amitriptyline (ELAVIL) 50 MG tablet Take 1 tablet (50 mg total) by mouth at bedtime. 01/10/18  Yes [name], MD  amLODipine (NORVASC) 5 MG tablet Take 1 tablet (5 mg total) by mouth daily. 07/13/18  Yes [name], MD  clopidogrel (PLAVIX) 75 MG tablet Take 1 tablet (75 mg total) by mouth daily. 07/13/18  Yes [name], MD  lovastatin (MEVACOR) 20 MG tablet Take 1 tablet (20 mg total) by mouth at bedtime. 07/13/18  Yes [name], MD  oxyCODONE-acetaminophen (PERCOCET/ROXICET) 5-325 MG tablet Take 1 tablet by mouth every 6 (six) hours as needed for moderate pain. 07/26/16  Yes Rhyne, Samantha J, PA-C  sertraline (ZOLOFT) 25 MG tablet Take 1 tablet (25 mg total) by mouth daily. 01/10/18  Yes [name], MD   Social History   Socioeconomic History  . Marital status: Married    Spouse name: Not on file  . Number of children: Not on file  . Years of education: Not on file  . Highest education level:  Not on file  Occupational History  . Not on file  Social Needs  . Financial resource strain: Not on file  . Food insecurity    Worry: Not on file    Inability: Not on file  . Transportation needs    Medical: Not on file    Non-medical: Not on file  Tobacco Use  . Smoking status: Current Every Day Smoker    Packs/day: 0.50    Years: 47.00    Pack  years: 23.50    Types: Cigarettes  . Smokeless tobacco: Never Used  Substance and Sexual Activity  . Alcohol use: No    Alcohol/week: 0.0 standard drinks    Comment: no alcohol in 23 years (12/02/15  . Drug use: No  . Sexual activity: Not on file  Lifestyle  . Physical activity    Days per week: Not on file    Minutes per session: Not on file  . Stress: Not on file  Relationships  . Social Herbalist on phone: Not on file    Gets together: Not on file    Attends religious service: Not on file    Active member of club or organization: Not on file    Attends meetings of clubs or organizations: Not on file    Relationship status: Not on file  . Intimate partner violence    Fear of current or ex partner: Not on file    Emotionally abused: Not on file    Physically abused: Not on file    Forced sexual activity: Not on file  Other Topics Concern  . Not on file  Social History Narrative  . Not on file    Review of Systems  Constitutional: Negative for fatigue and unexpected weight change.  Eyes: Negative for visual disturbance.  Respiratory: Negative for cough, chest tightness and shortness of breath.   Cardiovascular: Negative for chest pain, palpitations and leg swelling.  Gastrointestinal: Negative for abdominal pain and blood in stool.  Neurological: Negative for dizziness, light-headedness and headaches.  Psychiatric/Behavioral: Negative for sleep disturbance and suicidal ideas. The patient is not nervous/anxious.       Objective:   Physical Exam Vitals signs reviewed.  Constitutional:      Appearance: He  is well-developed.  HENT:     Head: Normocephalic and atraumatic.  Eyes:     Pupils: Pupils are equal, round, and reactive to light.  Neck:     Vascular: No carotid bruit or JVD.  Cardiovascular:     Rate and Rhythm: Normal rate and regular rhythm.     Heart sounds: Normal heart sounds. No murmur.  Pulmonary:     Effort: Pulmonary effort is normal.     Breath sounds: Normal breath sounds. No rales.  Musculoskeletal:       Hands:  Skin:    General: Skin is warm and dry.  Neurological:     Mental Status: He is alert and oriented to person, place, and time.    Vitals:   07/13/18 1629  BP: 120/68  Pulse: 75  Resp: 14  Temp: 98.4 F (36.9 C)  TempSrc: Oral  SpO2: 96%  Weight: 140 lb 9.6 oz (63.8 kg)          Assessment & Plan:   Roy Moore is a 63 y.o. male Generalized anxiety disorder - Plan: sertraline (ZOLOFT) 25 MG tablet  -We will try sertraline 25 mg again with longer course to see if he has some benefit.  Would be cautious with increasing dose given current use of Elavil.  May need to adjust dose of Elavil first.  Continue Xanax 1 mg twice daily for now with plan for slow taper once anxiety improved.    Essential hypertension - Plan: amLODipine (NORVASC) 5 MG tablet, Comprehensive metabolic panel  -Stable, continue same regimen, labs pending  PVD (peripheral vascular disease) with claudication ([place]) - Plan: clopidogrel (PLAVIX) 75 MG tablet  -Reports stable symptoms, tolerating Plavix without new bleeding.  Continue same.  Hyperlipidemia, unspecified hyperlipidemia type - Plan: lovastatin (MEVACOR) 20 MG tablet, Lipid Panel  -Tolerating Mevacor, check lipids.  Ganglion cyst of wrist, left - Plan: Ambulatory referral to Hand Surgery  -Significant cystic structure noted on dorsal wrist, now affecting ability to achieve full grip.  Will refer to hand specialist to evaluate for aspiration/further evaluation.  Elevated serum creatinine  -Discussed avoidance  of NSAIDs, repeat testing as above.  Meds ordered this encounter  Medications  . amLODipine (NORVASC) 5 MG tablet    Sig: Take 1 tablet (5 mg total) by mouth daily.    Dispense:  90 tablet    Refill:  1  . clopidogrel (PLAVIX) 75 MG tablet    Sig: Take 1 tablet (75 mg total) by mouth daily.    Dispense:  30 tablet    Refill:  11  . lovastatin (MEVACOR) 20 MG tablet    Sig: Take 1 tablet (20 mg total) by mouth at bedtime.    Dispense:  90 tablet    Refill:  1  . sertraline (ZOLOFT) 25 MG tablet    Sig: Take 1 tablet (25 mg total) by mouth daily.    Dispense:  90 tablet    Refill:  1   Patient Instructions   For pain, try tylenol instead of ibuprofen as may hurt your kidneys. We can discuss pain at next visit if tylenol not sufficient.  I will recheck kidney function test with labs today.  No change in cholesterol or blood pressure medicine at this time.  Continue amitriptyline at bedtime for now, restart Zoloft once per day to see if that improves symptoms and lessen his need for Xanax but may take a full 6 weeks to see full effect.  If any new side effects with use of Zoloft and amitriptyline, stop Zoloft and follow-up to discuss further.  I referred you to a hand specialist to evaluate removal of the cyst.  Plavix was continued.  If any new bleeding be seen right away.  Return to the clinic or go to the nearest emergency room if any of your symptoms worsen or new symptoms occur.   If you have lab work done today you will be contacted with your lab results within the next 2 weeks.  If you have not heard from [place] then please contact us. The fastest way to get your results is to register for My Chart.   IF you received an x-ray today, you will receive an invoice from [facility]. Please contact [facility] at [phone] with questions or concerns regarding your invoice.   IF you received labwork today, you will receive an invoice from [facility]. Please contact  [facility] at [phone] with questions or concerns regarding your invoice.   Our billing staff will not be able to assist you with questions regarding bills from these companies.  You will be contacted with the lab results as soon as they are available. The fastest way to get your results is to activate your My Chart account. Instructions are located on the last page of this paperwork. If you have not heard from [place] regarding the results in 2 weeks, please contact this office.        Signed,   [name], MD [facility] [place].  07/13/18 5:54 PM

## 2018-07-13 NOTE — Patient Instructions (Addendum)
For pain, try tylenol instead of ibuprofen as may hurt your kidneys. We can discuss pain at next visit if tylenol not sufficient.  I will recheck kidney function test with labs today.  No change in cholesterol or blood pressure medicine at this time.  Continue amitriptyline at bedtime for now, restart Zoloft once per day to see if that improves symptoms and lessen his need for Xanax but may take a full 6 weeks to see full effect.  If any new side effects with use of Zoloft and amitriptyline, stop Zoloft and follow-up to discuss further.  I referred you to a hand specialist to evaluate removal of the cyst.  Plavix was continued.  If any new bleeding be seen right away.  Return to the clinic or go to the nearest emergency room if any of your symptoms worsen or new symptoms occur.   If you have lab work done today you will be contacted with your lab results within the next 2 weeks.  If you have not heard from [place] then please contact us. The fastest way to get your results is to register for My Chart.   IF you received an x-ray today, you will receive an invoice from [facility]. Please contact [facility] at [phone] with questions or concerns regarding your invoice.   IF you received labwork today, you will receive an invoice from [facility]. Please contact [facility] at [phone] with questions or concerns regarding your invoice.   Our billing staff will not be able to assist you with questions regarding bills from these companies.  You will be contacted with the lab results as soon as they are available. The fastest way to get your results is to activate your My Chart account. Instructions are located on the last page of this paperwork. If you have not heard from [place] regarding the results in 2 weeks, please contact this office.

## 2018-07-14 ENCOUNTER — Encounter: Payer: Self-pay | Admitting: Family Medicine

## 2018-07-14 LAB — LIPID PANEL
Chol/HDL Ratio: 3.3 ratio (ref 0.0–5.0)
Cholesterol, Total: 144 mg/dL (ref 100–199)
HDL: 44 mg/dL (ref >39)
LDL Calculated: 87 mg/dL (ref 0–99)
Triglycerides: 64 mg/dL (ref 0–149)
VLDL Cholesterol Cal: 13 mg/dL (ref 5–40)

## 2018-07-14 LAB — COMPREHENSIVE METABOLIC PANEL
ALT: 10 IU/L (ref 0–44)
AST: 16 IU/L (ref 0–40)
Albumin/Globulin Ratio: 1.5 (ref 1.2–2.2)
Albumin: 4 g/dL (ref 3.8–4.8)
Alkaline Phosphatase: 82 IU/L (ref 39–117)
BUN/Creatinine Ratio: 12 (ref 10–24)
BUN: 16 mg/dL (ref 8–27)
Bilirubin Total: 0.3 mg/dL (ref 0.0–1.2)
CO2: 20 mmol/L (ref 20–29)
Calcium: 8.9 mg/dL (ref 8.6–10.2)
Chloride: 102 mmol/L (ref 96–106)
Creatinine, Ser: 1.37 mg/dL — ABNORMAL HIGH (ref 0.76–1.27)
GFR calc Af Amer: 63 mL/min/{1.73_m2} (ref >59)
GFR calc non Af Amer: 55 mL/min/{1.73_m2} — ABNORMAL LOW (ref >59)
Globulin, Total: 2.7 g/dL (ref 1.5–4.5)
Glucose: 89 mg/dL (ref 65–99)
Potassium: 4.5 mmol/L (ref 3.5–5.2)
Sodium: 139 mmol/L (ref 134–144)
Total Protein: 6.7 g/dL (ref 6.0–8.5)

## 2018-07-20 ENCOUNTER — Ambulatory Visit (INDEPENDENT_AMBULATORY_CARE_PROVIDER_SITE_OTHER): Payer: Medicare HMO | Admitting: Orthopaedic Surgery

## 2018-07-20 ENCOUNTER — Other Ambulatory Visit: Payer: Self-pay

## 2018-07-20 ENCOUNTER — Ambulatory Visit: Payer: Self-pay

## 2018-07-20 DIAGNOSIS — M67432 Ganglion, left wrist: Secondary | ICD-10-CM | POA: Diagnosis not present

## 2018-07-20 MED ORDER — LIDOCAINE HCL 1 % IJ SOLN
3.0000 mL | INTRAMUSCULAR | Status: AC | PRN
  Administered 2018-07-20: 3 mL

## 2018-07-20 NOTE — Progress Notes (Signed)
Office Visit Note   Patient: Roy Moore           Date of Birth: 1955/10/01           MRN: [id number] Visit Date: 07/20/2018              Requested by: [name], MD [address] [place],  [place] [address] PCP: [name], MD   Assessment & Plan: Visit Diagnoses:  1. Ganglion cyst of wrist, left     Plan: Impression is left wrist dorsal ganglion cyst.  I attempted aspiration but was unable to obtain any cyst fluid therefore we will obtain an MRI of the left wrist to fully evaluate the mass and rule out malignancy.  We will see him back after the MRI.  Follow-Up Instructions: 2 weeks  Orders:  Orders Placed This Encounter  Procedures  . MR Wrist Left w/ contrast   No orders of the defined types were placed in this encounter.     Procedures: Hand/UE Inj: L wrist for dorsal carpal ganglion on 07/20/2018 3:26 PM Indications: pain Details: 25 G needle, dorsal approach Medications: 3 mL lidocaine 1 % Outcome: tolerated well, no immediate complications Patient was prepped and draped in the usual sterile fashion.       Clinical Data: No additional findings.   Subjective: Chief Complaint  Patient presents with  . Left Wrist - Pain    Roy Moore is a pleasant 63 year old gentleman comes in for evaluation slowly growing dorsal wrist mass of his left hand.  It is been bothering him for the last couple months.  He states that the cyst has been there for many years.  He denies any constitutional symptoms.  There is some mild tenderness to palpation.  Denies a history of cancer.   Review of Systems  Constitutional: Negative.   All other systems reviewed and are negative.    Objective: Vital Signs: There were no vitals taken for this visit.  Physical Exam Vitals signs and nursing note reviewed.  Constitutional:      Appearance: He is well-developed.  HENT:     Head: Normocephalic and atraumatic.  Eyes:     Pupils: Pupils are equal, round, and  reactive to light.  Neck:     Musculoskeletal: Neck supple.  Pulmonary:     Effort: Pulmonary effort is normal.  Abdominal:     Palpations: Abdomen is soft.  Musculoskeletal: Normal range of motion.  Skin:    General: Skin is warm.  Neurological:     Mental Status: He is alert and oriented to person, place, and time.  Psychiatric:        Behavior: Behavior normal.        Thought Content: Thought content normal.        Judgment: Judgment normal.     Ortho Exam Left hand and wrist exam shows a palpable soft mass on the dorsal aspect of his hand and wrist.  There is no overlying skin lesions or changes.  He does endorse some limitation with wrist flexion and finger flexion as a result of the cyst.  No neurovascular compromise. Specialty Comments:  No specialty comments available.  Imaging: No results found.   PMFS History: Patient Active Problem List   Diagnosis Date Noted  . Ganglion cyst of dorsum of left wrist 06/13/2018  . PAD (peripheral artery disease) ([place]) 07/24/2016  . Peripheral arterial disease ([place]) 07/17/2016  . Lumbar stenosis with neurogenic claudication 12/03/2015  . CVA, old,  hemiparesis ([place]) 03/24/2011  . ANXIETY DISORDER 10/04/2007  . TOBACCO ABUSE 09/30/2007  . GERD 09/30/2007  . DYSLIPIDEMIA 09/15/2007  . STROKE WITH LEFT HEMIPARESIS 09/15/2007   Past Medical History:  Diagnosis Date  . Anxiety   . Complication of anesthesia   . Hyperlipidemia   . Peripheral vascular disease ([place])   . PONV (postoperative nausea and vomiting)   . Stroke [facility]) 2009   no residual    Family History  Problem Relation Age of Onset  . Colon cancer Neg Hx   . Rectal cancer Neg Hx   . Stomach cancer Neg Hx   . Esophageal cancer Neg Hx     Past Surgical History:  Procedure Laterality Date  . ABDOMINAL AORTOGRAM W/LOWER EXTREMITY [name] 06/09/2016   Procedure: Abdominal Aortogram w/Lower Extremity;  Surgeon: Serafina Mitchell, MD;  Location: [facility];   Service: Cardiovascular;  Laterality: [name];  . BACK SURGERY     10 yrs ago or so-fusion in neck   . CERVICAL FUSION    . COLONOSCOPY     15-20 yrs ago-normal per pt.   . COLONOSCOPY W/ POLYPECTOMY    . CYSTOSCOPY     kidney stools removed   . FEMORAL-POPLITEAL BYPASS GRAFT Right 07/24/2016   Procedure: RIGHT FEMORAL TO ABOVE KNEE POPLITEAL ARTERY BYPASS GRAFT;  Surgeon: Serafina Mitchell, MD;  Location: [facility];  Service: Vascular;  Laterality: Right;  . LUMBAR LAMINECTOMY/DECOMPRESSION MICRODISCECTOMY Bilateral 12/03/2015   Procedure: Microdiscectomy - bilateral - Lumbar four-lumbar five;  Surgeon: Earnie Larsson, MD;  Location: [facility];  Service: Neurosurgery;  Laterality: Bilateral;  . PERIPHERAL VASCULAR INTERVENTION Right 06/09/2016   Procedure: Peripheral Vascular Intervention;  Surgeon: Serafina Mitchell, MD;  Location: [facility];  Service: Cardiovascular;  Laterality: Right;  SFA  . teeth removal     Social History   Occupational History  . Not on file  Tobacco Use  . Smoking status: Current Every Day Smoker    Packs/day: 0.50    Years: 47.00    Pack years: 23.50    Types: Cigarettes  . Smokeless tobacco: Never Used  Substance and Sexual Activity  . Alcohol use: No    Alcohol/week: 0.0 standard drinks    Comment: no alcohol in 23 years (12/02/15  . Drug use: No  . Sexual activity: Not on file

## 2018-08-10 ENCOUNTER — Other Ambulatory Visit: Payer: Self-pay | Admitting: Family Medicine

## 2018-08-10 ENCOUNTER — Telehealth: Payer: Self-pay | Admitting: Family Medicine

## 2018-08-10 DIAGNOSIS — G4700 Insomnia, unspecified: Secondary | ICD-10-CM

## 2018-08-10 DIAGNOSIS — F411 Generalized anxiety disorder: Secondary | ICD-10-CM

## 2018-08-10 NOTE — Telephone Encounter (Signed)
Medication Refill - Medication:  ALPRAZolam (XANAX) 1 MG tablet   Has the patient contacted their pharmacy? No. (Agent: If no, request that the patient contact the pharmacy for the refill.) (Agent: If yes, when and what did the pharmacy advise?)  Preferred Pharmacy (with phone number or street name): [facility]    Agent: Please be advised that RX refills may take up to 3 business days. We ask that you follow-up with your pharmacy.

## 2018-08-11 MED ORDER — ALPRAZOLAM 1 MG PO TABS: ORAL_TABLET | ORAL | 0 refills | Status: DC

## 2018-08-11 NOTE — Telephone Encounter (Signed)
Patient is requesting a refill of the following medications: Requested Prescriptions    No prescriptions requested or ordered in this encounter    Date of patient request: 08/10/18 Last office visit: 07/13/18 Date of last refill: 07/08/18 Last refill amount:60 Follow up time period per chart: 10/12/18

## 2018-08-11 NOTE — Telephone Encounter (Signed)
Pt is out of meds and is calling about status of refill

## 2018-08-11 NOTE — Telephone Encounter (Signed)
Office visit June 17.  Treatment plan discussed at that time.  Controlled substance database (PDMP) reviewed.  Last prescription for alprazolam 1 mg #60 filled on June 15.  New refill ordered.

## 2018-09-08 ENCOUNTER — Other Ambulatory Visit: Payer: Self-pay | Admitting: Family Medicine

## 2018-09-08 DIAGNOSIS — G4700 Insomnia, unspecified: Secondary | ICD-10-CM

## 2018-09-08 DIAGNOSIS — F411 Generalized anxiety disorder: Secondary | ICD-10-CM

## 2018-09-08 NOTE — Telephone Encounter (Signed)
Requested medication (s) are due for refill today: yes  Requested medication (s) are on the active medication list: yes  Last refill:  08/10/2018  Future visit scheduled: yes  Notes to clinic:  Not delegated    Requested Prescriptions  Pending Prescriptions Disp Refills   ALPRAZolam (XANAX) 1 MG tablet [Pharmacy Med Name: ALPRAZolam 1 MG Oral Tablet] 60 tablet 0    Sig: TAKE 1/2 TO 1 (ONE-HALF TO ONE) TABLET BY MOUTH TWICE DAILY AS NEEDED FOR ANXIETY     Not Delegated - Psychiatry:  Anxiolytics/Hypnotics Failed - 09/08/2018  2:53 PM      Failed - This refill cannot be delegated      Failed - Urine Drug Screen completed in last 360 days.      Passed - Valid encounter within last 6 months    Recent Outpatient Visits          1 month ago Generalized anxiety disorder   [facility], Ranell Patrick, MD   2 months ago Ganglion cyst of dorsum of left wrist   [facility], Ines Bloomer, MD   8 months ago Essential hypertension   [facility], Ranell Patrick, MD   1 year ago Elevated alkaline phosphatase level   [facility], Ranell Patrick, MD   1 year ago Elevated alkaline phosphatase level   [facility], Ranell Patrick, MD      Future Appointments            In 1 week Leandrew Koyanagi, MD [facility]   In 1 month [name], MD [facility] [place]

## 2018-09-09 NOTE — Telephone Encounter (Signed)
Controlled substance database (PDMP) reviewed.  Alprazolam 1 mg #60 filled July 17.  Previous prescription June 15. Plan to follow-up next month.  Prescription refilled.

## 2018-09-09 NOTE — Telephone Encounter (Signed)
Patient is requesting a refill of the following medications: Requested Prescriptions   Pending Prescriptions Disp Refills  . ALPRAZolam (XANAX) 1 MG tablet [Pharmacy Med Name: ALPRAZolam 1 MG Oral Tablet] 60 tablet 0    Sig: TAKE 1/2 TO 1 (ONE-HALF TO ONE) TABLET BY MOUTH TWICE DAILY AS NEEDED FOR ANXIETY    Date of patient request: 09/08/18 Last office visit: 07/13/18 Date of last refill: 08/12/18 Last refill amount: 60 0rf Follow up time period per chart: 10/12/18

## 2018-09-10 ENCOUNTER — Other Ambulatory Visit: Payer: Self-pay

## 2018-09-10 ENCOUNTER — Ambulatory Visit
Admission: RE | Admit: 2018-09-10 | Discharge: 2018-09-10 | Disposition: A | Discharge status: Home / Self Care | Payer: Medicare HMO | Source: Ambulatory Visit | Attending: Orthopaedic Surgery | Admitting: Orthopaedic Surgery

## 2018-09-10 DIAGNOSIS — R600 Localized edema: Secondary | ICD-10-CM | POA: Diagnosis not present

## 2018-09-10 DIAGNOSIS — M67432 Ganglion, left wrist: Secondary | ICD-10-CM

## 2018-09-10 MED ORDER — GADOBENATE DIMEGLUMINE 529 MG/ML IV SOLN
14.0000 mL | Freq: Once | INTRAVENOUS | Status: AC | PRN
  Administered 2018-09-10: 14 mL via INTRAVENOUS

## 2018-09-15 ENCOUNTER — Ambulatory Visit: Payer: Medicare HMO | Admitting: Orthopaedic Surgery

## 2018-09-20 ENCOUNTER — Encounter: Payer: Self-pay | Admitting: Orthopaedic Surgery

## 2018-09-20 ENCOUNTER — Ambulatory Visit (INDEPENDENT_AMBULATORY_CARE_PROVIDER_SITE_OTHER): Payer: Medicare HMO | Admitting: Orthopaedic Surgery

## 2018-09-20 VITALS — Wt 142.0 lb

## 2018-09-20 DIAGNOSIS — M65932 Unspecified synovitis and tenosynovitis, left forearm: Secondary | ICD-10-CM | POA: Insufficient documentation

## 2018-09-20 DIAGNOSIS — M65832 Other synovitis and tenosynovitis, left forearm: Secondary | ICD-10-CM | POA: Diagnosis not present

## 2018-09-20 NOTE — Progress Notes (Signed)
Office Visit Note   Patient: Roy Moore           Date of Birth: Jun 12, 1955           MRN: [id number] Visit Date: 09/20/2018              Requested by: [name], MD [address] [place],  [place] [address] PCP: [name], MD   Assessment & Plan: Visit Diagnoses:  1. Extensor tenosynovitis of left wrist     Plan: MRI images and findings are consistent with significant tenosynovitis of the left wrist extensor tendons mainly of the fourth extensor compartment.  He does have erosive changes of the carpal bones suspicious for inflammatory arthropathy.  We will obtain arthritis panel today.  I have also discussed tenolysis of the extensor tendons to prevent tendon rupture.  He is very active and uses hands constantly.  He provides full care to his disabled wife.  Details of the surgery and the risks and rehab and recovery were discussed today.  Questions encouraged and answered.  Follow-Up Instructions: Return for 1 week postop visit.   Orders:  No orders of the defined types were placed in this encounter.  No orders of the defined types were placed in this encounter.     Procedures: No procedures performed   Clinical Data: No additional findings.   Subjective: Chief Complaint  Patient presents with  . Right Wrist - Follow-up    Roy Moore is here to review his left wrist MRI.  He denies any changes in his symptoms.   Review of Systems  Constitutional: Negative.   All other systems reviewed and are negative.    Objective: Vital Signs: Wt 142 lb (64.4 kg)   BMI 20.97 kg/m   Physical Exam Vitals signs and nursing note reviewed.  Constitutional:      Appearance: He is well-developed.  Pulmonary:     Effort: Pulmonary effort is normal.  Abdominal:     Palpations: Abdomen is soft.  Skin:    General: Skin is warm.  Neurological:     Mental Status: He is alert and oriented to person, place, and time.  Psychiatric:        Behavior: Behavior  normal.        Thought Content: Thought content normal.        Judgment: Judgment normal.     Ortho Exam Left wrist exam is unchanged. Specialty Comments:  No specialty comments available.  Imaging: No results found.   PMFS History: Patient Active Problem List   Diagnosis Date Noted  . Extensor tenosynovitis of left wrist 09/20/2018  . Ganglion cyst of dorsum of left wrist 06/13/2018  . PAD (peripheral artery disease) ([place]) 07/24/2016  . Peripheral arterial disease ([place]) 07/17/2016  . Lumbar stenosis with neurogenic claudication 12/03/2015  . CVA, old, hemiparesis ([place]) 03/24/2011  . ANXIETY DISORDER 10/04/2007  . TOBACCO ABUSE 09/30/2007  . GERD 09/30/2007  . DYSLIPIDEMIA 09/15/2007  . STROKE WITH LEFT HEMIPARESIS 09/15/2007   Past Medical History:  Diagnosis Date  . Anxiety   . Complication of anesthesia   . Hyperlipidemia   . Peripheral vascular disease ([place])   . PONV (postoperative nausea and vomiting)   . Stroke [facility]) 2009   no residual    Family History  Problem Relation Age of Onset  . Colon cancer Neg Hx   . Rectal cancer Neg Hx   . Stomach cancer Neg Hx   . Esophageal cancer Neg Hx  Past Surgical History:  Procedure Laterality Date  . ABDOMINAL AORTOGRAM W/LOWER EXTREMITY [name] 06/09/2016   Procedure: Abdominal Aortogram w/Lower Extremity;  Surgeon: Serafina Mitchell, MD;  Location: [place] [facility];  Service: Cardiovascular;  Laterality: [name];  . BACK SURGERY     10 yrs ago or so-fusion in neck   . CERVICAL FUSION    . COLONOSCOPY     15-20 yrs ago-normal per pt.   . COLONOSCOPY W/ POLYPECTOMY    . CYSTOSCOPY     kidney stools removed   . FEMORAL-POPLITEAL BYPASS GRAFT Right 07/24/2016   Procedure: RIGHT FEMORAL TO ABOVE KNEE POPLITEAL ARTERY BYPASS GRAFT;  Surgeon: Serafina Mitchell, MD;  Location: [facility];  Service: Vascular;  Laterality: Right;  . LUMBAR LAMINECTOMY/DECOMPRESSION MICRODISCECTOMY Bilateral 12/03/2015   Procedure: Microdiscectomy -  bilateral - Lumbar four-lumbar five;  Surgeon: Earnie Larsson, MD;  Location: [facility];  Service: Neurosurgery;  Laterality: Bilateral;  . PERIPHERAL VASCULAR INTERVENTION Right 06/09/2016   Procedure: Peripheral Vascular Intervention;  Surgeon: Serafina Mitchell, MD;  Location: [facility];  Service: Cardiovascular;  Laterality: Right;  SFA  . teeth removal     Social History   Occupational History  . Not on file  Tobacco Use  . Smoking status: Current Every Day Smoker    Packs/day: 0.50    Years: 47.00    Pack years: 23.50    Types: Cigarettes  . Smokeless tobacco: Never Used  Substance and Sexual Activity  . Alcohol use: No    Alcohol/week: 0.0 standard drinks    Comment: no alcohol in 23 years (12/02/15  . Drug use: No  . Sexual activity: Not on file

## 2018-09-20 NOTE — Addendum Note (Signed)
Addended by: Minda Ditto, Geoffery Spruce on: 09/20/2018 03:33 PM   Modules accepted: Orders

## 2018-09-21 LAB — ANA: Anti Nuclear Antibody (ANA): NEGATIVE

## 2018-09-21 LAB — RHEUMATOID FACTOR: Rheumatoid fact SerPl-aCnc: 371 IU/mL — ABNORMAL HIGH (ref <14)

## 2018-09-21 LAB — URIC ACID: Uric Acid, Serum: 5.1 mg/dL (ref 4.0–8.0)

## 2018-09-21 LAB — SEDIMENTATION RATE: Sed Rate: 41 mm/h — ABNORMAL HIGH (ref 0–20)

## 2018-09-21 NOTE — Addendum Note (Signed)
Addended byLaurann Montana on: 09/21/2018 04:48 PM   Modules accepted: Orders

## 2018-09-21 NOTE — Progress Notes (Signed)
Needs referral to [facility] for suspected RA.  Thanks.

## 2018-10-06 ENCOUNTER — Encounter (HOSPITAL_BASED_OUTPATIENT_CLINIC_OR_DEPARTMENT_OTHER): Payer: Self-pay | Admitting: *Deleted

## 2018-10-06 ENCOUNTER — Other Ambulatory Visit: Payer: Self-pay

## 2018-10-08 ENCOUNTER — Other Ambulatory Visit (HOSPITAL_COMMUNITY)
Admission: RE | Admit: 2018-10-08 | Discharge: 2018-10-08 | Disposition: A | Discharge status: Home / Self Care | Payer: Medicare HMO | Source: Ambulatory Visit | Attending: Orthopaedic Surgery | Admitting: Orthopaedic Surgery

## 2018-10-08 DIAGNOSIS — Z01812 Encounter for preprocedural laboratory examination: Secondary | ICD-10-CM | POA: Diagnosis not present

## 2018-10-08 DIAGNOSIS — Z20828 Contact with and (suspected) exposure to other viral communicable diseases: Secondary | ICD-10-CM | POA: Diagnosis not present

## 2018-10-09 LAB — NOVEL CORONAVIRUS, NAA (HOSP ORDER, SEND-OUT TO REF LAB; TAT 18-24 HRS): SARS-CoV-2, NAA: NOT DETECTED

## 2018-10-10 ENCOUNTER — Telehealth: Payer: Self-pay | Admitting: Family Medicine

## 2018-10-10 ENCOUNTER — Encounter (HOSPITAL_BASED_OUTPATIENT_CLINIC_OR_DEPARTMENT_OTHER)
Admission: RE | Admit: 2018-10-10 | Discharge: 2018-10-10 | Disposition: A | Discharge status: Home / Self Care | Payer: Medicare HMO | Source: Ambulatory Visit | Attending: Orthopaedic Surgery | Admitting: Orthopaedic Surgery

## 2018-10-10 DIAGNOSIS — Z01812 Encounter for preprocedural laboratory examination: Secondary | ICD-10-CM | POA: Diagnosis not present

## 2018-10-10 NOTE — Progress Notes (Signed)

## 2018-10-10 NOTE — Telephone Encounter (Signed)
Can someone call and reschedule pt appointment

## 2018-10-10 NOTE — Telephone Encounter (Signed)
Patient is having surgery on the same day as his appnt would like someone to call him immediately because he will  Not be able to come in for his medication refill   [phone]

## 2018-10-11 ENCOUNTER — Telehealth: Payer: Self-pay | Admitting: Family Medicine

## 2018-10-11 DIAGNOSIS — G4700 Insomnia, unspecified: Secondary | ICD-10-CM

## 2018-10-11 DIAGNOSIS — F411 Generalized anxiety disorder: Secondary | ICD-10-CM

## 2018-10-11 NOTE — Anesthesia Preprocedure Evaluation (Addendum)
Anesthesia Evaluation  Patient identified by MRN, date of birth, ID band Patient awake    Reviewed: Allergy & Precautions, NPO status , Patient's Chart, lab work & pertinent test results  History of Anesthesia Complications Negative for: history of anesthetic complications  Airway Mallampati: I  TM Distance: >3 FB Neck ROM: Limited    Dental  (+) Edentulous Upper, Edentulous Lower, Dental Advisory Given   Pulmonary neg pulmonary ROS, Current Smoker,    Pulmonary exam normal        Cardiovascular + Peripheral Vascular Disease  negative cardio ROS Normal cardiovascular exam  ECHO 8/9 LEFT VENTRICLE:  - Left ventricular size was normal.  - Overall left ventricular systolic function was normal.  - Left ventricular ejection fraction was estimated , range being 60     % to 65 %.  - Left ventricular wall thickness was mildly increased.    Neuro/Psych PSYCHIATRIC DISORDERS Anxiety CVA, No Residual Symptoms negative neurological ROS  negative psych ROS   GI/Hepatic negative GI ROS, Neg liver ROS, GERD  ,  Endo/Other  negative endocrine ROS  Renal/GU negative Renal ROS  negative genitourinary   Musculoskeletal negative musculoskeletal ROS (+)   Abdominal   Peds negative pediatric ROS (+)  Hematology negative hematology ROS (+)   Anesthesia Other Findings   Reproductive/Obstetrics negative OB ROS                           Anesthesia Physical  Anesthesia Plan  ASA: III  Anesthesia Plan: General   Post-op Pain Management:    Induction: Intravenous  PONV Risk Score and Plan: 2  Airway Management Planned: Oral ETT and LMA  Additional Equipment:   Intra-op Plan:   Post-operative Plan: Extubation in OR  Informed Consent: I have reviewed the patients History and Physical, chart, labs and discussed the procedure including the risks, benefits and alternatives for the proposed  anesthesia with the patient or authorized representative who has indicated his/her understanding and acceptance.     Dental advisory given  Plan Discussed with: CRNA, Anesthesiologist and Surgeon  Anesthesia Plan Comments:        Anesthesia Quick Evaluation

## 2018-10-11 NOTE — Telephone Encounter (Signed)
Copied from [place] [phone]. Topic: General - Other >> Oct 11, 2018 12:44 PM Carolyn Stare wrote: Pt said he is having surgery on 10/12/2018 and will not be ale to keep his appt an is requesting a call back to discuss refills

## 2018-10-12 ENCOUNTER — Other Ambulatory Visit: Payer: Self-pay | Admitting: Family Medicine

## 2018-10-12 ENCOUNTER — Encounter (HOSPITAL_BASED_OUTPATIENT_CLINIC_OR_DEPARTMENT_OTHER): Payer: Self-pay | Admitting: Anesthesiology

## 2018-10-12 ENCOUNTER — Ambulatory Visit: Payer: Medicare HMO | Admitting: Family Medicine

## 2018-10-12 ENCOUNTER — Ambulatory Visit (HOSPITAL_BASED_OUTPATIENT_CLINIC_OR_DEPARTMENT_OTHER)
Admission: RE | Admit: 2018-10-12 | Discharge: 2018-10-12 | Disposition: A | Discharge status: Home / Self Care | Payer: Medicare HMO | Attending: Orthopaedic Surgery | Admitting: Orthopaedic Surgery

## 2018-10-12 ENCOUNTER — Encounter (HOSPITAL_BASED_OUTPATIENT_CLINIC_OR_DEPARTMENT_OTHER)
Admission: RE | Disposition: A | Discharge status: Home / Self Care | Payer: Self-pay | Source: Home / Self Care | Attending: Orthopaedic Surgery

## 2018-10-12 ENCOUNTER — Ambulatory Visit (HOSPITAL_BASED_OUTPATIENT_CLINIC_OR_DEPARTMENT_OTHER): Payer: Medicare HMO | Admitting: Anesthesiology

## 2018-10-12 ENCOUNTER — Other Ambulatory Visit: Payer: Self-pay

## 2018-10-12 DIAGNOSIS — Z981 Arthrodesis status: Secondary | ICD-10-CM | POA: Diagnosis not present

## 2018-10-12 DIAGNOSIS — F411 Generalized anxiety disorder: Secondary | ICD-10-CM

## 2018-10-12 DIAGNOSIS — Z79899 Other long term (current) drug therapy: Secondary | ICD-10-CM | POA: Insufficient documentation

## 2018-10-12 DIAGNOSIS — G4700 Insomnia, unspecified: Secondary | ICD-10-CM

## 2018-10-12 DIAGNOSIS — M65832 Other synovitis and tenosynovitis, left forearm: Secondary | ICD-10-CM

## 2018-10-12 DIAGNOSIS — Z8673 Personal history of transient ischemic attack (TIA), and cerebral infarction without residual deficits: Secondary | ICD-10-CM | POA: Diagnosis not present

## 2018-10-12 DIAGNOSIS — F17210 Nicotine dependence, cigarettes, uncomplicated: Secondary | ICD-10-CM | POA: Diagnosis not present

## 2018-10-12 DIAGNOSIS — I739 Peripheral vascular disease, unspecified: Secondary | ICD-10-CM | POA: Diagnosis not present

## 2018-10-12 DIAGNOSIS — F419 Anxiety disorder, unspecified: Secondary | ICD-10-CM | POA: Diagnosis not present

## 2018-10-12 DIAGNOSIS — K219 Gastro-esophageal reflux disease without esophagitis: Secondary | ICD-10-CM | POA: Insufficient documentation

## 2018-10-12 DIAGNOSIS — M659 Synovitis and tenosynovitis, unspecified: Secondary | ICD-10-CM | POA: Insufficient documentation

## 2018-10-12 DIAGNOSIS — E785 Hyperlipidemia, unspecified: Secondary | ICD-10-CM | POA: Diagnosis not present

## 2018-10-12 DIAGNOSIS — M069 Rheumatoid arthritis, unspecified: Secondary | ICD-10-CM | POA: Insufficient documentation

## 2018-10-12 DIAGNOSIS — G8918 Other acute postprocedural pain: Secondary | ICD-10-CM | POA: Diagnosis not present

## 2018-10-12 DIAGNOSIS — Z86010 Personal history of colonic polyps: Secondary | ICD-10-CM | POA: Diagnosis not present

## 2018-10-12 DIAGNOSIS — M65842 Other synovitis and tenosynovitis, left hand: Secondary | ICD-10-CM | POA: Diagnosis not present

## 2018-10-12 DIAGNOSIS — R69 Illness, unspecified: Secondary | ICD-10-CM | POA: Diagnosis not present

## 2018-10-12 HISTORY — PX: REPAIR EXTENSOR TENDON: SHX5382

## 2018-10-12 SURGERY — REPAIR, TENDON, EXTENSOR
Anesthesia: General | Site: Wrist | Laterality: Left

## 2018-10-12 MED ORDER — HYDROCODONE-ACETAMINOPHEN 7.5-325 MG PO TABS: 1.0000 | ORAL_TABLET | Freq: Three times a day (TID) | ORAL | 0 refills | Status: DC | PRN

## 2018-10-12 MED ORDER — DEXAMETHASONE SODIUM PHOSPHATE 10 MG/ML IJ SOLN
INTRAMUSCULAR | Status: AC
  Filled 2018-10-12: qty 1

## 2018-10-12 MED ORDER — MEPERIDINE HCL 25 MG/ML IJ SOLN: 6.2500 mg | INTRAMUSCULAR | Status: DC | PRN

## 2018-10-12 MED ORDER — CHLORHEXIDINE GLUCONATE 4 % EX LIQD: 60.0000 mL | Freq: Once | CUTANEOUS | Status: DC

## 2018-10-12 MED ORDER — MIDAZOLAM HCL 2 MG/2ML IJ SOLN
INTRAMUSCULAR | Status: AC
  Filled 2018-10-12: qty 2

## 2018-10-12 MED ORDER — PROPOFOL 10 MG/ML IV BOLUS
INTRAVENOUS | Status: DC | PRN
  Administered 2018-10-12: 150 mg via INTRAVENOUS

## 2018-10-12 MED ORDER — EPHEDRINE SULFATE 50 MG/ML IJ SOLN
INTRAMUSCULAR | Status: DC | PRN
  Administered 2018-10-12 (×2): 10 mg via INTRAVENOUS

## 2018-10-12 MED ORDER — OXYCODONE HCL 5 MG/5ML PO SOLN: 5.0000 mg | Freq: Once | ORAL | Status: DC | PRN

## 2018-10-12 MED ORDER — LIDOCAINE HCL (CARDIAC) PF 100 MG/5ML IV SOSY
PREFILLED_SYRINGE | INTRAVENOUS | Status: DC | PRN
  Administered 2018-10-12: 60 mg via INTRAVENOUS

## 2018-10-12 MED ORDER — BUPIVACAINE HCL (PF) 0.25 % IJ SOLN
INTRAMUSCULAR | Status: AC
  Filled 2018-10-12: qty 30

## 2018-10-12 MED ORDER — FENTANYL CITRATE (PF) 100 MCG/2ML IJ SOLN
INTRAMUSCULAR | Status: AC
  Filled 2018-10-12: qty 2

## 2018-10-12 MED ORDER — ONDANSETRON HCL 4 MG/2ML IJ SOLN
INTRAMUSCULAR | Status: DC | PRN
  Administered 2018-10-12: 4 mg via INTRAVENOUS

## 2018-10-12 MED ORDER — PROPOFOL 500 MG/50ML IV EMUL
INTRAVENOUS | Status: AC
  Filled 2018-10-12: qty 50

## 2018-10-12 MED ORDER — FENTANYL CITRATE (PF) 100 MCG/2ML IJ SOLN
50.0000 ug | INTRAMUSCULAR | Status: DC | PRN
  Administered 2018-10-12: 100 ug via INTRAVENOUS

## 2018-10-12 MED ORDER — ONDANSETRON HCL 4 MG/2ML IJ SOLN: 4.0000 mg | Freq: Once | INTRAMUSCULAR | Status: DC | PRN

## 2018-10-12 MED ORDER — CEFAZOLIN SODIUM-DEXTROSE 2-4 GM/100ML-% IV SOLN
INTRAVENOUS | Status: AC
  Filled 2018-10-12: qty 100

## 2018-10-12 MED ORDER — ROPIVACAINE HCL 7.5 MG/ML IJ SOLN
INTRAMUSCULAR | Status: DC | PRN
  Administered 2018-10-12: 20 mL via PERINEURAL

## 2018-10-12 MED ORDER — LIDOCAINE 2% (20 MG/ML) 5 ML SYRINGE
INTRAMUSCULAR | Status: AC
  Filled 2018-10-12: qty 5

## 2018-10-12 MED ORDER — LACTATED RINGERS IV SOLN: INTRAVENOUS | Status: DC

## 2018-10-12 MED ORDER — OXYCODONE HCL 5 MG PO TABS: 5.0000 mg | ORAL_TABLET | Freq: Once | ORAL | Status: DC | PRN

## 2018-10-12 MED ORDER — MIDAZOLAM HCL 2 MG/2ML IJ SOLN
1.0000 mg | INTRAMUSCULAR | Status: DC | PRN
  Administered 2018-10-12: 2 mg via INTRAVENOUS

## 2018-10-12 MED ORDER — ALPRAZOLAM 1 MG PO TABS: ORAL_TABLET | ORAL | 0 refills | Status: DC

## 2018-10-12 MED ORDER — CEFAZOLIN SODIUM-DEXTROSE 2-4 GM/100ML-% IV SOLN
2.0000 g | INTRAVENOUS | Status: AC
  Administered 2018-10-12: 2 g via INTRAVENOUS

## 2018-10-12 MED ORDER — LACTATED RINGERS IV SOLN
INTRAVENOUS | Status: DC
  Administered 2018-10-12: 08:00:00 via INTRAVENOUS

## 2018-10-12 MED ORDER — DEXAMETHASONE SODIUM PHOSPHATE 10 MG/ML IJ SOLN
INTRAMUSCULAR | Status: DC | PRN
  Administered 2018-10-12: 5 mg via INTRAVENOUS

## 2018-10-12 MED ORDER — ACETAMINOPHEN 160 MG/5ML PO SOLN: 325.0000 mg | ORAL | Status: DC | PRN

## 2018-10-12 MED ORDER — ACETAMINOPHEN 325 MG PO TABS: 325.0000 mg | ORAL_TABLET | ORAL | Status: DC | PRN

## 2018-10-12 MED ORDER — FENTANYL CITRATE (PF) 100 MCG/2ML IJ SOLN: 25.0000 ug | INTRAMUSCULAR | Status: DC | PRN

## 2018-10-12 MED ORDER — ONDANSETRON HCL 4 MG/2ML IJ SOLN
INTRAMUSCULAR | Status: AC
  Filled 2018-10-12: qty 2

## 2018-10-12 MED ORDER — ONDANSETRON HCL 4 MG PO TABS: 4.0000 mg | ORAL_TABLET | Freq: Three times a day (TID) | ORAL | 0 refills | Status: DC | PRN

## 2018-10-12 MED ORDER — OXYCODONE HCL 5 MG PO TABS
ORAL_TABLET | ORAL | Status: AC
  Filled 2018-10-12: qty 1

## 2018-10-12 SURGICAL SUPPLY — 72 items
BAND INSRT 18 STRL LF DISP RB (MISCELLANEOUS) ×2
BAND RUBBER #18 3X1/16 STRL (MISCELLANEOUS) ×4 IMPLANT
BLADE MINI RND TIP GREEN BEAV (BLADE) ×2 IMPLANT
BLADE SURG 15 STRL LF DISP TIS (BLADE) ×2 IMPLANT
BLADE SURG 15 STRL SS (BLADE) ×4
BNDG CMPR 9X4 STRL LF SNTH (GAUZE/BANDAGES/DRESSINGS) ×1
BNDG ELASTIC 3X5.8 VLCR STR LF (GAUZE/BANDAGES/DRESSINGS) ×2 IMPLANT
BNDG ESMARK 4X9 LF (GAUZE/BANDAGES/DRESSINGS) ×2 IMPLANT
BNDG PLASTER X FAST 3X3 WHT LF (CAST SUPPLIES) IMPLANT
BNDG PLSTR 9X3 FST ST WHT (CAST SUPPLIES)
BRUSH SCRUB EZ PLAIN DRY (MISCELLANEOUS) ×2 IMPLANT
CORD BIPOLAR FORCEPS 12FT (ELECTRODE) ×2 IMPLANT
COVER BACK TABLE REUSABLE LG (DRAPES) ×2 IMPLANT
COVER MAYO STAND REUSABLE (DRAPES) ×2 IMPLANT
COVER WAND RF STERILE (DRAPES) IMPLANT
CUFF TOURN SGL QUICK 18X4 (TOURNIQUET CUFF) ×1 IMPLANT
DECANTER SPIKE VIAL GLASS SM (MISCELLANEOUS) IMPLANT
DRAPE EXTREMITY T 121X128X90 (DISPOSABLE) ×2 IMPLANT
DRAPE OEC MINIVIEW 54X84 (DRAPES) ×2 IMPLANT
DRAPE SURG 17X23 STRL (DRAPES) ×2 IMPLANT
GAUZE 4X4 16PLY RFD (DISPOSABLE) IMPLANT
GAUZE SPONGE 4X4 12PLY STRL (GAUZE/BANDAGES/DRESSINGS) ×2 IMPLANT
GAUZE XEROFORM 1X8 LF (GAUZE/BANDAGES/DRESSINGS) ×2 IMPLANT
GLOVE BIO SURGEON STRL SZ 6.5 (GLOVE) ×1 IMPLANT
GLOVE BIOGEL PI IND STRL 7.0 (GLOVE) ×1 IMPLANT
GLOVE BIOGEL PI INDICATOR 7.0 (GLOVE) ×2
GLOVE ECLIPSE 7.0 STRL STRAW (GLOVE) ×2 IMPLANT
GLOVE SKINSENSE NS SZ7.5 (GLOVE) ×1
GLOVE SKINSENSE STRL SZ7.5 (GLOVE) ×1 IMPLANT
GLOVE SURG SYN 7.5  E (GLOVE) ×1
GLOVE SURG SYN 7.5 E (GLOVE) ×1 IMPLANT
GLOVE SURG SYN 7.5 PF PI (GLOVE) ×1 IMPLANT
GOWN SRG XL LVL 4 BRTHBL STRL (GOWNS) ×1 IMPLANT
GOWN STRL NON-REIN XL LVL4 (GOWNS) ×2
GOWN STRL REIN XL XLG (GOWN DISPOSABLE) ×2 IMPLANT
GOWN STRL REUS W/ TWL LRG LVL3 (GOWN DISPOSABLE) ×1 IMPLANT
GOWN STRL REUS W/ TWL XL LVL3 (GOWN DISPOSABLE) ×1 IMPLANT
GOWN STRL REUS W/TWL LRG LVL3 (GOWN DISPOSABLE) ×2
GOWN STRL REUS W/TWL XL LVL3 (GOWN DISPOSABLE) ×2
NDL HYPO 25X1 1.5 SAFETY (NEEDLE) IMPLANT
NEEDLE HYPO 25X1 1.5 SAFETY (NEEDLE) IMPLANT
NS IRRIG 1000ML POUR BTL (IV SOLUTION) ×2 IMPLANT
PACK BASIN DAY SURGERY FS (CUSTOM PROCEDURE TRAY) ×2 IMPLANT
PAD CAST 3X4 CTTN HI CHSV (CAST SUPPLIES) ×1 IMPLANT
PADDING CAST ABS 3INX4YD NS (CAST SUPPLIES)
PADDING CAST ABS 4INX4YD NS (CAST SUPPLIES) ×1
PADDING CAST ABS COTTON 3X4 (CAST SUPPLIES) IMPLANT
PADDING CAST ABS COTTON 4X4 ST (CAST SUPPLIES) ×1 IMPLANT
PADDING CAST COTTON 3X4 STRL (CAST SUPPLIES) ×2
SLEEVE SCD COMPRESS KNEE MED (MISCELLANEOUS) ×2 IMPLANT
SPLINT FIBERGLASS 3X35 (CAST SUPPLIES) ×1 IMPLANT
SPLINT UNIVERSAL RIGHT (SOFTGOODS) IMPLANT
SPLINT WRIST 10 LEFT UNV (SOFTGOODS) IMPLANT
STOCKINETTE 4X48 STRL (DRAPES) ×2 IMPLANT
SUCTION FRAZIER HANDLE 10FR (MISCELLANEOUS) ×1
SUCTION TUBE FRAZIER 10FR DISP (MISCELLANEOUS) ×1 IMPLANT
SUT ETHIBOND 2-0 V-5 NDL (SUTURE) ×1 IMPLANT
SUT ETHIBOND 2-0 V-5 NEEDLE (SUTURE) ×2 IMPLANT
SUT ETHILON 3 0 PS 1 (SUTURE) ×2 IMPLANT
SUT ETHILON 4 0 PS 2 18 (SUTURE) IMPLANT
SUT FIBERWIRE #2 38 T-5 BLUE (SUTURE)
SUT PROLENE 5 0 PS 2 (SUTURE) IMPLANT
SUT VIC AB 0 SH 27 (SUTURE) IMPLANT
SUT VIC AB 2-0 SH 27 (SUTURE) ×2
SUT VIC AB 2-0 SH 27XBRD (SUTURE) ×1 IMPLANT
SUTURE FIBERWR #2 38 T-5 BLUE (SUTURE) IMPLANT
SYR BULB 3OZ (MISCELLANEOUS) ×2 IMPLANT
SYR CONTROL 10ML LL (SYRINGE) IMPLANT
TOWEL GREEN STERILE FF (TOWEL DISPOSABLE) ×4 IMPLANT
TRAY DSU PREP LF (CUSTOM PROCEDURE TRAY) ×2 IMPLANT
TUBE CONNECTING 20X1/4 (TUBING) ×2 IMPLANT
UNDERPAD 30X36 HEAVY ABSORB (UNDERPADS AND DIAPERS) ×2 IMPLANT

## 2018-10-12 NOTE — Discharge Instructions (Signed)
Postoperative instructions:  Weightbearing instructions: as tolerated  Dressing instructions: Keep your dressing and/or splint clean and dry at all times.  It will be removed at your first post-operative appointment.  Your stitches and/or staples will be removed at this visit.  Incision instructions:  Do not soak your incision for 3 weeks after surgery.  If the incision gets wet, pat dry and do not scrub the incision.  Pain control:  You have been given a prescription to be taken as directed for post-operative pain control.  In addition, elevate the operative extremity above the heart at all times to prevent swelling and throbbing pain.  Take over-the-counter Colace, 100mg  by mouth twice a day while taking narcotic pain medications to help prevent constipation.  Follow up appointments: 1) 7 days for suture removal and wound check. 2) Dr. Erlinda Hong as scheduled.   -------------------------------------------------------------------------------------------------------------  After Surgery Pain Control:  After your surgery, post-surgical discomfort or pain is likely. This discomfort can last several days to a few weeks. At certain times of the day your discomfort may be more intense.  Did you receive a nerve block?  A nerve block can provide pain relief for one hour to two days after your surgery. As long as the nerve block is working, you will experience little or no sensation in the area the surgeon operated on.  As the nerve block wears off, you will begin to experience pain or discomfort. It is very important that you begin taking your prescribed pain medication before the nerve block fully wears off. Treating your pain at the first sign of the block wearing off will ensure your pain is better controlled and more tolerable when full-sensation returns. Do not wait until the pain is intolerable, as the medicine will be less effective. It is better to treat pain in advance than to try and catch up.    General Anesthesia:  If you did not receive a nerve block during your surgery, you will need to start taking your pain medication shortly after your surgery and should continue to do so as prescribed by your surgeon.  Pain Medication:  Most commonly we prescribe Vicodin and Percocet for post-operative pain. Both of these medications contain a combination of acetaminophen (Tylenol) and a narcotic to help control pain.   It takes between 30 and 45 minutes before pain medication starts to work. It is important to take your medication before your pain level gets too intense.   Nausea is a common side effect of many pain medications. You will want to eat something before taking your pain medicine to help prevent nausea.   If you are taking a prescription pain medication that contains acetaminophen, we recommend that you do not take additional over the counter acetaminophen (Tylenol).  Other pain relieving options:   Using a cold pack to ice the affected area a few times a day (15 to 20 minutes at a time) can help to relieve pain, reduce swelling and bruising.   Elevation of the affected area can also help to reduce pain and swelling. 3    Post Anesthesia Home Care Instructions  Activity: Get plenty of rest for the remainder of the day. A responsible individual must stay with you for 24 hours following the procedure.  For the next 24 hours, DO NOT: -Drive a car -Paediatric nurse -Drink alcoholic beverages -Take any medication unless instructed by your physician -Make any legal decisions or sign important papers.  Meals: Start with liquid foods such as  gelatin or soup. Progress to regular foods as tolerated. Avoid greasy, spicy, heavy foods. If nausea and/or vomiting occur, drink only clear liquids until the nausea and/or vomiting subsides. Call your physician if vomiting continues.  Special Instructions/Symptoms: Your throat may feel dry or sore from the anesthesia or the breathing  tube placed in your throat during surgery. If this causes discomfort, gargle with warm salt water. The discomfort should disappear within 24 hours.  If you had a scopolamine patch placed behind your ear for the management of post- operative nausea and/or vomiting:  1. The medication in the patch is effective for 72 hours, after which it should be removed.  Wrap patch in a tissue and discard in the trash. Wash hands thoroughly with soap and water. 2. You may remove the patch earlier than 72 hours if you experience unpleasant side effects which may include dry mouth, dizziness or visual disturbances. 3. Avoid touching the patch. Wash your hands with soap and water after contact with the patch.    Regional Anesthesia Blocks  1. Numbness or the inability to move the "blocked" extremity may last from 3-48 hours after placement. The length of time depends on the medication injected and your individual response to the medication. If the numbness is not going away after 48 hours, call your surgeon.  2. The extremity that is blocked will need to be protected until the numbness is gone and the  Strength has returned. Because you cannot feel it, you will need to take extra care to avoid injury. Because it may be weak, you may have difficulty moving it or using it. You may not know what position it is in without looking at it while the block is in effect.  3. For blocks in the legs and feet, returning to weight bearing and walking needs to be done carefully. You will need to wait until the numbness is entirely gone and the strength has returned. You should be able to move your leg and foot normally before you try and bear weight or walk. You will need someone to be with you when you first try to ensure you do not fall and possibly risk injury.  4. Bruising and tenderness at the needle site are common side effects and will resolve in a few days.  5. Persistent numbness or new problems with movement should be  communicated to the surgeon or the [facility] [phone] [place] [phone]).    YOU RECEIVED OXYCODONE 5 MG BY MOUTH AT 10;30 AM may repeat pain med in 4-6 hours

## 2018-10-12 NOTE — Progress Notes (Signed)
Assisted Dr. Oddono with left, ultrasound guided, axillary block. Side rails up, monitors on throughout procedure. See vital signs in flow sheet. Tolerated Procedure well. 

## 2018-10-12 NOTE — Anesthesia Postprocedure Evaluation (Signed)
Anesthesia Post Note  Patient: Roy Moore  Procedure(s) Performed: TENDON SHEATH RELEASE/TENOLYSIS (Left Wrist)     Patient location during evaluation: PACU Anesthesia Type: General Level of consciousness: awake and alert Pain management: pain level controlled Vital Signs Assessment: post-procedure vital signs reviewed and stable Respiratory status: spontaneous breathing, nonlabored ventilation, respiratory function stable and patient connected to nasal cannula oxygen Cardiovascular status: blood pressure returned to baseline and stable Postop Assessment: no apparent nausea or vomiting Anesthetic complications: no    Last Vitals:  Vitals:   10/12/18 0945 10/12/18 1015  BP: (!) 157/74 (!) 167/74  Pulse: 67 74  Resp: 19 16  Temp: (!) 36.4 C 36.5 C  SpO2: 100% 97%    Last Pain:  Vitals:   10/12/18 1015  TempSrc:   PainSc: 0-No pain                 Ugo Thoma

## 2018-10-12 NOTE — Telephone Encounter (Signed)
Controlled substance database (PDMP) reviewed. No concerns appreciated.  Last filled alprazolam 09/10/18.   Refill ordered for 1 month - please reschedule appt during that time - ok in few weeks as surgery today

## 2018-10-12 NOTE — H&P (Signed)
PREOPERATIVE H&P  Chief Complaint: left wrist extensor tenosynovitis  HPI: Roy Moore is a 63 y.o. male who presents for surgical treatment of left wrist extensor tenosynovitis.  He denies any changes in medical history.  Past Medical History:  Diagnosis Date  . Anxiety   . Hyperlipidemia   . Peripheral vascular disease ([place])   . Stroke [facility]) 2009   no residual   Past Surgical History:  Procedure Laterality Date  . ABDOMINAL AORTOGRAM W/LOWER EXTREMITY [name] 06/09/2016   Procedure: Abdominal Aortogram w/Lower Extremity;  Surgeon: Serafina Mitchell, MD;  Location: [facility];  Service: Cardiovascular;  Laterality: [name];  . BACK SURGERY     10 yrs ago or so-fusion in neck   . CERVICAL FUSION    . COLONOSCOPY     15-20 yrs ago-normal per pt.   . COLONOSCOPY W/ POLYPECTOMY    . CYSTOSCOPY     kidney stools removed   . FEMORAL-POPLITEAL BYPASS GRAFT Right 07/24/2016   Procedure: RIGHT FEMORAL TO ABOVE KNEE POPLITEAL ARTERY BYPASS GRAFT;  Surgeon: Serafina Mitchell, MD;  Location: [facility];  Service: Vascular;  Laterality: Right;  . LUMBAR LAMINECTOMY/DECOMPRESSION MICRODISCECTOMY Bilateral 12/03/2015   Procedure: Microdiscectomy - bilateral - Lumbar four-lumbar five;  Surgeon: Earnie Larsson, MD;  Location: [facility];  Service: Neurosurgery;  Laterality: Bilateral;  . PERIPHERAL VASCULAR INTERVENTION Right 06/09/2016   Procedure: Peripheral Vascular Intervention;  Surgeon: Serafina Mitchell, MD;  Location: [facility];  Service: Cardiovascular;  Laterality: Right;  SFA  . teeth removal     Social History   Socioeconomic History  . Marital status: Married    Spouse name: Not on file  . Number of children: Not on file  . Years of education: Not on file  . Highest education level: Not on file  Occupational History  . Not on file  Social Needs  . Financial resource strain: Not on file  . Food insecurity    Worry: Not on file    Inability: Not on file  . Transportation  needs    Medical: Not on file    Non-medical: Not on file  Tobacco Use  . Smoking status: Current Every Day Smoker    Packs/day: 0.50    Years: 47.00    Pack years: 23.50    Types: Cigarettes  . Smokeless tobacco: Never Used  . Tobacco comment: advised to hold for 24 hours prior  Substance and Sexual Activity  . Alcohol use: No    Alcohol/week: 0.0 standard drinks    Comment: no alcohol in 23 years (12/02/15  . Drug use: No  . Sexual activity: Not on file  Lifestyle  . Physical activity    Days per week: Not on file    Minutes per session: Not on file  . Stress: Not on file  Relationships  . Social Herbalist on phone: Not on file    Gets together: Not on file    Attends religious service: Not on file    Active member of club or organization: Not on file    Attends meetings of clubs or organizations: Not on file    Relationship status: Not on file  Other Topics Concern  . Not on file  Social History Narrative   Wife is bedridden and he is primary caregiver---no family.   Family History  Problem Relation Age of Onset  . Colon cancer Neg Hx   . Rectal cancer Neg  Hx   . Stomach cancer Neg Hx   . Esophageal cancer Neg Hx    Allergies  Allergen Reactions  . No Known Allergies    Prior to Admission medications   Medication Sig Start Date End Date Taking? Authorizing Provider  ALPRAZolam (XANAX) 1 MG tablet TAKE 1/2 TO 1 (ONE-HALF TO ONE) TABLET BY MOUTH TWICE DAILY AS NEEDED FOR ANXIETY 09/09/18  Yes [name], MD  amitriptyline (ELAVIL) 50 MG tablet Take 1 tablet (50 mg total) by mouth at bedtime. 01/10/18  Yes [name], MD  amLODipine (NORVASC) 5 MG tablet Take 1 tablet (5 mg total) by mouth daily. 07/13/18  Yes [name], MD  clopidogrel (PLAVIX) 75 MG tablet Take 1 tablet (75 mg total) by mouth daily. 07/13/18  Yes [name], MD  lovastatin (MEVACOR) 20 MG tablet Take 1 tablet (20 mg total) by mouth at bedtime. 07/13/18  Yes  [name], MD  oxyCODONE-acetaminophen (PERCOCET/ROXICET) 5-325 MG tablet Take 1 tablet by mouth every 6 (six) hours as needed for moderate pain. 07/26/16   Rhyne, Hulen Shouts, PA-C  sertraline (ZOLOFT) 25 MG tablet Take 1 tablet (25 mg total) by mouth daily. 07/13/18   [name], MD     Positive ROS: All other systems have been reviewed and were otherwise negative with the exception of those mentioned in the HPI and as above.  Physical Exam: General: Alert, no acute distress Cardiovascular: No pedal edema Respiratory: No cyanosis, no use of accessory musculature GI: abdomen soft Skin: No lesions in the area of chief complaint Neurologic: Sensation intact distally Psychiatric: Patient is competent for consent with normal mood and affect Lymphatic: no lymphedema  MUSCULOSKELETAL: exam stable  Assessment: left wrist extensor tenosynovitis  Plan: Plan for Procedure(s): TENDON SHEATH RELEASE/TENOLYSIS  The risks benefits and alternatives were discussed with the patient including but not limited to the risks of nonoperative treatment, versus surgical intervention including infection, bleeding, nerve injury,  blood clots, cardiopulmonary complications, morbidity, mortality, among others, and they were willing to proceed.   Eduard Roux, MD   10/12/2018 8:11 AM

## 2018-10-12 NOTE — Telephone Encounter (Signed)
Pt states he is having surgery and need to reschedule appointment, he would like to know could you five him a refill on his medication for 39 days until he can get an appointment? Please advise

## 2018-10-12 NOTE — Anesthesia Procedure Notes (Signed)
Anesthesia Regional Block: Axillary brachial plexus block   Pre-Anesthetic Checklist: ,, timeout performed, Correct Patient, Correct Site, Correct Laterality, Correct Procedure, Correct Position, site marked, Risks and benefits discussed,  Surgical consent,  Pre-op evaluation,  At surgeon's request and post-op pain management  Laterality: Left  Prep: chloraprep       Needles:  Injection technique: Single-shot  Needle Type: Echogenic Stimulator Needle     Needle Length: 5cm  Needle Gauge: 22     Additional Needles:   Procedures:, nerve stimulator,,, ultrasound used (permanent image in chart),,,,   Nerve Stimulator or Paresthesia:  Response: hand, 0.45 mA,   Additional Responses:   Narrative:  Start time: 10/12/2018 8:09 AM End time: 10/12/2018 8:15 AM Injection made incrementally with aspirations every 5 mL.  Performed by: Personally  Anesthesiologist: Janeece Riggers, MD  Additional Notes: Functioning IV was confirmed and monitors were applied.  A 32mm 22ga Arrow echogenic stimulator needle was used. Sterile prep and drape,hand hygiene and sterile gloves were used. Ultrasound guidance: relevant anatomy identified, needle position confirmed, local anesthetic spread visualized around nerve(s)., vascular puncture avoided.  Image printed for medical record. Negative aspiration and negative test dose prior to incremental administration of local anesthetic. The patient tolerated the procedure well.

## 2018-10-12 NOTE — Transfer of Care (Signed)
Immediate Anesthesia Transfer of Care Note  Patient: Roy Moore  Procedure(s) Performed: TENDON SHEATH RELEASE/TENOLYSIS (Left Wrist)  Patient Location: PACU  Anesthesia Type:GA combined with regional for post-op pain  Level of Consciousness: sedated  Airway & Oxygen Therapy: Patient Spontanous Breathing and Patient connected to nasal cannula oxygen  Post-op Assessment: Report given to RN and Post -op Vital signs reviewed and stable  Post vital signs: Reviewed and stable  Last Vitals:  Vitals Value Taken Time  BP 131/58 10/12/18 0920  Temp    Pulse 57 10/12/18 0923  Resp 15 10/12/18 0923  SpO2 100 % 10/12/18 0923  Vitals shown include unvalidated device data.  Last Pain:  Vitals:   10/12/18 0705  TempSrc: Oral  PainSc: 7       Patients Stated Pain Goal: 7 ([id number])  Complications: No apparent anesthesia complications

## 2018-10-12 NOTE — Telephone Encounter (Signed)
Pt calling to find out what is going on with his medication.  States that is should have been refilled yesterday because he could have it on 10/11/2018.  Pt states that he has just had surgery and his sister is his transportation and will only be around for today.  Pt states that he is completely out of medication.  Pt would like a call back to let him know when this has been sent in.

## 2018-10-12 NOTE — Op Note (Signed)
   Date of Surgery: 10/12/2018  INDICATIONS: Roy Moore is a 63 y.o.-year-old male with presumed rheumatoid arthritis and left wrist extensor tenosynovitis;  The patient did consent to the procedure after discussion of the risks and benefits.  PREOPERATIVE DIAGNOSIS:  1.  Extensor tenosynovitis of the fourth compartment of the left wrist  POSTOPERATIVE DIAGNOSIS: Same.  PROCEDURE: Tenolysis of the left dorsal wrist fourth compartment x 5 tendons  SURGEON: [name], M.D.  ASSIST: Ciro Backer [place], [place]; necessary for the timely completion of procedure and due to complexity of procedure.  ANESTHESIA:  general, axillary block  IV FLUIDS AND URINE: See anesthesia.  ESTIMATED BLOOD LOSS: Minimal mL.  IMPLANTS: None  DRAINS: None  COMPLICATIONS: see description of procedure.  DESCRIPTION OF PROCEDURE: The patient was brought to the operating room and placed supine on the operating table.  The patient had been signed prior to the procedure and this was documented. The patient had the anesthesia placed by the anesthesiologist.  A time-out was performed to confirm that this was the correct patient, site, side and location. The patient did receive antibiotics prior to the incision and was re-dosed during the procedure as needed at indicated intervals.  A tourniquet was placed on the upper left arm.  The patient had the operative extremity prepped and draped in the standard surgical fashion.    An incision was made directly over the fourth compartment.  Dissection was carried down through the subcutaneous tissue.  Skin hooks were placed for visualization.  Dissection was continued down onto the proliferative tenosynovium.  The extensor retinaculum was released with tenotomy.  He had extensive inflamed tenosynovitis adherent to the extensor tendons of the fourth compartment.  The bulk of it was carefully dissected off with tenotomy scissors.  This was sent to pathology for permanent.  We  then performed tenolysis of each extensor tendon with tenotomy scissors along the full extent of the tendons of the fourth compartment.  Altogether tenolysis was performed on 5 extensor tendons.  After this was done the surgical wound was thoroughly irrigated.  The tourniquet was deflated and hemostasis was obtained.  The skin was closed with interrupted 4-0 nylon sutures.  Sterile dressings were applied.  A volar splint was placed for immobilization.  Patient tolerated the procedure well and had no immediate complications.  POSTOPERATIVE PLAN: Discharge home and follow-up in 1 week for wound check.  Azucena Cecil, MD 9:03 AM

## 2018-10-12 NOTE — Telephone Encounter (Signed)
Requested medication (s) are due for refill today: yes  Requested medication (s) are on the active medication list: yes  Last refill:  09/10/2018  Future visit scheduled: no  Notes to clinic:  Refill cannot be delegated    Requested Prescriptions  Pending Prescriptions Disp Refills   ALPRAZolam (XANAX) 1 MG tablet [Pharmacy Med Name: ALPRAZolam 1 MG Oral Tablet] 60 tablet 0    Sig: TAKE 1/2 TO 1 (ONE-HALF TO ONE) TABLET BY MOUTH TWICE DAILY AS NEEDED FOR ANXIETY     Not Delegated - Psychiatry:  Anxiolytics/Hypnotics Failed - 10/12/2018 11:30 AM      Failed - This refill cannot be delegated      Failed - Urine Drug Screen completed in last 360 days.      Passed - Valid encounter within last 6 months    Recent Outpatient Visits          3 months ago Generalized anxiety disorder   [facility], Ranell Patrick, MD   4 months ago Ganglion cyst of dorsum of left wrist   [facility], Ines Bloomer, MD   9 months ago Essential hypertension   [facility], Ranell Patrick, MD   1 year ago Elevated alkaline phosphatase level   [facility], Ranell Patrick, MD   1 year ago Elevated alkaline phosphatase level   [facility], Ranell Patrick, MD      Future Appointments            In 1 week Leandrew Koyanagi, MD [facility]

## 2018-10-12 NOTE — Anesthesia Procedure Notes (Signed)
Procedure Name: LMA Insertion Date/Time: 10/12/2018 8:25 AM Performed by: Maryella Shivers, CRNA Pre-anesthesia Checklist: Patient identified, Emergency Drugs available, Suction available and Patient being monitored Patient Re-evaluated:Patient Re-evaluated prior to induction Oxygen Delivery Method: Circle system utilized Preoxygenation: Pre-oxygenation with 100% oxygen Induction Type: IV induction Ventilation: Mask ventilation without difficulty LMA: LMA inserted LMA Size: 5.0 Number of attempts: 1 Airway Equipment and Method: Bite block Placement Confirmation: positive ETCO2 Tube secured with: Tape Dental Injury: Teeth and Oropharynx as per pre-operative assessment

## 2018-10-12 NOTE — Telephone Encounter (Signed)
Requested Prescriptions   Pending Prescriptions Disp Refills  . ALPRAZolam (XANAX) 1 MG tablet [Pharmacy Med Name: ALPRAZolam 1 MG Oral Tablet] 60 tablet 0    Sig: TAKE 1/2 TO 1 (ONE-HALF TO ONE) TABLET BY MOUTH TWICE DAILY AS NEEDED FOR ANXIETY    Last OV 07/13/2018  Last written 09/09/2018

## 2018-10-13 ENCOUNTER — Telehealth: Payer: Self-pay

## 2018-10-13 ENCOUNTER — Encounter (HOSPITAL_BASED_OUTPATIENT_CLINIC_OR_DEPARTMENT_OTHER): Payer: Self-pay | Admitting: Orthopaedic Surgery

## 2018-10-13 ENCOUNTER — Telehealth: Payer: Self-pay | Admitting: Family Medicine

## 2018-10-13 LAB — SURGICAL PATHOLOGY

## 2018-10-13 NOTE — Telephone Encounter (Signed)
correct 

## 2018-10-13 NOTE — Telephone Encounter (Signed)
I left voicemail for patient advising. 

## 2018-10-13 NOTE — Telephone Encounter (Signed)
Patient called wanting to know if he should keep his bandage on until his visit on 10/19/2018?  Patient had left wrist surgery on 10/12/2018.  CB# is [phone].  Please advise.  Thank you.

## 2018-10-13 NOTE — Telephone Encounter (Signed)
Patient was called and rescheduled on 10/13/18 for 10/20/18 at 2:00pm ,with Dr Nyoka Cowden

## 2018-10-13 NOTE — Telephone Encounter (Signed)
Please advise. You do not want patient to remove prior to visit, correct?

## 2018-10-19 ENCOUNTER — Ambulatory Visit (INDEPENDENT_AMBULATORY_CARE_PROVIDER_SITE_OTHER): Payer: Medicare HMO | Admitting: Physician Assistant

## 2018-10-19 ENCOUNTER — Encounter: Payer: Self-pay | Admitting: Orthopaedic Surgery

## 2018-10-19 VITALS — Ht 69.0 in | Wt 145.0 lb

## 2018-10-19 DIAGNOSIS — M65832 Other synovitis and tenosynovitis, left forearm: Secondary | ICD-10-CM | POA: Diagnosis not present

## 2018-10-19 MED ORDER — HYDROCODONE-ACETAMINOPHEN 5-325 MG PO TABS: 1.0000 | ORAL_TABLET | Freq: Four times a day (QID) | ORAL | 0 refills | Status: DC | PRN

## 2018-10-19 NOTE — Addendum Note (Signed)
Addended by: Meyer Cory on: 10/19/2018 05:11 PM   Modules accepted: Orders

## 2018-10-19 NOTE — Progress Notes (Signed)
Post-Op Visit Note   Patient: Roy Moore           Date of Birth: May 06, 1955           MRN: [id number] Visit Date: 10/19/2018 PCP: [name], MD   Assessment & Plan:  Chief Complaint:  Chief Complaint  Patient presents with  . Left Wrist - Routine Post Op    10/12/2018 Tendon Sheath Release/Tenolysis Left Wrist   Visit Diagnoses:  1. Extensor tenosynovitis of left wrist     Plan: Patient is a pleasant 63 year old gentleman who presents our clinic today 1 week status post extensor tenolysis left wrist, date of surgery 10/12/2018.  Surgical pathology came back without any worrisome features.  The patient has been doing well.  No fevers or chills.  He has moderate pain to the dorsum of the wrist.  He has been taking Norco for this.  Examination of his left wrist reveals well-healing surgical incision with nylon sutures in place.  No evidence of infection or cellulitis.  Mild swelling.  We will place him in a new bandage and apply removable wrist splint.  He will not lift anything heavy or submerge his hand in water for another 3 weeks.  Follow-up with [place] in 1 week's time for suture removal.  I will refill his Norco today.  We will also go ahead and refer him to rheumatology as his rheumatoid factor and inflammatory markers were elevated on previous blood work.  Follow-Up Instructions: Return in about 1 week (around 10/26/2018).   Orders:  No orders of the defined types were placed in this encounter.  Meds ordered this encounter  Medications  . HYDROcodone-acetaminophen (NORCO) 5-325 MG tablet    Sig: Take 1 tablet by mouth every 6 (six) hours as needed for moderate pain.    Dispense:  20 tablet    Refill:  0    Imaging: No new imaging  PMFS History: Patient Active Problem List   Diagnosis Date Noted  . Extensor tenosynovitis of left wrist 09/20/2018  . PAD (peripheral artery disease) ([place]) 07/24/2016  . Peripheral arterial disease ([place]) 07/17/2016  . Lumbar  stenosis with neurogenic claudication 12/03/2015  . CVA, old, hemiparesis ([place]) 03/24/2011  . ANXIETY DISORDER 10/04/2007  . TOBACCO ABUSE 09/30/2007  . GERD 09/30/2007  . DYSLIPIDEMIA 09/15/2007  . STROKE WITH LEFT HEMIPARESIS 09/15/2007   Past Medical History:  Diagnosis Date  . Anxiety   . Hyperlipidemia   . Peripheral vascular disease ([place])   . Stroke [facility]) 2009   no residual    Family History  Problem Relation Age of Onset  . Colon cancer Neg Hx   . Rectal cancer Neg Hx   . Stomach cancer Neg Hx   . Esophageal cancer Neg Hx     Past Surgical History:  Procedure Laterality Date  . ABDOMINAL AORTOGRAM W/LOWER EXTREMITY [name] 06/09/2016   Procedure: Abdominal Aortogram w/Lower Extremity;  Surgeon: Serafina Mitchell, MD;  Location: [facility];  Service: Cardiovascular;  Laterality: [name];  . BACK SURGERY     10 yrs ago or so-fusion in neck   . CERVICAL FUSION    . COLONOSCOPY     15-20 yrs ago-normal per pt.   . COLONOSCOPY W/ POLYPECTOMY    . CYSTOSCOPY     kidney stools removed   . FEMORAL-POPLITEAL BYPASS GRAFT Right 07/24/2016   Procedure: RIGHT FEMORAL TO ABOVE KNEE POPLITEAL ARTERY BYPASS GRAFT;  Surgeon: Serafina Mitchell, MD;  [facility];  Service: Vascular;  Laterality: Right;  . LUMBAR LAMINECTOMY/DECOMPRESSION MICRODISCECTOMY Bilateral 12/03/2015   Procedure: Microdiscectomy - bilateral - Lumbar four-lumbar five;  Surgeon: Earnie Larsson, MD;  Location: [facility];  Service: Neurosurgery;  Laterality: Bilateral;  . PERIPHERAL VASCULAR INTERVENTION Right 06/09/2016   Procedure: Peripheral Vascular Intervention;  Surgeon: Serafina Mitchell, MD;  Location: [facility];  Service: Cardiovascular;  Laterality: Right;  SFA  . REPAIR EXTENSOR TENDON Left 10/12/2018   Procedure: TENDON SHEATH RELEASE/TENOLYSIS;  Surgeon: Leandrew Koyanagi, MD;  Location: [facility];  Service: Orthopedics;  Laterality: Left;  . teeth removal     Social History   Occupational  History  . Not on file  Tobacco Use  . Smoking status: Current Every Day Smoker    Packs/day: 0.50    Years: 47.00    Pack years: 23.50    Types: Cigarettes  . Smokeless tobacco: Never Used  . Tobacco comment: advised to hold for 24 hours prior  Substance and Sexual Activity  . Alcohol use: No    Alcohol/week: 0.0 standard drinks    Comment: no alcohol in 23 years (12/02/15  . Drug use: No  . Sexual activity: Not on file

## 2018-10-20 ENCOUNTER — Ambulatory Visit (INDEPENDENT_AMBULATORY_CARE_PROVIDER_SITE_OTHER): Payer: Medicare HMO | Admitting: Family Medicine

## 2018-10-20 ENCOUNTER — Other Ambulatory Visit: Payer: Self-pay

## 2018-10-20 ENCOUNTER — Encounter: Payer: Self-pay | Admitting: Family Medicine

## 2018-10-20 VITALS — BP 180/76 | HR 82 | Temp 98.2°F | Wt 142.0 lb

## 2018-10-20 DIAGNOSIS — I10 Essential (primary) hypertension: Secondary | ICD-10-CM

## 2018-10-20 DIAGNOSIS — F411 Generalized anxiety disorder: Secondary | ICD-10-CM | POA: Diagnosis not present

## 2018-10-20 DIAGNOSIS — J3489 Other specified disorders of nose and nasal sinuses: Secondary | ICD-10-CM

## 2018-10-20 DIAGNOSIS — J329 Chronic sinusitis, unspecified: Secondary | ICD-10-CM

## 2018-10-20 DIAGNOSIS — R69 Illness, unspecified: Secondary | ICD-10-CM | POA: Diagnosis not present

## 2018-10-20 MED ORDER — FLUTICASONE PROPIONATE 50 MCG/ACT NA SUSP: 2.0000 | Freq: Every day | NASAL | 6 refills | Status: DC

## 2018-10-20 MED ORDER — IPRATROPIUM BROMIDE 0.06 % NA SOLN: 1.0000 | Freq: Four times a day (QID) | NASAL | 5 refills | Status: DC

## 2018-10-20 NOTE — Progress Notes (Signed)
Subjective:    Patient ID: Roy Moore, male    DOB: November 18, 1955, 63 y.o.   MRN: [id number]  HPI  Roy Moore is a 63 y.o. male Presents today for: Chief Complaint  Patient presents with  . Anxiety    F/u for anxiety Gad7=     Patient stated his axiety feels like its a little worse since going through surgery and dealing with issues with his wife not getting the care he needed   Generalized Anxiety:  Last discussed in June.  Persistent need for Xanax at that time, have discussed use of SSRI to minimize/lessen the need for benzodiazepine.  Agreed to start Zoloft 25 mg with morning records to see if it provided more benefit.  Cautioned on serotonin with current use of amitriptyline.  He was continued on Xanax 1 mg twice daily.  Wants to stay on same med - just xanax.  No change in anxiety on Zoloft - took for about 3 months. Stopped as didn't Concerns about taking care of his wife, his own health issues.  Has a lot on his plate right now. Elavil is working for sleep.  Refuses to meet with counselor/therapist.  Last filled Xanax 9/17. #60.    GAD 7 : Generalized Anxiety Score 10/20/2018  Nervous, Anxious, on Edge 3  Control/stop worrying 3  Worry too much - different things 3  Trouble relaxing 3  Restless 2  Easily annoyed or irritable 3  Afraid - awful might happen 3  Total GAD 7 Score 20  Anxiety Difficulty Not difficult at all   Depression screen [facility] 2/9 10/20/2018 10/20/2018 07/13/2018 06/13/2018 01/10/2018  Decreased Interest 0 0 0 0 3  Down, Depressed, Hopeless 0 0 0 0 1  PHQ - 2 Score 0 0 0 0 4  Altered sleeping - - - - 2  Tired, decreased energy - - - - 3  Change in appetite - - - - 0  Feeling bad or failure about yourself  - - - - 1  Trouble concentrating - - - - 0  Moving slowly or fidgety/restless - - - - 3  Suicidal thoughts - - - - 0  PHQ-9 Score - - - - 13  Difficult doing work/chores - - - - Not difficult at all   Sinus congestion: Few months -  sneezing, runny nose, eye itchy/watery at times.  No discolored phlegm. Occasional HA - neck muscle/tension with stress at home. Sinuses congested.  No fever.  9/12 - negative Covid testing.   Hypertension: BP Readings from Last 3 Encounters:  10/20/18 (!) 172/61  10/12/18 (!) 167/74  07/13/18 120/68   Lab Results  Component Value Date   CREATININE 1.37 (H) 07/13/2018  no alcohol. No IDU.  No missed doses of amlodipine.  Some chronic pain with shoulders - followed by ortho. Recent left tendon release for extensor tenosynovitis. Plans to refer to other provider for arthritis.  No chest pain, dyspnea. No new weakness or HA.   Patient Active Problem List   Diagnosis Date Noted  . Extensor tenosynovitis of left wrist 09/20/2018  . PAD (peripheral artery disease) ([place]) 07/24/2016  . Peripheral arterial disease ([place]) 07/17/2016  . Lumbar stenosis with neurogenic claudication 12/03/2015  . CVA, old, hemiparesis ([place]) 03/24/2011  . ANXIETY DISORDER 10/04/2007  . TOBACCO ABUSE 09/30/2007  . GERD 09/30/2007  . DYSLIPIDEMIA 09/15/2007  . STROKE WITH LEFT HEMIPARESIS 09/15/2007   Past Medical History:  Diagnosis Date  . Anxiety   .  Hyperlipidemia   . Peripheral vascular disease ([place])   . Stroke [facility]) 2009   no residual   Past Surgical History:  Procedure Laterality Date  . ABDOMINAL AORTOGRAM W/LOWER EXTREMITY [name] 06/09/2016   Procedure: Abdominal Aortogram w/Lower Extremity;  Surgeon: Serafina Mitchell, MD;  Location: [facility];  Service: Cardiovascular;  Laterality: [name];  . BACK SURGERY     10 yrs ago or so-fusion in neck   . CERVICAL FUSION    . COLONOSCOPY     15-20 yrs ago-normal per pt.   . COLONOSCOPY W/ POLYPECTOMY    . CYSTOSCOPY     kidney stools removed   . FEMORAL-POPLITEAL BYPASS GRAFT Right 07/24/2016   Procedure: RIGHT FEMORAL TO ABOVE KNEE POPLITEAL ARTERY BYPASS GRAFT;  Surgeon: Serafina Mitchell, MD;  Location: [facility];  Service: Vascular;  Laterality:  Right;  . LUMBAR LAMINECTOMY/DECOMPRESSION MICRODISCECTOMY Bilateral 12/03/2015   Procedure: Microdiscectomy - bilateral - Lumbar four-lumbar five;  Surgeon: Earnie Larsson, MD;  Location: [facility];  Service: Neurosurgery;  Laterality: Bilateral;  . PERIPHERAL VASCULAR INTERVENTION Right 06/09/2016   Procedure: Peripheral Vascular Intervention;  Surgeon: Serafina Mitchell, MD;  Location: [facility];  Service: Cardiovascular;  Laterality: Right;  SFA  . REPAIR EXTENSOR TENDON Left 10/12/2018   Procedure: TENDON SHEATH RELEASE/TENOLYSIS;  Surgeon: Leandrew Koyanagi, MD;  Location: [facility];  Service: Orthopedics;  Laterality: Left;  . teeth removal     Allergies  Allergen Reactions  . No Known Allergies    Prior to Admission medications   Medication Sig Start Date End Date Taking? Authorizing Provider  ALPRAZolam (XANAX) 1 MG tablet TAKE 1/2 TO 1 (ONE-HALF TO ONE) TABLET BY MOUTH TWICE DAILY AS NEEDED FOR ANXIETY 10/12/18  Yes [name], MD  amitriptyline (ELAVIL) 50 MG tablet Take 1 tablet (50 mg total) by mouth at bedtime. 01/10/18  Yes [name], MD  amLODipine (NORVASC) 5 MG tablet Take 1 tablet (5 mg total) by mouth daily. 07/13/18  Yes [name], MD  clopidogrel (PLAVIX) 75 MG tablet Take 1 tablet (75 mg total) by mouth daily. 07/13/18  Yes [name], MD  HYDROcodone-acetaminophen (NORCO) 5-325 MG tablet Take 1 tablet by mouth every 6 (six) hours as needed for moderate pain. 10/19/18  Yes Aundra Dubin, PA-C  lovastatin (MEVACOR) 20 MG tablet Take 1 tablet (20 mg total) by mouth at bedtime. 07/13/18  Yes [name], MD  ondansetron (ZOFRAN) 4 MG tablet Take 1-2 tablets (4-8 mg total) by mouth every 8 (eight) hours as needed for nausea or vomiting. 10/12/18  Yes Leandrew Koyanagi, MD  oxyCODONE-acetaminophen (PERCOCET/ROXICET) 5-325 MG tablet Take 1 tablet by mouth every 6 (six) hours as needed for moderate pain. 07/26/16  Yes Rhyne, Samantha J, PA-C   sertraline (ZOLOFT) 25 MG tablet Take 1 tablet (25 mg total) by mouth daily. 07/13/18  Yes [name], MD   Social History   Socioeconomic History  . Marital status: Married    Spouse name: Not on file  . Number of children: Not on file  . Years of education: Not on file  . Highest education level: Not on file  Occupational History  . Not on file  Social Needs  . Financial resource strain: Not on file  . Food insecurity    Worry: Not on file    Inability: Not on file  . Transportation needs    Medical: Not on file  Non-medical: Not on file  Tobacco Use  . Smoking status: Current Every Day Smoker    Packs/day: 0.50    Years: 47.00    Pack years: 23.50    Types: Cigarettes  . Smokeless tobacco: Never Used  . Tobacco comment: advised to hold for 24 hours prior  Substance and Sexual Activity  . Alcohol use: No    Alcohol/week: 0.0 standard drinks    Comment: no alcohol in 23 years (12/02/15  . Drug use: No  . Sexual activity: Not on file  Lifestyle  . Physical activity    Days per week: Not on file    Minutes per session: Not on file  . Stress: Not on file  Relationships  . Social Herbalist on phone: Not on file    Gets together: Not on file    Attends religious service: Not on file    Active member of club or organization: Not on file    Attends meetings of clubs or organizations: Not on file    Relationship status: Not on file  . Intimate partner violence    Fear of current or ex partner: Not on file    Emotionally abused: Not on file    Physically abused: Not on file    Forced sexual activity: Not on file  Other Topics Concern  . Not on file  Social History Narrative   Wife is bedridden and he is primary caregiver---no family.    Review of Systems Per HPI     Objective:   Physical Exam Vitals signs reviewed.  Constitutional:      Appearance: He is well-developed.  HENT:     Head: Normocephalic and atraumatic.  Eyes:     Pupils:  Pupils are equal, round, and reactive to light.  Neck:     Vascular: No carotid bruit or JVD.  Cardiovascular:     Rate and Rhythm: Normal rate and regular rhythm.     Heart sounds: Normal heart sounds. No murmur.  Pulmonary:     Effort: Pulmonary effort is normal.     Breath sounds: Normal breath sounds. No rales.  Skin:    General: Skin is warm and dry.  Neurological:     Mental Status: He is alert and oriented to person, place, and time.  Psychiatric:        Attention and Perception: Attention normal.        Mood and Affect: Mood is anxious. Affect is labile.        Speech: Speech normal.        Behavior: Behavior is agitated (initially, then calmed during visit. ). Behavior is cooperative.        Thought Content: Thought content does not include suicidal ideation.    Vitals:   10/20/18 1350 10/20/18 1354  BP: (!) 172/68 (!) 172/61  Pulse: 82   Temp: 98.2 F (36.8 C)   TempSrc: Oral   SpO2: 98%   Weight: 142 lb (64.4 kg)         Assessment & Plan:   Roy Moore is a 63 y.o. male Generalized anxiety disorder - Plan: Ambulatory referral to Psychiatry  -Unfortunately did not have significant change in symptoms with trial of SSRI.  Planned on cautious increase with Elavil.  He is not interested in trying SSRI again at this time and would like to remain on just alprazolam.  Counseling was also declined.  Potential side effects and risks of benzodiazepines were discussed, but  will continue same for now, refer to psychiatry to evaluate for other treatment options.  He was in agreement with this plan.  Essential hypertension   -Decreased control, potential pain component with recent surgery and other orthopedic issues.  Will increase amlodipine to 7.5 mg for now with follow-up in next 1 month.  Chronic sinusitis, unspecified location - Plan: fluticasone (FLONASE) 50 MCG/ACT nasal spray, ipratropium (ATROVENT) 0.06 % nasal spray Rhinorrhea - Plan: fluticasone (FLONASE) 50  MCG/ACT nasal spray, ipratropium (ATROVENT) 0.06 % nasal spray  -Unlikely acute bacterial sinusitis without discolored nasal discharge and based on timing of symptoms.  Antibiotics deferred at this time.  Suspect chronic sinusitis with allergic rhinitis as likely contributor.    -Trial  Flonase, and then if not improving amount of rhinorrhea, Atrovent nasal spray as option but potential overdrying discussed.  RTC precautions.   Meds ordered this encounter  Medications  . fluticasone (FLONASE) 50 MCG/ACT nasal spray    Sig: Place 2 sprays into both nostrils daily.    Dispense:  16 g    Refill:  6  . ipratropium (ATROVENT) 0.06 % nasal spray    Sig: Place 1-2 sprays into both nostrils 4 (four) times daily. As needed for nasal congestion    Dispense:  15 mL    Refill:  5   Patient Instructions   Increase amlodipine to 1 and 1/2 pills per day for blood pressure.  Recheck in next 1 month for blood pressure.   I will continue Xanax for now, but I will refer you to psychiatry to discuss other anxiety med options as Zoloft did not help. I do recommend meeting with a therapist. Let me know if you change your mind and will provide some numbers. Ok to continue elavil at night for sleep.   Try the flucitcasone nasal spray OR atrovent nasal spray for allergies and nasal congestion. Start with flonase. Return to the clinic or go to the nearest emergency room if any of your symptoms worsen or new symptoms occur.    If you have lab work done today you will be contacted with your lab results within the next 2 weeks.  If you have not heard from [place] then please contact us. The fastest way to get your results is to register for My Chart.   IF you received an x-ray today, you will receive an invoice from [facility]. Please contact [facility] at [phone] with questions or concerns regarding your invoice.   IF you received labwork today, you will receive an invoice from [place]. Please  contact [facility] at [phone] with questions or concerns regarding your invoice.   Our billing staff will not be able to assist you with questions regarding bills from these companies.  You will be contacted with the lab results as soon as they are available. The fastest way to get your results is to activate your My Chart account. Instructions are located on the last page of this paperwork. If you have not heard from [place] regarding the results in 2 weeks, please contact this office.       Signed,   [name], MD [facility].  10/20/18 7:12 PM

## 2018-10-20 NOTE — Patient Instructions (Addendum)
Increase amlodipine to 1 and 1/2 pills per day for blood pressure.  Recheck in next 1 month for blood pressure.   I will continue Xanax for now, but I will refer you to psychiatry to discuss other anxiety med options as Zoloft did not help. I do recommend meeting with a therapist. Let me know if you change your mind and will provide some numbers. Ok to continue elavil at night for sleep.   Try the flucitcasone nasal spray OR atrovent nasal spray for allergies and nasal congestion. Start with flonase. Return to the clinic or go to the nearest emergency room if any of your symptoms worsen or new symptoms occur.    If you have lab work done today you will be contacted with your lab results within the next 2 weeks.  If you have not heard from [place] then please contact us. The fastest way to get your results is to register for My Chart.   IF you received an x-ray today, you will receive an invoice from [facility]. Please contact [facility] at [phone] with questions or concerns regarding your invoice.   IF you received labwork today, you will receive an invoice from [facility]. Please contact [facility] at [phone] with questions or concerns regarding your invoice.   Our billing staff will not be able to assist you with questions regarding bills from these companies.  You will be contacted with the lab results as soon as they are available. The fastest way to get your results is to activate your My Chart account. Instructions are located on the last page of this paperwork. If you have not heard from [place] regarding the results in 2 weeks, please contact this office.

## 2018-10-22 ENCOUNTER — Other Ambulatory Visit: Payer: Self-pay | Admitting: Family Medicine

## 2018-10-22 DIAGNOSIS — F411 Generalized anxiety disorder: Secondary | ICD-10-CM

## 2018-10-26 ENCOUNTER — Encounter: Payer: Self-pay | Admitting: Orthopaedic Surgery

## 2018-10-26 ENCOUNTER — Other Ambulatory Visit: Payer: Self-pay

## 2018-10-26 ENCOUNTER — Ambulatory Visit (INDEPENDENT_AMBULATORY_CARE_PROVIDER_SITE_OTHER): Payer: Medicare HMO | Admitting: Orthopaedic Surgery

## 2018-10-26 VITALS — Ht 69.0 in | Wt 142.0 lb

## 2018-10-26 DIAGNOSIS — M65832 Other synovitis and tenosynovitis, left forearm: Secondary | ICD-10-CM

## 2018-10-26 NOTE — Progress Notes (Signed)
Patient ID: Roy Moore, male   DOB: January 20, 1956, 63 y.o.   MRN: [id number]  Raynell is two-week status post left extensor tendon tenolysis.  He is doing well overall.  He has some swelling around the incision.  Has mild swelling.  He has not used any ice.  Physical exam shows a healed surgical incision.  We remove the sutures today.  He does have some mild swelling.  He has some mild limitation with wrist flexion and finger flexion due to swelling.  I have recommended that he should use ice to the area regularly and I have also taught him exercises to help improve his range of motion of his wrist and fingers.  He will see Dr. Estanislado Pandy later this month for his rheumatology appointment.  He can follow-up with [place] as needed.

## 2018-11-04 NOTE — Progress Notes (Deleted)
Office Visit Note  Patient: Roy Moore             Date of Birth: [birth year]           MRN: [id number]             PCP: [name], MD Referring: Leandrew Koyanagi, MD Visit Date: 11/17/2018 Occupation: @GUAROCC @  Subjective:  No chief complaint on file.   History of Present Illness: Roy Moore is a 63 y.o. male ***   Activities of Daily Living:  Patient reports morning stiffness for *** {minute/hour:19697}.   Patient {ACTIONS;DENIES/REPORTS:21021675::"Denies"} nocturnal pain.  Difficulty dressing/grooming: {ACTIONS;DENIES/REPORTS:21021675::"Denies"} Difficulty climbing stairs: {ACTIONS;DENIES/REPORTS:21021675::"Denies"} Difficulty getting out of chair: {ACTIONS;DENIES/REPORTS:21021675::"Denies"} Difficulty using hands for taps, buttons, cutlery, and/or writing: {ACTIONS;DENIES/REPORTS:21021675::"Denies"}  No Rheumatology ROS completed.   PMFS History:  Patient Active Problem List   Diagnosis Date Noted  . Extensor tenosynovitis of left wrist 09/20/2018  . PAD (peripheral artery disease) ([place]) 07/24/2016  . Peripheral arterial disease ([place]) 07/17/2016  . Lumbar stenosis with neurogenic claudication 12/03/2015  . CVA, old, hemiparesis ([place]) 03/24/2011  . ANXIETY DISORDER 10/04/2007  . TOBACCO ABUSE 09/30/2007  . GERD 09/30/2007  . DYSLIPIDEMIA 09/15/2007  . STROKE WITH LEFT HEMIPARESIS 09/15/2007    Past Medical History:  Diagnosis Date  . Anxiety   . Hyperlipidemia   . Peripheral vascular disease ([place])   . Stroke [facility]) 2009   no residual    Family History  Problem Relation Age of Onset  . Colon cancer Neg Hx   . Rectal cancer Neg Hx   . Stomach cancer Neg Hx   . Esophageal cancer Neg Hx    Past Surgical History:  Procedure Laterality Date  . ABDOMINAL AORTOGRAM W/LOWER EXTREMITY [name] 06/09/2016   Procedure: Abdominal Aortogram w/Lower Extremity;  Surgeon: Serafina Mitchell, MD;  Location: [place] [facility];  Service: Cardiovascular;   Laterality: [name];  . BACK SURGERY     10 yrs ago or so-fusion in neck   . CERVICAL FUSION    . COLONOSCOPY     15-20 yrs ago-normal per pt.   . COLONOSCOPY W/ POLYPECTOMY    . CYSTOSCOPY     kidney stools removed   . FEMORAL-POPLITEAL BYPASS GRAFT Right 07/24/2016   Procedure: RIGHT FEMORAL TO ABOVE KNEE POPLITEAL ARTERY BYPASS GRAFT;  Surgeon: Serafina Mitchell, MD;  Location: [place];  Service: Vascular;  Laterality: Right;  . LUMBAR LAMINECTOMY/DECOMPRESSION MICRODISCECTOMY Bilateral 12/03/2015   Procedure: Microdiscectomy - bilateral - Lumbar four-lumbar five;  Surgeon: Earnie Larsson, MD;  Location: [place];  Service: Neurosurgery;  Laterality: Bilateral;  . PERIPHERAL VASCULAR INTERVENTION Right 06/09/2016   Procedure: Peripheral Vascular Intervention;  Surgeon: Serafina Mitchell, MD;  Location: [place] [facility];  Service: Cardiovascular;  Laterality: Right;  SFA  . REPAIR EXTENSOR TENDON Left 10/12/2018   Procedure: TENDON SHEATH RELEASE/TENOLYSIS;  Surgeon: Leandrew Koyanagi, MD;  Location: [place];  Service: Orthopedics;  Laterality: Left;  . teeth removal     Social History   Social History Narrative   Wife is bedridden and he is primary caregiver---no family.   Immunization History  Administered Date(s) Administered  . Tdap 01/21/2014     Objective: Vital Signs: There were no vitals taken for this visit.   Physical Exam   Musculoskeletal Exam: ***  CDAI Exam: CDAI Score: - Patient Global: -; Provider Global: - Swollen: -; Tender: - Joint Exam   No joint exam has been documented  for this visit   There is currently no information documented on the homunculus. Go to the Rheumatology activity and complete the homunculus joint exam.  Investigation: Findings:  09/20/18: ANA-, uric acid 5.1, sed rate 41, RF 371  Component     Latest Ref Rng & Units 09/20/2018  Anti Nuclear Antibody (ANA)     NEGATIVE NEGATIVE  Uric Acid, Serum     4.0 - 8.0 mg/dL 5.1  Sed  Rate     0 - 20 mm/h 41 (H)  RA Latex Turbid.     <14 IU/mL 371 (H)   Imaging: No results found.  Recent Labs: Lab Results  Component Value Date   WBC 12.8 (H) 12/08/2016   HGB 12.6 (L) 12/08/2016   PLT 345 12/08/2016   NA 139 07/13/2018   K 4.5 07/13/2018   CL 102 07/13/2018   CO2 20 07/13/2018   GLUCOSE 89 07/13/2018   BUN 16 07/13/2018   CREATININE 1.37 (H) 07/13/2018   BILITOT 0.3 07/13/2018   ALKPHOS 82 07/13/2018   AST 16 07/13/2018   ALT 10 07/13/2018   PROT 6.7 07/13/2018   ALBUMIN 4.0 07/13/2018   CALCIUM 8.9 07/13/2018   GFRAA 63 07/13/2018    Speciality Comments: No specialty comments available.  Procedures:  No procedures performed Allergies: No known allergies   Assessment / Plan:     Visit Diagnoses: No diagnosis found.  Orders: No orders of the defined types were placed in this encounter.  No orders of the defined types were placed in this encounter.   Face-to-face time spent with patient was *** minutes. Greater than 50% of time was spent in counseling and coordination of care.  Follow-Up Instructions: No follow-ups on file.   Ofilia Neas, PA-C  Note - This record has been created using Dragon software.  Chart creation errors have been sought, but may not always  have been located. Such creation errors do not reflect on  the standard of medical care.

## 2018-11-10 ENCOUNTER — Other Ambulatory Visit: Payer: Self-pay | Admitting: Family Medicine

## 2018-11-10 DIAGNOSIS — J3489 Other specified disorders of nose and nasal sinuses: Secondary | ICD-10-CM

## 2018-11-10 DIAGNOSIS — G4700 Insomnia, unspecified: Secondary | ICD-10-CM

## 2018-11-10 DIAGNOSIS — J329 Chronic sinusitis, unspecified: Secondary | ICD-10-CM

## 2018-11-10 DIAGNOSIS — F411 Generalized anxiety disorder: Secondary | ICD-10-CM

## 2018-11-10 NOTE — Telephone Encounter (Signed)
Requested medication (s) are due for refill today: yes  Requested medication (s) are on the active medication list: yes  Last refill:  10/13/2018  Future visit scheduled: yes  Notes to clinic:  Refill cannot be delegated    Requested Prescriptions  Pending Prescriptions Disp Refills   ALPRAZolam (XANAX) 1 MG tablet [Pharmacy Med Name: ALPRAZolam 1 MG Oral Tablet] 60 tablet 0    Sig: TAKE 1/2 TO 1 (ONE-HALF TO ONE) TABLET BY MOUTH TWICE DAILY AS NEEDED FOR ANXIETY     Not Delegated - Psychiatry:  Anxiolytics/Hypnotics Failed - 11/10/2018 11:10 AM      Failed - This refill cannot be delegated      Failed - Urine Drug Screen completed in last 360 days.      Passed - Valid encounter within last 6 months    Recent Outpatient Visits          3 weeks ago Generalized anxiety disorder   [facility], Ranell Patrick, MD   4 months ago Generalized anxiety disorder   [facility], Ranell Patrick, MD   5 months ago Ganglion cyst of dorsum of left wrist   [facility], Ines Bloomer, MD   10 months ago Essential hypertension   [facility], Ranell Patrick, MD   1 year ago Elevated alkaline phosphatase level   [facility], Ranell Patrick, MD      Future Appointments            In 1 week Carlota Raspberry Ranell Patrick, MD [facility] [place], [facility]

## 2018-11-11 NOTE — Telephone Encounter (Signed)
Pt requestion refills for Xanax, lovastatin, amitriptyline, ipratropium and bp medication. Pt requesting refill today before office closes for the weekend. Pt has appt on 10/26 Pt called in 10/15 for refills

## 2018-11-11 NOTE — Telephone Encounter (Signed)
Patient calling to check the status. States that he was told that these would be sent in to pharmacy today. Please advise

## 2018-11-14 MED ORDER — ALPRAZOLAM 1 MG PO TABS: ORAL_TABLET | ORAL | 0 refills | Status: DC

## 2018-11-14 MED ORDER — IPRATROPIUM BROMIDE 0.06 % NA SOLN: 1.0000 | Freq: Four times a day (QID) | NASAL | 5 refills | Status: DC

## 2018-11-14 MED ORDER — AMITRIPTYLINE HCL 50 MG PO TABS: 50.0000 mg | ORAL_TABLET | Freq: Every day | ORAL | 0 refills | Status: DC

## 2018-11-14 NOTE — Telephone Encounter (Signed)
Patient calling to check on why his medication refill was denied. Please advise. Would like a call back today.

## 2018-11-14 NOTE — Addendum Note (Signed)
Addended by: [name] R on: 11/14/2018 03:19 PM   Modules accepted: Orders

## 2018-11-14 NOTE — Telephone Encounter (Signed)
Pt calling - states that the pharmacy doesn't have any refills for him and he wants to check on this.

## 2018-11-14 NOTE — Telephone Encounter (Addendum)
Has upcoming appt on 10/26.  Controlled substance database (PDMP) reviewed. Rx hydrocodone 5/325 # 20 on 9/24, #20 of 7/325 on 9/16.   Last rx alprazolam filled 10/13/18 Xanax Refill authorized, but must keep upcoming appt. Also please advise patient that he cannot combine alprazolam and hydrocodone as increased risk of accidental overdose.  I also refilled elavil and ipratropium.  Should have another refill left of lovastatin waiting at pharmacy.  What BP med?

## 2018-11-15 NOTE — Telephone Encounter (Signed)
Patient was advised prescription were called in.  Advised he needed to keep his appointment on 11-21-2018 for any additional refills. Patient voiced understanding

## 2018-11-17 ENCOUNTER — Ambulatory Visit: Payer: Medicare HMO | Admitting: Rheumatology

## 2018-11-21 ENCOUNTER — Ambulatory Visit: Payer: Medicare HMO | Admitting: Family Medicine

## 2018-11-30 ENCOUNTER — Ambulatory Visit: Payer: Medicare HMO | Admitting: Family Medicine

## 2018-12-02 ENCOUNTER — Ambulatory Visit: Payer: Medicare HMO | Admitting: Family Medicine

## 2018-12-05 ENCOUNTER — Other Ambulatory Visit: Payer: Self-pay

## 2018-12-05 ENCOUNTER — Telehealth (INDEPENDENT_AMBULATORY_CARE_PROVIDER_SITE_OTHER): Payer: Medicare HMO | Admitting: Family Medicine

## 2018-12-05 DIAGNOSIS — G4700 Insomnia, unspecified: Secondary | ICD-10-CM | POA: Diagnosis not present

## 2018-12-05 DIAGNOSIS — F411 Generalized anxiety disorder: Secondary | ICD-10-CM

## 2018-12-05 DIAGNOSIS — I10 Essential (primary) hypertension: Secondary | ICD-10-CM | POA: Diagnosis not present

## 2018-12-05 DIAGNOSIS — R69 Illness, unspecified: Secondary | ICD-10-CM | POA: Diagnosis not present

## 2018-12-05 MED ORDER — ALPRAZOLAM 1 MG PO TABS: ORAL_TABLET | ORAL | 0 refills | Status: DC

## 2018-12-05 NOTE — Patient Instructions (Addendum)
Check blood pressures once per week and let me know in next few weeks those numbers. We have the option of increasing to 10mg  of amlodipine if still elevated.   I did send another month refill for the alprazolam, but will check into the status of the referral to psychiatry. As we discussed you will need to meet with them to discuss other medication options or continuing on just alprazolam.   Return to the clinic or go to the nearest emergency room if any of your symptoms worsen or new symptoms occur.    Managing Your Hypertension Hypertension is commonly called high blood pressure. This is when the force of your blood pressing against the walls of your arteries is too strong. Arteries are blood vessels that carry blood from your heart throughout your body. Hypertension forces the heart to work harder to pump blood, and may cause the arteries to become narrow or stiff. Having untreated or uncontrolled hypertension can cause heart attack, stroke, kidney disease, and other problems. What are blood pressure readings? A blood pressure reading consists of a higher number over a lower number. Ideally, your blood pressure should be below 120/80. The first ("top") number is called the systolic pressure. It is a measure of the pressure in your arteries as your heart beats. The second ("bottom") number is called the diastolic pressure. It is a measure of the pressure in your arteries as the heart relaxes. What does my blood pressure reading mean? Blood pressure is classified into four stages. Based on your blood pressure reading, your health care provider may use the following stages to determine what type of treatment you need, if any. Systolic pressure and diastolic pressure are measured in a unit called mm Hg. Normal  Systolic pressure: below 123456.  Diastolic pressure: below 80. Elevated  Systolic pressure: [id number].  Diastolic pressure: below 80. Hypertension stage 1  Systolic pressure: 0000000.   Diastolic pressure: [id number]. Hypertension stage 2  Systolic pressure: [id number] or above.  Diastolic pressure: 90 or above. What health risks are associated with hypertension? Managing your hypertension is an important responsibility. Uncontrolled hypertension can lead to:  A heart attack.  A stroke.  A weakened blood vessel (aneurysm).  Heart failure.  Kidney damage.  Eye damage.  Metabolic syndrome.  Memory and concentration problems. What changes can I make to manage my hypertension? Hypertension can be managed by making lifestyle changes and possibly by taking medicines. Your health care provider will help you make a plan to bring your blood pressure within a normal range. Eating and drinking   Eat a diet that is high in fiber and potassium, and low in salt (sodium), added sugar, and fat. An example eating plan is called the DASH (Dietary Approaches to Stop Hypertension) diet. To eat this way: ? Eat plenty of fresh fruits and vegetables. Try to fill half of your plate at each meal with fruits and vegetables. ? Eat whole grains, such as whole wheat pasta, brown rice, or whole grain bread. Fill about one quarter of your plate with whole grains. ? Eat low-fat diary products. ? Avoid fatty cuts of meat, processed or cured meats, and poultry with skin. Fill about one quarter of your plate with lean proteins such as fish, chicken without skin, beans, eggs, and tofu. ? Avoid premade and processed foods. These tend to be higher in sodium, added sugar, and fat.  Reduce your daily sodium intake. Most people with hypertension should eat less than 1,500 mg of sodium a day.  Limit alcohol intake to no more than 1 drink a day for nonpregnant women and 2 drinks a day for men. One drink equals 12 oz of beer, 5 oz of wine, or 1 oz of hard liquor. Lifestyle  Work with your health care provider to maintain a healthy body weight, or to lose weight. Ask what an ideal weight is for you.  Get at  least 30 minutes of exercise that causes your heart to beat faster (aerobic exercise) most days of the week. Activities may include walking, swimming, or biking.  Include exercise to strengthen your muscles (resistance exercise), such as weight lifting, as part of your weekly exercise routine. Try to do these types of exercises for 30 minutes at least 3 days a week.  Do not use any products that contain nicotine or tobacco, such as cigarettes and e-cigarettes. If you need help quitting, ask your health care provider.  Control any long-term (chronic) conditions you have, such as high cholesterol or diabetes. Monitoring  Monitor your blood pressure at home as told by your health care provider. Your personal target blood pressure may vary depending on your medical conditions, your age, and other factors.  Have your blood pressure checked regularly, as often as told by your health care provider. Working with your health care provider  Review all the medicines you take with your health care provider because there may be side effects or interactions.  Talk with your health care provider about your diet, exercise habits, and other lifestyle factors that may be contributing to hypertension.  Visit your health care provider regularly. Your health care provider can help you create and adjust your plan for managing hypertension. Will I need medicine to control my blood pressure? Your health care provider may prescribe medicine if lifestyle changes are not enough to get your blood pressure under control, and if:  Your systolic blood pressure is 130 or higher.  Your diastolic blood pressure is 80 or higher. Take medicines only as told by your health care provider. Follow the directions carefully. Blood pressure medicines must be taken as prescribed. The medicine does not work as well when you skip doses. Skipping doses also puts you at risk for problems. Contact a health care provider if:  You think  you are having a reaction to medicines you have taken.  You have repeated (recurrent) headaches.  You feel dizzy.  You have swelling in your ankles.  You have trouble with your vision. Get help right away if:  You develop a severe headache or confusion.  You have unusual weakness or numbness, or you feel faint.  You have severe pain in your chest or abdomen.  You vomit repeatedly.  You have trouble breathing. Summary  Hypertension is when the force of blood pumping through your arteries is too strong. If this condition is not controlled, it may put you at risk for serious complications.  Your personal target blood pressure may vary depending on your medical conditions, your age, and other factors. For most people, a normal blood pressure is less than 120/80.  Hypertension is managed by lifestyle changes, medicines, or both. Lifestyle changes include weight loss, eating a healthy, low-sodium diet, exercising more, and limiting alcohol. This information is not intended to replace advice given to you by your health care provider. Make sure you discuss any questions you have with your health care provider. Document Released: 10/07/2011 Document Revised: 05/06/2018 Document Reviewed: 12/11/2015 Elsevier Patient Education  2020 [employer].

## 2018-12-05 NOTE — Progress Notes (Signed)
Virtual Visit via Telephone Note  I connected with Roy Moore on 12/05/18 at 4:18 PM by telephone and verified that I am speaking with the correct person using two identifiers.   I discussed the limitations, risks, security and privacy concerns of performing an evaluation and management service by telephone and the availability of in person appointments. I also discussed with the patient that there may be a patient responsible charge related to this service. The patient expressed understanding and agreed to proceed, consent obtained  Chief complaint: HTN, anxiety  History of Present Illness: Roy Moore is a 63 y.o. male  Generalized anxiety disorder: Has had persistent need for Xanax 1 mg twice daily.  Tried SSRI with low-dose Zoloft at 25 mg to try to decrease the need.  Cautioned on serotonin syndrome with concurrent use of amitriptyline.  No significant change with Zoloft with reported use for 3 months.  Has declined to meet with counselor or therapist, and declined trying SSRI again.  Decided to remain on alprazolam but referred to psychiatry to evaluate for other treatment options.  Referral was placed in September -[facility]. Did call him once, then plan to call our office? Has not hear since.  Elavil 50mg  for sleep continued.   Taking alprazolam BID. Working fine for anxiety.   Controlled substance database (PDMP) reviewed.  Last prescription for alprazolam filled on October 19 for #60, 30-day supply.  Previous prescription September 17 for #60, 30-day supply.  Noted prescriptions for hydrocodone 7.5 mg #20 on September 16, hydrocodone 5 mg #20 on September 24, no recent listings.  He did undergo tendon sheath release for extensor tenosynovitis of left wrist by Dr. Erlinda Hong on September 16.   Denies any recent narcotic use.   Depression screen [facility] 2/9 12/05/2018 10/20/2018 10/20/2018 07/13/2018 06/13/2018  Decreased Interest 0 0 0 0 0  Down,  Depressed, Hopeless 0 0 0 0 0  PHQ - 2 Score 0 0 0 0 0  Altered sleeping - - - - -  Tired, decreased energy - - - - -  Change in appetite - - - - -  Feeling bad or failure about yourself  - - - - -  Trouble concentrating - - - - -  Moving slowly or fidgety/restless - - - - -  Suicidal thoughts - - - - -  PHQ-9 Score - - - - -  Difficult doing work/chores - - - - -  Some recent data might be hidden    Hypertension: Decreased control at office visit September 24.  Potential pain component, but still increase in amlodipine to 7.5 mg.  Taking 7.5mg  QD.  No home readings does not have machine - plans to check at [facility] tomorrow.   Constitutional: Negative for fatigue and unexpected weight change.  Eyes: Negative for visual disturbance.  Respiratory: Negative for cough, chest tightness and shortness of breath.   Cardiovascular: Negative for chest pain, palpitations and leg swelling.  Gastrointestinal: Negative for abdominal pain and blood in stool.  Neurological: Negative for dizziness, light-headedness and headaches.     BP Readings from Last 3 Encounters:  10/20/18 (!) 180/76  10/12/18 (!) 167/74  07/13/18 120/68   Lab Results  Component Value Date   CREATININE 1.37 (H) 07/13/2018        Patient Active Problem List   Diagnosis Date Noted  . Extensor tenosynovitis of left wrist 09/20/2018  . PAD (peripheral artery disease) ([place]) 07/24/2016  . Peripheral arterial  disease ([place]) 07/17/2016  . Lumbar stenosis with neurogenic claudication 12/03/2015  . CVA, old, hemiparesis ([place]) 03/24/2011  . ANXIETY DISORDER 10/04/2007  . TOBACCO ABUSE 09/30/2007  . GERD 09/30/2007  . DYSLIPIDEMIA 09/15/2007  . 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 Anxiety   . Hyperlipidemia   . Peripheral vascular disease ([place])   . Stroke [facility]) 2009   no residual   Past Surgical History:  Procedure Laterality Date  .  ABDOMINAL AORTOGRAM W/LOWER EXTREMITY [name] 06/09/2016   Procedure: Abdominal Aortogram w/Lower Extremity;  Surgeon: Serafina Mitchell, MD;  Location: [place] [facility];  Service: Cardiovascular;  Laterality: [name];  . BACK SURGERY     10 yrs ago or so-fusion in neck   . CERVICAL FUSION    . COLONOSCOPY     15-20 yrs ago-normal per pt.   . COLONOSCOPY W/ POLYPECTOMY    . CYSTOSCOPY     kidney stools removed   . FEMORAL-POPLITEAL BYPASS GRAFT Right 07/24/2016   Procedure: RIGHT FEMORAL TO ABOVE KNEE POPLITEAL ARTERY BYPASS GRAFT;  Surgeon: Serafina Mitchell, MD;  Location: [place];  Service: Vascular;  Laterality: Right;  . LUMBAR LAMINECTOMY/DECOMPRESSION MICRODISCECTOMY Bilateral 12/03/2015   Procedure: Microdiscectomy - bilateral - Lumbar four-lumbar five;  Surgeon: Earnie Larsson, MD;  Location: [place];  Service: Neurosurgery;  Laterality: Bilateral;  . PERIPHERAL VASCULAR INTERVENTION Right 06/09/2016   Procedure: Peripheral Vascular Intervention;  Surgeon: Serafina Mitchell, MD;  Location: [place] [facility];  Service: Cardiovascular;  Laterality: Right;  SFA  . REPAIR EXTENSOR TENDON Left 10/12/2018   Procedure: TENDON SHEATH RELEASE/TENOLYSIS;  Surgeon: Leandrew Koyanagi, MD;  Location: [place];  Service: Orthopedics;  Laterality: Left;  . teeth removal     Allergies  Allergen Reactions  . No Known Allergies    Prior to Admission medications   Medication Sig Start Date End Date Taking? Authorizing Provider  ALPRAZolam (XANAX) 1 MG tablet TAKE 1/2 TO 1 (ONE-HALF TO ONE) TABLET BY MOUTH TWICE DAILY AS NEEDED FOR ANXIETY 11/14/18  Yes [name], MD  amitriptyline (ELAVIL) 50 MG tablet Take 1 tablet (50 mg total) by mouth at bedtime. 11/14/18  Yes [name], MD  fluticasone (FLONASE) 50 MCG/ACT nasal spray Place 2 sprays into both nostrils daily. 10/20/18  Yes [name], MD  ipratropium (ATROVENT) 0.06 % nasal spray Place 1-2 sprays into both nostrils 4 (four) times  daily. As needed for nasal congestion 11/14/18  Yes [name], MD  lovastatin (MEVACOR) 20 MG tablet Take 1 tablet (20 mg total) by mouth at bedtime. 07/13/18  Yes [name], MD  clopidogrel (PLAVIX) 75 MG tablet Take 1 tablet (75 mg total) by mouth daily. 07/13/18   [name], MD  amLODipine (NORVASC) 5 MG tablet Take 1 tablet (5 mg total) by mouth daily. 07/13/18 10/20/18  [name], MD   Social History   Socioeconomic History  . Marital status: Married    Spouse name: Not on file  . Number of children: Not on file  . Years of education: Not on file  . Highest education level: Not on file  Occupational History  . Not on file  Social Needs  . Financial resource strain: Not on file  . Food insecurity    Worry: Not on file    Inability: Not on file  . Transportation needs    Medical:  Not on file    Non-medical: Not on file  Tobacco Use  . Smoking status: Current Every Day Smoker    Packs/day: 0.50    Years: 47.00    Pack years: 23.50    Types: Cigarettes  . Smokeless tobacco: Never Used  . Tobacco comment: advised to hold for 24 hours prior  Substance and Sexual Activity  . Alcohol use: No    Alcohol/week: 0.0 standard drinks    Comment: no alcohol in 23 years (12/02/15  . Drug use: No  . Sexual activity: Not on file  Lifestyle  . Physical activity    Days per week: Not on file    Minutes per session: Not on file  . Stress: Not on file  Relationships  . Social Herbalist on phone: Not on file    Gets together: Not on file    Attends religious service: Not on file    Active member of club or organization: Not on file    Attends meetings of clubs or organizations: Not on file    Relationship status: Not on file  . Intimate partner violence    Fear of current or ex partner: Not on file    Emotionally abused: Not on file    Physically abused: Not on file    Forced sexual activity: Not on file  Other Topics Concern  . Not on file   Social History Narrative   Wife is bedridden and he is primary caregiver---no family.     Observations/Objective: There were no vitals filed for this visit. Calm demeanor on phone, appropriate responses.  All questions answered.  Understanding of plan expressed  Assessment and Plan: Essential hypertension  -Tolerating current dose of amlodipine.  Plan for home readings with update by MyChart or phone call next few weeks.  Insomnia, unspecified type - Plan: ALPRAZolam (XANAX) 1 MG tablet Generalized anxiety disorder - Plan: ALPRAZolam (XANAX) 1 MG tablet  -Stable symptoms, plan for psychiatry follow-up per last visit to look into other options as declined repeat SSRI.  Continue alprazolam for now, will check into psychiatry referral  Follow Up Instructions:   3 months.    I discussed the assessment and treatment plan with the patient. The patient was provided an opportunity to ask questions and all were answered. The patient agreed with the plan and demonstrated an understanding of the instructions.   The patient was advised to call back or seek an in-person evaluation if the symptoms worsen or if the condition fails to improve as anticipated.  I provided 10 minutes of non-face-to-face time during this encounter.  Signed,   [name], MD [facility] [place].  12/05/18

## 2018-12-05 NOTE — Progress Notes (Signed)
CC- 1 month f/u on bp meds. Patient need a refill on his bp med and also will need a refill on xanax as well. Xanax is coming due for refill soon

## 2019-01-12 ENCOUNTER — Other Ambulatory Visit: Payer: Self-pay | Admitting: Family Medicine

## 2019-01-12 DIAGNOSIS — G4700 Insomnia, unspecified: Secondary | ICD-10-CM

## 2019-01-12 DIAGNOSIS — F411 Generalized anxiety disorder: Secondary | ICD-10-CM

## 2019-01-12 NOTE — Telephone Encounter (Signed)
Requested medication (s) are due for refill today: yes  Requested medication (s) are on the active medication list: yes  Last refill:  12/14/2018  Future visit scheduled: yes  Notes to clinic:  refill cannot be delegated    Requested Prescriptions  Pending Prescriptions Disp Refills   ALPRAZolam (XANAX) 1 MG tablet [Pharmacy Med Name: ALPRAZolam 1 MG Oral Tablet] 60 tablet 0    Sig: TAKE 1/2 TO 1 (ONE-HALF TO ONE) TABLET BY MOUTH TWICE DAILY AS NEEDED FOR ANXIETY      Not Delegated - Psychiatry:  Anxiolytics/Hypnotics Failed - 01/12/2019  2:04 PM      Failed - This refill cannot be delegated      Failed - Urine Drug Screen completed in last 360 days.      Passed - Valid encounter within last 6 months    Recent Outpatient Visits           1 month ago Essential hypertension   [facility], Ranell Patrick, MD   2 months ago Generalized anxiety disorder   [facility], Ranell Patrick, MD   6 months ago Generalized anxiety disorder   [facility], Ranell Patrick, MD   7 months ago Ganglion cyst of dorsum of left wrist   [facility], Ines Bloomer, MD   1 year ago Essential hypertension   [facility], Ranell Patrick, MD       Future Appointments             In 1 month Carlota Raspberry Ranell Patrick, MD [facility] [place], [facility]

## 2019-01-13 NOTE — Telephone Encounter (Signed)
Patient is requesting a refill of the following medications: Requested Prescriptions   Pending Prescriptions Disp Refills   ALPRAZolam (XANAX) 1 MG tablet [Pharmacy Med Name: ALPRAZolam 1 MG Oral Tablet] 60 tablet 0    Sig: TAKE 1/2 TO 1 (ONE-HALF TO ONE) TABLET BY MOUTH TWICE DAILY AS NEEDED FOR ANXIETY    Date of patient request: 01/12/19 Last office visit: 12/05/18 Date of last refill: 12/05/18 Last refill amount: 60- 0RF Follow up time period per chart: 03/06/19

## 2019-01-13 NOTE — Telephone Encounter (Signed)
Controlled substance database (PDMP) reviewed. No concerns appreciated. Last filled 11/18.   Please check into status of psychiatry referral form September and let me know if appointment scheduled (and when). Thanks.

## 2019-01-18 ENCOUNTER — Ambulatory Visit: Payer: Medicare HMO | Admitting: Rheumatology

## 2019-01-30 IMAGING — NM NM MISC PROCEDURE
10 series · 60 of 60 positions shown · non-contrast
Comparison: none

[Series 1: wbr_r-proj_st rest · 6.51mm/px · 6 of 64 frames shown]
[frame 6/64]
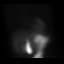
[frame 16/64]
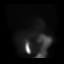
[frame 27/64]
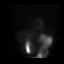
[frame 38/64]
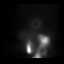
[frame 48/64]
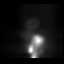
[frame 59/64]
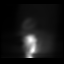

[Series 1: rest · 6.51mm/px · 6 of 64 frames shown]
[frame 6/64]
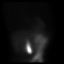
[frame 16/64]
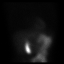
[frame 27/64]
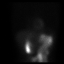
[frame 38/64]
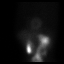
[frame 48/64]
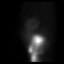
[frame 59/64]
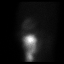

[Series 2: stress · 6.51mm/px · 6 of 64 frames shown (1 of 4)]
[frame 6/64]
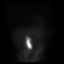
[frame 16/64]
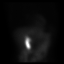
[frame 27/64]
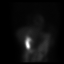
[frame 38/64]
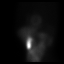
[frame 48/64]
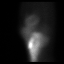
[frame 59/64]
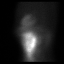

[Series 2: stress · 6.51mm/px · 6 of 512 frames shown (2 of 4)]
[frame 43/512]
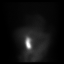
[frame 128/512]
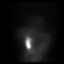
[frame 214/512]
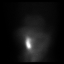
[frame 299/512]
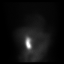
[frame 384/512]
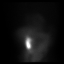
[frame 470/512]
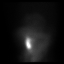

[Series 2: wbr_s-proj_st stress · 6.51mm/px · 6 of 512 frames shown (1 of 4)]
[frame 43/512]
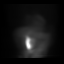
[frame 128/512]
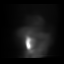
[frame 214/512]
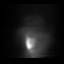
[frame 299/512]
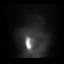
[frame 384/512]
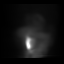
[frame 470/512]
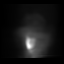

[Series 2: wbr_s-proj_st stress · 6.51mm/px · 6 of 64 frames shown (2 of 4)]
[frame 6/64]
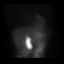
[frame 16/64]
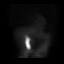
[frame 27/64]
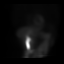
[frame 38/64]
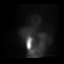
[frame 48/64]
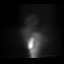
[frame 59/64]
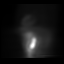

[Series 3: stress · 6.51mm/px · 6 of 512 frames shown (3 of 4)]
[frame 43/512]
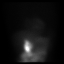
[frame 128/512]
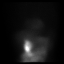
[frame 214/512]
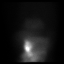
[frame 299/512]
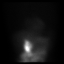
[frame 384/512]
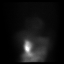
[frame 470/512]
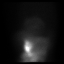

[Series 3: wbr_s-proj_st stress · 6.51mm/px · 6 of 512 frames shown (3 of 4)]
[frame 43/512]
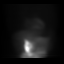
[frame 128/512]
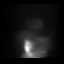
[frame 214/512]
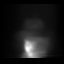
[frame 299/512]
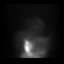
[frame 384/512]
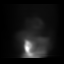
[frame 470/512]
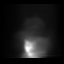

[Series 3: wbr_s-proj_st stress · 6.51mm/px · 6 of 64 frames shown (4 of 4)]
[frame 6/64]
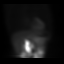
[frame 16/64]
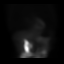
[frame 27/64]
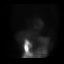
[frame 38/64]
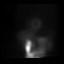
[frame 48/64]
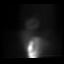
[frame 59/64]
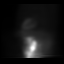

[Series 3: stress · 6.51mm/px · 6 of 64 frames shown (4 of 4)]
[frame 6/64]
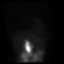
[frame 16/64]
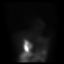
[frame 27/64]
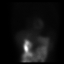
[frame 38/64]
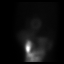
[frame 48/64]
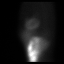
[frame 59/64]
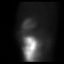

[60 of 60 positions shown; findings below may reference images not displayed]

Canned report from images found in remote index.

Refer to host system for actual result text.

## 2019-01-31 ENCOUNTER — Telehealth: Payer: Self-pay | Admitting: *Deleted

## 2019-01-31 NOTE — Telephone Encounter (Signed)
AWV schedule

## 2019-02-08 ENCOUNTER — Telehealth: Payer: Self-pay | Admitting: *Deleted

## 2019-02-08 DIAGNOSIS — G4700 Insomnia, unspecified: Secondary | ICD-10-CM

## 2019-02-08 DIAGNOSIS — F411 Generalized anxiety disorder: Secondary | ICD-10-CM

## 2019-02-08 NOTE — Telephone Encounter (Signed)
Dr. Carlota Raspberry,   Mr. Brazee wanted to put a message in for his Xanax refill last filled 01-13-2020.  He has an appointment scheduled 03-05-2018.

## 2019-02-09 ENCOUNTER — Ambulatory Visit (INDEPENDENT_AMBULATORY_CARE_PROVIDER_SITE_OTHER): Payer: Medicare HMO | Admitting: Family Medicine

## 2019-02-09 VITALS — BP 130/80 | Ht 69.0 in | Wt 142.0 lb

## 2019-02-09 DIAGNOSIS — Z0000 Encounter for general adult medical examination without abnormal findings: Secondary | ICD-10-CM | POA: Diagnosis not present

## 2019-02-09 NOTE — Progress Notes (Signed)
Presents today for [employer] Visit   Date of last exam: 12/05/2018  Interpreter used for this visit? No  I connected with  Roy Moore on 02/09/19 by a telephone application and verified that I am speaking with the correct person using two identifiers.   I discussed the limitations of evaluation and management by telemedicine. The patient expressed understanding and agreed to proceed.    Patient Care Team: [name], MD as PCP - General (Family Medicine) Earnie Larsson, MD as Consulting Physician (Neurosurgery) Serafina Mitchell, MD as Consulting Physician (Vascular Surgery)   Other items to address today:   Discussed immunizations Discussed Eye/Dental  Follow up scheduled Dr. Carlota Raspberry 03/15/2018 @ 4;20  Other Screening:  Last lipid screening: 07/13/2018  ADVANCE DIRECTIVES: Discussed: yes On File:   no Materials Provided: yes (mailed)  Immunization status:  Immunization History  Administered Date(s) Administered  . Tdap 01/21/2014     There are no preventive care reminders to display for this patient.   Functional Status Survey: Is the patient deaf or have difficulty hearing?: No Does the patient have difficulty seeing, even when wearing glasses/contacts?: No Does the patient have difficulty concentrating, remembering, or making decisions?: No Does the patient have difficulty walking or climbing stairs?: No Does the patient have difficulty dressing or bathing?: No Does the patient have difficulty doing errands alone such as visiting a doctor's office or shopping?: No   6CIT Screen 02/09/2019 09/18/2016  What Year? 0 points 0 points  What month? 0 points 0 points  What time? 0 points 0 points  Count back from 20 0 points 0 points  Months in reverse 4 points 4 points  Repeat phrase 0 points 6 points  Total Score 4 10        Clinical Support from 02/09/2019 in [place] at [place]  AUDIT-C Score  0       Home Environment:    Patients wife just had a stroke he is a little depressed but states he is able to push through.  She is in the hospital and will be going to rehab.  He is her main caregiver. Yes has difficulty climbing stairs (athrtitis)  Lives in one story home No scattered rugs Yes grab bars Adequate lighting/no clutter    Patient Active Problem List   Diagnosis Date Noted  . Extensor tenosynovitis of left wrist 09/20/2018  . PAD (peripheral artery disease) ([place]) 07/24/2016  . Peripheral arterial disease ([place]) 07/17/2016  . Lumbar stenosis with neurogenic claudication 12/03/2015  . CVA, old, hemiparesis ([place]) 03/24/2011  . ANXIETY DISORDER 10/04/2007  . TOBACCO ABUSE 09/30/2007  . GERD 09/30/2007  . DYSLIPIDEMIA 09/15/2007  . Hemiplegia, late effect of cerebrovascular disease ([place]) 09/15/2007     Past Medical History:  Diagnosis Date  . Anxiety   . Hyperlipidemia   . Peripheral vascular disease ([place])   . Stroke [facility]) 2009   no residual     Past Surgical History:  Procedure Laterality Date  . ABDOMINAL AORTOGRAM W/LOWER EXTREMITY [name] 06/09/2016   Procedure: Abdominal Aortogram w/Lower Extremity;  Surgeon: Serafina Mitchell, MD;  Location: [place] [facility];  Service: Cardiovascular;  Laterality: [name];  . BACK SURGERY     10 yrs ago or so-fusion in neck   . CERVICAL FUSION    . COLONOSCOPY     15-20 yrs ago-normal per pt.   . COLONOSCOPY W/ POLYPECTOMY    . CYSTOSCOPY     kidney  stools removed   . FEMORAL-POPLITEAL BYPASS GRAFT Right 07/24/2016   Procedure: RIGHT FEMORAL TO ABOVE KNEE POPLITEAL ARTERY BYPASS GRAFT;  Surgeon: Serafina Mitchell, MD;  Location: [place];  Service: Vascular;  Laterality: Right;  . LUMBAR LAMINECTOMY/DECOMPRESSION MICRODISCECTOMY Bilateral 12/03/2015   Procedure: Microdiscectomy - bilateral - Lumbar four-lumbar five;  Surgeon: Earnie Larsson, MD;  Location: [place];  Service: Neurosurgery;  Laterality: Bilateral;  . PERIPHERAL VASCULAR INTERVENTION Right 06/09/2016    Procedure: Peripheral Vascular Intervention;  Surgeon: Serafina Mitchell, MD;  Location: [place] [facility];  Service: Cardiovascular;  Laterality: Right;  SFA  . REPAIR EXTENSOR TENDON Left 10/12/2018   Procedure: TENDON SHEATH RELEASE/TENOLYSIS;  Surgeon: Leandrew Koyanagi, MD;  Location: [place];  Service: Orthopedics;  Laterality: Left;  . teeth removal       Family History  Problem Relation Age of Onset  . Colon cancer Neg Hx   . Rectal cancer Neg Hx   . Stomach cancer Neg Hx   . Esophageal cancer Neg Hx      Social History   Socioeconomic History  . Marital status: Married    Spouse name: Not on file  . Number of children: Not on file  . Years of education: Not on file  . Highest education level: Not on file  Occupational History  . Not on file  Tobacco Use  . Smoking status: Current Every Day Smoker    Packs/day: 0.50    Years: 47.00    Pack years: 23.50    Types: Cigarettes  . Smokeless tobacco: Never Used  . Tobacco comment: advised to hold for 24 hours prior  Substance and Sexual Activity  . Alcohol use: No    Alcohol/week: 0.0 standard drinks    Comment: no alcohol in 23 years (12/02/15  . Drug use: No  . Sexual activity: Not on file  Other Topics Concern  . Not on file  Social History Narrative   Wife is bedridden and he is primary caregiver---no family.   Social Determinants of Health   Financial Resource Strain:   . Difficulty of Paying Living Expenses: Not on file  Food Insecurity:   . Worried About Charity fundraiser in the Last Year: Not on file  . Ran Out of Food in the Last Year: Not on file  Transportation Needs:   . Lack of Transportation (Medical): Not on file  . Lack of Transportation (Non-Medical): Not on file  Physical Activity:   . Days of Exercise per Week: Not on file  . Minutes of Exercise per Session: Not on file  Stress:   . Feeling of Stress : Not on file  Social Connections:   . Frequency of Communication  with Friends and Family: Not on file  . Frequency of Social Gatherings with Friends and Family: Not on file  . Attends Religious Services: Not on file  . Active Member of Clubs or Organizations: Not on file  . Attends Archivist Meetings: Not on file  . Marital Status: Not on file  Intimate Partner Violence:   . Fear of Current or Ex-Partner: Not on file  . Emotionally Abused: Not on file  . Physically Abused: Not on file  . Sexually Abused: Not on file     Allergies  Allergen Reactions  . No Known Allergies      Prior to Admission medications   Medication Sig Start Date End Date Taking? Authorizing Provider  ALPRAZolam [name]) 1 MG  tablet TAKE 1/2 TO 1 (ONE-HALF TO ONE) TABLET BY MOUTH TWICE DAILY AS NEEDED FOR ANXIETY 01/13/19  Yes [name], MD  amitriptyline (ELAVIL) 50 MG tablet Take 1 tablet (50 mg total) by mouth at bedtime. 11/14/18  Yes [name], MD  clopidogrel (PLAVIX) 75 MG tablet Take 1 tablet (75 mg total) by mouth daily. 07/13/18  Yes [name], MD  fluticasone (FLONASE) 50 MCG/ACT nasal spray Place 2 sprays into both nostrils daily. 10/20/18  Yes [name], MD  ipratropium (ATROVENT) 0.06 % nasal spray Place 1-2 sprays into both nostrils 4 (four) times daily. As needed for nasal congestion 11/14/18  Yes [name], MD  lovastatin (MEVACOR) 20 MG tablet Take 1 tablet (20 mg total) by mouth at bedtime. 07/13/18   [name], MD  amLODipine (NORVASC) 5 MG tablet Take 1 tablet (5 mg total) by mouth daily. 07/13/18 10/20/18  [name], MD     Depression screen [facility] 2/9 02/09/2019 12/05/2018 10/20/2018 10/20/2018 07/13/2018  Decreased Interest 1 0 0 0 0  Down, Depressed, Hopeless 1 0 0 0 0  PHQ - 2 Score 2 0 0 0 0  Altered sleeping 2 - - - -  Tired, decreased energy 2 - - - -  Change in appetite 1 - - - -  Feeling bad or failure about yourself  0 - - - -  Trouble concentrating 1 - - - -  Moving slowly or  fidgety/restless 0 - - - -  Suicidal thoughts 0 - - - -  PHQ-9 Score 8 - - - -  Difficult doing work/chores Somewhat difficult - - - -  Some recent data might be hidden     Fall Risk  02/09/2019 12/05/2018 10/20/2018 10/20/2018 07/13/2018  Falls in the past year? 0 0 0 0 0  Number falls in past yr: 0 0 0 0 0  Injury with Fall? 0 0 0 0 0  Risk Factor Category  - - - - -  Follow up Falls evaluation completed;Education provided Falls evaluation completed - Falls evaluation completed Falls evaluation completed      PHYSICAL EXAM: BP 130/80 Comment: per patient taken at [facility]  Ht 5\' 9"  (1.753 m)   Wt 142 lb (64.4 kg) Comment: taken from previous visit  BMI 20.97 kg/m    Wt Readings from Last 3 Encounters:  02/09/19 142 lb (64.4 kg)  10/26/18 142 lb (64.4 kg)  10/20/18 142 lb (64.4 kg)       Education/Counseling provided regarding diet and exercise, prevention of chronic diseases, smoking/tobacco cessation, if applicable, and reviewed "Covered Medicare Preventive Services."

## 2019-02-09 NOTE — Patient Instructions (Signed)
Thank you for taking time to come for your Medicare Wellness Visit. I appreciate your ongoing commitment to your health goals. Please review the following plan we discussed and let me know if I can assist you in the future.  Roy Kennedy LPN  Preventive Care 63-63 Years Old, Male Preventive care refers to lifestyle choices and visits with your health care provider that can promote health and wellness. This includes:  A yearly physical exam. This is also called an annual well check.  Regular dental and eye exams.  Immunizations.  Screening for certain conditions.  Healthy lifestyle choices, such as eating a healthy diet, getting regular exercise, not using drugs or products that contain nicotine and tobacco, and limiting alcohol use. What can I expect for my preventive care visit? Physical exam Your health care provider will check:  Height and weight. These may be used to calculate body mass index (BMI), which is a measurement that tells if you are at a healthy weight.  Heart rate and blood pressure.  Your skin for abnormal spots. Counseling Your health care provider may ask you questions about:  Alcohol, tobacco, and drug use.  Emotional well-being.  Home and relationship well-being.  Sexual activity.  Eating habits.  Work and work Statistician. What immunizations do I need?  Influenza (flu) vaccine  This is recommended every year. Tetanus, diphtheria, and pertussis (Tdap) vaccine  You may need a Td booster every 10 years. Varicella (chickenpox) vaccine  You may need this vaccine if you have not already been vaccinated. Zoster (shingles) vaccine  You may need this after age 63. Measles, mumps, and rubella (MMR) vaccine  You may need at least one dose of MMR if you were born in 1957 or later. You may also need a second dose. Pneumococcal conjugate (PCV13) vaccine  You may need this if you have certain conditions and were not previously vaccinated. Pneumococcal  polysaccharide (PPSV23) vaccine  You may need one or two doses if you smoke cigarettes or if you have certain conditions. Meningococcal conjugate (MenACWY) vaccine  You may need this if you have certain conditions. Hepatitis A vaccine  You may need this if you have certain conditions or if you travel or work in places where you may be exposed to hepatitis A. Hepatitis B vaccine  You may need this if you have certain conditions or if you travel or work in places where you may be exposed to hepatitis B. Haemophilus influenzae type b (Hib) vaccine  You may need this if you have certain risk factors. Human papillomavirus (HPV) vaccine  If recommended by your health care provider, you may need three doses over 6 months. You may receive vaccines as individual doses or as more than one vaccine together in one shot (combination vaccines). Talk with your health care provider about the risks and benefits of combination vaccines. What tests do I need? Blood tests  Lipid and cholesterol levels. These may be checked every 5 years, or more frequently if you are over 63 years old.  Hepatitis C test.  Hepatitis B test. Screening  Lung cancer screening. You may have this screening every year starting at age 63 if you have a 30-pack-year history of smoking and currently smoke or have quit within the past 15 years.  Prostate cancer screening. Recommendations will vary depending on your family history and other risks.  Colorectal cancer screening. All adults should have this screening starting at age 63 and continuing until age 83. Your health care provider may  recommend screening at age 63 if you are at increased risk. You will have tests every 1-10 years, depending on your results and the type of screening test.  Diabetes screening. This is done by checking your blood sugar (glucose) after you have not eaten for a while (fasting). You may have this done every 1-3 years.  Sexually transmitted  disease (STD) testing. Follow these instructions at home: Eating and drinking  Eat a diet that includes fresh fruits and vegetables, whole grains, lean protein, and low-fat dairy products.  Take vitamin and mineral supplements as recommended by your health care provider.  Do not drink alcohol if your health care provider tells you not to drink.  If you drink alcohol: ? Limit how much you have to 0-2 drinks a day. ? Be aware of how much alcohol is in your drink. In the [place] one drink equals one 12 oz bottle of beer (355 mL), one 5 oz glass of wine (148 mL), or one 1 oz glass of hard liquor (44 mL). Lifestyle  Take daily care of your teeth and gums.  Stay active. Exercise for at least 30 minutes on 5 or more days each week.  Do not use any products that contain nicotine or tobacco, such as cigarettes, e-cigarettes, and chewing tobacco. If you need help quitting, ask your health care provider.  If you are sexually active, practice safe sex. Use a condom or other form of protection to prevent STIs (sexually transmitted infections).  Talk with your health care provider about taking a low-dose aspirin every day starting at age 63. What's next?  Go to your health care provider once a year for a well check visit.  Ask your health care provider how often you should have your eyes and teeth checked.  Stay up to date on all vaccines. This information is not intended to replace advice given to you by your health care provider. Make sure you discuss any questions you have with your health care provider. Document Revised: 01/06/2018 Document Reviewed: 01/06/2018 Elsevier Patient Education  2020 [employer].

## 2019-02-10 MED ORDER — ALPRAZOLAM 1 MG PO TABS: ORAL_TABLET | ORAL | 0 refills | Status: DC

## 2019-02-10 NOTE — Addendum Note (Signed)
Addended by: [name] R on: 02/10/2019 04:19 PM   Modules accepted: Orders

## 2019-02-10 NOTE — Telephone Encounter (Signed)
Refill ordered for now.

## 2019-02-10 NOTE — Telephone Encounter (Signed)
Please see last refill request message in December.  What is the status of his psychiatry referral?  Controlled substance database (PDMP) reviewed. No concerns appreciated. Last filled 01/14/19 for #60.

## 2019-03-06 ENCOUNTER — Ambulatory Visit (INDEPENDENT_AMBULATORY_CARE_PROVIDER_SITE_OTHER): Payer: Medicare HMO | Admitting: Family Medicine

## 2019-03-06 ENCOUNTER — Encounter: Payer: Self-pay | Admitting: Family Medicine

## 2019-03-06 ENCOUNTER — Other Ambulatory Visit: Payer: Self-pay

## 2019-03-06 VITALS — BP 150/76 | HR 84 | Temp 98.7°F | Ht 69.0 in | Wt 134.0 lb

## 2019-03-06 DIAGNOSIS — Z5181 Encounter for therapeutic drug level monitoring: Secondary | ICD-10-CM | POA: Diagnosis not present

## 2019-03-06 DIAGNOSIS — Z23 Encounter for immunization: Secondary | ICD-10-CM

## 2019-03-06 DIAGNOSIS — R69 Illness, unspecified: Secondary | ICD-10-CM | POA: Diagnosis not present

## 2019-03-06 DIAGNOSIS — F411 Generalized anxiety disorder: Secondary | ICD-10-CM | POA: Diagnosis not present

## 2019-03-06 DIAGNOSIS — I10 Essential (primary) hypertension: Secondary | ICD-10-CM

## 2019-03-06 MED ORDER — AMLODIPINE BESYLATE 10 MG PO TABS: 10.0000 mg | ORAL_TABLET | Freq: Every day | ORAL | 1 refills | Status: DC

## 2019-03-06 MED ORDER — SHINGRIX 50 MCG/0.5ML IM SUSR: 0.5000 mL | Freq: Once | INTRAMUSCULAR | 1 refills | Status: AC

## 2019-03-06 NOTE — Patient Instructions (Addendum)
It appears the previous notes from psychiatry indicate question about face-to-face visit.  If you are okay meeting with them at this time, including a possible virtual appointment, here is their number.   [facility].  [phone]  Let me know right away if you have any difficulty calling that office or scheduling that appointment.  I sent the shingles vaccine to pharmacy.  Increase amlodipine to 10 mg daily as that should help blood pressure a little bit better.   Recheck in 6 weeks for blood pressure.   Return to the clinic or go to the nearest emergency room if any of your symptoms worsen or new symptoms occur.   If you have lab work done today you will be contacted with your lab results within the next 2 weeks.  If you have not heard from [place] then please contact us. The fastest way to get your results is to register for My Chart.   IF you received an x-ray today, you will receive an invoice from [facility]. Please contact [facility] at [phone] with questions or concerns regarding your invoice.   IF you received labwork today, you will receive an invoice from [place]. Please contact [facility] at [phone] with questions or concerns regarding your invoice.   Our billing staff will not be able to assist you with questions regarding bills from these companies.  You will be contacted with the lab results as soon as they are available. The fastest way to get your results is to activate your My Chart account. Instructions are located on the last page of this paperwork. If you have not heard from [place] regarding the results in 2 weeks, please contact this office.

## 2019-03-06 NOTE — Progress Notes (Signed)
Subjective:  Patient ID: Roy Moore, male    DOB: 1955/11/13  Age: 64 y.o. MRN: [id number]  CC:  Chief Complaint  Patient presents with  . Follow-up    3 month follow-up on hypertention. also go over the labs from last visit. pt states he hasn't had any issues with his hypertention. pt states his medication is working well with no side effects.  . Depression    PHQ9- score of 12. pt's wife is in a nursing home.    HPI Roy Moore presents for   Hypertension: Last discussed by telemedicine November 9.  He was taking amlodipine 7.5 mg daily at that time. Taking same dose of amlodipine - 7.92m qd. No missed doses.  Home readings: no recent readings.   BP Readings from Last 3 Encounters:  03/06/19 (!) 150/76  02/09/19 130/80  10/20/18 (!) 180/76   Lab Results  Component Value Date   CREATININE 1.37 (H) 07/13/2018    Generalized anxiety disorder with insomnia: See previous notes.  We discussed psychiatry follow-up to look into other options as he has remained on Xanax 1 mg twice daily only.  Tried SSRI with low-dose Zoloft and no change in symptoms with reported use for 3 months.  He was declined to meet with counselor or therapist previously and declined trying SSRI again.  Referral was placed last September to try psychiatric and counseling.  He was continued on Elavil for sleep as well.  Has reported control of anxiety with alprazolam.  Controlled substance database reviewed without listings, however most recent prescription for alprazolam 1 mg #60 prescribed January 15. [facility] early January, [facility]  She has been on quarantine.   Has not met with therapist.  Feels a little more anxious, more depressed past month.  Using alprazolam BID. [facility]  No IDU, last used marijuana 10 years ago.  Has been around other that smoke marijuana.   Psychiatrist never called. Last saw psychiatry in 2009, one time  assessment for disability. No ongoing follow up with psychiatry.  Refuses SSRI.  Refuses meeting with therapist. Next xanax refill needed on the 18th.   Per patient he never received a call back from psychiatry.  Based on note September 24, it appears that the request was made to be seen in a location that was providing face-to-face evaluation as visits were virtual at that time.  Patient states this is not the case and he was waiting on a call back for other appointment.   [facility] 03/06/2019 03/06/2019 02/09/2019 12/05/2018 10/20/2018  Decreased Interest - 0 1 0 0  Down, Depressed, Hopeless 2 1 1  0 0  PHQ - 2 Score 2 1 2  0 0  Altered sleeping 2 - 2 - -  Tired, decreased energy 2 - 2 - -  Change in appetite 2 - 1 - -  Feeling bad or failure about yourself  2 - 0 - -  Trouble concentrating 0 - 1 - -  Moving slowly or fidgety/restless 2 - 0 - -  Suicidal thoughts 0 - 0 - -  PHQ-9 Score 12 - 8 - -  Difficult doing work/chores - - Somewhat difficult - -  Some recent data might be hidden     History Patient Active Problem List   Diagnosis Date Noted  . Extensor tenosynovitis of left wrist 09/20/2018  . PAD (peripheral artery disease) ([place] 07/24/2016  . Peripheral  arterial disease ([place]) 07/17/2016  . Lumbar stenosis with neurogenic claudication 12/03/2015  . CVA, old, hemiparesis ([place]) 03/24/2011  . ANXIETY DISORDER 10/04/2007  . TOBACCO ABUSE 09/30/2007  . GERD 09/30/2007  . DYSLIPIDEMIA 09/15/2007  . Hemiplegia, late effect of cerebrovascular disease ([place]) 09/15/2007   Past Medical History:  Diagnosis Date  . Anxiety   . Hyperlipidemia   . Peripheral vascular disease ([place])   . Stroke [facility]) 2009   no residual   Past Surgical History:  Procedure Laterality Date  . ABDOMINAL AORTOGRAM W/LOWER EXTREMITY [name] 06/09/2016   Procedure: Abdominal Aortogram w/Lower Extremity;  Surgeon: Serafina Mitchell, MD;  Location: [place] [facility];  Service: Cardiovascular;   Laterality: [name];  . BACK SURGERY     10 yrs ago or so-fusion in neck   . CERVICAL FUSION    . COLONOSCOPY     15-20 yrs ago-normal per pt.   . COLONOSCOPY W/ POLYPECTOMY    . CYSTOSCOPY     kidney stools removed   . FEMORAL-POPLITEAL BYPASS GRAFT Right 07/24/2016   Procedure: RIGHT FEMORAL TO ABOVE KNEE POPLITEAL ARTERY BYPASS GRAFT;  Surgeon: Serafina Mitchell, MD;  Location: [place];  Service: Vascular;  Laterality: Right;  . LUMBAR LAMINECTOMY/DECOMPRESSION MICRODISCECTOMY Bilateral 12/03/2015   Procedure: Microdiscectomy - bilateral - Lumbar four-lumbar five;  Surgeon: Earnie Larsson, MD;  Location: [place];  Service: Neurosurgery;  Laterality: Bilateral;  . PERIPHERAL VASCULAR INTERVENTION Right 06/09/2016   Procedure: Peripheral Vascular Intervention;  Surgeon: Serafina Mitchell, MD;  Location: [place] [facility];  Service: Cardiovascular;  Laterality: Right;  SFA  . REPAIR EXTENSOR TENDON Left 10/12/2018   Procedure: TENDON SHEATH RELEASE/TENOLYSIS;  Surgeon: Leandrew Koyanagi, MD;  Location: [place];  Service: Orthopedics;  Laterality: Left;  . teeth removal     Allergies  Allergen Reactions  . No Known Allergies    Prior to Admission medications   Medication Sig Start Date End Date Taking? Authorizing Provider  ALPRAZolam (XANAX) 1 MG tablet TAKE 1/2 TO 1 (ONE-HALF TO ONE) TABLET BY MOUTH TWICE DAILY AS NEEDED FOR ANXIETY 02/10/19  Yes [name], MD  amitriptyline (ELAVIL) 50 MG tablet Take 1 tablet (50 mg total) by mouth at bedtime. 11/14/18  Yes [name], MD  clopidogrel (PLAVIX) 75 MG tablet Take 1 tablet (75 mg total) by mouth daily. 07/13/18  Yes [name], MD  lovastatin (MEVACOR) 20 MG tablet Take 1 tablet (20 mg total) by mouth at bedtime. 07/13/18  Yes [name], MD  fluticasone (FLONASE) 50 MCG/ACT nasal spray Place 2 sprays into both nostrils daily. Patient not taking: Reported on 03/06/2019 10/20/18   [name], MD  ipratropium  (ATROVENT) 0.06 % nasal spray Place 1-2 sprays into both nostrils 4 (four) times daily. As needed for nasal congestion Patient not taking: Reported on 03/06/2019 11/14/18   [name], MD  amLODipine (NORVASC) 5 MG tablet Take 1 tablet (5 mg total) by mouth daily. 07/13/18 10/20/18  [name], MD   Social History   Socioeconomic History  . Marital status: Married    Spouse name: Not on file  . Number of children: Not on file  . Years of education: Not on file  . Highest education level: Not on file  Occupational History  . Not on file  Tobacco Use  . Smoking status: Current Every Day Smoker    Packs/day: 0.50    Years: 47.00  Pack years: 23.50    Types: Cigarettes  . Smokeless tobacco: Never Used  . Tobacco comment: advised to hold for 24 hours prior  Substance and Sexual Activity  . Alcohol use: No    Alcohol/week: 0.0 standard drinks    Comment: no alcohol in 23 years (12/02/15  . Drug use: No  . Sexual activity: Not on file  Other Topics Concern  . Not on file  Social History Narrative   Wife is bedridden and he is primary caregiver---no family.   Social Determinants of Health   Financial Resource Strain:   . Difficulty of Paying Living Expenses: Not on file  Food Insecurity:   . Worried About Charity fundraiser in the Last Year: Not on file  . Ran Out of Food in the Last Year: Not on file  Transportation Needs:   . Lack of Transportation (Medical): Not on file  . Lack of Transportation (Non-Medical): Not on file  Physical Activity:   . Days of Exercise per Week: Not on file  . Minutes of Exercise per Session: Not on file  Stress:   . Feeling of Stress : Not on file  Social Connections:   . Frequency of Communication with Friends and Family: Not on file  . Frequency of Social Gatherings with Friends and Family: Not on file  . Attends Religious Services: Not on file  . Active Member of Clubs or Organizations: Not on file  . Attends Theatre manager Meetings: Not on file  . Marital Status: Not on file  Intimate Partner Violence:   . Fear of Current or Ex-Partner: Not on file  . Emotionally Abused: Not on file  . Physically Abused: Not on file  . Sexually Abused: Not on file    Review of Systems  Constitutional: Negative for fatigue and unexpected weight change.  Eyes: Negative for visual disturbance.  Respiratory: Negative for cough, chest tightness and shortness of breath.   Cardiovascular: Negative for chest pain, palpitations and leg swelling.  Gastrointestinal: Negative for abdominal pain and blood in stool.  Neurological: Negative for dizziness, light-headedness and headaches.     Objective:   Vitals:   03/06/19 1639 03/06/19 1706  BP: (!) 158/78 (!) 150/76  Pulse: 84   Temp: 98.7 F (37.1 C)   TempSrc: Temporal   SpO2: 99%   Weight: 134 lb (60.8 kg)   Height: 5' 9"  (1.753 m)      Physical Exam Vitals reviewed.  Constitutional:      Appearance: He is well-developed.  HENT:     Head: Normocephalic and atraumatic.  Eyes:     Pupils: Pupils are equal, round, and reactive to light.  Neck:     Vascular: No carotid bruit or JVD.  Cardiovascular:     Rate and Rhythm: Normal rate and regular rhythm.     Heart sounds: Normal heart sounds. No murmur.  Pulmonary:     Effort: Pulmonary effort is normal.     Breath sounds: Normal breath sounds. No rales.  Skin:    General: Skin is warm and dry.  Neurological:     Mental Status: He is alert and oriented to person, place, and time.  Psychiatric:        Attention and Perception: Attention normal.        Mood and Affect: Mood is anxious (at times during visit. ).        Speech: Speech normal.        Behavior: Behavior normal.  Behavior is cooperative.        Thought Content: Thought content does not include homicidal or suicidal ideation. Thought content does not include homicidal or suicidal plan.    Insufficient quantity of urine for urine testing  in office today - will return tomorrow.  Assessment & Plan:  Roy Moore is a 64 y.o. male . Essential hypertension - Plan: Basic metabolic panel, amLODipine (NORVASC) 10 MG tablet  -Decreased control, increase amlodipine to 10 mg daily, potential effects have been discussed.  Check labs.  Recheck 6 weeks  Generalized anxiety disorder - Plan: CANCELED: ToxASSURE Select 13 (MW), Urine Medication monitoring encounter - Plan: CANCELED: ToxASSURE Select 13 (MW), Urine  -Discussed again importance of psychiatry follow-up, given monotherapy with benzodiazepine.  Increase PHQ score with some depression, increased anxiety symptoms likely component of adjustment disorder with his wife's chronic illness.  Counseling was again discussed/recommended but declined.  We have also discussed on previous visits as well as today potential benefit of SSRI and potential vas need for benzodiazepine but that was also declined.  -Phone number provided for psychiatry which he states he will call tomorrow to arrange appointment  -UDS plan as above  Need for shingles vaccine - Plan: Zoster Vaccine Adjuvanted [facility]) injection sent to pharmacy.   No orders of the defined types were placed in this encounter.  Patient Instructions       If you have lab work done today you will be contacted with your lab results within the next 2 weeks.  If you have not heard from [place] then please contact us. The fastest way to get your results is to register for My Chart.   IF you received an x-ray today, you will receive an invoice from [facility]. Please contact [facility] at [phone] with questions or concerns regarding your invoice.   IF you received labwork today, you will receive an invoice from [place]. Please contact [facility] at [phone] with questions or concerns regarding your invoice.   Our billing staff will not be able to assist you with questions regarding bills from these  companies.  You will be contacted with the lab results as soon as they are available. The fastest way to get your results is to activate your My Chart account. Instructions are located on the last page of this paperwork. If you have not heard from [place] regarding the results in 2 weeks, please contact this office.         Signed, [name], MD [facility]

## 2019-03-07 ENCOUNTER — Telehealth: Payer: Self-pay | Admitting: Family Medicine

## 2019-03-07 ENCOUNTER — Ambulatory Visit: Payer: Medicare HMO | Admitting: Family Medicine

## 2019-03-07 ENCOUNTER — Ambulatory Visit: Payer: Medicare HMO

## 2019-03-07 LAB — BASIC METABOLIC PANEL
BUN/Creatinine Ratio: 13 (ref 10–24)
BUN: 15 mg/dL (ref 8–27)
CO2: 24 mmol/L (ref 20–29)
Calcium: 9.5 mg/dL (ref 8.6–10.2)
Chloride: 105 mmol/L (ref 96–106)
Creatinine, Ser: 1.17 mg/dL (ref 0.76–1.27)
GFR calc Af Amer: 77 mL/min/{1.73_m2} (ref >59)
GFR calc non Af Amer: 66 mL/min/{1.73_m2} (ref >59)
Glucose: 110 mg/dL — ABNORMAL HIGH (ref 65–99)
Potassium: 4.3 mmol/L (ref 3.5–5.2)
Sodium: 141 mmol/L (ref 134–144)

## 2019-03-07 NOTE — Telephone Encounter (Signed)
FYI-Pt has a psych appointment on 05/25/19 with Dr Ysidro Evert.

## 2019-03-07 NOTE — Telephone Encounter (Signed)
Pt rescheduled nurse visit until 03/13/2019 at 3:20 pm for urine toxassure.  Per pt missed appt today as he went to [facility] to get his shingle shot and his truck broke down and his brother who lives in [place] be able to fix it until Friday, he says it the serpentine belt that broke.  Per pt he has a psych appt with Dr. Jobie Quaker on 05/25/2019. I advised pt I will relay message to Dr. Carlota Raspberry.

## 2019-03-07 NOTE — Telephone Encounter (Signed)
Tried contacting pt via phone and no answer and no voicemail to leave a message.  Pt has nurse visit this afternoon here in office and will try to connect with him then to get info he has for dr Carlota Raspberry.

## 2019-03-07 NOTE — Telephone Encounter (Unsigned)
Copied from [place] [phone]. Topic: General - Other >> Mar 07, 2019 11:05 AM Yvette Rack wrote: Reason for CRM: Pt stated he needs to speak with Dr. Carlota Raspberry or his nurse. Pt did not provide any further details. Pt stated he has some information that he needs to give them

## 2019-03-07 NOTE — Telephone Encounter (Signed)
Tried contacting this pt twice via phone- no answer and no vm to leave message.  Pt has nurse visit this afternoon at 4pm will try to connect with him then to see what information he has for dr Carlota Raspberry.

## 2019-03-07 NOTE — Telephone Encounter (Signed)
t wants dr Carlota Raspberry to know he made appt with another dr as he had suggested.  Pt states he needs to speak with Dr Vonna Kotyk nurse today to relay this message.

## 2019-03-08 NOTE — Telephone Encounter (Signed)
Tried contacting pt and no answer and no voicemail.  Will try again later.

## 2019-03-08 NOTE — Telephone Encounter (Signed)
Noted. Please advise him that he will need to obtain the urine test at some point.  I will call him and let him know a time to come in.

## 2019-03-09 NOTE — Telephone Encounter (Signed)
Tried contacting pt no answer and no vm to leave a message.  Will try again later.

## 2019-03-10 NOTE — Progress Notes (Deleted)
Office Visit Note  Patient: Roy Moore             Date of Birth: 11/22/1955           MRN: [id number]             PCP: [name], MD Referring: Leandrew Koyanagi, MD Visit Date: 03/16/2019 Occupation: @GUAROCC @  Subjective:  No chief complaint on file.   History of Present Illness: Roy Moore is a 64 y.o. male ***   Activities of Daily Living:  Patient reports morning stiffness for *** {minute/hour:19697}.   Patient {ACTIONS;DENIES/REPORTS:21021675::"Denies"} nocturnal pain.  Difficulty dressing/grooming: {ACTIONS;DENIES/REPORTS:21021675::"Denies"} Difficulty climbing stairs: {ACTIONS;DENIES/REPORTS:21021675::"Denies"} Difficulty getting out of chair: {ACTIONS;DENIES/REPORTS:21021675::"Denies"} Difficulty using hands for taps, buttons, cutlery, and/or writing: {ACTIONS;DENIES/REPORTS:21021675::"Denies"}  No Rheumatology ROS completed.   PMFS History:  Patient Active Problem List   Diagnosis Date Noted  . Extensor tenosynovitis of left wrist 09/20/2018  . PAD (peripheral artery disease) ([place]) 07/24/2016  . Peripheral arterial disease ([place]) 07/17/2016  . Lumbar stenosis with neurogenic claudication 12/03/2015  . CVA, old, hemiparesis ([place]) 03/24/2011  . ANXIETY DISORDER 10/04/2007  . TOBACCO ABUSE 09/30/2007  . GERD 09/30/2007  . DYSLIPIDEMIA 09/15/2007  . Hemiplegia, late effect of cerebrovascular disease ([place]) 09/15/2007    Past Medical History:  Diagnosis Date  . Anxiety   . Hyperlipidemia   . Peripheral vascular disease ([place])   . Stroke [facility]) 2009   no residual    Family History  Problem Relation Age of Onset  . Colon cancer Neg Hx   . Rectal cancer Neg Hx   . Stomach cancer Neg Hx   . Esophageal cancer Neg Hx    Past Surgical History:  Procedure Laterality Date  . ABDOMINAL AORTOGRAM W/LOWER EXTREMITY [name] 06/09/2016   Procedure: Abdominal Aortogram w/Lower Extremity;  Surgeon: Serafina Mitchell, MD;  Location: [place] [facility];  Service:  Cardiovascular;  Laterality: [name];  . BACK SURGERY     10 yrs ago or so-fusion in neck   . CERVICAL FUSION    . COLONOSCOPY     15-20 yrs ago-normal per pt.   . COLONOSCOPY W/ POLYPECTOMY    . CYSTOSCOPY     kidney stools removed   . FEMORAL-POPLITEAL BYPASS GRAFT Right 07/24/2016   Procedure: RIGHT FEMORAL TO ABOVE KNEE POPLITEAL ARTERY BYPASS GRAFT;  Surgeon: Serafina Mitchell, MD;  Location: [place];  Service: Vascular;  Laterality: Right;  . LUMBAR LAMINECTOMY/DECOMPRESSION MICRODISCECTOMY Bilateral 12/03/2015   Procedure: Microdiscectomy - bilateral - Lumbar four-lumbar five;  Surgeon: Earnie Larsson, MD;  Location: [place];  Service: Neurosurgery;  Laterality: Bilateral;  . PERIPHERAL VASCULAR INTERVENTION Right 06/09/2016   Procedure: Peripheral Vascular Intervention;  Surgeon: Serafina Mitchell, MD;  Location: [place] [facility];  Service: Cardiovascular;  Laterality: Right;  SFA  . REPAIR EXTENSOR TENDON Left 10/12/2018   Procedure: TENDON SHEATH RELEASE/TENOLYSIS;  Surgeon: Leandrew Koyanagi, MD;  Location: [place];  Service: Orthopedics;  Laterality: Left;  . teeth removal     Social History   Social History Narrative   Wife is bedridden and he is primary caregiver---no family.   Immunization History  Administered Date(s) Administered  . Tdap 01/21/2014     Objective: Vital Signs: There were no vitals taken for this visit.   Physical Exam   Musculoskeletal Exam: ***  CDAI Exam: CDAI Score: - Patient Global: -; Provider Global: - Swollen: -; Tender: - Joint Exam 03/16/2019   No joint  exam has been documented for this visit   There is currently no information documented on the homunculus. Go to the Rheumatology activity and complete the homunculus joint exam.  Investigation: No additional findings.  Imaging: No results found.  Recent Labs: Lab Results  Component Value Date   WBC 12.8 (H) 12/08/2016   HGB 12.6 (L) 12/08/2016   PLT 345 12/08/2016   NA  141 03/06/2019   K 4.3 03/06/2019   CL 105 03/06/2019   CO2 24 03/06/2019   GLUCOSE 110 (H) 03/06/2019   BUN 15 03/06/2019   CREATININE 1.17 03/06/2019   BILITOT 0.3 07/13/2018   ALKPHOS 82 07/13/2018   AST 16 07/13/2018   ALT 10 07/13/2018   PROT 6.7 07/13/2018   ALBUMIN 4.0 07/13/2018   CALCIUM 9.5 03/06/2019   GFRAA 77 03/06/2019    Speciality Comments: No specialty comments available.  Procedures:  No procedures performed Allergies: No known allergies   Assessment / Plan:     Visit Diagnoses: No diagnosis found.  Orders: No orders of the defined types were placed in this encounter.  No orders of the defined types were placed in this encounter.   Face-to-face time spent with patient was *** minutes. Greater than 50% of time was spent in counseling and coordination of care.  Follow-Up Instructions: No follow-ups on file.   Ofilia Neas, PA-C  Note - This record has been created using Dragon software.  Chart creation errors have been sought, but may not always  have been located. Such creation errors do not reflect on  the standard of medical care.

## 2019-03-13 ENCOUNTER — Other Ambulatory Visit: Payer: Self-pay

## 2019-03-13 ENCOUNTER — Encounter: Payer: Self-pay | Admitting: Family Medicine

## 2019-03-13 ENCOUNTER — Ambulatory Visit (INDEPENDENT_AMBULATORY_CARE_PROVIDER_SITE_OTHER): Payer: Medicare HMO | Admitting: Family Medicine

## 2019-03-13 VITALS — BP 174/78 | HR 84 | Temp 98.3°F | Ht 69.0 in | Wt 141.0 lb

## 2019-03-13 DIAGNOSIS — I10 Essential (primary) hypertension: Secondary | ICD-10-CM | POA: Diagnosis not present

## 2019-03-13 DIAGNOSIS — Z5181 Encounter for therapeutic drug level monitoring: Secondary | ICD-10-CM

## 2019-03-13 DIAGNOSIS — F17210 Nicotine dependence, cigarettes, uncomplicated: Secondary | ICD-10-CM

## 2019-03-13 DIAGNOSIS — F411 Generalized anxiety disorder: Secondary | ICD-10-CM | POA: Diagnosis not present

## 2019-03-13 DIAGNOSIS — I739 Peripheral vascular disease, unspecified: Secondary | ICD-10-CM | POA: Diagnosis not present

## 2019-03-13 DIAGNOSIS — G4700 Insomnia, unspecified: Secondary | ICD-10-CM

## 2019-03-13 DIAGNOSIS — R69 Illness, unspecified: Secondary | ICD-10-CM | POA: Diagnosis not present

## 2019-03-13 MED ORDER — RAMIPRIL 2.5 MG PO CAPS: 2.5000 mg | ORAL_CAPSULE | Freq: Every day | ORAL | 1 refills | Status: DC

## 2019-03-13 MED ORDER — ALPRAZOLAM 1 MG PO TABS: ORAL_TABLET | ORAL | 0 refills | Status: DC

## 2019-03-13 NOTE — Patient Instructions (Addendum)
Add ramipril once per day, continue amlodipine 10mg  per day. recheck in 1 month. let me know if I can help with your quitting smoking.  Keep a record of your blood pressures outside of the office and bring them to the next office visit.  Return to the clinic or go to the nearest emergency room if any of your symptoms worsen or new symptoms occur.   Steps to Quit Smoking Smoking tobacco is the leading cause of preventable death. It can affect almost every organ in the body. Smoking puts you and those around you at risk for developing many serious chronic diseases. Quitting smoking can be difficult, but it is one of the best things that you can do for your health. It is never too late to quit. How do I get ready to quit? When you decide to quit smoking, create a plan to help you succeed. Before you quit:  Pick a date to quit. Set a date within the next 2 weeks to give you time to prepare.  Write down the reasons why you are quitting. Keep this list in places where you will see it often.  Tell your family, friends, and co-workers that you are quitting. Support from your loved ones can make quitting easier.  Talk with your health care provider about your options for quitting smoking.  Find out what treatment options are covered by your health insurance.  Identify people, places, things, and activities that make you want to smoke (triggers). Avoid them. What first steps can I take to quit smoking?  Throw away all cigarettes at home, at work, and in your car.  Throw away smoking accessories, such as Scientist, research (medical).  Clean your car. Make sure to empty the ashtray.  Clean your home, including curtains and carpets. What strategies can I use to quit smoking? Talk with your health care provider about combining strategies, such as taking medicines while you are also receiving in-person counseling. Using these two strategies together makes you more likely to succeed in quitting than if  you used either strategy on its own.  If you are pregnant or breastfeeding, talk with your health care provider about finding counseling or other support strategies to quit smoking. Do not take medicine to help you quit smoking unless your health care provider tells you to do so. To quit smoking: Quit right away  Quit smoking completely, instead of gradually reducing how much you smoke over a period of time. Research shows that stopping smoking right away is more successful than gradually quitting.  Attend in-person counseling to help you build problem-solving skills. You are more likely to succeed in quitting if you attend counseling sessions regularly. Even short sessions of 10 minutes can be effective. Take medicine You may take medicines to help you quit smoking. Some medicines require a prescription and some you can purchase over-the-counter. Medicines may have nicotine in them to replace the nicotine in cigarettes. Medicines may:  Help to stop cravings.  Help to relieve withdrawal symptoms. Your health care provider may recommend:  Nicotine patches, gum, or lozenges.  Nicotine inhalers or sprays.  Non-nicotine medicine that is taken by mouth. Find resources Find resources and support systems that can help you to quit smoking and remain smoke-free after you quit. These resources are most helpful when you use them often. They include:  Online chats with a Social worker.  Telephone quitlines.  Printed Furniture conservator/restorer.  Support groups or group counseling.  Text messaging programs.  Mobile phone apps  or applications. Use apps that can help you stick to your quit plan by providing reminders, tips, and encouragement. There are many free apps for mobile devices as well as websites. Examples include Quit Guide from the [employer] and smokefree.gov What things can I do to make it easier to quit?   Reach out to your family and friends for support and encouragement. Call telephone quitlines  (1-800-QUIT-NOW), reach out to support groups, or work with a counselor for support.  Ask people who smoke to avoid smoking around you.  Avoid places that trigger you to smoke, such as bars, parties, or smoke-break areas at work.  Spend time with people who do not smoke.  Lessen the stress in your life. Stress can be a smoking trigger for some people. To lessen stress, try: ? Exercising regularly. ? Doing deep-breathing exercises. ? Doing yoga. ? Meditating. ? Performing a body scan. This involves closing your eyes, scanning your body from head to toe, and noticing which parts of your body are particularly tense. Try to relax the muscles in those areas. How will I feel when I quit smoking? Day 1 to 3 weeks Within the first 24 hours of quitting smoking, you may start to feel withdrawal symptoms. These symptoms are usually most noticeable 2-3 days after quitting, but they usually do not last for more than 2-3 weeks. You may experience these symptoms:  Mood swings.  Restlessness, anxiety, or irritability.  Trouble concentrating.  Dizziness.  Strong cravings for sugary foods and nicotine.  Mild weight gain.  Constipation.  Nausea.  Coughing or a sore throat.  Changes in how the medicines that you take for unrelated issues work in your body.  Depression.  Trouble sleeping (insomnia). Week 3 and afterward After the first 2-3 weeks of quitting, you may start to notice more positive results, such as:  Improved sense of smell and taste.  Decreased coughing and sore throat.  Slower heart rate.  Lower blood pressure.  Clearer skin.  The ability to breathe more easily.  Fewer sick days. Quitting smoking can be very challenging. Do not get discouraged if you are not successful the first time. Some people need to make many attempts to quit before they achieve long-term success. Do your best to stick to your quit plan, and talk with your health care provider if you have any  questions or concerns. Summary  Smoking tobacco is the leading cause of preventable death. Quitting smoking is one of the best things that you can do for your health.  When you decide to quit smoking, create a plan to help you succeed.  Quit smoking right away, not slowly over a period of time.  When you start quitting, seek help from your health care provider, family, or friends. This information is not intended to replace advice given to you by your health care provider. Make sure you discuss any questions you have with your health care provider. Document Revised: 10/07/2018 Document Reviewed: 04/02/2018 Elsevier Patient Education  [employer].    If you have lab work done today you will be contacted with your lab results within the next 2 weeks.  If you have not heard from [place] then please contact us. The fastest way to get your results is to register for My Chart.   IF you received an x-ray today, you will receive an invoice from [facility]. Please contact [facility] at [phone] with questions or concerns regarding your invoice.   IF you received labwork today,  you will receive an invoice from [facility]. Please contact [facility] at [phone] with questions or concerns regarding your invoice.   Our billing staff will not be able to assist you with questions regarding bills from these companies.  You will be contacted with the lab results as soon as they are available. The fastest way to get your results is to activate your My Chart account. Instructions are located on the last page of this paperwork. If you have not heard from [place] regarding the results in 2 weeks, please contact this office.

## 2019-03-13 NOTE — Progress Notes (Signed)
Subjective:  Patient ID: Roy Moore, male    DOB: 23-Nov-1955  Age: 64 y.o. MRN: [id number]  CC:  Chief Complaint  Patient presents with  . Follow-up    on hypertension pt does not take BP at home accasionally takes one at [facility] but not consistent. pt reports no physical symptoms. no side effects from new medication    HPI Roy Moore presents for   Hypertension: Decreased control last visit February 8.  Amlodipine was increased to 10 mg daily, up from 7.5 mg. Taking 10mg  per day.  History of PVD - R fempop bypass 2018. continues to smoke, but cutting back.  No outside readings recently. No new supplements.  No alcohol in 25 years. No caffeine.  Has not missed any xanax doses - taking 1 pill 2 times per day. Denies any IDU.  No added salt to food. Trying to eat healthy. Baked food.  Home readings: BP Readings from Last 3 Encounters:  03/13/19 (!) 174/78  03/06/19 (!) 150/76  02/09/19 130/80   Lab Results  Component Value Date   CREATININE 1.17 03/06/2019   Nicotine addiction: Cut back to 1/2 ppd past year.  No prior quitting. Trying to cut back, wean self off.    Generalized anxiety disorder: Has continued on Xanax 1 mg twice daily.  See previous notes regarding psychiatry follow-up.  Has scheduled appointment in April, see phone note.  Unable to provide sufficient specimen for urine test last week, and to return in few days for testing but had automotive issues.  Will be testing today.    History Patient Active Problem List   Diagnosis Date Noted  . Extensor tenosynovitis of left wrist 09/20/2018  . PAD (peripheral artery disease) ([place]) 07/24/2016  . Peripheral arterial disease ([place]) 07/17/2016  . Lumbar stenosis with neurogenic claudication 12/03/2015  . CVA, old, hemiparesis ([place]) 03/24/2011  . ANXIETY DISORDER 10/04/2007  . TOBACCO ABUSE 09/30/2007  . GERD 09/30/2007  . DYSLIPIDEMIA 09/15/2007  . Hemiplegia, late effect of cerebrovascular disease  ([place]) 09/15/2007   Past Medical History:  Diagnosis Date  . Anxiety   . Hyperlipidemia   . Peripheral vascular disease ([place])   . Stroke [facility]) 2009   no residual   Past Surgical History:  Procedure Laterality Date  . ABDOMINAL AORTOGRAM W/LOWER EXTREMITY [name] 06/09/2016   Procedure: Abdominal Aortogram w/Lower Extremity;  Surgeon: Serafina Mitchell, MD;  Location: [facility];  Service: Cardiovascular;  Laterality: [name];  . BACK SURGERY     10 yrs ago or so-fusion in neck   . CERVICAL FUSION    . COLONOSCOPY     15-20 yrs ago-normal per pt.   . COLONOSCOPY W/ POLYPECTOMY    . CYSTOSCOPY     kidney stools removed   . FEMORAL-POPLITEAL BYPASS GRAFT Right 07/24/2016   Procedure: RIGHT FEMORAL TO ABOVE KNEE POPLITEAL ARTERY BYPASS GRAFT;  Surgeon: Serafina Mitchell, MD;  Location: [place];  Service: Vascular;  Laterality: Right;  . LUMBAR LAMINECTOMY/DECOMPRESSION MICRODISCECTOMY Bilateral 12/03/2015   Procedure: Microdiscectomy - bilateral - Lumbar four-lumbar five;  Surgeon: Earnie Larsson, MD;  Location: [place];  Service: Neurosurgery;  Laterality: Bilateral;  . PERIPHERAL VASCULAR INTERVENTION Right 06/09/2016   Procedure: Peripheral Vascular Intervention;  Surgeon: Serafina Mitchell, MD;  Location: [place] [facility];  Service: Cardiovascular;  Laterality: Right;  SFA  . REPAIR EXTENSOR TENDON Left 10/12/2018   Procedure: TENDON SHEATH RELEASE/TENOLYSIS;  Surgeon: Leandrew Koyanagi, MD;  Location: [facility]  [place];  Service: Orthopedics;  Laterality: Left;  . teeth removal     Allergies  Allergen Reactions  . No Known Allergies    Prior to Admission medications   Medication Sig Start Date End Date Taking? Authorizing Provider  ALPRAZolam (XANAX) 1 MG tablet TAKE 1/2 TO 1 (ONE-HALF TO ONE) TABLET BY MOUTH TWICE DAILY AS NEEDED FOR ANXIETY 02/10/19  Yes [name], MD  amitriptyline (ELAVIL) 50 MG tablet Take 1 tablet (50 mg total) by mouth at bedtime. 11/14/18  Yes [name], MD  amLODipine (NORVASC) 10 MG tablet Take 1 tablet (10 mg total) by mouth daily. 03/06/19  Yes [name], MD  clopidogrel (PLAVIX) 75 MG tablet Take 1 tablet (75 mg total) by mouth daily. 07/13/18  Yes [name], MD  fluticasone (FLONASE) 50 MCG/ACT nasal spray Place 2 sprays into both nostrils daily. 10/20/18  Yes [name], MD  ipratropium (ATROVENT) 0.06 % nasal spray Place 1-2 sprays into both nostrils 4 (four) times daily. As needed for nasal congestion 11/14/18  Yes [name], MD  lovastatin (MEVACOR) 20 MG tablet Take 1 tablet (20 mg total) by mouth at bedtime. 07/13/18  Yes [name], MD   Social History   Socioeconomic History  . Marital status: Married    Spouse name: Not on file  . Number of children: Not on file  . Years of education: Not on file  . Highest education level: Not on file  Occupational History  . Not on file  Tobacco Use  . Smoking status: Current Every Day Smoker    Packs/day: 0.50    Years: 47.00    Pack years: 23.50    Types: Cigarettes  . Smokeless tobacco: Never Used  . Tobacco comment: advised to hold for 24 hours prior  Substance and Sexual Activity  . Alcohol use: No    Alcohol/week: 0.0 standard drinks    Comment: no alcohol in 23 years (12/02/15  . Drug use: No  . Sexual activity: Not on file  Other Topics Concern  . Not on file  Social History Narrative   Wife is bedridden and he is primary caregiver---no family.   Social Determinants of Health   Financial Resource Strain:   . Difficulty of Paying Living Expenses: Not on file  Food Insecurity:   . Worried About Charity fundraiser in the Last Year: Not on file  . Ran Out of Food in the Last Year: Not on file  Transportation Needs:   . Lack of Transportation (Medical): Not on file  . Lack of Transportation (Non-Medical): Not on file  Physical Activity:   . Days of Exercise per Week: Not on file  . Minutes of Exercise per Session: Not on  file  Stress:   . Feeling of Stress : Not on file  Social Connections:   . Frequency of Communication with Friends and Family: Not on file  . Frequency of Social Gatherings with Friends and Family: Not on file  . Attends Religious Services: Not on file  . Active Member of Clubs or Organizations: Not on file  . Attends Archivist Meetings: Not on file  . Marital Status: Not on file  Intimate Partner Violence:   . Fear of Current or Ex-Partner: Not on file  . Emotionally Abused: Not on file  . Physically Abused: Not on file  . Sexually Abused: Not on file    Review of Systems  Constitutional: Negative for fatigue and unexpected weight change.  Eyes: Negative for visual disturbance.  Respiratory: Negative for cough, chest tightness and shortness of breath.   Cardiovascular: Negative for chest pain, palpitations and leg swelling.  Gastrointestinal: Negative for abdominal pain and blood in stool.  Neurological: Negative for dizziness, light-headedness and headaches.     Objective:   Vitals:   03/13/19 1535 03/13/19 1543  BP: (!) 181/79 (!) 174/78  Pulse: 84   Temp: 98.3 F (36.8 C)   TempSrc: Temporal   SpO2: 99%   Weight: 141 lb (64 kg)   Height: 5\' 9"  (1.753 m)      Physical Exam Vitals reviewed.  Constitutional:      Appearance: He is well-developed.  HENT:     Head: Normocephalic and atraumatic.  Eyes:     Pupils: Pupils are equal, round, and reactive to light.  Neck:     Vascular: No carotid bruit or JVD.  Cardiovascular:     Rate and Rhythm: Normal rate and regular rhythm.     Heart sounds: Normal heart sounds. No murmur.  Pulmonary:     Effort: Pulmonary effort is normal.     Breath sounds: Normal breath sounds. No rales.  Musculoskeletal:     Right lower leg: No edema.     Left lower leg: No edema.  Skin:    General: Skin is warm and dry.  Neurological:     Mental Status: He is alert and oriented to person, place, and time.         Assessment & Plan:  KAMAI NOWICKI is a 64 y.o. male . Essential hypertension - Plan: ramipril (ALTACE) 2.5 MG capsule PVD (peripheral vascular disease) ([place]) - Plan: ramipril (ALTACE) 2.5 MG capsule  -Decreased BP control, even with higher dose amlodipine.  Add ramipril 2.5 mg daily, which may also be helpful given history of PVD.  Recheck in 1 month.  Cigarette nicotine dependence without complication  -Cessation discussed, importance of cessation especially with PVD discussed.  Handout provided.  Generalized anxiety disorder - Plan: ToxASSURE Select 13 (MW), Urine, ALPRAZolam (XANAX) 1 MG tablet Medication monitoring encounter - Plan: ToxASSURE Select 13 (MW), Urine Insomnia, unspecified type - Plan: ALPRAZolam (XANAX) 1 MG tablet  -See telephone note, has contacted psychiatry, follow-up appointment planned in April.  Will monitor medications, refill Xanax at this time.  Urine drug screen obtained as above.  Meds ordered this encounter  Medications  . ramipril (ALTACE) 2.5 MG capsule    Sig: Take 1 capsule (2.5 mg total) by mouth daily.    Dispense:  90 capsule    Refill:  1  . ALPRAZolam (XANAX) 1 MG tablet    Sig: TAKE 1/2 TO 1 (ONE-HALF TO ONE) TABLET BY MOUTH TWICE DAILY AS NEEDED FOR ANXIETY    Dispense:  60 tablet    Refill:  0    Ok to fill 03/15/19   Patient Instructions    Add ramipril once per day, continue amlodipine 10mg  per day. recheck in 1 month. let me know if I can help with your quitting smoking.  Keep a record of your blood pressures outside of the office and bring them to the next office visit.  Return to the clinic or go to the nearest emergency room if any of your symptoms worsen or new symptoms occur.   Steps to Quit Smoking Smoking tobacco is the leading cause of preventable death. It can affect almost every organ in the body. Smoking puts  you and those around you at risk for developing many serious chronic diseases. Quitting smoking can  be difficult, but it is one of the best things that you can do for your health. It is never too late to quit. How do I get ready to quit? When you decide to quit smoking, create a plan to help you succeed. Before you quit:  Pick a date to quit. Set a date within the next 2 weeks to give you time to prepare.  Write down the reasons why you are quitting. Keep this list in places where you will see it often.  Tell your family, friends, and co-workers that you are quitting. Support from your loved ones can make quitting easier.  Talk with your health care provider about your options for quitting smoking.  Find out what treatment options are covered by your health insurance.  Identify people, places, things, and activities that make you want to smoke (triggers). Avoid them. What first steps can I take to quit smoking?  Throw away all cigarettes at home, at work, and in your car.  Throw away smoking accessories, such as Scientist, research (medical).  Clean your car. Make sure to empty the ashtray.  Clean your home, including curtains and carpets. What strategies can I use to quit smoking? Talk with your health care provider about combining strategies, such as taking medicines while you are also receiving in-person counseling. Using these two strategies together makes you more likely to succeed in quitting than if you used either strategy on its own.  If you are pregnant or breastfeeding, talk with your health care provider about finding counseling or other support strategies to quit smoking. Do not take medicine to help you quit smoking unless your health care provider tells you to do so. To quit smoking: Quit right away  Quit smoking completely, instead of gradually reducing how much you smoke over a period of time. Research shows that stopping smoking right away is more successful than gradually quitting.  Attend in-person counseling to help you build problem-solving skills. You are more likely  to succeed in quitting if you attend counseling sessions regularly. Even short sessions of 10 minutes can be effective. Take medicine You may take medicines to help you quit smoking. Some medicines require a prescription and some you can purchase over-the-counter. Medicines may have nicotine in them to replace the nicotine in cigarettes. Medicines may:  Help to stop cravings.  Help to relieve withdrawal symptoms. Your health care provider may recommend:  Nicotine patches, gum, or lozenges.  Nicotine inhalers or sprays.  Non-nicotine medicine that is taken by mouth. Find resources Find resources and support systems that can help you to quit smoking and remain smoke-free after you quit. These resources are most helpful when you use them often. They include:  Online chats with a Social worker.  Telephone quitlines.  Printed Furniture conservator/restorer.  Support groups or group counseling.  Text messaging programs.  Mobile phone apps or applications. Use apps that can help you stick to your quit plan by providing reminders, tips, and encouragement. There are many free apps for mobile devices as well as websites. Examples include Quit Guide from the [employer] and smokefree.gov What things can I do to make it easier to quit?   Reach out to your family and friends for support and encouragement. Call telephone quitlines (1-800-QUIT-NOW), reach out to support groups, or work with a counselor for support.  Ask people who smoke to avoid smoking around you.  Avoid  places that trigger you to smoke, such as bars, parties, or smoke-break areas at work.  Spend time with people who do not smoke.  Lessen the stress in your life. Stress can be a smoking trigger for some people. To lessen stress, try: ? Exercising regularly. ? Doing deep-breathing exercises. ? Doing yoga. ? Meditating. ? Performing a body scan. This involves closing your eyes, scanning your body from head to toe, and noticing which parts of  your body are particularly tense. Try to relax the muscles in those areas. How will I feel when I quit smoking? Day 1 to 3 weeks Within the first 24 hours of quitting smoking, you may start to feel withdrawal symptoms. These symptoms are usually most noticeable 2-3 days after quitting, but they usually do not last for more than 2-3 weeks. You may experience these symptoms:  Mood swings.  Restlessness, anxiety, or irritability.  Trouble concentrating.  Dizziness.  Strong cravings for sugary foods and nicotine.  Mild weight gain.  Constipation.  Nausea.  Coughing or a sore throat.  Changes in how the medicines that you take for unrelated issues work in your body.  Depression.  Trouble sleeping (insomnia). Week 3 and afterward After the first 2-3 weeks of quitting, you may start to notice more positive results, such as:  Improved sense of smell and taste.  Decreased coughing and sore throat.  Slower heart rate.  Lower blood pressure.  Clearer skin.  The ability to breathe more easily.  Fewer sick days. Quitting smoking can be very challenging. Do not get discouraged if you are not successful the first time. Some people need to make many attempts to quit before they achieve long-term success. Do your best to stick to your quit plan, and talk with your health care provider if you have any questions or concerns. Summary  Smoking tobacco is the leading cause of preventable death. Quitting smoking is one of the best things that you can do for your health.  When you decide to quit smoking, create a plan to help you succeed.  Quit smoking right away, not slowly over a period of time.  When you start quitting, seek help from your health care provider, family, or friends. This information is not intended to replace advice given to you by your health care provider. Make sure you discuss any questions you have with your health care provider. Document Revised: 10/07/2018  Document Reviewed: 04/02/2018 Elsevier Patient Education  [employer].    If you have lab work done today you will be contacted with your lab results within the next 2 weeks.  If you have not heard from [place] then please contact us. The fastest way to get your results is to register for My Chart.   IF you received an x-ray today, you will receive an invoice from [facility]. Please contact [facility] at [phone] with questions or concerns regarding your invoice.   IF you received labwork today, you will receive an invoice from [facility]. Please contact [facility] at [phone] with questions or concerns regarding your invoice.   Our billing staff will not be able to assist you with questions regarding bills from these companies.  You will be contacted with the lab results as soon as they are available. The fastest way to get your results is to activate your My Chart account. Instructions are located on the last page of this paperwork. If you have not heard from [place] regarding the results in 2 weeks, please contact  this office.         Signed, [name], MD [facility]

## 2019-03-14 ENCOUNTER — Encounter: Payer: Self-pay | Admitting: Family Medicine

## 2019-03-15 LAB — TOXASSURE SELECT 13 (MW), URINE

## 2019-03-16 ENCOUNTER — Ambulatory Visit: Payer: Medicare HMO | Admitting: Rheumatology

## 2019-03-31 NOTE — Progress Notes (Deleted)
Office Visit Note  Patient: Roy Moore             Date of Birth: 04/17/1955           MRN: [id number]             PCP: [name], MD Referring: Leandrew Koyanagi, MD Visit Date: 04/06/2019 Occupation: @GUAROCC @  Subjective:  No chief complaint on file.   History of Present Illness: Roy Moore is a 64 y.o. male ***   Activities of Daily Living:  Patient reports morning stiffness for *** {minute/hour:19697}.   Patient {ACTIONS;DENIES/REPORTS:21021675::"Denies"} nocturnal pain.  Difficulty dressing/grooming: {ACTIONS;DENIES/REPORTS:21021675::"Denies"} Difficulty climbing stairs: {ACTIONS;DENIES/REPORTS:21021675::"Denies"} Difficulty getting out of chair: {ACTIONS;DENIES/REPORTS:21021675::"Denies"} Difficulty using hands for taps, buttons, cutlery, and/or writing: {ACTIONS;DENIES/REPORTS:21021675::"Denies"}  No Rheumatology ROS completed.   PMFS History:  Patient Active Problem List   Diagnosis Date Noted  . Extensor tenosynovitis of left wrist 09/20/2018  . PAD (peripheral artery disease) ([place]) 07/24/2016  . Peripheral arterial disease ([place]) 07/17/2016  . Lumbar stenosis with neurogenic claudication 12/03/2015  . CVA, old, hemiparesis ([place]) 03/24/2011  . ANXIETY DISORDER 10/04/2007  . TOBACCO ABUSE 09/30/2007  . GERD 09/30/2007  . DYSLIPIDEMIA 09/15/2007  . Hemiplegia, late effect of cerebrovascular disease ([place]) 09/15/2007    Past Medical History:  Diagnosis Date  . Anxiety   . Hyperlipidemia   . Peripheral vascular disease ([place])   . Stroke [facility]) 2009   no residual    Family History  Problem Relation Age of Onset  . Colon cancer Neg Hx   . Rectal cancer Neg Hx   . Stomach cancer Neg Hx   . Esophageal cancer Neg Hx    Past Surgical History:  Procedure Laterality Date  . ABDOMINAL AORTOGRAM W/LOWER EXTREMITY [name] 06/09/2016   Procedure: Abdominal Aortogram w/Lower Extremity;  Surgeon: Serafina Mitchell, MD;  Location: [facility];  Service:  Cardiovascular;  Laterality: [name];  . BACK SURGERY     10 yrs ago or so-fusion in neck   . CERVICAL FUSION    . COLONOSCOPY     15-20 yrs ago-normal per pt.   . COLONOSCOPY W/ POLYPECTOMY    . CYSTOSCOPY     kidney stools removed   . FEMORAL-POPLITEAL BYPASS GRAFT Right 07/24/2016   Procedure: RIGHT FEMORAL TO ABOVE KNEE POPLITEAL ARTERY BYPASS GRAFT;  Surgeon: Serafina Mitchell, MD;  Location: [place];  Service: Vascular;  Laterality: Right;  . LUMBAR LAMINECTOMY/DECOMPRESSION MICRODISCECTOMY Bilateral 12/03/2015   Procedure: Microdiscectomy - bilateral - Lumbar four-lumbar five;  Surgeon: Earnie Larsson, MD;  Location: [place];  Service: Neurosurgery;  Laterality: Bilateral;  . PERIPHERAL VASCULAR INTERVENTION Right 06/09/2016   Procedure: Peripheral Vascular Intervention;  Surgeon: Serafina Mitchell, MD;  Location: [facility];  Service: Cardiovascular;  Laterality: Right;  SFA  . REPAIR EXTENSOR TENDON Left 10/12/2018   Procedure: TENDON SHEATH RELEASE/TENOLYSIS;  Surgeon: Leandrew Koyanagi, MD;  Location: [place];  Service: Orthopedics;  Laterality: Left;  . teeth removal     Social History   Social History Narrative   Wife is bedridden and he is primary caregiver---no family.   Immunization History  Administered Date(s) Administered  . Tdap 01/21/2014     Objective: Vital Signs: There were no vitals taken for this visit.   Physical Exam   Musculoskeletal Exam: ***  CDAI Exam: CDAI Score: -- Patient Global: --; Provider Global: -- Swollen: --; Tender: -- Joint Exam 04/06/2019   No joint  exam has been documented for this visit   There is currently no information documented on the homunculus. Go to the Rheumatology activity and complete the homunculus joint exam.  Investigation: No additional findings.  Imaging: No results found.  Recent Labs: Lab Results  Component Value Date   WBC 12.8 (H) 12/08/2016   HGB 12.6 (L) 12/08/2016   PLT 345 12/08/2016    NA 141 03/06/2019   K 4.3 03/06/2019   CL 105 03/06/2019   CO2 24 03/06/2019   GLUCOSE 110 (H) 03/06/2019   BUN 15 03/06/2019   CREATININE 1.17 03/06/2019   BILITOT 0.3 07/13/2018   ALKPHOS 82 07/13/2018   AST 16 07/13/2018   ALT 10 07/13/2018   PROT 6.7 07/13/2018   ALBUMIN 4.0 07/13/2018   CALCIUM 9.5 03/06/2019   GFRAA 77 03/06/2019    Speciality Comments: No specialty comments available.  Procedures:  No procedures performed Allergies: No known allergies   Assessment / Plan:     Visit Diagnoses: No diagnosis found.  Orders: No orders of the defined types were placed in this encounter.  No orders of the defined types were placed in this encounter.   Face-to-face time spent with patient was *** minutes. Greater than 50% of time was spent in counseling and coordination of care.  Follow-Up Instructions: No follow-ups on file.   Ofilia Neas, PA-C  Note - This record has been created using Dragon software.  Chart creation errors have been sought, but may not always  have been located. Such creation errors do not reflect on  the standard of medical care.

## 2019-04-06 ENCOUNTER — Ambulatory Visit: Payer: Medicare HMO | Admitting: Rheumatology

## 2019-04-12 ENCOUNTER — Other Ambulatory Visit: Payer: Self-pay | Admitting: Family Medicine

## 2019-04-12 DIAGNOSIS — G4700 Insomnia, unspecified: Secondary | ICD-10-CM

## 2019-04-12 DIAGNOSIS — F411 Generalized anxiety disorder: Secondary | ICD-10-CM

## 2019-04-12 NOTE — Telephone Encounter (Signed)
Appointment planned for March 22, controlled substance registry reviewed with last refill February 15.  Refill ordered until we can follow-up in office and discussed this medication and results from last visit.

## 2019-04-12 NOTE — Telephone Encounter (Signed)
Requested medication (s) are due for refill today: Yes  Requested medication (s) are on the active medication list: Yes  Last refill:  03/13/19  Future visit scheduled: Yes  Notes to clinic:  See request.    Requested Prescriptions  Pending Prescriptions Disp Refills   ALPRAZolam (XANAX) 1 MG tablet [Pharmacy Med Name: ALPRAZolam 1 MG Oral Tablet] 60 tablet 0    Sig: TAKE 1/2 TO 1 (ONE-HALF TO ONE) TABLET BY MOUTH TWICE DAILY AS NEEDED FOR ANXIETY      Not Delegated - Psychiatry:  Anxiolytics/Hypnotics Failed - 04/12/2019  2:36 PM      Failed - This refill cannot be delegated      Failed - Urine Drug Screen completed in last 360 days.      Passed - Valid encounter within last 6 months    Recent Outpatient Visits           1 month ago Essential hypertension   [facility], Ranell Patrick, MD   1 month ago Essential hypertension   [facility], Ranell Patrick, MD   2 months ago Medicare annual wellness visit, subsequent   [facility], Ranell Patrick, MD   4 months ago Essential hypertension   [facility] [place], MD   5 months ago Generalized anxiety disorder   [facility], Ranell Patrick, MD       Future Appointments             In 5 days [name], MD [facility] [place], [facility]

## 2019-04-17 ENCOUNTER — Ambulatory Visit: Payer: Medicare HMO | Admitting: Family Medicine

## 2019-05-01 ENCOUNTER — Ambulatory Visit: Payer: Medicare HMO | Admitting: Family Medicine

## 2019-05-12 ENCOUNTER — Other Ambulatory Visit: Payer: Self-pay | Admitting: Family Medicine

## 2019-05-12 DIAGNOSIS — G4700 Insomnia, unspecified: Secondary | ICD-10-CM

## 2019-05-12 DIAGNOSIS — F411 Generalized anxiety disorder: Secondary | ICD-10-CM

## 2019-05-12 NOTE — Telephone Encounter (Signed)
Patient is requesting a refill of the following medications: Requested Prescriptions   Pending Prescriptions Disp Refills   ALPRAZolam (XANAX) 1 MG tablet [Pharmacy Med Name: ALPRAZolam 1 MG Oral Tablet] 60 tablet 0    Sig: TAKE 1/2 (ONE-HALF) TO 1 TABLET BY MOUTH TWICE DAILY AS NEEDED FOR ANXIETY    Date of patient request: 05/12/2019 Last office visit: 03/13/2019 Date of last refill: 04/06/2019 Last refill amount: 60

## 2019-05-12 NOTE — Telephone Encounter (Signed)
Requested medication (s) are due for refill today: yes  Requested medication (s) are on the active medication list: yes  Last refill:  04/13/19  Future visit scheduled: yes  Notes to clinic:  not delegated    Requested Prescriptions  Pending Prescriptions Disp Refills   ALPRAZolam (XANAX) 1 MG tablet [Pharmacy Med Name: ALPRAZolam 1 MG Oral Tablet] 60 tablet 0    Sig: TAKE 1/2 (ONE-HALF) TO 1 TABLET BY MOUTH TWICE DAILY AS NEEDED FOR ANXIETY      Not Delegated - Psychiatry:  Anxiolytics/Hypnotics Failed - 05/12/2019 11:56 AM      Failed - This refill cannot be delegated      Failed - Urine Drug Screen completed in last 360 days.      Passed - Valid encounter within last 6 months    Recent Outpatient Visits           2 months ago Essential hypertension   [facility], Ranell Patrick, MD   2 months ago Essential hypertension   [facility], Ranell Patrick, MD   3 months ago Medicare annual wellness visit, subsequent   [facility], Ranell Patrick, MD   5 months ago Essential hypertension   [facility] at [place], MD   6 months ago Generalized anxiety disorder   [facility], Ranell Patrick, MD       Future Appointments             In 3 days [name], MD [facility] at [place], [facility]

## 2019-05-13 NOTE — Telephone Encounter (Signed)
Last fill date 3/18.  Appointment pending in few days.  Will refill temporarily, but will need to discuss urine drug screen from last visit.

## 2019-05-15 ENCOUNTER — Encounter: Payer: Self-pay | Admitting: Family Medicine

## 2019-05-15 ENCOUNTER — Telehealth (INDEPENDENT_AMBULATORY_CARE_PROVIDER_SITE_OTHER): Payer: Medicare HMO | Admitting: Family Medicine

## 2019-05-15 VITALS — Ht 69.0 in | Wt 152.0 lb

## 2019-05-15 DIAGNOSIS — I739 Peripheral vascular disease, unspecified: Secondary | ICD-10-CM | POA: Diagnosis not present

## 2019-05-15 DIAGNOSIS — R69 Illness, unspecified: Secondary | ICD-10-CM | POA: Diagnosis not present

## 2019-05-15 DIAGNOSIS — I10 Essential (primary) hypertension: Secondary | ICD-10-CM

## 2019-05-15 DIAGNOSIS — F411 Generalized anxiety disorder: Secondary | ICD-10-CM | POA: Diagnosis not present

## 2019-05-15 NOTE — Progress Notes (Signed)
Virtual Visit via audio Note  I connected with Roy Moore on 05/15/19 at 10:23 PM by audio (no internet and no video capability on his phone.)enabled telemedicine application and verified that I am speaking with the correct person using two identifiers.   I discussed the limitations, risks, security and privacy concerns of performing an evaluation and management service by telephone and the availability of in person appointments. I also discussed with the patient that there may be a patient responsible charge related to this service. The patient expressed understanding and agreed to proceed, consent obtained  Chief complaint:  Chief Complaint  Patient presents with  . Hypertension    med refills     History of Present Illness: Roy Moore is a 64 y.o. male  Follow-up of anxiety, hypertension, PAD.   Hypertension: Decreased control in February, amlodipine was increased to 10 mg daily up from previous 7.5 mg.  History of PAD with femoropopliteal bypass on the right in 2018, was continued to smoke at March visit but cutting back.  Denied alcohol use in 25 years, denied caffeine.  Denied illicit drug use.  Denied added salt to food. With persistent elevations, ramipril 2.5 mg added, also potentially helpful with history of PVD. Home readings - checked at  [facility]: unknown readings, "a little bit high but not really high high".  New medicine ramipril caused headaches so stopped it. HA resolved off ramipril.  Did not call office to advise of this reaction. Only taking amlodipine 10mg  at this time.   BP Readings from Last 3 Encounters:  03/13/19 (!) 174/78  03/06/19 (!) 150/76  02/09/19 130/80   Lab Results  Component Value Date   CREATININE 1.17 03/06/2019   Generalized anxiety disorder Longstanding symptoms.  Attempted SSRI in the past with low-dose Zoloft with no change in symptoms with reported use for 3 months.  He has declined to meet with counselor or attempt at  different SSRI or repeat trial of SSRI.  He was referred in September 2020 for psychiatry and counseling.  Has been on longstanding Xanax 1 mg twice daily and reports control of symptoms with that dosing.  Some situational stressors with wife having a stroke in early January. Primary caretaker for his  UDS was attempted on February 8th visit with insufficient quality of urine, was not able to return after that visit for repeat testing as he had automotive issues. Urine drug screen was obtained on February 15, positive for alprazolam, alphahydroxy alprazolam, and carboxy THC.  As above he has denied use of marijuana.  Per telephone message February 9, psychiatry appointment on April 29 scheduled.  Eino Farber. Controlled substance database (PDMP) reviewed.  Alprazolam 1 mg filled on February 15, March 18, April 18, each for #60. Still taking full pill twice per day.  Now admits to take a toke of marijuana every now and then.   Patient Active Problem List   Diagnosis Date Noted  . Extensor tenosynovitis of left wrist 09/20/2018  . PAD (peripheral artery disease) ([place]) 07/24/2016  . Peripheral arterial disease ([place]) 07/17/2016  . Lumbar stenosis with neurogenic claudication 12/03/2015  . CVA, old, hemiparesis ([place]) 03/24/2011  . ANXIETY DISORDER 10/04/2007  . TOBACCO ABUSE 09/30/2007  . GERD 09/30/2007  . DYSLIPIDEMIA 09/15/2007  . Hemiplegia, late effect of cerebrovascular disease ([place]) 09/15/2007   Past Medical History:  Diagnosis Date  . Anxiety   . Hyperlipidemia   . Peripheral vascular disease ([place])   . Stroke [facility]) 2009  no residual   Past Surgical History:  Procedure Laterality Date  . ABDOMINAL AORTOGRAM W/LOWER EXTREMITY [name] 06/09/2016   Procedure: Abdominal Aortogram w/Lower Extremity;  Surgeon: Serafina Mitchell, MD;  Location: [place] [facility];  Service: Cardiovascular;  Laterality: [name];  . BACK SURGERY     10 yrs ago or so-fusion in neck   . CERVICAL FUSION    .  COLONOSCOPY     15-20 yrs ago-normal per pt.   . COLONOSCOPY W/ POLYPECTOMY    . CYSTOSCOPY     kidney stools removed   . FEMORAL-POPLITEAL BYPASS GRAFT Right 07/24/2016   Procedure: RIGHT FEMORAL TO ABOVE KNEE POPLITEAL ARTERY BYPASS GRAFT;  Surgeon: Serafina Mitchell, MD;  Location: [place];  Service: Vascular;  Laterality: Right;  . LUMBAR LAMINECTOMY/DECOMPRESSION MICRODISCECTOMY Bilateral 12/03/2015   Procedure: Microdiscectomy - bilateral - Lumbar four-lumbar five;  Surgeon: Earnie Larsson, MD;  Location: [place];  Service: Neurosurgery;  Laterality: Bilateral;  . PERIPHERAL VASCULAR INTERVENTION Right 06/09/2016   Procedure: Peripheral Vascular Intervention;  Surgeon: Serafina Mitchell, MD;  Location: [place] [facility];  Service: Cardiovascular;  Laterality: Right;  SFA  . REPAIR EXTENSOR TENDON Left 10/12/2018   Procedure: TENDON SHEATH RELEASE/TENOLYSIS;  Surgeon: Leandrew Koyanagi, MD;  Location: [place];  Service: Orthopedics;  Laterality: Left;  . teeth removal     Allergies  Allergen Reactions  . No Known Allergies    Prior to Admission medications   Medication Sig Start Date End Date Taking? Authorizing Provider  ALPRAZolam [name]) 1 MG tablet TAKE 1/2 (ONE-HALF) TO 1 TABLET BY MOUTH TWICE DAILY AS NEEDED FOR ANXIETY 05/13/19  Yes [name], MD  amitriptyline (ELAVIL) 50 MG tablet Take 1 tablet (50 mg total) by mouth at bedtime. 11/14/18  Yes [name], MD  amLODipine (NORVASC) 10 MG tablet Take 1 tablet (10 mg total) by mouth daily. 03/06/19  Yes [name], MD  clopidogrel (PLAVIX) 75 MG tablet Take 1 tablet (75 mg total) by mouth daily. 07/13/18  Yes [name], MD  lovastatin (MEVACOR) 20 MG tablet Take 1 tablet (20 mg total) by mouth at bedtime. 07/13/18  Yes [name], MD  ramipril (ALTACE) 2.5 MG capsule Take 1 capsule (2.5 mg total) by mouth daily. 03/13/19  Yes [name], MD  fluticasone [facility]) 50 MCG/ACT nasal spray Place  2 sprays into both nostrils daily. Patient not taking: Reported on 05/15/2019 10/20/18   [name], MD  ipratropium (ATROVENT) 0.06 % nasal spray Place 1-2 sprays into both nostrils 4 (four) times daily. As needed for nasal congestion Patient not taking: Reported on 05/15/2019 11/14/18   [name], MD   Social History   Socioeconomic History  . Marital status: Married    Spouse name: Not on file  . Number of children: Not on file  . Years of education: Not on file  . Highest education level: Not on file  Occupational History  . Not on file  Tobacco Use  . Smoking status: Current Every Day Smoker    Packs/day: 0.50    Years: 47.00    Pack years: 23.50    Types: Cigarettes  . Smokeless tobacco: Never Used  . Tobacco comment: advised to hold for 24 hours prior  Substance and Sexual Activity  . Alcohol use: No    Alcohol/week: 0.0 standard drinks    Comment: no alcohol in 23 years (12/02/15  . Drug use: No  .  Sexual activity: Not on file  Other Topics Concern  . Not on file  Social History Narrative   Wife is bedridden and he is primary caregiver---no family.   Social Determinants of Health   Financial Resource Strain:   . Difficulty of Paying Living Expenses:   Food Insecurity:   . Worried About Charity fundraiser in the Last Year:   . Arboriculturist in the Last Year:   Transportation Needs:   . Film/video editor (Medical):   Marland Kitchen Lack of Transportation (Non-Medical):   Physical Activity:   . Days of Exercise per Week:   . Minutes of Exercise per Session:   Stress:   . Feeling of Stress :   Social Connections:   . Frequency of Communication with Friends and Family:   . Frequency of Social Gatherings with Friends and Family:   . Attends Religious Services:   . Active Member of Clubs or Organizations:   . Attends Archivist Meetings:   Marland Kitchen Marital Status:   Intimate Partner Violence:   . Fear of Current or Ex-Partner:   . Emotionally  Abused:   Marland Kitchen Physically Abused:   . Sexually Abused:     Observations/Objective: Vitals:   05/15/19 1545  Weight: 152 lb (68.9 kg)  Height: 5\' 9"  (1.753 m)  Calm demeanor on phone, no distress.  Appropriate responses.  Understanding of plan expressed with all questions answered.   Assessment and Plan: Essential hypertension PVD (peripheral vascular disease) (HCC)  -Previously decreased control.  We will check home readings or readings pharmacy in the next week with update by phone.  Consider different ACE inhibitor versus other agent if still elevated.  Generalized anxiety disorder  -Has follow-up appointment with psychiatry in 10 days.  Recent refill of alprazolam.  Discussed previous urine drug screen, and recommendations against use of illicit drugs including marijuana at this time.  Understanding expressed..  Follow Up Instructions:   2 months I discussed the assessment and treatment plan with the patient. The patient was provided an opportunity to ask questions and all were answered. The patient agreed with the plan and demonstrated an understanding of the instructions.   The patient was advised to call back or seek an in-person evaluation if the symptoms worsen or if the condition fails to improve as anticipated.  I provided 14 minutes of non-face-to-face time during this encounter.   [name], MD

## 2019-05-15 NOTE — Patient Instructions (Addendum)
  Check blood pressures and call me with those numbers in next one week.  If still elevated, may need to try another medicine. Continue amlodipine for now.    If you have lab work done today you will be contacted with your lab results within the next 2 weeks.  If you have not heard from [place] then please contact us. The fastest way to get your results is to register for My Chart.   IF you received an x-ray today, you will receive an invoice from [facility]. Please contact [facility] at [phone] with questions or concerns regarding your invoice.   IF you received labwork today, you will receive an invoice from [facility]. Please contact [facility] at [phone] with questions or concerns regarding your invoice.   Our billing staff will not be able to assist you with questions regarding bills from these companies.  You will be contacted with the lab results as soon as they are available. The fastest way to get your results is to activate your My Chart account. Instructions are located on the last page of this paperwork. If you have not heard from [place] regarding the results in 2 weeks, please contact this office.

## 2019-05-25 DIAGNOSIS — Z79891 Long term (current) use of opiate analgesic: Secondary | ICD-10-CM | POA: Diagnosis not present

## 2019-05-25 DIAGNOSIS — F419 Anxiety disorder, unspecified: Secondary | ICD-10-CM | POA: Diagnosis not present

## 2019-05-25 DIAGNOSIS — R69 Illness, unspecified: Secondary | ICD-10-CM | POA: Diagnosis not present

## 2020-01-13 ENCOUNTER — Other Ambulatory Visit: Payer: Self-pay | Admitting: Family Medicine

## 2020-01-13 DIAGNOSIS — I10 Essential (primary) hypertension: Secondary | ICD-10-CM

## 2020-01-13 NOTE — Telephone Encounter (Signed)
Requested medication (s) are due for refill today: yes  Requested medication (s) are on the active medication list: yes  Last refill:  03/06/19   Future visit scheduled: no  Notes to clinic:  needs appt   Requested Prescriptions  Pending Prescriptions Disp Refills   amLODipine (NORVASC) 10 MG tablet [Pharmacy Med Name: amLODIPine Besylate 10 MG Oral Tablet] 90 tablet 0    Sig: Take 1 tablet by mouth once daily      Cardiovascular:  Calcium Channel Blockers Failed - 01/13/2020  3:50 PM      Failed - Last BP in normal range    BP Readings from Last 1 Encounters:  03/13/19 (!) 174/78          Failed - Valid encounter within last 6 months    Recent Outpatient Visits           8 months ago Essential hypertension   [facility] at [place], MD   10 months ago Essential hypertension   [facility] at [facility], Ranell Patrick, MD   10 months ago Essential hypertension   [facility] at [facility], Ranell Patrick, MD   11 months ago Medicare annual wellness visit, subsequent   [facility] at [facility], Ranell Patrick, MD   1 year ago Essential hypertension   [facility] at [facility], Ranell Patrick, MD

## 2020-04-16 DIAGNOSIS — I10 Essential (primary) hypertension: Secondary | ICD-10-CM | POA: Diagnosis not present

## 2020-04-16 DIAGNOSIS — R079 Chest pain, unspecified: Secondary | ICD-10-CM | POA: Diagnosis not present

## 2020-04-16 DIAGNOSIS — R0789 Other chest pain: Secondary | ICD-10-CM | POA: Diagnosis not present

## 2020-04-24 ENCOUNTER — Encounter (HOSPITAL_COMMUNITY): Payer: Self-pay | Admitting: Emergency Medicine

## 2020-04-24 ENCOUNTER — Ambulatory Visit (HOSPITAL_COMMUNITY)
Admission: EM | Admit: 2020-04-24 | Discharge: 2020-04-24 | Disposition: A | Discharge status: Home / Self Care | Payer: Medicare HMO

## 2020-04-24 ENCOUNTER — Other Ambulatory Visit: Payer: Self-pay

## 2020-04-24 DIAGNOSIS — H1032 Unspecified acute conjunctivitis, left eye: Secondary | ICD-10-CM

## 2020-04-24 DIAGNOSIS — S0502XA Injury of conjunctiva and corneal abrasion without foreign body, left eye, initial encounter: Secondary | ICD-10-CM | POA: Diagnosis not present

## 2020-04-24 MED ORDER — BACITRACIN-POLYMYXIN B 500-10000 UNIT/GM OP OINT: 1.0000 "application " | TOPICAL_OINTMENT | Freq: Two times a day (BID) | OPHTHALMIC | 0 refills | Status: DC

## 2020-04-24 MED ORDER — FLUORESCEIN SODIUM 1 MG OP STRP
ORAL_STRIP | OPHTHALMIC | Status: AC
  Filled 2020-04-24: qty 1

## 2020-04-24 NOTE — ED Triage Notes (Signed)
Pt presents today with c/o of left eye irritation x 3 days. He noticed it after cutting eyebrows and believes he may have a hair in it.

## 2020-04-25 ENCOUNTER — Telehealth (HOSPITAL_COMMUNITY): Payer: Self-pay | Admitting: Emergency Medicine

## 2020-04-25 MED ORDER — POLYMYXIN B-TRIMETHOPRIM 10000-0.1 UNIT/ML-% OP SOLN: 1.0000 [drp] | Freq: Four times a day (QID) | OPHTHALMIC | 0 refills | Status: AC

## 2020-04-25 NOTE — Telephone Encounter (Signed)
Patient states he cannot get the ointment to work correctly, and he needs relief.  Drops sent in, per Claiborne Billings, APP.  Attempted to reach patient to update him, unable to LVM

## 2020-04-26 ENCOUNTER — Encounter (HOSPITAL_COMMUNITY): Payer: Self-pay | Admitting: Medical Oncology

## 2020-04-26 NOTE — ED Provider Notes (Signed)
[place]    CSN: [id number] Arrival date & time: 04/24/20  1147      History   Chief Complaint Chief Complaint  Patient presents with  . Foreign Body in Eye    Left     HPI Roy Moore is a 65 y.o. male.   HPI   Left Eye Pain: Pt reports that they have had left eye pain for the past 3 days. He states that symptoms started after he was trimming his eyebrows and suspects that an eye lash got in his eye. No trauma, history of glacoma or contact use. He tried a saline flush of the eye with some relief. Some mild foreign body sensation. No pain of the actual eye, headache, fevers, cold symptoms. Mild photosensitivity and discharge of the left eye only. He denies changes in vision of the left eye.   Past Medical History:  Diagnosis Date  . Anxiety   . Hyperlipidemia   . Peripheral vascular disease ([place])   . Stroke [facility]) 2009   no residual    Patient Active Problem List   Diagnosis Date Noted  . Extensor tenosynovitis of left wrist 09/20/2018  . PAD (peripheral artery disease) ([place]) 07/24/2016  . Peripheral arterial disease ([place]) 07/17/2016  . Lumbar stenosis with neurogenic claudication 12/03/2015  . CVA, old, hemiparesis ([place]) 03/24/2011  . ANXIETY DISORDER 10/04/2007  . TOBACCO ABUSE 09/30/2007  . GERD 09/30/2007  . DYSLIPIDEMIA 09/15/2007  . Hemiplegia, late effect of cerebrovascular disease ([place]) 09/15/2007    Past Surgical History:  Procedure Laterality Date  . ABDOMINAL AORTOGRAM W/LOWER EXTREMITY [name] 06/09/2016   Procedure: Abdominal Aortogram w/Lower Extremity;  Surgeon: Serafina Mitchell, MD;  Location: [place] [facility];  Service: Cardiovascular;  Laterality: [name];  . BACK SURGERY     10 yrs ago or so-fusion in neck   . CERVICAL FUSION    . COLONOSCOPY     15-20 yrs ago-normal per pt.   . COLONOSCOPY W/ POLYPECTOMY    . CYSTOSCOPY     kidney stools removed   . FEMORAL-POPLITEAL BYPASS GRAFT Right 07/24/2016   Procedure: RIGHT FEMORAL TO  ABOVE KNEE POPLITEAL ARTERY BYPASS GRAFT;  Surgeon: Serafina Mitchell, MD;  Location: [place];  Service: Vascular;  Laterality: Right;  . LUMBAR LAMINECTOMY/DECOMPRESSION MICRODISCECTOMY Bilateral 12/03/2015   Procedure: Microdiscectomy - bilateral - Lumbar four-lumbar five;  Surgeon: Earnie Larsson, MD;  Location: [place];  Service: Neurosurgery;  Laterality: Bilateral;  . PERIPHERAL VASCULAR INTERVENTION Right 06/09/2016   Procedure: Peripheral Vascular Intervention;  Surgeon: Serafina Mitchell, MD;  Location: [place] [facility];  Service: Cardiovascular;  Laterality: Right;  SFA  . REPAIR EXTENSOR TENDON Left 10/12/2018   Procedure: TENDON SHEATH RELEASE/TENOLYSIS;  Surgeon: Leandrew Koyanagi, MD;  Location: [place];  Service: Orthopedics;  Laterality: Left;  . teeth removal         Home Medications    Prior to Admission medications   Medication Sig Start Date End Date Taking? Authorizing Provider  amLODipine (NORVASC) 10 MG tablet Take 1 tablet by mouth once daily 01/15/20  Yes [name], MD  aspirin EC 81 MG tablet Take 81 mg by mouth daily. Swallow whole.   Yes [provider]  ALPRAZolam [name]) 1 MG tablet TAKE 1/2 (ONE-HALF) TO 1 TABLET BY MOUTH TWICE DAILY AS NEEDED FOR ANXIETY 05/13/19   [name], MD  amitriptyline (ELAVIL) 50 MG tablet Take 1 tablet (50 mg total) by mouth  at bedtime. 11/14/18   [name], MD  clopidogrel (PLAVIX) 75 MG tablet Take 1 tablet (75 mg total) by mouth daily. 07/13/18   [name], MD  fluticasone (FLONASE) 50 MCG/ACT nasal spray Place 2 sprays into both nostrils daily. Patient not taking: Reported on 05/15/2019 10/20/18   [name], MD  ipratropium (ATROVENT) 0.06 % nasal spray Place 1-2 sprays into both nostrils 4 (four) times daily. As needed for nasal congestion Patient not taking: Reported on 05/15/2019 11/14/18   [name], MD  lovastatin (MEVACOR) 20 MG tablet Take 1 tablet (20 mg total) by  mouth at bedtime. 07/13/18   [name], MD  ramipril (ALTACE) 2.5 MG capsule Take 1 capsule (2.5 mg total) by mouth daily. 03/13/19   [name], MD  trimethoprim-polymyxin b (POLYTRIM) ophthalmic solution Place 1 drop into both eyes every 6 (six) hours for 7 days. 04/25/20 05/02/20  Sharion Balloon, NP    Family History Family History  Problem Relation Age of Onset  . Colon cancer Neg Hx   . Rectal cancer Neg Hx   . Stomach cancer Neg Hx   . Esophageal cancer Neg Hx     Social History Social History   Tobacco Use  . Smoking status: Current Every Day Smoker    Packs/day: 0.50    Years: 47.00    Pack years: 23.50    Types: Cigarettes  . Smokeless tobacco: Never Used  . Tobacco comment: advised to hold for 24 hours prior  Vaping Use  . Vaping Use: Never used  Substance Use Topics  . Alcohol use: No    Alcohol/week: 0.0 standard drinks    Comment: no alcohol in 23 years (12/02/15  . Drug use: No     Allergies   No known allergies   Review of Systems Review of Systems  As stated above in HPI Physical Exam Triage Vital Signs ED Triage Vitals  Enc Vitals Group     BP 04/24/20 1216 (!) 159/65     Pulse Rate 04/24/20 1216 78     Resp 04/24/20 1216 18     Temp 04/24/20 1216 98 F (36.7 C)     Temp Source 04/24/20 1216 Oral     SpO2 04/24/20 1216 96 %     Weight --      Height --      Head Circumference --      Peak Flow --      Pain Score 04/24/20 1212 3     Pain Loc --      Pain Edu? --      Excl. in [place]? --    No data found.  Updated Vital Signs BP (!) 159/65 (BP Location: Left Arm)   Pulse 78   Temp 98 F (36.7 C) (Oral)   Resp 18   SpO2 96%   Visual Acuity Right Eye Distance: 20/100 Left Eye Distance: 20/200 Bilateral Distance: 20/100  Right Eye Near:   Left Eye Near:    Bilateral Near:     Physical Exam Vitals and nursing note reviewed.  Constitutional:      General: He is not in acute distress.    Appearance: Normal appearance.  He is not ill-appearing, toxic-appearing or diaphoretic.  HENT:     Head: Normocephalic and atraumatic.     Right Ear: Tympanic membrane, ear canal and external ear normal.     Left Ear: Tympanic membrane, ear canal and external ear normal.  Nose: Nose normal.  Eyes:     General: Lids are normal. Lids are everted, no foreign bodies appreciated. Vision grossly intact. Gaze aligned appropriately. No allergic shiner or visual field deficit.       Right eye: No foreign body, discharge or hordeolum.        Left eye: Discharge present.No foreign body or hordeolum.     Extraocular Movements: Extraocular movements intact.     Conjunctiva/sclera:     Left eye: Left conjunctiva is injected. Exudate present.     Pupils: Pupils are equal, round, and reactive to light.   Neurological:     Mental Status: He is alert.      UC Treatments / Results  Labs (all labs ordered are listed, but only abnormal results are displayed) Labs Reviewed - No data to display  EKG   Radiology No results found.  Procedures Procedures (including critical care time)  Medications Ordered in UC Medications - No data to display  Initial Impression / Assessment and Plan / UC Course  I have reviewed the triage vital signs and the nursing notes.  Pertinent labs & imaging results that were available during my care of the patient were reviewed by me and considered in my medical decision making (see chart for details).     New. Discussed with patient. Treating for corneal abrasion as well as what appears to be a secondary bacterial infection. Visual acuity was different between eyes however pt denies any changes in vision, eyeball pain and he had foreign body sensation which likely played a role. I did recommend follow up with an eye specialist along with a discussion of red flag signs and symptoms.  Final Clinical Impressions(s) / UC Diagnoses   Final diagnoses:  Scratched cornea, left, initial encounter   Acute conjunctivitis of left eye, unspecified acute conjunctivitis type   Discharge Instructions   None    ED Prescriptions    Medication Sig Dispense Auth. Provider   bacitracin-polymyxin b (POLYSPORIN) ophthalmic ointment Place 1 application into the left eye every 12 (twelve) hours. apply to eye every 12 hours while awake 3.5 g Shykeem Resurreccion M, [place]     PDMP not reviewed this encounter.   Hughie Closs, [place] 04/26/20 [phone]

## 2020-05-03 ENCOUNTER — Ambulatory Visit (HOSPITAL_COMMUNITY)
Admission: EM | Admit: 2020-05-03 | Discharge: 2020-05-03 | Disposition: A | Discharge status: Home / Self Care | Payer: Medicare HMO | Attending: Medical Oncology | Admitting: Medical Oncology

## 2020-05-03 ENCOUNTER — Encounter (HOSPITAL_COMMUNITY): Payer: Self-pay

## 2020-05-03 ENCOUNTER — Other Ambulatory Visit: Payer: Self-pay

## 2020-05-03 DIAGNOSIS — H5712 Ocular pain, left eye: Secondary | ICD-10-CM

## 2020-05-03 DIAGNOSIS — H16042 Marginal corneal ulcer, left eye: Secondary | ICD-10-CM | POA: Diagnosis not present

## 2020-05-03 NOTE — Discharge Instructions (Signed)
Please go to see Dr. Alanda Slim at [facility] at  [address] [place], [place]

## 2020-05-03 NOTE — ED Triage Notes (Signed)
Pt in with c/o left eye pain that has been going on for over 1 week.   Pt has been using prescribed eye drops with no relief  States he still feel like there is sand in his eye

## 2020-05-03 NOTE — ED Provider Notes (Addendum)
[place]    CSN: [id number] Arrival date & time: 05/03/20  1109      History   Chief Complaint Chief Complaint  Patient presents with  . Eye Pain    HPI DVON Roy Moore is a 65 y.o. male.   HPI   Eye Pain: Pt is seen for follow up of his left eye pain. See previous note. He was started on Poly[place] drops.  He presents again today stating that the symptoms have not improved at all but also have not worsened.  He is not reporting as much eye pain but still is having a foreign body sensation.  He still reports waking up with his eye matted shut.  Again he denies any visual changes.  No fevers, headaches.    Past Medical History:  Diagnosis Date  . Anxiety   . Hyperlipidemia   . Peripheral vascular disease ([place])   . Stroke [facility]) 2009   no residual    Patient Active Problem List   Diagnosis Date Noted  . Extensor tenosynovitis of left wrist 09/20/2018  . PAD (peripheral artery disease) ([place]) 07/24/2016  . Peripheral arterial disease ([place]) 07/17/2016  . Lumbar stenosis with neurogenic claudication 12/03/2015  . CVA, old, hemiparesis ([place]) 03/24/2011  . ANXIETY DISORDER 10/04/2007  . TOBACCO ABUSE 09/30/2007  . GERD 09/30/2007  . DYSLIPIDEMIA 09/15/2007  . 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 ABDOMINAL AORTOGRAM W/LOWER EXTREMITY [name] 06/09/2016   Procedure: Abdominal Aortogram w/Lower Extremity;  Surgeon: Serafina Mitchell, MD;  Location: [facility];  Service: Cardiovascular;  Laterality: [name];  . BACK SURGERY     10 yrs ago or so-fusion in neck   . CERVICAL FUSION    . COLONOSCOPY     15-20 yrs ago-normal per pt.   . COLONOSCOPY W/ POLYPECTOMY    . CYSTOSCOPY     kidney stools removed   . FEMORAL-POPLITEAL BYPASS GRAFT Right 07/24/2016   Procedure: RIGHT FEMORAL TO ABOVE KNEE POPLITEAL ARTERY BYPASS GRAFT;  Surgeon: Serafina Mitchell, MD;  Location: [facility];   Service: Vascular;  Laterality: Right;  . LUMBAR LAMINECTOMY/DECOMPRESSION MICRODISCECTOMY Bilateral 12/03/2015   Procedure: Microdiscectomy - bilateral - Lumbar four-lumbar five;  Surgeon: Earnie Larsson, MD;  Location: [facility];  Service: Neurosurgery;  Laterality: Bilateral;  . PERIPHERAL VASCULAR INTERVENTION Right 06/09/2016   Procedure: Peripheral Vascular Intervention;  Surgeon: Serafina Mitchell, MD;  Location: [facility];  Service: Cardiovascular;  Laterality: Right;  SFA  . REPAIR EXTENSOR TENDON Left 10/12/2018   Procedure: TENDON SHEATH RELEASE/TENOLYSIS;  Surgeon: Leandrew Koyanagi, MD;  Location: [facility];  Service: Orthopedics;  Laterality: Left;  . teeth removal         Home Medications    Prior to Admission medications   Medication Sig Start Date End Date Taking? Authorizing Provider  amLODipine (NORVASC) 10 MG tablet Take 1 tablet by mouth once daily 01/15/20   [name], MD  aspirin EC 81 MG tablet Take 81 mg by mouth daily. Swallow whole.    [provider]  clopidogrel (PLAVIX) 75 MG tablet Take 1 tablet (75 mg total) by mouth daily. 07/13/18   [name], MD  fluticasone (FLONASE) 50 MCG/ACT nasal spray Place 2 sprays into both nostrils daily. Patient not taking: Reported on 05/15/2019 10/20/18   [name], MD  ipratropium (ATROVENT) 0.06 % nasal spray Place  1-2 sprays into both nostrils 4 (four) times daily. As needed for nasal congestion Patient not taking: Reported on 05/15/2019 11/14/18   [name], MD  lovastatin (MEVACOR) 20 MG tablet Take 1 tablet (20 mg total) by mouth at bedtime. 07/13/18   [name], MD  amitriptyline (ELAVIL) 50 MG tablet Take 1 tablet (50 mg total) by mouth at bedtime. 11/14/18 04/26/20  [name], MD  ramipril (ALTACE) 2.5 MG capsule Take 1 capsule (2.5 mg total) by mouth daily. 03/13/19 04/26/20  [name], MD    Family History Family History  Problem Relation Age of  Onset  . Colon cancer Neg Hx   . Rectal cancer Neg Hx   . Stomach cancer Neg Hx   . Esophageal cancer Neg Hx     Social History Social History   Tobacco Use  . Smoking status: Current Every Day Smoker    Packs/day: 0.50    Years: 47.00    Pack years: 23.50    Types: Cigarettes  . Smokeless tobacco: Never Used  . Tobacco comment: advised to hold for 24 hours prior  Vaping Use  . Vaping Use: Never used  Substance Use Topics  . Alcohol use: No    Alcohol/week: 0.0 standard drinks    Comment: no alcohol in 23 years (12/02/15  . Drug use: No     Allergies   No known allergies   Review of Systems Review of Systems  As stated above in HPI Physical Exam Triage Vital Signs ED Triage Vitals  Enc Vitals Group     BP 05/03/20 1158 (!) 184/77     Pulse Rate 05/03/20 1158 87     Resp 05/03/20 1158 19     Temp 05/03/20 1158 98.5 F (36.9 C)     Temp src --      SpO2 05/03/20 1158 96 %     Weight --      Height --      Head Circumference --      Peak Flow --      Pain Score 05/03/20 1157 7     Pain Loc --      Pain Edu? --      Excl. in [place]? --    No data found.  Updated Vital Signs BP (!) 164/81 (BP Location: Right Arm)   Pulse 77   Temp 98.5 F (36.9 C)   Resp 19   SpO2 96%    Physical Exam Vitals and nursing note reviewed.  Constitutional:      General: He is not in acute distress.    Appearance: Normal appearance. He is not ill-appearing, toxic-appearing or diaphoretic.  HENT:     Head: Normocephalic and atraumatic.     Nose: Nose normal.     Mouth/Throat:     Mouth: Mucous membranes are moist.  Eyes:     General: Lids are normal. Lids are everted, no foreign bodies appreciated. Gaze aligned appropriately.        Right eye: No foreign body or discharge.        Left eye: No foreign body or discharge.     Extraocular Movements: Extraocular movements intact.     Right eye: Normal extraocular motion.     Left eye: Normal extraocular motion and no  nystagmus.     Conjunctiva/sclera:     Left eye: Left conjunctiva is injected.     Pupils: Pupils are equal, round, and reactive to light.  Comments: Foreign body sensation of left eye  Cardiovascular:     Rate and Rhythm: Normal rate and regular rhythm.     Heart sounds: Normal heart sounds.  Pulmonary:     Effort: Pulmonary effort is normal.     Breath sounds: Normal breath sounds.  Musculoskeletal:     Cervical back: Normal range of motion.  Neurological:     Mental Status: He is alert.      UC Treatments / Results  Labs (all labs ordered are listed, but only abnormal results are displayed) Labs Reviewed - No data to display  EKG   Radiology No results found.  Procedures Procedures (including critical care time)  Medications Ordered in UC Medications - No data to display  Initial Impression / Assessment and Plan / UC Course  I have reviewed the triage vital signs and the nursing notes.  Pertinent labs & imaging results that were available during my care of the patient were reviewed by me and considered in my medical decision making (see chart for details).     Unresolved.  According to patient his symptoms have not improved at all.  On examination the area of corneal abrasion appears to be significantly improved and he does not have any visual discharge however he states that he still is waking up with his eyes matted shut from a greenish discharge.  Overall his eye looks slightly less red but still quite injected.  Patient expresses significant transportation issues.  He had to call his brother-in-law from [place] to bring him today.  What I have recommended is that he be seen by an eye specialist to confirm my diagnosis.  I reached out to the on-call physician at [facility] and they are agreeable to see him in office right away.  Patient is agreeable and I discussed this along with giving him their address.  I also discussed with patient that I want  him to be seen by primary care provider to help work on his blood pressure.  For now decreased salt intake and he noted previously white coat symptoms.  Final Clinical Impressions(s) / UC Diagnoses   Final diagnoses:  Left eye pain     Discharge Instructions     Please go to see Dr. Alanda Slim at [facility] at  [address] [place], [place]     ED Prescriptions    None     PDMP not reviewed this encounter.   Hughie Closs, PA-C 05/03/20 [place], PA-C 05/03/20 1301

## 2020-05-15 ENCOUNTER — Other Ambulatory Visit: Payer: Self-pay | Admitting: Family Medicine

## 2020-05-15 DIAGNOSIS — I10 Essential (primary) hypertension: Secondary | ICD-10-CM

## 2020-05-15 NOTE — Telephone Encounter (Signed)
Requested medications are due for refill today.  yes  Requested medications are on the active medications list.  yes  Last refill. 01/15/2020  Future visit scheduled.   No  Notes to clinic.

## 2020-05-20 NOTE — Telephone Encounter (Signed)
Error, please disregard.

## 2020-06-26 ENCOUNTER — Ambulatory Visit (HOSPITAL_COMMUNITY): Payer: Medicare HMO

## 2021-01-09 ENCOUNTER — Encounter: Payer: Medicare HMO | Admitting: Family Medicine

## 2021-01-09 DIAGNOSIS — Z131 Encounter for screening for diabetes mellitus: Secondary | ICD-10-CM

## 2021-01-09 DIAGNOSIS — I10 Essential (primary) hypertension: Secondary | ICD-10-CM

## 2021-01-09 DIAGNOSIS — Z130 Encounter for screening for diseases of the blood and blood-forming organs and certain disorders involving the immune mechanism: Secondary | ICD-10-CM

## 2021-01-09 DIAGNOSIS — Z13220 Encounter for screening for lipoid disorders: Secondary | ICD-10-CM

## 2021-05-22 ENCOUNTER — Encounter (HOSPITAL_COMMUNITY): Payer: Self-pay | Admitting: Emergency Medicine

## 2021-05-22 ENCOUNTER — Ambulatory Visit (HOSPITAL_COMMUNITY)
Admission: EM | Admit: 2021-05-22 | Discharge: 2021-05-22 | Disposition: A | Discharge status: Home / Self Care | Payer: Medicare HMO | Attending: Nurse Practitioner | Admitting: Nurse Practitioner

## 2021-05-22 DIAGNOSIS — I10 Essential (primary) hypertension: Secondary | ICD-10-CM | POA: Diagnosis not present

## 2021-05-22 DIAGNOSIS — Z8673 Personal history of transient ischemic attack (TIA), and cerebral infarction without residual deficits: Secondary | ICD-10-CM

## 2021-05-22 DIAGNOSIS — Z8679 Personal history of other diseases of the circulatory system: Secondary | ICD-10-CM | POA: Diagnosis not present

## 2021-05-22 DIAGNOSIS — I739 Peripheral vascular disease, unspecified: Secondary | ICD-10-CM

## 2021-05-22 DIAGNOSIS — Z760 Encounter for issue of repeat prescription: Secondary | ICD-10-CM

## 2021-05-22 MED ORDER — CLOPIDOGREL BISULFATE 75 MG PO TABS: 75.0000 mg | ORAL_TABLET | Freq: Every day | ORAL | 0 refills | Status: DC

## 2021-05-22 MED ORDER — AMLODIPINE BESYLATE 10 MG PO TABS: 10.0000 mg | ORAL_TABLET | Freq: Every day | ORAL | 0 refills | Status: DC

## 2021-05-22 NOTE — Discharge Instructions (Addendum)
-   Please resume amlodipine and Plavix as previously prescribed and follow up with your primary care provider as planned ?

## 2021-05-22 NOTE — ED Provider Notes (Signed)
?[facility] ? ? ? ?CSN: [id number] ?Arrival date & time: 05/22/21  1248 ? ? ?  ? ?History   ?Chief Complaint ?Chief Complaint  ?Patient presents with  ? Medication Refill  ? ? ?HPI ?Roy Moore is a 66 y.o. male.  ? ?Patient presents for medication refill.  Reports he has been out of amlodipine and Plavix for about 30 days.  He takes amlodipine for blood pressure and Plavix for stroke prevention as he has had a stroke in the past.  He tells me today that he does check his blood pressure at home and has been elevated.  He also endorses some headache with elevated blood pressure.  He denies any vision changes, chest pain, shortness of breath, lightheadedness or dizziness, swelling in his lower extremities.  He denies any easy bruising or bleeding.  Denies any strokelike symptoms. ? ?Denies any issues while taking these medications.  He is in the process of transferring to a new primary care office and has an appointment coming up within the next month.   ? ? ?Past Medical History:  ?Diagnosis Date  ? Anxiety   ? Hyperlipidemia   ? Peripheral vascular disease ([place])   ? Stroke [facility]) 2009  ? no residual  ? ? ?Patient Active Problem List  ? Diagnosis Date Noted  ? Extensor tenosynovitis of left wrist 09/20/2018  ? PAD (peripheral artery disease) ([place]) 07/24/2016  ? Peripheral arterial disease ([place]) 07/17/2016  ? Lumbar stenosis with neurogenic claudication 12/03/2015  ? CVA, old, hemiparesis ([place]) 03/24/2011  ? ANXIETY DISORDER 10/04/2007  ? TOBACCO ABUSE 09/30/2007  ? GERD 09/30/2007  ? DYSLIPIDEMIA 09/15/2007  ? 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 ? ?Past Surgical History:  ?Procedure Laterality Date  ? ABDOMINAL AORTOGRAM W/LOWER EXTREMITY [name] 06/09/2016  ? Procedure: Abdominal Aortogram w/Lower Extremity;  Surgeon: Serafina Mitchell, MD;  Location: [facility];  Service: Cardiovascular;  Laterality: [name];  ? BACK SURGERY    ? 10 yrs ago or so-fusion in neck   ?  CERVICAL FUSION    ? COLONOSCOPY    ? 15-20 yrs ago-normal per pt.   ? COLONOSCOPY W/ POLYPECTOMY    ? CYSTOSCOPY    ? kidney stools removed   ? FEMORAL-POPLITEAL BYPASS GRAFT Right 07/24/2016  ? Procedure: RIGHT FEMORAL TO ABOVE KNEE POPLITEAL ARTERY BYPASS GRAFT;  Surgeon: Serafina Mitchell, MD;  [facility];  Service: Vascular;  Laterality: Right;  ? LUMBAR LAMINECTOMY/DECOMPRESSION MICRODISCECTOMY Bilateral 12/03/2015  ? Procedure: Microdiscectomy - bilateral - Lumbar four-lumbar five;  Surgeon: Earnie Larsson, MD;  Location: [facility];  Service: Neurosurgery;  Laterality: Bilateral;  ? PERIPHERAL VASCULAR INTERVENTION Right 06/09/2016  ? Procedure: Peripheral Vascular Intervention;  Surgeon: Serafina Mitchell, MD;  Location: [facility];  Service: Cardiovascular;  Laterality: Right;  SFA  ? REPAIR EXTENSOR TENDON Left 10/12/2018  ? Procedure: TENDON SHEATH RELEASE/TENOLYSIS;  Surgeon: Leandrew Koyanagi, MD;  Location: [facility];  Service: Orthopedics;  Laterality: Left;  ? teeth removal    ? ? ? ? ? ?Home Medications   ? ?Prior to Admission medications   ?Medication Sig Start Date End Date Taking? Authorizing Provider  ?amLODipine (NORVASC) 10 MG tablet Take 1 tablet (10 mg total) by mouth daily. 05/22/21   Eulogio Bear, NP  ?aspirin EC 81 MG tablet Take 81 mg by mouth daily. Swallow whole.    [provider]  ?clopidogrel (PLAVIX) 75 MG  tablet Take 1 tablet (75 mg total) by mouth daily. 05/22/21   Eulogio Bear, NP  ?fluticasone (FLONASE) 50 MCG/ACT nasal spray Place 2 sprays into both nostrils daily. ?Patient not taking: Reported on 05/15/2019 10/20/18   [name], MD  ?ipratropium (ATROVENT) 0.06 % nasal spray Place 1-2 sprays into both nostrils 4 (four) times daily. As needed for nasal congestion ?Patient not taking: Reported on 05/15/2019 11/14/18   [name], MD  ?lovastatin (MEVACOR) 20 MG tablet Take 1 tablet (20 mg total) by mouth at bedtime. 07/13/18   [name], MD  ?amitriptyline (ELAVIL) 50 MG tablet Take 1 tablet (50 mg total) by mouth at bedtime. 11/14/18 04/26/20  [name], MD  ?ramipril (ALTACE) 2.5 MG capsule Take 1 capsule (2.5 mg total) by mouth daily. 03/13/19 04/26/20  [name], MD  ? ? ?Family History ?Family History  ?Problem Relation Age of Onset  ? Colon cancer Neg Hx   ? Rectal cancer Neg Hx   ? Stomach cancer Neg Hx   ? Esophageal cancer Neg Hx   ? ? ?Social History ?Social History  ? ?Tobacco Use  ? Smoking status: Every Day  ?  Packs/day: 0.50  ?  Years: 47.00  ?  Pack years: 23.50  ?  Types: Cigarettes  ? Smokeless tobacco: Never  ? Tobacco comments:  ?  advised to hold for 24 hours prior  ?Vaping Use  ? Vaping Use: Never used  ?Substance Use Topics  ? Alcohol use: No  ?  Alcohol/week: 0.0 standard drinks  ?  Comment: no alcohol in 23 years (12/02/15  ? Drug use: No  ? ? ? ?Allergies   ?No known allergies ? ? ?Review of Systems ?Review of Systems ?Per HPI ? ?Physical Exam ?Triage Vital Signs ?ED Triage Vitals  ?Enc Vitals Group  ?   BP 05/22/21 1332 (!) 180/72  ?   Pulse Rate 05/22/21 1332 66  ?   Resp 05/22/21 1332 20  ?   Temp 05/22/21 1332 98.2 ?F (36.8 ?C)  ?   Temp Source 05/22/21 1332 Oral  ?   SpO2 05/22/21 1332 98 %  ?   Weight --   ?   Height --   ?   Head Circumference --   ?   Peak Flow --   ?   Pain Score 05/22/21 1333 0  ?   Pain Loc --   ?   Pain Edu? --   ?   Excl. in [place]? --   ? ?No data found. ? ?Updated Vital Signs ?BP (!) 180/72 (BP Location: Right Arm)   Pulse 66   Temp 98.2 ?F (36.8 ?C) (Oral)   Resp 20   SpO2 98%  ? ?Visual Acuity ?Right Eye Distance:   ?Left Eye Distance:   ?Bilateral Distance:   ? ?Right Eye Near:   ?Left Eye Near:    ?Bilateral Near:    ? ?Physical Exam ?Vitals and nursing note reviewed.  ?Constitutional:   ?   General: He is not in acute distress. ?   Appearance: Normal appearance. He is not toxic-appearing.  ?HENT:  ?   Head: Normocephalic and atraumatic.  ?Eyes:  ?   General: No  scleral icterus. ?   Extraocular Movements: Extraocular movements intact.  ?Cardiovascular:  ?   Rate and Rhythm: Normal rate and regular rhythm.  ?Pulmonary:  ?   Effort: Pulmonary effort is normal. No respiratory distress.  ?  Breath sounds: Normal breath sounds. No wheezing, rhonchi or rales.  ?Musculoskeletal:  ?   Cervical back: Normal range of motion and neck supple.  ?Skin: ?   General: Skin is warm and dry.  ?   Coloration: Skin is not jaundiced or pale.  ?   Findings: No erythema.  ?Neurological:  ?   Mental Status: He is alert and oriented to person, place, and time.  ? ? ? ?UC Treatments / Results  ?Labs ?(all labs ordered are listed, but only abnormal results are displayed) ?Labs Reviewed - No data to display ? ?EKG ? ? ?Radiology ?No results found. ? ?Procedures ?Procedures (including critical care time) ? ?Medications Ordered in UC ?Medications - No data to display ? ?Initial Impression / Assessment and Plan / UC Course  ?I have reviewed the triage vital signs and the nursing notes. ? ?Pertinent labs & imaging results that were available during my care of the patient were reviewed by me and considered in my medical decision making (see chart for details). ? ?  ?Will refill medications today including amlodipine 10 mg daily and Plavix 75 mg daily each for a 30 day supply.  Discussed side effects to watch for with resuming medication.  With any new chest pain, shortness of breath, lightheadedness or dizziness, go to ER.  Strongly encouraged him to keep appointment with future PCP.   ?Final Clinical Impressions(s) / UC Diagnoses  ? ?Final diagnoses:  ?Essential hypertension  ?PVD (peripheral vascular disease) with claudication ([place])  ? ?Discharge Instructions   ?None ?  ? ?ED Prescriptions   ? ? Medication Sig Dispense Auth. Provider  ? amLODipine (NORVASC) 10 MG tablet Take 1 tablet (10 mg total) by mouth daily. 30 tablet Noemi Chapel A, NP  ? clopidogrel (PLAVIX) 75 MG tablet Take 1 tablet (75 mg  total) by mouth daily. 30 tablet Eulogio Bear, NP  ? ?  ? ?PDMP not reviewed this encounter. ?  ?Eulogio Bear, NP ?05/22/21 1411 ? ?

## 2021-05-22 NOTE — ED Triage Notes (Signed)
Pt needing a refill of medication for Plavix and Amlodipine.  ?

## 2021-06-05 ENCOUNTER — Encounter: Payer: Self-pay | Admitting: Student

## 2021-06-05 ENCOUNTER — Ambulatory Visit (INDEPENDENT_AMBULATORY_CARE_PROVIDER_SITE_OTHER): Payer: Medicare HMO | Admitting: Student

## 2021-06-05 VITALS — BP 149/65 | HR 69 | Ht 69.0 in | Wt 154.2 lb

## 2021-06-05 DIAGNOSIS — J3089 Other allergic rhinitis: Secondary | ICD-10-CM | POA: Diagnosis not present

## 2021-06-05 DIAGNOSIS — I10 Essential (primary) hypertension: Secondary | ICD-10-CM | POA: Diagnosis not present

## 2021-06-05 DIAGNOSIS — Z131 Encounter for screening for diabetes mellitus: Secondary | ICD-10-CM

## 2021-06-05 DIAGNOSIS — T7840XA Allergy, unspecified, initial encounter: Secondary | ICD-10-CM | POA: Insufficient documentation

## 2021-06-05 DIAGNOSIS — I739 Peripheral vascular disease, unspecified: Secondary | ICD-10-CM

## 2021-06-05 DIAGNOSIS — J3489 Other specified disorders of nose and nasal sinuses: Secondary | ICD-10-CM

## 2021-06-05 DIAGNOSIS — E785 Hyperlipidemia, unspecified: Secondary | ICD-10-CM | POA: Diagnosis not present

## 2021-06-05 DIAGNOSIS — J329 Chronic sinusitis, unspecified: Secondary | ICD-10-CM | POA: Diagnosis not present

## 2021-06-05 LAB — POCT GLYCOSYLATED HEMOGLOBIN (HGB A1C): Hemoglobin A1C: 5.6 % (ref 4.0–5.6)

## 2021-06-05 MED ORDER — LOVASTATIN 20 MG PO TABS: 20.0000 mg | ORAL_TABLET | Freq: Every day | ORAL | 1 refills | Status: DC

## 2021-06-05 MED ORDER — LORATADINE 10 MG PO TABS: 10.0000 mg | ORAL_TABLET | Freq: Every day | ORAL | 11 refills | Status: DC

## 2021-06-05 MED ORDER — AMLODIPINE BESYLATE 10 MG PO TABS: 10.0000 mg | ORAL_TABLET | Freq: Every day | ORAL | 1 refills | Status: DC

## 2021-06-05 MED ORDER — CLOPIDOGREL BISULFATE 75 MG PO TABS: 75.0000 mg | ORAL_TABLET | Freq: Every day | ORAL | 1 refills | Status: DC

## 2021-06-05 MED ORDER — FLUTICASONE PROPIONATE 50 MCG/ACT NA SUSP: 2.0000 | Freq: Every day | NASAL | 6 refills | Status: DC

## 2021-06-05 NOTE — Progress Notes (Signed)
?  SUBJECTIVE:  ? ?CHIEF COMPLAINT / HPI:  ? ?Patient here for new patient visit to meet patient and discuss previous care/history ? ?Concerns ?Sinus symptoms:  ?6 months, runny nose like crazy, little cough, sometimes headache, some itchy eyes. No fever's,  ?-claritin and flonase ? ?PMH: ?HTN ?PAD ?Anxiety ?Maybe Depression ?No acid reflux ?Allergies ?Possibly OA ? ?Surgical History ?3-4 back surgeries ?2 Strokes - no deficets ?1 CABG ? ?Medications: ?Reviewed with patient in chart ? ?Family Hx: No cancer, DM, yes for HTN, COPD ?Social Hx: Lives with wife of 60 years and cares for her, seems to be significant burden as she is bed bound. Patient is not working and is on disability 2/2 his back surgeries and strokes ?No recreational drugs, no drinks in 25 years,  ?Smoking about a pack a day, 53 years, not interested in quitting.  ? ?PERTINENT  PMH / PSH: HTN, HLD, CVA w/o hemiparesis, anxiety ? ? ?OBJECTIVE:  ?BP (!) 149/65   Pulse 69   Ht '5\' 9"'$  (1.753 m)   Wt 154 lb 3.2 oz (69.9 kg)   SpO2 99%   BMI 22.77 kg/m?  ? ?General: NAD, pleasant, able to participate in exam ?Cardiac: RRR, no murmurs auscultated. ?Respiratory: CTAB, normal effort, no wheezes, rales or rhonchi ?Abdomen: soft, non-tender, non-distended, normoactive bowel sounds ?Extremities: warm and well perfused, no edema or cyanosis. ?Psych: Normal affect and mood ? ?ASSESSMENT/PLAN:  ?Allergies ?Patient complaining of months of runny nose with intermittent itchy eyes headaches and cough.  Denies any sick symptoms fever nausea vomiting chills.  Patient likely suffering from environmental allergies given symptoms and length of symptoms.  Less concerning for sinus infection as patient has not felt ill does not appear ill and is unlikely be ill for 6 months.  Patient also reports history of using Claritin that relieved nasal symptoms in the past. ?- Flonase, 2 spray each nare ?- Claritin 10 mg daily ? ?Health maintenance ?Obtain labs for health maintenance  screening for high cholesterol, type 2 diabetes, anemia, kidney function.  Patient to make follow-up appointment for health maintenance.  Refilled medications. ?  ?Orders Placed This Encounter  ?Procedures  ? Basic Metabolic Panel  ? Lipid Panel  ? CBC  ? HgB A1c  ? ?Meds ordered this encounter  ?Medications  ? clopidogrel (PLAVIX) 75 MG tablet  ?  Sig: Take 1 tablet (75 mg total) by mouth daily.  ?  Dispense:  90 tablet  ?  Refill:  1  ? fluticasone (FLONASE) 50 MCG/ACT nasal spray  ?  Sig: Place 2 sprays into both nostrils daily.  ?  Dispense:  16 g  ?  Refill:  6  ? lovastatin (MEVACOR) 20 MG tablet  ?  Sig: Take 1 tablet (20 mg total) by mouth at bedtime.  ?  Dispense:  90 tablet  ?  Refill:  1  ? amLODipine (NORVASC) 10 MG tablet  ?  Sig: Take 1 tablet (10 mg total) by mouth daily.  ?  Dispense:  90 tablet  ?  Refill:  1  ? loratadine (CLARITIN) 10 MG tablet  ?  Sig: Take 1 tablet (10 mg total) by mouth daily.  ?  Dispense:  30 tablet  ?  Refill:  11  ? ?No follow-ups on file. ?'@[place]'$ @ ? ?

## 2021-06-05 NOTE — Patient Instructions (Addendum)
It was great to see you! Thank you for allowing me to participate in your care! ? ?I recommend that you always bring your medications to each appointment as this makes it easy to ensure we are on the correct medications and helps [place] not miss when refills are needed. ? ?Our plans for today:  ?- Allergies ? Fluticasone ? Claritin ? ?- Health maintenance ? Patient to schedule follow up appointment to discuss health maitnence and other concerns.  ? Obtained screening labs at [facility] ? Refilled home meds ? ?We are checking some labs today, I will call you if they are abnormal will send you a MyChart message or a letter if they are normal.  If you do not hear about your labs in the next 2 weeks please let us know. ? ?Take care and seek immediate care sooner if you develop any concerns.  ? ?Dr. Holley Bouche, MD ?[place] ? ?

## 2021-06-05 NOTE — Assessment & Plan Note (Signed)
Patient complaining of months of runny nose with intermittent itchy eyes headaches and cough.  Denies any sick symptoms fever nausea vomiting chills.  Patient likely suffering from environmental allergies given symptoms and length of symptoms.  Less concerning for sinus infection as patient has not felt ill does not appear ill and is unlikely be ill for 6 months.  Patient also reports history of using Claritin that relieved nasal symptoms in the past. ?- Flonase, 2 spray each nare ?- Claritin 10 mg daily ?

## 2021-06-06 LAB — LIPID PANEL
Chol/HDL Ratio: 3.7 ratio (ref 0.0–5.0)
Cholesterol, Total: 161 mg/dL (ref 100–199)
HDL: 43 mg/dL (ref >39)
LDL Chol Calc (NIH): 100 mg/dL — ABNORMAL HIGH (ref 0–99)
Triglycerides: 94 mg/dL (ref 0–149)
VLDL Cholesterol Cal: 18 mg/dL (ref 5–40)

## 2021-06-06 LAB — CBC
Hematocrit: 40.6 % (ref 37.5–51.0)
Hemoglobin: 13.3 g/dL (ref 13.0–17.7)
MCH: 28.5 pg (ref 26.6–33.0)
MCHC: 32.8 g/dL (ref 31.5–35.7)
MCV: 87 fL (ref 79–97)
Platelets: 243 10*3/uL (ref 150–450)
RBC: 4.66 x10E6/uL (ref 4.14–5.80)
RDW: 13.6 % (ref 11.6–15.4)
WBC: 10 10*3/uL (ref 3.4–10.8)

## 2021-06-06 LAB — BASIC METABOLIC PANEL
BUN/Creatinine Ratio: 18 (ref 10–24)
BUN: 26 mg/dL (ref 8–27)
CO2: 24 mmol/L (ref 20–29)
Calcium: 9.1 mg/dL (ref 8.6–10.2)
Chloride: 103 mmol/L (ref 96–106)
Creatinine, Ser: 1.41 mg/dL — ABNORMAL HIGH (ref 0.76–1.27)
Glucose: 116 mg/dL — ABNORMAL HIGH (ref 70–99)
Potassium: 4.5 mmol/L (ref 3.5–5.2)
Sodium: 142 mmol/L (ref 134–144)
eGFR: 55 mL/min/{1.73_m2} — ABNORMAL LOW (ref >59)

## 2021-06-13 ENCOUNTER — Encounter: Payer: Self-pay | Admitting: Student

## 2021-06-18 ENCOUNTER — Telehealth: Payer: Self-pay | Admitting: Student

## 2021-06-18 NOTE — Telephone Encounter (Signed)
Patient called asking if the Handicap tag paperwork was completed. I looked up front and I didn't see it. Patient stated the doctor said he would do it when he had a visit on the 11th. He asked if it could be done quick as possible due to him being disabled.   He would like a call when paperwork is completed. We have handicap tag forms up front.   Please advise.  Thanks!

## 2021-06-27 ENCOUNTER — Telehealth: Payer: Self-pay | Admitting: Student

## 2021-06-27 NOTE — Telephone Encounter (Signed)
Attempted to call patient. Patient [facility] Disability placard form filled out and left in RN box. Please call patient and inform him.

## 2021-06-27 NOTE — Telephone Encounter (Signed)
Form placed up front for pick.   Copy made for batch scanning.   Unable to reach patient.

## 2022-03-26 ENCOUNTER — Encounter (HOSPITAL_COMMUNITY): Payer: Self-pay

## 2022-03-26 ENCOUNTER — Ambulatory Visit (HOSPITAL_COMMUNITY)
Admission: EM | Admit: 2022-03-26 | Discharge: 2022-03-26 | Disposition: A | Discharge status: Home / Self Care | Payer: Medicare HMO | Attending: Family Medicine | Admitting: Family Medicine

## 2022-03-26 ENCOUNTER — Other Ambulatory Visit: Payer: Self-pay | Admitting: Student

## 2022-03-26 DIAGNOSIS — I10 Essential (primary) hypertension: Secondary | ICD-10-CM

## 2022-03-26 MED ORDER — AMLODIPINE BESYLATE 10 MG PO TABS: 10.0000 mg | ORAL_TABLET | Freq: Every day | ORAL | 1 refills | Status: AC

## 2022-03-26 NOTE — Discharge Instructions (Signed)
Continue amlodipine 10 mg--1 daily for blood pressure  Consider getting a weekly pill planner so you can load your pills and there and see when you have taken them or not.  I am glad you are going to get established with a new primary care

## 2022-03-26 NOTE — ED Provider Notes (Signed)
[place]    CSN: [id number] Arrival date & time: 03/26/22  1623      History   Chief Complaint Chief Complaint  Patient presents with   Medication Refill    HPI Roy Moore is a 67 y.o. male.    Medication Refill  Here for hypertension.  He is taking amlodipine 10 mg 1 daily.  His last dose in his supply was this morning.  Blood pressure this afternoon at triage was 195/68.  His last prescription was done in May 2023 for a 90-day supply with 1 refill.  The patient not reviewed the information on his current bottle.  It was filled in September 2023 for 90-day supply.  We discussed that he must of been missing some doses.  He allows that that is quite possible.  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  She just recently passed away.  He is working on Arts development officer with [employer] for his primary care, but does not quite have an appointment yet.  He states he has some Plavix still and supply.  Past Medical History:  Diagnosis Date   Anxiety    Hyperlipidemia    Peripheral vascular disease ([place])    Stroke ([place]) 2009   no residual    Patient Active Problem List   Diagnosis Date Noted   Allergies 06/05/2021   Extensor tenosynovitis of left wrist 09/20/2018   PAD (peripheral artery disease) ([place]) 07/24/2016   Peripheral arterial disease ([place]) 07/17/2016   Lumbar stenosis with neurogenic claudication 12/03/2015   CVA, old, hemiparesis ([place]) 03/24/2011   ANXIETY DISORDER 10/04/2007   TOBACCO ABUSE 09/30/2007   GERD 09/30/2007   DYSLIPIDEMIA 09/15/2007   Hemiplegia, 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    COLONOSCOPY W/ POLYPECTOMY     CYSTOSCOPY     kidney stools removed    FEMORAL-POPLITEAL BYPASS GRAFT Right 07/24/2016   Procedure: RIGHT FEMORAL TO ABOVE KNEE POPLITEAL ARTERY BYPASS GRAFT;  Surgeon: Serafina Mitchell, MD;  [facility]: [facility];  Service: Vascular;  Laterality: Right;   LUMBAR LAMINECTOMY/DECOMPRESSION MICRODISCECTOMY Bilateral 12/03/2015   Procedure: Microdiscectomy - bilateral - Lumbar four-lumbar five;  Surgeon: Earnie Larsson, MD;  Location: [facility];  Service: Neurosurgery;  Laterality: Bilateral;   PERIPHERAL VASCULAR INTERVENTION Right 06/09/2016   Procedure: Peripheral Vascular Intervention;  Surgeon: Serafina Mitchell, MD;  Location: [facility];  Service: Cardiovascular;  Laterality: Right;  SFA   REPAIR EXTENSOR TENDON Left 10/12/2018   Procedure: TENDON SHEATH RELEASE/TENOLYSIS;  Surgeon: Leandrew Koyanagi, MD;  Location: [facility];  Service: Orthopedics;  Laterality: Left;   teeth removal         Home Medications    Prior to Admission medications   Medication Sig Start Date End Date Taking? Authorizing Provider  amLODipine (NORVASC) 10 MG tablet Take 1 tablet (10 mg total) by mouth daily. 03/26/22   Barrett Henle, MD  aspirin EC 81 MG tablet Take 81 mg by mouth daily. Swallow whole. Patient  not taking: Reported on 06/05/2021    [provider]  clopidogrel (PLAVIX) 75 MG tablet Take 1 tablet (75 mg total) by mouth daily. 06/05/21   Holley Bouche, MD  fluticasone (FLONASE) 50 MCG/ACT nasal spray Place 2 sprays into both nostrils daily. 06/05/21   Holley Bouche, MD  ipratropium (ATROVENT) 0.06 % nasal spray Place 1-2 sprays into both nostrils 4 (four) times daily. As needed for nasal congestion Patient not taking: Reported on 05/15/2019 11/14/18   [name], MD  loratadine (CLARITIN) 10 MG tablet Take 1 tablet (10 mg total) by mouth daily. 06/05/21   Holley Bouche, MD  lovastatin (MEVACOR) 20 MG tablet Take 1 tablet (20 mg total) by mouth at bedtime. 06/05/21   Holley Bouche, MD  amitriptyline (ELAVIL) 50 MG tablet Take 1 tablet (50 mg total) by mouth at bedtime. 11/14/18 04/26/20  [name], MD  ramipril (ALTACE) 2.5 MG capsule Take 1 capsule (2.5 mg total) by mouth daily. 03/13/19 04/26/20  [name], MD    Family History Family History  Problem Relation Age of Onset   Colon cancer Neg Hx    Rectal cancer Neg Hx    Stomach cancer Neg Hx    Esophageal cancer Neg Hx     Social History Social History   Tobacco Use   Smoking status: Every Day    Packs/day: 0.50    Years: 47.00    Total pack years: 23.50    Types: Cigarettes   Smokeless tobacco: Never   Tobacco comments:    advised to hold for 24 hours prior  Vaping Use   Vaping Use: Never used  Substance Use Topics   Alcohol use: No    Alcohol/week: 0.0 standard drinks of alcohol    Comment: no alcohol in 23 years (12/02/15   Drug use: No     Allergies   No known allergies   Review of Systems Review of Systems   Physical Exam Triage Vital Signs ED Triage Vitals [03/26/22 1805]  Enc Vitals Group     BP (!) 195/68     Pulse Rate 87     Resp 18     Temp 97.8 F (36.6 C)     Temp src      SpO2 99 %     Weight      Height      Head Circumference      Peak Flow      Pain Score      Pain Loc      Pain Edu?      Excl. in [place]?    No data found.  Updated Vital Signs BP (!) 195/68 (BP Location: Left Arm)   Pulse 87   Temp 97.8 F (36.6 C)   Resp 18   SpO2 99%   Visual Acuity Right Eye Distance:   Left Eye Distance:   Bilateral Distance:    Right Eye Near:   Left Eye Near:    Bilateral Near:     Physical Exam Vitals reviewed.  Constitutional:      General: He is not in acute distress.    Appearance: He is not ill-appearing, toxic-appearing or diaphoretic.  HENT:     Mouth/Throat:     Mouth: Mucous membranes are moist.     Pharynx:  No oropharyngeal exudate or posterior oropharyngeal erythema.  Eyes:     Extraocular Movements: Extraocular movements intact.     Conjunctiva/sclera: Conjunctivae normal.  Pupils: Pupils are equal, round, and reactive to light.  Cardiovascular:     Rate and Rhythm: Normal rate and regular rhythm.     Heart sounds: No murmur heard. Pulmonary:     Breath sounds: Normal breath sounds.  Musculoskeletal:     Cervical back: Neck supple.  Lymphadenopathy:     Cervical: No cervical adenopathy.  Skin:    Coloration: Skin is not jaundiced or pale.  Neurological:     General: No focal deficit present.     Mental Status: He is alert and oriented to person, place, and time.  Psychiatric:        Behavior: Behavior normal.      UC Treatments / Results  Labs (all labs ordered are listed, but only abnormal results are displayed) Labs Reviewed - No data to display  EKG   Radiology No results found.  Procedures Procedures (including critical care time)  Medications Ordered in UC Medications - No data to display  Initial Impression / Assessment and Plan / UC Course  I have reviewed the triage vital signs and the nursing notes.  Pertinent labs & imaging results that were available during my care of the patient were reviewed by me and considered in my medical decision making (see chart for details).        We discussed methods for helping him be compliant with daily administration of his blood pressure medication.  Amlodipine 10 mg is sent in for 30-day supply +1 refill, to give him time to establish with a new primary care.  He will continue his Plavix also Final Clinical Impressions(s) / UC Diagnoses   Final diagnoses:  Essential hypertension, benign     Discharge Instructions      Continue amlodipine 10 mg--1 daily for blood pressure  Consider getting a weekly pill planner so you can load your pills and there and see when you have taken them or not.  I am glad you  are going to get established with a new primary care     ED Prescriptions     Medication Sig Dispense Auth. Provider   amLODipine (NORVASC) 10 MG tablet Take 1 tablet (10 mg total) by mouth daily. 30 tablet Abe Schools, Gwenlyn Perking, MD      PDMP not reviewed this encounter.   Barrett Henle, MD 03/26/22 [phone]

## 2022-03-26 NOTE — ED Triage Notes (Signed)
Pt is here for med refill on Amlodipine 10 mg. Last dose was this morning.

## 2022-04-13 ENCOUNTER — Telehealth: Payer: Self-pay | Admitting: Student

## 2022-04-13 NOTE — Telephone Encounter (Signed)
Called patient to schedule Medicare Annual Wellness Visit (AWV). No voicemail available to leave a message.  Last date of AWV: 02/09/2019   Please schedule an AWVS appointment at any time with [facility].  If any questions, please contact me at [phone].   Thank you,  [place] Direct dial  [phone]

## 2022-05-11 ENCOUNTER — Emergency Department (HOSPITAL_COMMUNITY)
Admission: EM | Admit: 2022-05-11 | Discharge: 2022-05-11 | Disposition: A | Discharge status: Home / Self Care | Payer: Medicare HMO | Attending: Emergency Medicine | Admitting: Emergency Medicine

## 2022-05-11 ENCOUNTER — Emergency Department (HOSPITAL_COMMUNITY): Payer: Medicare HMO

## 2022-05-11 DIAGNOSIS — Z0101 Encounter for examination of eyes and vision with abnormal findings: Secondary | ICD-10-CM | POA: Insufficient documentation

## 2022-05-11 DIAGNOSIS — F17210 Nicotine dependence, cigarettes, uncomplicated: Secondary | ICD-10-CM | POA: Diagnosis not present

## 2022-05-11 DIAGNOSIS — Z7982 Long term (current) use of aspirin: Secondary | ICD-10-CM | POA: Diagnosis not present

## 2022-05-11 DIAGNOSIS — Z8673 Personal history of transient ischemic attack (TIA), and cerebral infarction without residual deficits: Secondary | ICD-10-CM | POA: Insufficient documentation

## 2022-05-11 DIAGNOSIS — R519 Headache, unspecified: Secondary | ICD-10-CM | POA: Diagnosis present

## 2022-05-11 DIAGNOSIS — Z7902 Long term (current) use of antithrombotics/antiplatelets: Secondary | ICD-10-CM | POA: Insufficient documentation

## 2022-05-11 LAB — BASIC METABOLIC PANEL
Anion gap: 9 (ref 5–15)
BUN: 14 mg/dL (ref 8–23)
CO2: 22 mmol/L (ref 22–32)
Calcium: 8.4 mg/dL — ABNORMAL LOW (ref 8.9–10.3)
Chloride: 105 mmol/L (ref 98–111)
Creatinine, Ser: 1.26 mg/dL — ABNORMAL HIGH (ref 0.61–1.24)
GFR, Estimated: >60 mL/min (ref >60)
Glucose, Bld: 112 mg/dL — ABNORMAL HIGH (ref 70–99)
Potassium: 3 mmol/L — ABNORMAL LOW (ref 3.5–5.1)
Sodium: 136 mmol/L (ref 135–145)

## 2022-05-11 LAB — CBC WITH DIFFERENTIAL/PLATELET
Abs Immature Granulocytes: 0.08 10*3/uL — ABNORMAL HIGH (ref 0.00–0.07)
Basophils Absolute: 0.1 10*3/uL (ref 0.0–0.1)
Basophils Relative: 1 %
Eosinophils Absolute: 0.4 10*3/uL (ref 0.0–0.5)
Eosinophils Relative: 3 %
HCT: 38.2 % — ABNORMAL LOW (ref 39.0–52.0)
Hemoglobin: 12.6 g/dL — ABNORMAL LOW (ref 13.0–17.0)
Immature Granulocytes: 1 %
Lymphocytes Relative: 8 %
Lymphs Abs: 1 10*3/uL (ref 0.7–4.0)
MCH: 28.4 pg (ref 26.0–34.0)
MCHC: 33 g/dL (ref 30.0–36.0)
MCV: 86 fL (ref 80.0–100.0)
Monocytes Absolute: 0.8 10*3/uL (ref 0.1–1.0)
Monocytes Relative: 6 %
Neutro Abs: 10.4 10*3/uL — ABNORMAL HIGH (ref 1.7–7.7)
Neutrophils Relative %: 81 %
Platelets: 391 10*3/uL (ref 150–400)
RBC: 4.44 MIL/uL (ref 4.22–5.81)
RDW: 12.6 % (ref 11.5–15.5)
WBC: 12.6 10*3/uL — ABNORMAL HIGH (ref 4.0–10.5)
nRBC: 0 % (ref 0.0–0.2)

## 2022-05-11 LAB — PROTIME-INR
INR: 1 (ref 0.8–1.2)
Prothrombin Time: 13.5 seconds (ref 11.4–15.2)

## 2022-05-11 MED ORDER — GADOBUTROL 1 MMOL/ML IV SOLN
7.0000 mL | Freq: Once | INTRAVENOUS | Status: AC | PRN
  Administered 2022-05-11: 7 mL via INTRAVENOUS

## 2022-05-11 MED ORDER — LORAZEPAM 1 MG PO TABS
1.0000 mg | ORAL_TABLET | ORAL | Status: DC | PRN
  Administered 2022-05-11: 1 mg via ORAL
  Filled 2022-05-11: qty 1

## 2022-05-11 NOTE — ED Provider Notes (Addendum)
  Physical Exam  BP 132/68   Pulse 69   Temp 98.1 F (36.7 C)   Resp 16   SpO2 96%   Physical Exam  Procedures  Procedures  ED Course / MDM   Clinical Course as of 05/11/22 1613  Mon May 11, 2022  1522 Signed out to oncoming physician pending MRI. Can be discharged if negative.  [WS]    Clinical Course User Index [WS] Lonell Grandchild, MD   Medical Decision Making Amount and/or Complexity of Data Reviewed Labs: ordered. Radiology: ordered.  Risk Prescription drug management.   Pt comes in with cc of vision change. Optometrist sent patient to the ER due to visual field defects. Pt has no complains. Dr. Sallyanne Kuster didn't have clear objective deficits.  MRI brain w/ and w/o ordered. IF MRI neg - d/c with pcp follow up and optometrist f/u.   Derwood Kaplan, MD 05/11/22 1614   7:59 PM MRI brain negative for acute finding.  I tried to reassess the patient and reexamine him, but he did not want repeat ocular exam.  I have requested that he follows up with optometrist that sent him to the emergency room and for space.  Ophthalmology and neurology information has been provided in case patient needs it.   Derwood Kaplan, MD 05/11/22 2000

## 2022-05-11 NOTE — ED Provider Notes (Signed)
[place] EMERGENCY DEPARTMENT AT [facility] Provider Note  CSN: [id number] Arrival date & time: 05/11/22 4098  Chief Complaint(s) Headache  HPI Roy Moore is a 67 y.o. male with prior stroke, hyperlipidemia presenting with apparent outside ocular exam.  Patient reports that he was visiting optometrist to get his renewal of his driver's license.  He was told by the optometrist he should come to the emergency department to evaluate for brain tumor or aneurysm.  He reports that he feels fine.  He denies any vision changes.  He denies any numbness, weakness, tingling, headaches, chest pain, shortness of breath, abdominal pain, dizziness, trouble swallowing, trouble breathing, any other symptoms.  He essentially has no complaints but his ophthalmologist would not sign off on his paperwork so he came to get further testing.  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   Past Medical History Past Medical History:  Diagnosis Date   Anxiety    Hyperlipidemia    Peripheral vascular disease ([facility])    Stroke ([facility]) 2009   no residual   Patient Active Problem List   Diagnosis Date Noted   Allergies 06/05/2021   Extensor tenosynovitis of left wrist 09/20/2018   PAD (peripheral artery disease) 07/24/2016   Peripheral arterial disease 07/17/2016   Lumbar stenosis with neurogenic claudication 12/03/2015   CVA, old, hemiparesis 03/24/2011   ANXIETY DISORDER 10/04/2007   TOBACCO ABUSE 09/30/2007   GERD 09/30/2007   DYSLIPIDEMIA 09/15/2007   Hemiplegia, late effect of cerebrovascular disease ([facility]) 09/15/2007   Home Medication(s) Prior to Admission medications   Medication Sig Start Date End Date Taking? Authorizing Provider  amLODipine (NORVASC) 10 MG tablet Take 1 tablet (10 mg total) by mouth daily. 03/26/22   Zenia Resides, MD  aspirin EC 81 MG tablet Take 81 mg by mouth daily. Swallow whole. Patient not taking:  Reported on 06/05/2021    [provider]  clopidogrel (PLAVIX) 75 MG tablet Take 1 tablet (75 mg total) by mouth daily. 06/05/21   Bess Kinds, MD  fluticasone (FLONASE) 50 MCG/ACT nasal spray Place 2 sprays into both nostrils daily. 06/05/21   Bess Kinds, MD  ipratropium (ATROVENT) 0.06 % nasal spray Place 1-2 sprays into both nostrils 4 (four) times daily. As needed for nasal congestion Patient not taking: Reported on 05/15/2019 11/14/18   Shade Flood, MD  loratadine (CLARITIN) 10 MG tablet Take 1 tablet (10 mg total) by mouth daily. 06/05/21   Bess Kinds, MD  lovastatin (MEVACOR) 20 MG tablet Take 1 tablet (20 mg total) by mouth at bedtime. 06/05/21   Bess Kinds, MD  amitriptyline (ELAVIL) 50 MG tablet Take 1 tablet (50 mg total) by mouth at bedtime. 11/14/18 04/26/20  Shade Flood, MD  ramipril (ALTACE) 2.5 MG capsule Take 1 capsule (2.5 mg total) by mouth daily. 03/13/19 04/26/20  Shade Flood, MD  Past Surgical History Past Surgical History:  Procedure Laterality Date   ABDOMINAL AORTOGRAM W/LOWER EXTREMITY [name] 06/09/2016   Procedure: Abdominal Aortogram w/Lower Extremity;  Surgeon: Nada Libman, MD;  [facility];  Service: Cardiovascular;  Laterality: [name];   BACK SURGERY     10 yrs ago or so-fusion in neck    CERVICAL FUSION     COLONOSCOPY     15-20 yrs ago-normal per pt.    COLONOSCOPY W/ POLYPECTOMY     CYSTOSCOPY     kidney stools removed    FEMORAL-POPLITEAL BYPASS GRAFT Right 07/24/2016   Procedure: RIGHT FEMORAL TO ABOVE KNEE POPLITEAL ARTERY BYPASS GRAFT;  Surgeon: Nada Libman, MD;  Location: [facility];  Service: Vascular;  Laterality: Right;   LUMBAR LAMINECTOMY/DECOMPRESSION MICRODISCECTOMY Bilateral 12/03/2015   Procedure: Microdiscectomy - bilateral - Lumbar four-lumbar five;  Surgeon: Julio Sicks, MD;  [facility];  Service: Neurosurgery;  Laterality: Bilateral;   PERIPHERAL VASCULAR INTERVENTION Right 06/09/2016   Procedure: Peripheral Vascular Intervention;  Surgeon: Nada Libman, MD;  [facility];  Service: Cardiovascular;  Laterality: Right;  SFA   REPAIR EXTENSOR TENDON Left 10/12/2018   Procedure: TENDON SHEATH RELEASE/TENOLYSIS;  Surgeon: Tarry Kos, MD;  Location: [facility];  Service: Orthopedics;  Laterality: Left;   teeth removal     Family History Family History  Problem Relation Age of Onset   Colon cancer Neg Hx    Rectal cancer Neg Hx    Stomach cancer Neg Hx    Esophageal cancer Neg Hx     Social History Social History   Tobacco Use   Smoking status: Every Day    Packs/day: 0.50    Years: 47.00    Additional pack years: 0.00    Total pack years: 23.50    Types: Cigarettes   Smokeless tobacco: Never   Tobacco comments:    advised to hold for 24 hours prior  Vaping Use   Vaping Use: Never used  Substance Use Topics   Alcohol use: No    Alcohol/week: 0.0 standard drinks of alcohol    Comment: no alcohol in 23 years (12/02/15   Drug use: No   Allergies No known allergies  Review of Systems Review of Systems  All other systems reviewed and are negative.   Physical Exam Vital Signs  I have reviewed the triage vital signs BP 132/68   Pulse 69   Temp 98.1 F (36.7 C)   Resp 16   SpO2 96%  Physical Exam Vitals and nursing note reviewed.  Constitutional:      General: He is not in acute distress.    Appearance: Normal appearance.  HENT:     Mouth/Throat:     Mouth: Mucous membranes are moist.  Eyes:     Conjunctiva/sclera: Conjunctivae normal.  Cardiovascular:     Rate and Rhythm: Normal rate and regular rhythm.  Pulmonary:     Effort: Pulmonary effort is normal. No respiratory distress.     Breath sounds: Normal breath sounds.  Abdominal:     General: Abdomen is flat.     Palpations:  Abdomen is soft.     Tenderness: There is no abdominal tenderness.  Musculoskeletal:     Right lower leg: No edema.     Left lower leg: No edema.  Skin:    General: Skin is warm and dry.     Capillary Refill: Capillary refill takes less than 2 seconds.  Neurological:     Mental Status: He is alert and oriented to person, place, and time. Mental status is at baseline.     Comments: Cranial nerves II through XII intact, no visual field deficit on my exam, strength 5 out of 5 in the bilateral upper and lower extremities, no sensory deficit to light touch, no dysmetria on finger-nose-finger testing, ambulatory with steady gait.   Psychiatric:        Mood and Affect: Mood normal.        Behavior: Behavior normal.     ED Results and Treatments Labs (all labs ordered are listed, but only abnormal results are displayed) Labs Reviewed  BASIC METABOLIC PANEL - Abnormal; Notable for the following components:      Result Value   Potassium 3.0 (*)    Glucose, Bld 112 (*)    Creatinine, Ser 1.26 (*)    Calcium 8.4 (*)    All other components within normal limits  CBC WITH DIFFERENTIAL/PLATELET - Abnormal; Notable for the following components:   WBC 12.6 (*)    Hemoglobin 12.6 (*)    HCT 38.2 (*)    Neutro Abs 10.4 (*)    Abs Immature Granulocytes 0.08 (*)    All other components within normal limits  PROTIME-INR                                                                                                                          Radiology No results found.  Pertinent labs & imaging results that were available during my care of the patient were reviewed by me and considered in my medical decision making (see MDM for details).  Medications Ordered in ED Medications - No data to display                                                                                                                                   Procedures Procedures  (including critical care time)  Medical  Decision Making / ED Course   MDM:  67 year old male presenting to the emergency department with abnormal outside visual exam.  Patient well-appearing, he denies any medical complaints.  He reports subjectively his vision is normal.  I was unable to appreciate a visual field deficit on my exam.  Given age, reported abnormal findings, will obtain MRI brain with and without contrast to evaluate for potential causes including pituitary process, stroke.  If MRI is unremarkable likely discharge given that I cannot really appreciate any findings and patient reports that he feels at baseline.  Clinical Course as of 05/11/22 1522  Mon May 11, 2022  1522 Signed out to oncoming physician pending MRI. Can be discharged if negative.  [WS]    Clinical Course User Index [WS] Lonell Grandchild, MD     Additional history obtained: -Additional history obtained from family -External records from outside source obtained and reviewed including: Chart review including previous notes, labs, imaging, consultation notes including notes patient brought from his optometrist   Lab Tests: -I ordered, reviewed, and interpreted labs.   The pertinent results include:   Labs Reviewed  BASIC METABOLIC PANEL - Abnormal; Notable for the following components:      Result Value   Potassium 3.0 (*)    Glucose, Bld 112 (*)    Creatinine, Ser 1.26 (*)    Calcium 8.4 (*)    All other components within normal limits  CBC WITH DIFFERENTIAL/PLATELET - Abnormal; Notable for the following components:   WBC 12.6 (*)    Hemoglobin 12.6 (*)    HCT 38.2 (*)    Neutro Abs 10.4 (*)    Abs Immature Granulocytes 0.08 (*)    All other components within normal limits  PROTIME-INR    Notable for non-specific leukocytosis    Medicines ordered and prescription drug management: No orders of the defined types were placed in this encounter.   -I have reviewed the patients home medicines and have made adjustments as  needed  Social Determinants of Health:  Diagnosis or treatment significantly limited by social determinants of health: current smoker  Co morbidities that complicate the patient evaluation  Past Medical History:  Diagnosis Date   Anxiety    Hyperlipidemia    Peripheral vascular disease ([facility])    Stroke ([facility]) 2009   no residual      Dispostion: Disposition decision including need for hospitalization was considered, and patient disposition pending at time of sign out.    Final Clinical Impression(s) / ED Diagnoses Final diagnoses:  Abnormal visual test     This chart was dictated using voice recognition software.  Despite best efforts to proofread,  errors can occur which can change the documentation meaning.    Lonell Grandchild, MD 05/11/22 1524

## 2022-05-11 NOTE — ED Triage Notes (Signed)
Patient referred to ED by his optometrist while getting examined for new glasses last week. Patient states during the exam the optometrist told him he may have a blood clot causing his intermittent headaches that started a few months ago. Patient reports he had previously attributed the headaches to stress from the passing of his wife in November 2023.

## 2022-05-11 NOTE — Discharge Instructions (Signed)
We saw in the emergency room for abnormal vision exam.  MRI of your brain is negative for any stroke.  There was evidence of old stroke, you should have this finding discussed with your primary care doctor so that they can work on preventing future strokes.  We recommend you follow-up with your eye doctor for further management.  If they think you need to see an MD, then call Dr. Vanessa Barbara at the number provided.  He is an ophthalmologist.  If they think you need to see a neurologist, then call [place] [facility] at the number provided.

## 2022-06-07 ENCOUNTER — Other Ambulatory Visit: Payer: Self-pay

## 2022-06-07 ENCOUNTER — Observation Stay (HOSPITAL_COMMUNITY)
Admission: EM | Admit: 2022-06-07 | Discharge: 2022-06-08 | Disposition: A | Discharge status: Home / Self Care | Payer: Medicare HMO | Attending: Family Medicine | Admitting: Family Medicine

## 2022-06-07 ENCOUNTER — Emergency Department (HOSPITAL_COMMUNITY): Payer: Medicare HMO

## 2022-06-07 DIAGNOSIS — F17200 Nicotine dependence, unspecified, uncomplicated: Secondary | ICD-10-CM | POA: Diagnosis not present

## 2022-06-07 DIAGNOSIS — I10 Essential (primary) hypertension: Secondary | ICD-10-CM | POA: Diagnosis not present

## 2022-06-07 DIAGNOSIS — Z1152 Encounter for screening for COVID-19: Secondary | ICD-10-CM | POA: Insufficient documentation

## 2022-06-07 DIAGNOSIS — Z79899 Other long term (current) drug therapy: Secondary | ICD-10-CM | POA: Diagnosis not present

## 2022-06-07 DIAGNOSIS — E785 Hyperlipidemia, unspecified: Secondary | ICD-10-CM | POA: Diagnosis present

## 2022-06-07 DIAGNOSIS — Z8673 Personal history of transient ischemic attack (TIA), and cerebral infarction without residual deficits: Secondary | ICD-10-CM | POA: Diagnosis not present

## 2022-06-07 DIAGNOSIS — J189 Pneumonia, unspecified organism: Principal | ICD-10-CM

## 2022-06-07 DIAGNOSIS — Z7902 Long term (current) use of antithrombotics/antiplatelets: Secondary | ICD-10-CM | POA: Insufficient documentation

## 2022-06-07 DIAGNOSIS — R0602 Shortness of breath: Secondary | ICD-10-CM | POA: Diagnosis present

## 2022-06-07 DIAGNOSIS — N179 Acute kidney failure, unspecified: Secondary | ICD-10-CM

## 2022-06-07 DIAGNOSIS — I69359 Hemiplegia and hemiparesis following cerebral infarction affecting unspecified side: Secondary | ICD-10-CM

## 2022-06-07 DIAGNOSIS — R079 Chest pain, unspecified: Secondary | ICD-10-CM

## 2022-06-07 LAB — TROPONIN I (HIGH SENSITIVITY)
Troponin I (High Sensitivity): 15 ng/L (ref <18)
Troponin I (High Sensitivity): 16 ng/L (ref <18)

## 2022-06-07 LAB — CBC
HCT: 37.6 % — ABNORMAL LOW (ref 39.0–52.0)
HCT: 34.9 % — ABNORMAL LOW (ref 39.0–52.0)
Hemoglobin: 11.9 g/dL — ABNORMAL LOW (ref 13.0–17.0)
Hemoglobin: 11.2 g/dL — ABNORMAL LOW (ref 13.0–17.0)
MCH: 27 pg (ref 26.0–34.0)
MCH: 27.1 pg (ref 26.0–34.0)
MCHC: 31.6 g/dL (ref 30.0–36.0)
MCHC: 32.1 g/dL (ref 30.0–36.0)
MCV: 85.3 fL (ref 80.0–100.0)
MCV: 84.3 fL (ref 80.0–100.0)
Platelets: 394 10*3/uL (ref 150–400)
Platelets: 388 10*3/uL (ref 150–400)
RBC: 4.41 MIL/uL (ref 4.22–5.81)
RBC: 4.14 MIL/uL — ABNORMAL LOW (ref 4.22–5.81)
RDW: 13.2 % (ref 11.5–15.5)
RDW: 13.1 % (ref 11.5–15.5)
WBC: 12.3 10*3/uL — ABNORMAL HIGH (ref 4.0–10.5)
WBC: 12.6 10*3/uL — ABNORMAL HIGH (ref 4.0–10.5)
nRBC: 0 % (ref 0.0–0.2)
nRBC: 0 % (ref 0.0–0.2)

## 2022-06-07 LAB — HEPATIC FUNCTION PANEL
ALT: 12 U/L (ref 0–44)
AST: 16 U/L (ref 15–41)
Albumin: 2.1 g/dL — ABNORMAL LOW (ref 3.5–5.0)
Alkaline Phosphatase: 65 U/L (ref 38–126)
Bilirubin, Direct: <0.1 mg/dL (ref 0.0–0.2)
Total Bilirubin: 0.4 mg/dL (ref 0.3–1.2)
Total Protein: 6.7 g/dL (ref 6.5–8.1)

## 2022-06-07 LAB — BASIC METABOLIC PANEL
Anion gap: 10 (ref 5–15)
BUN: 17 mg/dL (ref 8–23)
CO2: 22 mmol/L (ref 22–32)
Calcium: 8.5 mg/dL — ABNORMAL LOW (ref 8.9–10.3)
Chloride: 102 mmol/L (ref 98–111)
Creatinine, Ser: 1.61 mg/dL — ABNORMAL HIGH (ref 0.61–1.24)
GFR, Estimated: 47 mL/min — ABNORMAL LOW (ref >60)
Glucose, Bld: 116 mg/dL — ABNORMAL HIGH (ref 70–99)
Potassium: 3 mmol/L — ABNORMAL LOW (ref 3.5–5.1)
Sodium: 134 mmol/L — ABNORMAL LOW (ref 135–145)

## 2022-06-07 LAB — RESP PANEL BY RT-PCR (RSV, FLU A&B, COVID)  RVPGX2
Influenza A by PCR: NEGATIVE
Influenza B by PCR: NEGATIVE
Resp Syncytial Virus by PCR: NEGATIVE
SARS Coronavirus 2 by RT PCR: NEGATIVE

## 2022-06-07 LAB — MAGNESIUM: Magnesium: 2 mg/dL (ref 1.7–2.4)

## 2022-06-07 LAB — CREATININE, SERUM
Creatinine, Ser: 1.53 mg/dL — ABNORMAL HIGH (ref 0.61–1.24)
GFR, Estimated: 50 mL/min — ABNORMAL LOW (ref >60)

## 2022-06-07 MED ORDER — CLOPIDOGREL BISULFATE 75 MG PO TABS
75.0000 mg | ORAL_TABLET | Freq: Every day | ORAL | Status: DC
  Administered 2022-06-07 – 2022-06-08 (×2): 75 mg via ORAL
  Filled 2022-06-07 (×2): qty 1

## 2022-06-07 MED ORDER — ACETAMINOPHEN 325 MG PO TABS: 650.0000 mg | ORAL_TABLET | Freq: Four times a day (QID) | ORAL | Status: DC | PRN

## 2022-06-07 MED ORDER — SODIUM CHLORIDE 0.9 % IV SOLN
2.0000 g | Freq: Once | INTRAVENOUS | Status: AC
  Administered 2022-06-07: 2 g via INTRAVENOUS
  Filled 2022-06-07: qty 20

## 2022-06-07 MED ORDER — IBUPROFEN 600 MG PO TABS
800.0000 mg | ORAL_TABLET | Freq: Three times a day (TID) | ORAL | Status: DC | PRN
  Administered 2022-06-07 – 2022-06-08 (×2): 800 mg via ORAL
  Filled 2022-06-07 (×2): qty 1

## 2022-06-07 MED ORDER — LORATADINE 10 MG PO TABS: 10.0000 mg | ORAL_TABLET | Freq: Every day | ORAL | Status: DC | PRN

## 2022-06-07 MED ORDER — AMLODIPINE BESYLATE 10 MG PO TABS
10.0000 mg | ORAL_TABLET | Freq: Every day | ORAL | Status: DC
  Administered 2022-06-07 – 2022-06-08 (×2): 10 mg via ORAL
  Filled 2022-06-07 (×2): qty 1

## 2022-06-07 MED ORDER — SODIUM CHLORIDE 0.9 % IV SOLN
500.0000 mg | Freq: Once | INTRAVENOUS | Status: AC
  Administered 2022-06-07: 500 mg via INTRAVENOUS
  Filled 2022-06-07: qty 5

## 2022-06-07 MED ORDER — MELATONIN 3 MG PO TABS: 3.0000 mg | ORAL_TABLET | Freq: Every evening | ORAL | Status: DC | PRN

## 2022-06-07 MED ORDER — NICOTINE 21 MG/24HR TD PT24: 21.0000 mg | MEDICATED_PATCH | Freq: Every day | TRANSDERMAL | Status: DC

## 2022-06-07 MED ORDER — POTASSIUM CHLORIDE CRYS ER 20 MEQ PO TBCR
40.0000 meq | EXTENDED_RELEASE_TABLET | Freq: Once | ORAL | Status: AC
  Administered 2022-06-07: 40 meq via ORAL
  Filled 2022-06-07: qty 2

## 2022-06-07 MED ORDER — ACETAMINOPHEN 650 MG RE SUPP: 650.0000 mg | Freq: Four times a day (QID) | RECTAL | Status: DC | PRN

## 2022-06-07 MED ORDER — CLOPIDOGREL BISULFATE 75 MG PO TABS: 75.0000 mg | ORAL_TABLET | Freq: Every day | ORAL | Status: DC

## 2022-06-07 MED ORDER — ENOXAPARIN SODIUM 40 MG/0.4ML IJ SOSY
40.0000 mg | PREFILLED_SYRINGE | INTRAMUSCULAR | Status: DC
  Administered 2022-06-07: 40 mg via SUBCUTANEOUS
  Filled 2022-06-07: qty 0.4

## 2022-06-07 MED ORDER — NICOTINE 21 MG/24HR TD PT24
21.0000 mg | MEDICATED_PATCH | Freq: Every day | TRANSDERMAL | Status: DC
  Administered 2022-06-07: 21 mg via TRANSDERMAL
  Filled 2022-06-07: qty 1

## 2022-06-07 MED ORDER — AMLODIPINE BESYLATE 10 MG PO TABS: 10.0000 mg | ORAL_TABLET | Freq: Every day | ORAL | Status: DC

## 2022-06-07 MED ORDER — SODIUM CHLORIDE 0.9 % IV SOLN: INTRAVENOUS | Status: AC

## 2022-06-07 MED ORDER — LACTATED RINGERS IV BOLUS
1000.0000 mL | Freq: Once | INTRAVENOUS | Status: AC
  Administered 2022-06-07: 1000 mL via INTRAVENOUS

## 2022-06-07 NOTE — Assessment & Plan Note (Signed)
Resolved

## 2022-06-07 NOTE — ED Triage Notes (Signed)
Pt BIBGEMS from home with CP x 4 days.  Pt reports feeling generally unwell for a week, with a cough. Pt is alert and oriented x4. VSS.  324 ASA  HR 90 NSR 118/66

## 2022-06-07 NOTE — ED Notes (Signed)
ED TO INPATIENT HANDOFF REPORT  ED Nurse Name and Phone #: [phone]  S Name/Age/Gender Roy Moore 67 y.o. male Room/Bed: 018C/018C  Code Status   Code Status: Prior  Home/SNF/Other Home Patient oriented to: self, place, time, and situation Is this baseline? Yes   Triage Complete: Triage complete  Chief Complaint Pneumonia [J18.9]  Triage Note Pt BIBGEMS from home with CP x 4 days.  Pt reports feeling generally unwell for a week, with a cough. Pt is alert and oriented x4. VSS.  324 ASA  HR 90 NSR 118/66     Allergies Allergies  Allergen Reactions   No Known Allergies     Level of Care/Admitting Diagnosis ED Disposition     ED Disposition  Admit   Condition  --   Comment  Hospital Area: [facility] [[id number]]  Level of Care: Telemetry Cardiac [103]  May place patient in observation at [facility] or [facility] if equivalent level of care is available:: No  Covid Evaluation: Confirmed COVID Negative  Diagnosis: Pneumonia [227785]  Admitting Physician: Ivery Quale  Attending Physician: Pearlean Brownie L [[id number]]          B Medical/Surgery History Past Medical History:  Diagnosis Date   Anxiety    Hyperlipidemia    Peripheral vascular disease (HCC)    Stroke (HCC) 2009   no residual   Past Surgical History:  Procedure Laterality Date   ABDOMINAL AORTOGRAM W/LOWER EXTREMITY [name] 06/09/2016   Procedure: Abdominal Aortogram w/Lower Extremity;  Surgeon: Nada Libman, MD;  Location: [facility] INVASIVE CV LAB;  Service: Cardiovascular;  Laterality: [name];   BACK SURGERY     10 yrs ago or so-fusion in neck    CERVICAL FUSION     COLONOSCOPY     15-20 yrs ago-normal per pt.    COLONOSCOPY W/ POLYPECTOMY     CYSTOSCOPY     kidney stools removed    FEMORAL-POPLITEAL BYPASS GRAFT Right 07/24/2016   Procedure: RIGHT FEMORAL TO ABOVE KNEE POPLITEAL ARTERY BYPASS GRAFT;  Surgeon: Nada Libman, MD;  Location: [facility] OR;   Service: Vascular;  Laterality: Right;   LUMBAR LAMINECTOMY/DECOMPRESSION MICRODISCECTOMY Bilateral 12/03/2015   Procedure: Microdiscectomy - bilateral - Lumbar four-lumbar five;  Surgeon: Julio Sicks, MD;  Location: [facility] OR;  Service: Neurosurgery;  Laterality: Bilateral;   PERIPHERAL VASCULAR INTERVENTION Right 06/09/2016   Procedure: Peripheral Vascular Intervention;  Surgeon: Nada Libman, MD;  Location: [facility];  Service: Cardiovascular;  Laterality: Right;  SFA   REPAIR EXTENSOR TENDON Left 10/12/2018   Procedure: TENDON SHEATH RELEASE/TENOLYSIS;  Surgeon: Tarry Kos, MD;  Location: [facility];  Service: Orthopedics;  Laterality: Left;   teeth removal       A IV Location/Drains/Wounds Patient Lines/Drains/Airways Status     Active Line/Drains/Airways     Name Placement date Placement time Site Days   Peripheral IV 06/07/22 20 G Left Antecubital 06/07/22  1614  Antecubital  less than 1            Intake/Output Last 24 hours No intake or output data in the 24 hours ending 06/07/22 1844  Labs/Imaging Results for orders placed or performed during the hospital encounter of 06/07/22 (from the past 48 hour(s))  Basic metabolic panel     Status: Abnormal   Collection Time: 06/07/22  4:37 PM  Result Value Ref Range   Sodium 134 (L) 135 - 145 mmol/L   Potassium 3.0 (L) 3.5 -  5.1 mmol/L   Chloride 102 98 - 111 mmol/L   CO2 22 22 - 32 mmol/L   Glucose, Bld 116 (H) 70 - 99 mg/dL    Comment: Glucose reference range applies only to samples taken after fasting for at least 8 hours.   BUN 17 8 - 23 mg/dL   Creatinine, Ser 4.54 (H) 0.61 - 1.24 mg/dL   Calcium 8.5 (L) 8.9 - 10.3 mg/dL   GFR, Estimated 47 (L) >60 mL/min    Comment: (NOTE) Calculated using the CKD-EPI Creatinine Equation (2021)    Anion gap 10 5 - 15    Comment: Performed at [facility], [address], [place], [place] [address]  CBC     Status: Abnormal   Collection Time: 06/07/22   4:37 PM  Result Value Ref Range   WBC 12.3 (H) 4.0 - 10.5 K/uL   RBC 4.41 4.22 - 5.81 MIL/uL   Hemoglobin 11.9 (L) 13.0 - 17.0 g/dL   HCT 91.4 (L) 78.2 - 95.6 %   MCV 85.3 80.0 - 100.0 fL   MCH 27.0 26.0 - 34.0 pg   MCHC 31.6 30.0 - 36.0 g/dL   RDW 21.3 08.6 - 57.8 %   Platelets 394 150 - 400 K/uL   nRBC 0.0 0.0 - 0.2 %    Comment: Performed at [facility], [address], [place], [place] [address]  Troponin I (High Sensitivity)     Status: None   Collection Time: 06/07/22  4:37 PM  Result Value Ref Range   Troponin I (High Sensitivity) 16 <18 ng/L    Comment: (NOTE) Elevated high sensitivity troponin I (hsTnI) values and significant  changes across serial measurements may suggest ACS but many other  chronic and acute conditions are known to elevate hsTnI results.  Refer to the "Links" section for chest pain algorithms and additional  guidance. Performed at [facility], [address], [place], [place] [address]   Hepatic function panel     Status: Abnormal   Collection Time: 06/07/22  4:37 PM  Result Value Ref Range   Total Protein 6.7 6.5 - 8.1 g/dL   Albumin 2.1 (L) 3.5 - 5.0 g/dL   AST 16 15 - 41 U/L   ALT 12 0 - 44 U/L   Alkaline Phosphatase 65 38 - 126 U/L   Total Bilirubin 0.4 0.3 - 1.2 mg/dL   Bilirubin, Direct <1.3 0.0 - 0.2 mg/dL   Indirect Bilirubin NOT CALCULATED 0.3 - 0.9 mg/dL    Comment: Performed at [facility], [address], [place], [place] [address]  Resp panel by RT-PCR (RSV, Flu A&B, Covid) Anterior Nasal Swab     Status: None   Collection Time: 06/07/22  4:54 PM   Specimen: Anterior Nasal Swab  Result Value Ref Range   SARS Coronavirus 2 by RT PCR NEGATIVE NEGATIVE   Influenza A by PCR NEGATIVE NEGATIVE   Influenza B by PCR NEGATIVE NEGATIVE    Comment: (NOTE) The Xpert Xpress SARS-CoV-2/FLU/RSV plus assay is intended as an aid in the diagnosis of influenza from Nasopharyngeal swab specimens and should not be used as a  sole basis for treatment. Nasal washings and aspirates are unacceptable for Xpert Xpress SARS-CoV-2/FLU/RSV testing.  Fact Sheet for Patients: BloggerCourse.com  Fact Sheet for Healthcare Providers: SeriousBroker.it  This test is not yet approved or cleared by the [place] FDA and has been authorized for detection and/or diagnosis of SARS-CoV-2 by FDA  under an Emergency Use Authorization (EUA). This EUA will remain in effect (meaning this test can be used) for the duration of the COVID-19 declaration under Section 564(b)(1) of the Act, 21 U.S.C. section 360bbb-3(b)(1), unless the authorization is terminated or revoked.     Resp Syncytial Virus by PCR NEGATIVE NEGATIVE    Comment: (NOTE) Fact Sheet for Patients: BloggerCourse.com  Fact Sheet for Healthcare Providers: SeriousBroker.it  This test is not yet approved or cleared by the [place] FDA and has been authorized for detection and/or diagnosis of SARS-CoV-2 by FDA under an Emergency Use Authorization (EUA). This EUA will remain in effect (meaning this test can be used) for the duration of the COVID-19 declaration under Section 564(b)(1) of the Act, 21 U.S.C. section 360bbb-3(b)(1), unless the authorization is terminated or revoked.  Performed at [facility], [address], [place], [place] [address]   Troponin I (High Sensitivity)     Status: None   Collection Time: 06/07/22  5:24 PM  Result Value Ref Range   Troponin I (High Sensitivity) 15 <18 ng/L    Comment: (NOTE) Elevated high sensitivity troponin I (hsTnI) values and significant  changes across serial measurements may suggest ACS but many other  chronic and acute conditions are known to elevate hsTnI results.  Refer to the "Links" section for chest pain algorithms and additional  guidance. Performed at [facility], [address]  [place], [place] [address]    DG Chest 2 View  Result Date: 06/07/2022 CLINICAL DATA:  Chest pain for 4 days. EXAM: CHEST - 2 VIEW COMPARISON:  Chest radiograph 10/08/2016 FINDINGS: The heart size and mediastinal contours are within normal limits. Patchy opacities are noted in the lower lungs bilaterally. Small posterior pleural effusion. No pneumothorax. Pectus excavatum. IMPRESSION: Patchy opacities in the lower lungs bilaterally, concerning for multifocal pneumonia. Small posterior pleural effusion. Electronically Signed   By: Sherron Ales M.D.   On: 06/07/2022 16:43    Pending Labs Unresulted Labs (From admission, onward)     Start     Ordered   06/07/22 1727  Magnesium  Add-on,   AD        06/07/22 1726            Vitals/Pain Today's Vitals   06/07/22 1530 06/07/22 1545 06/07/22 1730 06/07/22 1800  BP: (!) 150/71 138/66 137/73 (!) 149/81  Pulse: 86 87 86 88  Resp: (!) 25 (!) 23 (!) 23 (!) 30  SpO2: 98% 98% 98% 97%    Isolation Precautions No active isolations  Medications Medications  cefTRIAXone (ROCEPHIN) 2 g in sodium chloride 0.9 % 100 mL IVPB (2 g Intravenous New Bag/Given 06/07/22 1824)  azithromycin (ZITHROMAX) 500 mg in sodium chloride 0.9 % 250 mL IVPB (has no administration in time range)  lactated ringers bolus 1,000 mL (has no administration in time range)    Mobility walks     Focused Assessments Cardiac Assessment Handoff:    No results found for: "CKTOTAL", "CKMB", "CKMBINDEX", "TROPONINI" No results found for: "DDIMER" Does the Patient currently have chest pain? Yes   , Pulmonary Assessment Handoff:  Lung sounds:  Diminished with rales        R Recommendations: See Admitting Provider Note  Report given to:   Additional Notes:

## 2022-06-07 NOTE — Assessment & Plan Note (Deleted)
S/p DAPT, now only on Plavix 75 mg daily. - Continue Plavix 

## 2022-06-07 NOTE — Progress Notes (Signed)
Family medicine teaching service will be admitting this patient- night team to see patient for full H&P and admission.  Briefly, Roy Moore is a 67 year-old male with a history of PAD, prior CVA in 2009, HLD, tobacco use presenting with chest pain and shortness of breath. Found to have CXR with patchy opacities in bilateral lower lungs concerning for multifocal pneumonia.  Plan to treat with antibiotics for pneumonia.  For chest pain (now resolved) with new ST depression on EKG compared to prior. Reassuringly, troponin 15 > 16. Consider cardiology input/evaluation. Patient is well known to cardiology/vascular surgery given history of PAD and atherosclerosis of RLE w/ claudication s/p femoral-popliteal bypass surgery.   Darral Dash, DO PGY-2, [facility] Family Medicine Service pager [phone]

## 2022-06-07 NOTE — ED Provider Notes (Signed)
[place] EMERGENCY DEPARTMENT AT [facility] Provider Note   CSN: [id number] Arrival date & time: 06/07/22  1529     History  Chief Complaint  Patient presents with   Chest Pain    Roy Moore is a 67 y.o. male.  67 year old male history of peripheral vascular status post right-sided femoral-popliteal bypass graft, prior CVA in 2009, hyperlipidemia, smoking presenting for chest pain and shortness of breath.  Patient states for last 4 days she has had cough, congestion, chills.  Last night and today has had exertional chest pain and shortness of breath.  Chest pain is substernal, nonradiating.  Stabbing initially.  It is since resolved.  No diagnosis of COPD but does have significant smoking history.  No wheezing he is aware of.  With EMS he received aspirin.  He has not had any dizziness or presyncope.  No lower extremity edema.  No history of DVT or PE.  Is never had a heart attack as far he is aware, no stents, heart cath, stress test.  No nausea, vomiting, or GI symptoms   Chest Pain Associated symptoms: shortness of breath        Home Medications Prior to Admission medications   Medication Sig Start Date End Date Taking? Authorizing Provider  amLODipine (NORVASC) 10 MG tablet Take 1 tablet (10 mg total) by mouth daily. 03/26/22  Yes Zenia Resides, MD  clopidogrel (PLAVIX) 75 MG tablet Take 1 tablet (75 mg total) by mouth daily. 06/05/21  Yes Sowell, Apolinar Junes, MD  ibuprofen (ADVIL) 800 MG tablet Take 800 mg by mouth every 8 (eight) hours as needed for headache or moderate pain.   Yes [provider]  loratadine (CLARITIN) 10 MG tablet Take 1 tablet (10 mg total) by mouth daily. Patient taking differently: Take 10 mg by mouth daily as needed for allergies. 06/05/21  Yes Sowell, Apolinar Junes, MD  lovastatin (MEVACOR) 20 MG tablet Take 1 tablet (20 mg total) by mouth at bedtime. Patient not taking: Reported on 06/07/2022 06/05/21   Bess Kinds, MD   amitriptyline (ELAVIL) 50 MG tablet Take 1 tablet (50 mg total) by mouth at bedtime. 11/14/18 04/26/20  Shade Flood, MD  ramipril (ALTACE) 2.5 MG capsule Take 1 capsule (2.5 mg total) by mouth daily. 03/13/19 04/26/20  Shade Flood, MD      Allergies    Elavil [amitriptyline]    Review of Systems   Review of Systems  Respiratory:  Positive for shortness of breath.   Cardiovascular:  Positive for chest pain.    Physical Exam Updated Vital Signs BP 136/63   Pulse 89   Temp 97.8 F (36.6 C) (Oral)   Resp (!) 23   SpO2 98%  Physical Exam Vitals and nursing note reviewed.  Constitutional:      General: He is not in acute distress.    Appearance: He is well-developed.  HENT:     Head: Normocephalic and atraumatic.  Eyes:     Conjunctiva/sclera: Conjunctivae normal.     Pupils: Pupils are equal, round, and reactive to light.  Cardiovascular:     Rate and Rhythm: Normal rate and regular rhythm.     Pulses:          Radial pulses are 2+ on the right side and 2+ on the left side.       Dorsalis pedis pulses are 1+ on the right side and 1+ on the left side.     Heart sounds: No murmur heard.  Pulmonary:     Effort: Pulmonary effort is normal. No respiratory distress.     Breath sounds: Wheezing present.     Comments: Mild diffuse wheezing Abdominal:     Palpations: Abdomen is soft.     Tenderness: There is no abdominal tenderness.  Musculoskeletal:        General: No swelling.     Cervical back: Normal range of motion and neck supple.     Right lower leg: No edema.     Left lower leg: No edema.  Skin:    General: Skin is warm and dry.     Capillary Refill: Capillary refill takes less than 2 seconds.  Neurological:     General: No focal deficit present.     Mental Status: He is alert and oriented to person, place, and time.     Cranial Nerves: No cranial nerve deficit.     Motor: No weakness.  Psychiatric:        Mood and Affect: Mood normal.     ED Results /  Procedures / Treatments   Labs (all labs ordered are listed, but only abnormal results are displayed) Labs Reviewed  BASIC METABOLIC PANEL - Abnormal; Notable for the following components:      Result Value   Sodium 134 (*)    Potassium 3.0 (*)    Glucose, Bld 116 (*)    Creatinine, Ser 1.61 (*)    Calcium 8.5 (*)    GFR, Estimated 47 (*)    All other components within normal limits  CBC - Abnormal; Notable for the following components:   WBC 12.3 (*)    Hemoglobin 11.9 (*)    HCT 37.6 (*)    All other components within normal limits  HEPATIC FUNCTION PANEL - Abnormal; Notable for the following components:   Albumin 2.1 (*)    All other components within normal limits  CBC - Abnormal; Notable for the following components:   WBC 12.6 (*)    RBC 4.14 (*)    Hemoglobin 11.2 (*)    HCT 34.9 (*)    All other components within normal limits  CREATININE, SERUM - Abnormal; Notable for the following components:   Creatinine, Ser 1.53 (*)    GFR, Estimated 50 (*)    All other components within normal limits  RESP PANEL BY RT-PCR (RSV, FLU A&B, COVID)  RVPGX2  MAGNESIUM  HIV ANTIBODY (ROUTINE TESTING W REFLEX)  BASIC METABOLIC PANEL  CBC  TROPONIN I (HIGH SENSITIVITY)  TROPONIN I (HIGH SENSITIVITY)    EKG EKG Interpretation  Date/Time:  Sunday Jun 07 2022 15:31:45 EDT Ventricular Rate:  87 PR Interval:  127 QRS Duration: 84 QT Interval:  351 QTC Calculation: 423 R Axis:   69 Text Interpretation: Sinus rhythm Left atrial enlargement Nonspecific repol abnormality, diffuse leads when compared to prior,  similar appearance with more ST depressions. No STEMI Confirmed by Theda Belfast ([id number]) on 06/07/2022 3:40:50 PM  Radiology DG Chest 2 View  Result Date: 06/07/2022 CLINICAL DATA:  Chest pain for 4 days. EXAM: CHEST - 2 VIEW COMPARISON:  Chest radiograph 10/08/2016 FINDINGS: The heart size and mediastinal contours are within normal limits. Patchy opacities are noted in the  lower lungs bilaterally. Small posterior pleural effusion. No pneumothorax. Pectus excavatum. IMPRESSION: Patchy opacities in the lower lungs bilaterally, concerning for multifocal pneumonia. Small posterior pleural effusion. Electronically Signed   By: Sherron Ales M.D.   On: 06/07/2022 16:43    Procedures Procedures  Medications Ordered in ED Medications  loratadine (CLARITIN) tablet 10 mg (has no administration in time range)  enoxaparin (LOVENOX) injection 40 mg (has no administration in time range)  acetaminophen (TYLENOL) tablet 650 mg (has no administration in time range)    Or  acetaminophen (TYLENOL) suppository 650 mg (has no administration in time range)  ibuprofen (ADVIL) tablet 800 mg (800 mg Oral Given 06/07/22 2234)  clopidogrel (PLAVIX) tablet 75 mg (75 mg Oral Given 06/07/22 2234)  amLODipine (NORVASC) tablet 10 mg (10 mg Oral Given 06/07/22 2234)  melatonin tablet 3 mg (has no administration in time range)  nicotine (NICODERM CQ - dosed in mg/24 hours) patch 21 mg (21 mg Transdermal Patch Applied 06/07/22 2234)  0.9 %  sodium chloride infusion (has no administration in time range)  cefTRIAXone (ROCEPHIN) 2 g in sodium chloride 0.9 % 100 mL IVPB (0 g Intravenous Stopped 06/07/22 1859)  azithromycin (ZITHROMAX) 500 mg in sodium chloride 0.9 % 250 mL IVPB (0 mg Intravenous Stopped 06/07/22 2235)  lactated ringers bolus 1,000 mL (1,000 mLs Intravenous New Bag/Given 06/07/22 1905)  potassium chloride SA (KLOR-CON M) CR tablet 40 mEq (40 mEq Oral Given 06/07/22 2234)    ED Course/ Medical Decision Making/ A&P Clinical Course as of 06/07/22 2332  Sun Jun 07, 2022  1613 Chest x-ray reviewed, he has new right lower lobe hazy and patchy opacity concerning for possible pneumonia. [JD]  1726 Labs reviewed, notable for AKI, leukocytosis, hypokalemia. [JD]    Clinical Course User Index [JD] Fulton Reek, MD                             Medical Decision Making Amount and/or  Complexity of Data Reviewed Labs: ordered. Radiology: ordered.  Risk Decision regarding hospitalization.   67 year old male with history as above presenting for chest pain, shortness of breath in context of recent URI symptoms.  EKG shows sinus rhythm, new inferolateral ST depression compared to prior without significant ST elevation concerning for possible ischemia.  He is chest pain-free at this point and has received aspirin.  Will obtain delta troponin for evaluation of ACS and consider admission for chest pain workup.  He has no pleuritic pain or DVT symptoms, have low concern for PE.  Pain seems less consistent with dissection.  Abdominal exam is benign.  Chest x-ray reviewed, he has bibasilar consolidation concerning for multifocal pneumonia.  Rocephin and azithromycin ordered.  Lab work reviewed, he has mild AKI, leukocytosis, mild hypokalemia.  Troponin is negative although history is concerning for possible cardiac chest pain versus related to his pneumonia.  Given his chest pain and significant pneumonia, I discussed the patient with the family medicine service and he was admitted for further management.        Final Clinical Impression(s) / ED Diagnoses Final diagnoses:  None    Rx / DC Orders ED Discharge Orders     None         Fulton Reek, MD 06/07/22 2332    Tegeler, Canary Brim, MD 06/08/22 1444

## 2022-06-07 NOTE — Assessment & Plan Note (Addendum)
Resolved.  Likely 2/2 pneumonia.  Suspect GERD.  Recommend further outpatient workup including echocardiogram and stress test.  Not compliant with statin, recommend high intensity statin outpatient.

## 2022-06-07 NOTE — Assessment & Plan Note (Addendum)
Smokes half a pack cigrattes daily, with 25 pack-year history. - TOC for tobacco cessation resources - Nicotine patch

## 2022-06-07 NOTE — Assessment & Plan Note (Deleted)
S/p DAPT, now only on Plavix 75 mg daily. - Continue Plavix

## 2022-06-07 NOTE — Assessment & Plan Note (Addendum)
Last lipid panel 2023 with LDL 100, HDL 43. Not adherent to statin. - revisit statin use, could consider switching to high intensity statin - Goal LDL <70

## 2022-06-07 NOTE — Assessment & Plan Note (Signed)
Within goal on home meds. -Continue home amlodipine 10 mg daily

## 2022-06-07 NOTE — H&P (Cosign Needed Addendum)
Hospital Admission History and Physical Service Pager: [phone]  Patient name: Roy Moore Medical record number: [id number] Date of Birth: 05/24/1955 Age: 67 y.o. Gender: male  Primary Care Provider: Bess Kinds, MD Consultants: None Code Status: FULL Preferred Emergency Contact:  Contact Information     Name Relation Home Work Mobile   Swenson,caleb Son   [phone]        Chief Complaint: Chest pain, SOB  Assessment and Plan: Roy Moore is a 67 y.o. male with PMH PAD s/p fem pop, HTN, HLD, tobacco use,  CVA presenting with chest pain in the setting of cough and fever, likely secondary to pneumonia. Differential for this patient's presentation of this includes MI, pneumonia, PE, COPD/emphysema.    Clinical picture most fitting with pneumonia as etiology of his symptoms with CXR showing patchy opacities, tachypnea on exam and leukocytosis. Given underlying co-morbidities (HTN, tobacco use, PAD) he is high-risk for ACS. Reassuringly, troponins negative and flat. EKG changes with ST depression concerning as this is a new change.   * Pneumonia Chest x-ray concerning for multifocal pneumonia, with leukocytosis and tachypnea in setting of shortness of breath.  Viral versus bacterial etiology. Likely has underlying COPD or other lung disease given tobacco use disorder history. S/p ceftriaxone x 1, azithromycin x 1 in ED. -Admit to [facility] TS cardiac telemetry, attending Dr. Deirdre Priest -Redose antibiotics on 5/13, consider transition to PO antibiotics -SpO2 goal greater than 90%  Chest pain Chest pain with ST depression and EKG change from prior. Low suspicion for cardiac etiology with its sporadic nature and alleviation with burping. Has since resolved since admission. Troponins flat x 2. -Repeat EKG in AM -Cardiac telemetry -Consider echocardiogram -A1c -Lipid panel -Consider cardiology consult given extensive history of peripheral arterial disease -Continue ASA  81 mg daily -If pleuritic chest pain, hypoxia or tachycardia can consider D-dimer or CTA PE  AKI (acute kidney injury) (HCC) Cr 1.61 on admission, baseline appears to be around 1.3-1.4. Likely pre-renal in due to decreased oral intake. - s/p 1L LR bolus - NS mIVF x 12 hours - AM BMP  Hypertension BP elevated on admission 150/71.  Now 130s/60s-70s. -Continue home amlodipine 10 mg daily  CVA, old, hemiparesis (HCC) S/p DAPT, now only on Plavix 75 mg daily. - Continue Plavix  TOBACCO ABUSE Smokes half a pack cigrattes daily, with 25 pack-year history. - TOC for tobacco cessation resources - Nicotine patch  Hyperlipidemia Last lipid panel 2023 with LDL 100, HDL 43. Not adherent to statin. - revisit statin use, could consider switching to high intensity statin - Goal LDL <70   Other problems chronic and stable: Osteoarthritis-continue ibuprofen as needed Tobacco abuse-nicotine patch    FEN/GI: Heart healthy VTE Prophylaxis: Lovenox  Disposition: Cardiac-telemetry  History of Present Illness:  Roy Moore is a 67 y.o. male presenting with chest pain  Patient reports he has had some intermittent, moderate, sharp left-sided chest pain for the past 4 days. He reports he has been ill for the past few days with symptoms of cough, fatigue, decreased appetite, fever, chills, chest pain. Reports chest pain improves with burping and drinking ginger ale.  He reports going to the grocery store earlier in which she experienced some dizziness which prompted him to call the ED. Denies SOB.  In the ED, EKG was done which showed sinus rhythm, new inferolateral ST depression compared to prior without significant ST elevation concerning for possible ischemia.  Obtained troponin for evaluation of ACS.  Chest x-ray completed which showed patchy opacities in the lower lungs bilaterally concerning for multifocal pneumonia.  1 dose of azithromycin 500 mg IV and 1 dose of ceftriaxone 2 g given.   Patient's home medication Plavix and amlodipine was restarted.  Review Of Systems: Per HPI with the following additions: None  Pertinent Past Medical History: PAD s/p femoral-popliteal bypass Hypertension Hyperlipidemia GERD Generalized anxiety disorder CVA  Remainder reviewed in history tab.   Pertinent Past Surgical History: Back surgeries  Remainder reviewed in history tab.  Pertinent Social History: Tobacco use: Yes Alcohol use: Denies Other Substance use: occasional THC Lives alone  Pertinent Family History: Mother had MI at age 67  Remainder reviewed in history tab.   Important Outpatient Medications: Amlodipine 10 mg daily Clopidogrel 75 mg daily Remainder reviewed in medication history.   Objective: BP 136/63   Pulse 89   Temp 97.8 F (36.6 C) (Oral)   Resp (!) 23   SpO2 98%  Exam: General: Elderly male, appears older than stated age, NAD Cardiovascular: RRR, no M/R/G Respiratory: Normal effort, on room air, diffuse crackles and wheezing heard bilaterally Gastrointestinal: Nondistended, nontender  Labs:  CBC BMET  Recent Labs  Lab 06/07/22 1637  WBC 12.3*  HGB 11.9*  HCT 37.6*  PLT 394   Recent Labs  Lab 06/07/22 1637  NA 134*  K 3.0*  CL 102  CO2 22  BUN 17  CREATININE 1.61*  GLUCOSE 116*  CALCIUM 8.5*    Troponin x 2 16, 15 WNL    EKG: Sinus rhythm with repol abnormality, more ST depression compared to prior   Imaging Studies Performed:  IMPRESSION: Patchy opacities in the lower lungs bilaterally, concerning for multifocal pneumonia. Small posterior pleural effusion.   Lance Muss, MD 06/07/2022, 9:26 PM PGY-1, [facility]  FPTS Intern pager: [phone], text pages welcome Secure chat group [facility] Teaching Service   Upper Level Addendum:  I have reviewed the above note, making necessary revisions as appropriate.  I agree with the medical decision making and physical exam by the  resident as noted above.  Littie Deeds, MD PGY-3 [facility] Family Medicine Residency

## 2022-06-07 NOTE — Assessment & Plan Note (Addendum)
Chest x-ray concerning for multifocal pneumonia, with leukocytosis and tachypnea in setting of shortness of breath.  Viral versus bacterial etiology. Likely has underlying COPD or other lung disease given tobacco use disorder history. S/p ceftriaxone x 1, azithromycin x 1 in ED. -Admit to FM TS cardiac telemetry, attending Dr. Deirdre Priest -Redose antibiotics on 5/13, consider transition to PO antibiotics -SpO2 goal greater than 90%

## 2022-06-08 ENCOUNTER — Other Ambulatory Visit (HOSPITAL_COMMUNITY): Payer: Self-pay

## 2022-06-08 DIAGNOSIS — J189 Pneumonia, unspecified organism: Secondary | ICD-10-CM

## 2022-06-08 LAB — CBC
HCT: 32.9 % — ABNORMAL LOW (ref 39.0–52.0)
Hemoglobin: 10.7 g/dL — ABNORMAL LOW (ref 13.0–17.0)
MCH: 27.7 pg (ref 26.0–34.0)
MCHC: 32.5 g/dL (ref 30.0–36.0)
MCV: 85.2 fL (ref 80.0–100.0)
Platelets: 385 10*3/uL (ref 150–400)
RBC: 3.86 MIL/uL — ABNORMAL LOW (ref 4.22–5.81)
RDW: 13.2 % (ref 11.5–15.5)
WBC: 12.8 10*3/uL — ABNORMAL HIGH (ref 4.0–10.5)
nRBC: 0 % (ref 0.0–0.2)

## 2022-06-08 LAB — HIV ANTIBODY (ROUTINE TESTING W REFLEX): HIV Screen 4th Generation wRfx: NONREACTIVE

## 2022-06-08 LAB — BASIC METABOLIC PANEL
Anion gap: 13 (ref 5–15)
BUN: 18 mg/dL (ref 8–23)
CO2: 22 mmol/L (ref 22–32)
Calcium: 8.1 mg/dL — ABNORMAL LOW (ref 8.9–10.3)
Chloride: 102 mmol/L (ref 98–111)
Creatinine, Ser: 1.42 mg/dL — ABNORMAL HIGH (ref 0.61–1.24)
GFR, Estimated: 54 mL/min — ABNORMAL LOW (ref >60)
Glucose, Bld: 125 mg/dL — ABNORMAL HIGH (ref 70–99)
Potassium: 2.9 mmol/L — ABNORMAL LOW (ref 3.5–5.1)
Sodium: 137 mmol/L (ref 135–145)

## 2022-06-08 MED ORDER — POTASSIUM CHLORIDE CRYS ER 20 MEQ PO TBCR
40.0000 meq | EXTENDED_RELEASE_TABLET | ORAL | Status: AC
  Administered 2022-06-08 (×2): 40 meq via ORAL
  Filled 2022-06-08 (×2): qty 2

## 2022-06-08 MED ORDER — AZITHROMYCIN 500 MG PO TABS
500.0000 mg | ORAL_TABLET | Freq: Every day | ORAL | 0 refills | Status: AC
  Filled 2022-06-08: qty 2, 2d supply, fill #0

## 2022-06-08 MED ORDER — ATORVASTATIN CALCIUM 20 MG PO TABS
20.0000 mg | ORAL_TABLET | Freq: Every day | ORAL | 0 refills | Status: DC
  Filled 2022-06-08: qty 90, 90d supply, fill #0

## 2022-06-08 MED ORDER — LORATADINE 10 MG PO TABS
10.0000 mg | ORAL_TABLET | Freq: Every day | ORAL | 11 refills | Status: DC
  Filled 2022-06-08: qty 100, 100d supply, fill #0
  Filled 2022-06-08: qty 90, 90d supply, fill #0

## 2022-06-08 MED ORDER — AZITHROMYCIN 250 MG PO TABS: 500.0000 mg | ORAL_TABLET | Freq: Once | ORAL | Status: DC

## 2022-06-08 MED ORDER — AMOXICILLIN-POT CLAVULANATE 875-125 MG PO TABS
1.0000 | ORAL_TABLET | Freq: Two times a day (BID) | ORAL | 0 refills | Status: AC
  Filled 2022-06-08: qty 12, 6d supply, fill #0

## 2022-06-08 MED ORDER — AMOXICILLIN-POT CLAVULANATE 875-125 MG PO TABS: 1.0000 | ORAL_TABLET | Freq: Once | ORAL | Status: DC

## 2022-06-08 NOTE — Discharge Summary (Signed)
Family Medicine Teaching [facility] Discharge Summary  Patient name: Roy Moore Medical record number: [id number] Date of birth: [birth year] Age: 67 y.o. Gender: male Date of Admission: 06/07/2022  Date of Discharge: 06/08/2022 Admitting Physician: Carney Living, MD  Primary Care Provider: Bess Kinds, MD Consultants: None  Indication for Hospitalization: Pneumonia and chest pain work-up  Brief Hospital Course:  Roy Moore is a 67 y.o male with history of PAD s/p fem pop, HTN, HLD, tobacco use, and CVA . Admitted for IV antibiotics and IV fluids for community acquired pneumonia and AKI. Also chest pain work-up. His hospital course is outlined below.  Pneumonia Presented with shortness of breath and found to have leukocytosis with CXR suspicious for multifocal pneumonia.  Patient remained afebrile, and did not require supplemental oxygenation during admission.  Received 1 day of IV antibiotics (ceftriaxone and azithromycin).  Transitioned to oral Augmentin and azithromycin on 5/13.  At time of discharge shortness of breath resolved, patient was ambulating without dyspnea, and remained on room air.  Discharged home with 6 days of Augmentin and 1 day of azithromycin to complete total 7-day antibiotic course with 3 days of azithromycin.  Chest pain Initially presented with chest pain.  EKG initially had suspicion for ST depression, however troponins trended flat.  Chest pain resolved with IV fluids and antibiotics.  Additionally, patient endorses improvement of chest pain with eructation.  Recommend outpatient follow-up to assess for GERD, CAD, COPD.  AKI Creatinine of 1.61 on admission, with baseline approximately 1.3.  Most likely prerenal 2/2 decreased oral intake.  Creatinine returned to baseline with IV hydration.  Additionally, patient had hypokalemia and received potassium supplement prior to discharge.  Recommend follow-up with PCP.  Other chronic conditions  were medically managed with home medications and formulary alternatives as necessary (HTN, HLD, CVA hx, PAD)  Follow-up recommendations Revisit statin use. Patient not compliant with low intensity statin. LDL goal <70. May benefit from switching to high-intensity therapy. Ensure completion of antibiotic course and resolution of chest pain. Consider OP echo v. stress test in setting of intermittent exertional dyspnea. May benefit from PPI treatment for GERD. Recommend repeat CXR in 6-8 weeks to see resolution of pneumonia. Recommend Lung cancer screening if patient meets USPTF guidelines Consider repeat BMP for hypokalemia and elevated creatinine. Consider PFTs outpatient to evaluate for COPD in setting of tobacco use and exertional dyspnea. Discuss tobacco cessation  Discharge Diagnoses/Problem List:  Principal Problem:   Pneumonia Active Problems:   Chest pain   Hyperlipidemia   TOBACCO ABUSE   CVA, old, hemiparesis (HCC)   Hypertension   AKI (acute kidney injury) (HCC)  Disposition: Home  Discharge Condition: Stable  Discharge Exam: Per my progress note on 06/08/2022 General: NAD, resting comfortably, eating breakfast Cardiovascular: RRR, no murmurs Respiratory: Mild crackles and wheezing heard bilaterally.  Normal work of breathing on room air Abdomen: Soft, nontender, nondistended. Extremities: Moving all 4 extremities, no edema  Significant Procedures: None  Significant Labs and Imaging:  Recent Labs  Lab 06/07/22 1637 06/07/22 2149 06/08/22 0052  WBC 12.3* 12.6* 12.8*  HGB 11.9* 11.2* 10.7*  HCT 37.6* 34.9* 32.9*  PLT 394 388 385   Recent Labs  Lab 06/07/22 1637 06/07/22 2149 06/08/22 0052  NA 134*  --  137  K 3.0*  --  2.9*  CL 102  --  102  CO2 22  --  22  GLUCOSE 116*  --  125*  BUN 17  --  18  CREATININE 1.61* 1.53* 1.42*  CALCIUM 8.5*  --  8.1*  MG 2.0  --   --   ALKPHOS 65  --   --   AST 16  --   --   ALT 12  --   --   ALBUMIN 2.1*  --   --     DG Chest 2 View Result Date: 06/07/2022 IMPRESSION: Patchy opacities in the lower lungs bilaterally, concerning for multifocal pneumonia. Small posterior pleural effusion.  Results/Tests Pending at Time of Discharge: none  Discharge Medications:  Allergies as of 06/08/2022       Reactions   Elavil [amitriptyline] Other (See Comments)   Pt tried to use for sleep, medication had opposite effect.        Medication List     TAKE these medications    amLODipine 10 MG tablet Commonly known as: NORVASC Take 1 tablet (10 mg total) by mouth daily.   amoxicillin-clavulanate 875-125 MG tablet Commonly known as: AUGMENTIN Take 1 tablet by mouth 2 (two) times daily for 6 days.   azithromycin 500 MG tablet Commonly known as: Zithromax Take 1 tablet (500 mg total) by mouth daily for 2 days.   clopidogrel 75 MG tablet Commonly known as: Plavix Take 1 tablet (75 mg total) by mouth daily.   ibuprofen 800 MG tablet Commonly known as: ADVIL Take 800 mg by mouth every 8 (eight) hours as needed for headache or moderate pain.   loratadine 10 MG tablet Commonly known as: CLARITIN Take 1 tablet (10 mg total) by mouth daily. What changed:  when to take this reasons to take this   lovastatin 20 MG tablet Commonly known as: MEVACOR Take 1 tablet (20 mg total) by mouth at bedtime.        Discharge Instructions: Please refer to Patient Instructions section of EMR for full details.  Patient was counseled important signs and symptoms that should prompt return to medical care, changes in medications, dietary instructions, activity restrictions, and follow up appointments.   Follow-Up Appointments:  Follow-up Information     [facility]. Go to.   Why: Go to your appointment at [facility] on 06/12/2022.               Tiffany Kocher, DO 06/08/2022, 12:03 PM PGY-1, [facility] Family [facility]

## 2022-06-08 NOTE — Hospital Course (Addendum)
Roy Moore is a 67 y.o male with history of PAD s/p fem pop, HTN, HLD, tobacco use, and CVA . Admitted for IV antibiotics and IV fluids for community acquired pneumonia and AKI. Also chest pain work-up. His hospital course is outlined below.  Pneumonia Presented with shortness of breath and found to have leukocytosis with CXR suspicious for multifocal pneumonia.  Patient remained afebrile, and did not require supplemental oxygenation during admission.  Received 1 day of IV antibiotics (ceftriaxone and azithromycin).  Transitioned to oral Augmentin and azithromycin on 5/13.  At time of discharge shortness of breath resolved, patient was ambulating without dyspnea, and remained on room air.  Discharged home with 6 days of Augmentin and 2 days of azithromycin to complete total 7-day antibiotic course with 3 days of azithromycin.  Chest pain Initially presented with chest pain.  EKG initially had suspicion for ST depression, however troponins trended flat.  Chest pain resolved with IV fluids and antibiotics.  Additionally, patient endorses improvement of chest pain with eructation.  Recommend outpatient follow-up to assess for GERD, CAD, COPD.  AKI Creatinine of 1.61 on admission, with baseline approximately 1.3.  Most likely prerenal 2/2 decreased oral intake.  Creatinine returned to baseline with IV hydration.  Additionally, patient had hypokalemia and received potassium supplement prior to discharge.  Recommend follow-up with PCP.  Other chronic conditions were medically managed with home medications and formulary alternatives as necessary (HTN, HLD, CVA hx, PAD)  Follow-up recommendations Revisit statin use. Patient not compliant with low intensity statin. LDL goal <70. May benefit from switching to high-intensity therapy. Ensure completion of antibiotic course and resolution of chest pain. Consider OP echo v. stress test in setting of intermittent exertional dyspnea. May benefit from PPI  treatment for GERD. Recommend repeat CXR in 6-8 weeks to see resolution of pneumonia. Recommend Lung cancer screening if patient meets USPTF guidelines Consider repeat BMP for hypokalemia and elevated creatinine. Consider PFTs outpatient to evaluate for COPD in setting of tobacco use and exertional dyspnea. Discuss tobacco cessation

## 2022-06-08 NOTE — Discharge Instructions (Addendum)
Dear Roy Moore,  Thank you for letting us participate in your care. You were hospitalized for chest pain and shortness of breath and diagnosed with Pneumonia. You were treated with antibiotics and IV fluids.  POST-HOSPITAL & CARE INSTRUCTIONS Please take 1 tablet of Azithromycin today and tomorrow afternoon. Please take Augmentin for 6 more days. Your last dose will be the evening of Saturday 06/13/2022. We recommend you start taking a high intensity statin. Please discuss with your PCP. Continue taking your home medications Go to your follow up appointments (listed below)   DOCTOR'S APPOINTMENT     Follow-up Information     [facility]. Go to.   Why: Go to your appointment at [facility] on 06/12/2022.                Take care and be well!  Family Medicine Teaching Service Inpatient Team [place]  [facility]  [address] [place], [place] [address] [phone]

## 2022-06-08 NOTE — Progress Notes (Signed)
     Daily Progress Note Intern Pager: [phone]  Patient name: Roy Moore Medical record number: [id number] Date of birth: 1955/01/31 Age: 67 y.o. Gender: male  Primary Care Provider: Bess Kinds, MD Consultants: None Code Status: FULL  Pt Overview and Major Events to Date:  5/12: Admitted  Assessment and Plan: Roy Moore is a 68 y.o. male with PMH PAD s/p fem pop, HTN, HLD, tobacco use,  CVA presenting with chest pain in the setting of cough and fever, likely secondary to pneumonia.   * Pneumonia Afebrile. On room air, normal work of breathing. SOB and CP resolved.  Transition to p.o. antibiotics today.  Anticipate home today.  Chest pain Resolved.  Likely 2/2 pneumonia.  Suspect GERD.  Recommend further outpatient workup including echocardiogram and stress test.  Not compliant with statin, recommend high intensity statin outpatient.  AKI (acute kidney injury) (HCC) Resolved.  Hypertension Within goal on home meds. -Continue home amlodipine 10 mg daily  TOBACCO ABUSE Recommend tobacco cessation discussion outpatient.  Recommend PFTs for COPD evaluation.  Hyperlipidemia Last lipid panel 2023 with LDL 100, HDL 43. Not adherent to statin. - revisit statin use, could consider switching to high intensity statin - Goal LDL <70   FEN/GI: Heart Healthy PPx: Lovenox Dispo:Home  today .  Subjective:  No acute concerns. Feels back to baseline.  Objective: Temp:  [97.8 F (36.6 C)-98.2 F (36.8 C)] 98 F (36.7 C) (05/13 0913) Pulse Rate:  [83-94] 93 (05/13 0913) Resp:  [20-30] 20 (05/13 0913) BP: (112-150)/(61-81) 112/61 (05/13 0913) SpO2:  [93 %-98 %] 93 % (05/13 0913) Weight:  [61 kg-61.6 kg] 61.6 kg (05/13 0410) Physical Exam: General: NAD, resting comfortably, eating breakfast Cardiovascular: RRR, no murmurs Respiratory: Mild crackles and wheezing heard bilaterally.  Normal work of breathing on room air Abdomen: Soft, nontender,  nondistended. Extremities: Moving all 4 extremities, no edema  Laboratory: Most recent CBC Lab Results  Component Value Date   WBC 12.8 (H) 06/08/2022   HGB 10.7 (L) 06/08/2022   HCT 32.9 (L) 06/08/2022   MCV 85.2 06/08/2022   PLT 385 06/08/2022   Most recent BMP    Latest Ref Rng & Units 06/08/2022   12:52 AM  BMP  Glucose 70 - 99 mg/dL 347   BUN 8 - 23 mg/dL 18   Creatinine 4.25 - 1.24 mg/dL 9.56   Sodium 387 - 564 mmol/L 137   Potassium 3.5 - 5.1 mmol/L 2.9   Chloride 98 - 111 mmol/L 102   CO2 22 - 32 mmol/L 22   Calcium 8.9 - 10.3 mg/dL 8.1     Tiffany Kocher, DO 06/08/2022, 9:35 AM  PGY-1, [place] [facility] FPTS Intern pager: [phone], text pages welcome Secure chat group [facility] Teaching Service

## 2022-06-09 ENCOUNTER — Telehealth: Payer: Self-pay

## 2022-06-09 NOTE — Transitions of Care (Post Inpatient/ED Visit) (Unsigned)
   06/09/2022  Name: Roy Moore MRN: [id number] DOB: 1955-08-27  Today's TOC FU Call Status: Today's TOC FU Call Status:: Unsuccessul Call (1st Attempt) Unsuccessful Call (1st Attempt) Date: 06/09/22  Attempted to reach the patient regarding the most recent Inpatient/ED visit.  Follow Up Plan: Additional outreach attempts will be made to reach the patient to complete the Transitions of Care (Post Inpatient/ED visit) call.   Signature Karena Addison, LPN [facility] Nurse Health Advisor Direct Dial [phone]

## 2022-06-10 NOTE — Transitions of Care (Post Inpatient/ED Visit) (Signed)
   06/10/2022  Name: Roy Moore MRN: [id number] DOB: 22-Jul-1955 Patient dismissed Today's TOC FU Call Status: Today's TOC FU Call Status:: Unsuccessul Call (1st Attempt) Unsuccessful Call (1st Attempt) Date: 06/09/22  Attempted to reach the patient regarding the most recent Inpatient/ED visit.  Follow Up Plan: No further outreach attempts will be made at this time. We have been unable to contact the patient.  Signature Karena Addison, LPN [facility] Nurse Health Advisor Direct Dial [phone]

## 2022-07-08 DIAGNOSIS — Z0000 Encounter for general adult medical examination without abnormal findings: Secondary | ICD-10-CM

## 2022-07-09 ENCOUNTER — Other Ambulatory Visit: Payer: Self-pay | Admitting: Nurse Practitioner

## 2022-07-09 DIAGNOSIS — Z0000 Encounter for general adult medical examination without abnormal findings: Secondary | ICD-10-CM

## 2022-08-11 ENCOUNTER — Ambulatory Visit: Payer: Medicare HMO

## 2022-10-20 ENCOUNTER — Ambulatory Visit (INDEPENDENT_AMBULATORY_CARE_PROVIDER_SITE_OTHER): Payer: Medicare HMO | Admitting: *Deleted

## 2022-10-20 ENCOUNTER — Telehealth: Payer: Self-pay | Admitting: Family Medicine

## 2022-10-20 DIAGNOSIS — Z0000 Encounter for general adult medical examination without abnormal findings: Secondary | ICD-10-CM

## 2022-10-20 NOTE — Progress Notes (Signed)
Subjective:   Roy Moore is a 67 y.o. male who presents for Medicare Annual/Subsequent preventive examination.  Visit Complete: Virtual  I connected with  SUPREME CEFALO on 10/20/22 by a audio enabled telemedicine application and verified that I am speaking with the correct person using two identifiers.  Patient Location: Home  Provider Location: Home Office  I discussed the limitations of evaluation and management by telemedicine. The patient expressed understanding and agreed to proceed.    Cardiac Risk Factors include: advanced age (>57men, >39 women);male gender;family history of premature cardiovascular disease;hypertension;smoking/ tobacco exposure     Objective:    Today's Vitals   10/20/22 1329  PainSc: 5    There is no height or weight on file to calculate BMI.     10/20/2022    1:37 PM 06/07/2022    8:00 PM 06/07/2022    5:15 PM 06/05/2021    1:55 PM 02/09/2019   10:35 AM 10/12/2018    7:01 AM 09/18/2016    1:43 PM  Advanced Directives  Does Patient Have a Medical Advance Directive? No No No No Yes No No  Type of Advance Directive     Healthcare Power of Attorney    Would patient like information on creating a medical advance directive? No - Patient declined No - Patient declined No - Patient declined No - Patient declined  No - Patient declined No - Patient declined    Current Medications (verified) Outpatient Encounter Medications as of 10/20/2022  Medication Sig   amLODipine (NORVASC) 10 MG tablet Take 1 tablet (10 mg total) by mouth daily.   atorvastatin (LIPITOR) 20 MG tablet Take 1 tablet (20 mg total) by mouth daily.   clopidogrel (PLAVIX) 75 MG tablet Take 1 tablet (75 mg total) by mouth daily.   ibuprofen (ADVIL) 800 MG tablet Take 800 mg by mouth every 8 (eight) hours as needed for headache or moderate pain.   loratadine (CLARITIN) 10 MG tablet Take 1 tablet (10 mg total) by mouth daily.   [DISCONTINUED] amitriptyline (ELAVIL) 50 MG tablet Take  1 tablet (50 mg total) by mouth at bedtime.   [DISCONTINUED] ramipril (ALTACE) 2.5 MG capsule Take 1 capsule (2.5 mg total) by mouth daily.   No facility-administered encounter medications on file as of 10/20/2022.    Allergies (verified) Elavil [amitriptyline]   History: Past Medical History:  Diagnosis Date   Anxiety    Hyperlipidemia    Peripheral vascular disease (HCC)    Stroke (HCC) 2009   no residual   Past Surgical History:  Procedure Laterality Date   ABDOMINAL AORTOGRAM W/LOWER EXTREMITY [name] 06/09/2016   Procedure: Abdominal Aortogram w/Lower Extremity;  Surgeon: Nada Libman, MD;  Location: [facility];  Service: Cardiovascular;  Laterality: [name];   BACK SURGERY     10 yrs ago or so-fusion in neck    CERVICAL FUSION     COLONOSCOPY     15-20 yrs ago-normal per pt.    COLONOSCOPY W/ POLYPECTOMY     CYSTOSCOPY     kidney stools removed    FEMORAL-POPLITEAL BYPASS GRAFT Right 07/24/2016   Procedure: RIGHT FEMORAL TO ABOVE KNEE POPLITEAL ARTERY BYPASS GRAFT;  Surgeon: Nada Libman, MD;  Location: [facility] OR;  Service: Vascular;  Laterality: Right;   LUMBAR LAMINECTOMY/DECOMPRESSION MICRODISCECTOMY Bilateral 12/03/2015   Procedure: Microdiscectomy - bilateral - Lumbar four-lumbar five;  Surgeon: Julio Sicks, MD;  Location: [facility];  Service: Neurosurgery;  Laterality: Bilateral;   PERIPHERAL VASCULAR  INTERVENTION Right 06/09/2016   Procedure: Peripheral Vascular Intervention;  Surgeon: Nada Libman, MD;  Location: [facility];  Service: Cardiovascular;  Laterality: Right;  SFA   REPAIR EXTENSOR TENDON Left 10/12/2018   Procedure: TENDON SHEATH RELEASE/TENOLYSIS;  Surgeon: Tarry Kos, MD;  Location: [facility];  Service: Orthopedics;  Laterality: Left;   teeth removal     Family History  Problem Relation Age of Onset   Colon cancer Neg Hx    Rectal cancer Neg Hx    Stomach cancer Neg Hx    Esophageal cancer Neg Hx    Social History    Socioeconomic History   Marital status: Married    Spouse name: Not on file   Number of children: Not on file   Years of education: Not on file   Highest education level: Not on file  Occupational History   Not on file  Tobacco Use   Smoking status: Every Day    Current packs/day: 0.50    Average packs/day: 0.5 packs/day for 47.0 years (23.5 ttl pk-yrs)    Types: Cigarettes   Smokeless tobacco: Never   Tobacco comments:    advised to hold for 24 hours prior  Vaping Use   Vaping status: Never Used  Substance and Sexual Activity   Alcohol use: No    Alcohol/week: 0.0 standard drinks of alcohol    Comment: no alcohol in 23 years (12/02/15   Drug use: No   Sexual activity: Not Currently  Other Topics Concern   Not on file  Social History Narrative   Wife is bedridden and he is primary caregiver---no family.   Social Determinants of Health   Financial Resource Strain: Low Risk  (10/20/2022)   Overall Financial Resource Strain (CARDIA)    Difficulty of Paying Living Expenses: Not very hard  Food Insecurity: No Food Insecurity (10/20/2022)   Hunger Vital Sign    Worried About Running Out of Food in the Last Year: Never true    Ran Out of Food in the Last Year: Never true  Transportation Needs: No Transportation Needs (10/20/2022)   PRAPARE - Administrator, Civil Service (Medical): No    Lack of Transportation (Non-Medical): No  Physical Activity: Inactive (10/20/2022)   Exercise Vital Sign    Days of Exercise per Week: 0 days    Minutes of Exercise per Session: 0 min  Stress: Stress Concern Present (10/20/2022)   [employer] of Occupational Health - Occupational Stress Questionnaire    Feeling of Stress : To some extent  Social Connections: Socially Isolated (10/20/2022)   Social Connection and Isolation Panel [NHANES]    Frequency of Communication with Friends and Family: Twice a week    Frequency of Social Gatherings with Friends and Family: Never     Attends Religious Services: Never    Database administrator or Organizations: Yes    Attends Banker Meetings: Never    Marital Status: Widowed    Tobacco Counseling Ready to quit: Not Answered Counseling given: Not Answered Tobacco comments: advised to hold for 24 hours prior   Clinical Intake:  Pre-visit preparation completed: Yes  Pain : 0-10 Pain Score: 5  Pain Type: Chronic pain Pain Location: Leg Pain Orientation: Left, Right Pain Descriptors / Indicators: Aching, Burning, Constant Pain Onset: More than a month ago Pain Frequency: Constant     Diabetes: No  How often do you need to have someone help you when you  read instructions, pamphlets, or other written materials from your doctor or pharmacy?: 1 - Never  Interpreter Needed?: No  Information entered by :: Remi Haggard LPN   Activities of Daily Living    10/20/2022    1:36 PM 06/07/2022    8:00 PM  In your present state of health, do you have any difficulty performing the following activities:  Hearing? 0 0  Vision? 0 0  Difficulty concentrating or making decisions? 0 0  Walking or climbing stairs? 1 0  Dressing or bathing? 0 0  Doing errands, shopping? 0 0  Preparing Food and eating ? N   Using the Toilet? N   In the past six months, have you accidently leaked urine? N   Do you have problems with loss of bowel control? N   Managing your Medications? N   Managing your Finances? N   Housekeeping or managing your Housekeeping? N     Patient Care Team: Bess Kinds, MD as PCP - General (Family Medicine) Julio Sicks, MD as Consulting Physician (Neurosurgery) Nada Libman, MD as Consulting Physician (Vascular Surgery)  Indicate any recent Medical Services you may have received from other than [facility] providers in the past year (date may be approximate).     Assessment:   This is a routine wellness examination for Kendrik.  Hearing/Vision screen Hearing Screening - Comments:: No  trouble hearing Vision Screening - Comments:: Up to date Unsure of name   Goals Addressed             This Visit's Progress    Patient Stated       For pain in legs to get better      Depression Screen    10/20/2022    1:33 PM 05/15/2019    3:48 PM 03/13/2019    3:37 PM 03/06/2019    4:43 PM 03/06/2019    4:42 PM 02/09/2019   10:39 AM 12/05/2018    3:16 PM  PHQ 2/9 Scores  PHQ - 2 Score 2 0 0 2 1 2  0  PHQ- 9 Score 8   12  8      Fall Risk    10/20/2022    1:30 PM 06/05/2021    1:59 PM 06/05/2021    1:55 PM 05/15/2019    3:48 PM 03/13/2019    3:37 PM  Fall Risk   Falls in the past year? 1 0 0 0 0  Number falls in past yr: 1  0 0 0  Injury with Fall? 0  0 0 0  Risk for fall due to : Impaired balance/gait      Follow up Falls evaluation completed;Education provided;Falls prevention discussed   Falls evaluation completed     MEDICARE RISK AT HOME: Medicare Risk at Home Any stairs in or around the home?: No If so, are there any without handrails?: No Home free of loose throw rugs in walkways, pet beds, electrical cords, etc?: Yes Adequate lighting in your home to reduce risk of falls?: Yes Life alert?: No Use of a cane, walker or w/c?: Yes Grab bars in the bathroom?: Yes Shower chair or bench in shower?: No Elevated toilet seat or a handicapped toilet?: No  TIMED UP AND GO:  Was the test performed?  No    Cognitive Function:        10/20/2022    1:38 PM 02/09/2019   10:40 AM 09/18/2016    1:53 PM  6CIT Screen  What Year? 0 points 0 points 0  points  What month? 0 points 0 points 0 points  What time? 0 points 0 points 0 points  Count back from 20 4 points 0 points 0 points  Months in reverse 4 points 4 points 4 points  Repeat phrase 0 points 0 points 6 points  Total Score 8 points 4 points 10 points    Immunizations Immunization History  Administered Date(s) Administered   Tdap 01/21/2014    TDAP status: Up to date  Flu Vaccine status: Due, Education has  been provided regarding the importance of this vaccine. Advised may receive this vaccine at local pharmacy or Health Dept. Aware to provide a copy of the vaccination record if obtained from local pharmacy or Health Dept. Verbalized acceptance and understanding.  Pneumococcal vaccine status: Due, Education has been provided regarding the importance of this vaccine. Advised may receive this vaccine at local pharmacy or Health Dept. Aware to provide a copy of the vaccination record if obtained from local pharmacy or Health Dept. Verbalized acceptance and understanding.  Covid-19 vaccine status: Information provided on how to obtain vaccines.   Qualifies for Shingles Vaccine? Yes   Zostavax completed No   Shingrix Completed?: No.    Education has been provided regarding the importance of this vaccine. Patient has been advised to call insurance company to determine out of pocket expense if they have not yet received this vaccine. Advised may also receive vaccine at local pharmacy or Health Dept. Verbalized acceptance and understanding.  Screening Tests Health Maintenance  Topic Date Due   Lung Cancer Screening  Never done   COVID-19 Vaccine (1 - 2023-24 season) 11/05/2022 (Originally 09/27/2022)   Colonoscopy  04/07/2023 (Originally 01/11/2020)   INFLUENZA VACCINE  04/26/2023 (Originally 08/27/2022)   Zoster Vaccines- Shingrix (1 of 2) 06/02/2023 (Originally 05/22/2006)   Pneumonia Vaccine 7+ Years old (1 of 2 - PCV) 10/20/2023 (Originally [birth year]/22/1962)   Medicare Annual Wellness (AWV)  10/20/2023   DTaP/Tdap/Td (2 - Td or Tdap) 01/22/2024   Hepatitis C Screening  Completed   HPV VACCINES  Aged Out    Health Maintenance  Health Maintenance Due  Topic Date Due   Lung Cancer Screening  Never done    Colonoscopy  patient states he goes to [facility] and they are having Cologuard sent to him  Lung Cancer Screening: (Low Dose CT Chest recommended if Age 75-80 years, 20 pack-year currently  smoking OR have quit w/in 15years.) does qualify.   Lung Cancer Screening Referral: Education Provided patient will think about getting it done and let office know  Additional Screening:  Hepatitis C Screening: does not qualify; Completed 2017  Vision Screening: Recommended annual ophthalmology exams for early detection of glaucoma and other disorders of the eye. Is the patient up to date with their annual eye exam?  Yes  Who is the provider or what is the name of the office in which the patient attends annual eye exams? Unsure of name  If pt is not established with a provider, would they like to be referred to a provider to establish care? No .   Dental Screening: Recommended annual dental exams for proper oral hygiene   Community Resource Referral / Chronic Care Management: CRR required this visit?  No   CCM required this visit?  No     Plan:     I have personally reviewed and noted the following in the patient's chart:   Medical and social history Use of alcohol, tobacco or illicit drugs  Current  medications and supplements including opioid prescriptions. Patient is not currently taking opioid prescriptions. Functional ability and status Nutritional status Physical activity Advanced directives List of other physicians Hospitalizations, surgeries, and ER visits in previous 12 months Vitals Screenings to include cognitive, depression, and falls Referrals and appointments  In addition, I have reviewed and discussed with patient certain preventive protocols, quality metrics, and best practice recommendations. A written personalized care plan for preventive services as well as general preventive health recommendations were provided to patient.     Remi Haggard, LPN   [id number]   After Visit Summary: (MyChart) Due to this being a telephonic visit, the after visit summary with patients personalized plan was offered to patient via MyChart   Nurse Notes:

## 2022-10-20 NOTE — Telephone Encounter (Signed)
[facility] Team,  Please call this patient and schedule with their PCP to follow up on last visit. They recently had a wellness visit a nurse and need a physician visit.  Terisa Starr, MD  Family Medicine Teaching Service

## 2022-10-20 NOTE — Patient Instructions (Signed)
Roy Moore , Thank you for taking time to come for your Medicare Wellness Visit. I appreciate your ongoing commitment to your health goals. Please review the following plan we discussed and let me know if I can assist you in the future.   Screening recommendations/referrals: Colonoscopy: Education provided Recommended yearly ophthalmology/optometry visit for glaucoma screening and checkup Recommended yearly dental visit for hygiene and checkup  Vaccinations: Influenza vaccine: up to date Pneumococcal vaccine: Education provided Tdap vaccine: up to date Shingles vaccine: Education provided    Advanced directives: Education provided    Preventive Care 65 Years and Older, Male Preventive care refers to lifestyle choices and visits with your health care provider that can promote health and wellness. What does preventive care include? A yearly physical exam. This is also called an annual well check. Dental exams once or twice a year. Routine eye exams. Ask your health care provider how often you should have your eyes checked. Personal lifestyle choices, including: Daily care of your teeth and gums. Regular physical activity. Eating a healthy diet. Avoiding tobacco and drug use. Limiting alcohol use. Practicing safe sex. Taking low doses of aspirin every day. Taking vitamin and mineral supplements as recommended by your health care provider. What happens during an annual well check? The services and screenings done by your health care provider during your annual well check will depend on your age, overall health, lifestyle risk factors, and family history of disease. Counseling  Your health care provider may ask you questions about your: Alcohol use. Tobacco use. Drug use. Emotional well-being. Home and relationship well-being. Sexual activity. Eating habits. History of falls. Memory and ability to understand (cognition). Work and work Astronomer. Screening  You may have  the following tests or measurements: Height, weight, and BMI. Blood pressure. Lipid and cholesterol levels. These may be checked every 5 years, or more frequently if you are over 16 years old. Skin check. Lung cancer screening. You may have this screening every year starting at age 35 if you have a 30-pack-year history of smoking and currently smoke or have quit within the past 15 years. Fecal occult blood test (FOBT) of the stool. You may have this test every year starting at age 34. Flexible sigmoidoscopy or colonoscopy. You may have a sigmoidoscopy every 5 years or a colonoscopy every 10 years starting at age 27. Prostate cancer screening. Recommendations will vary depending on your family history and other risks. Hepatitis C blood test. Hepatitis B blood test. Sexually transmitted disease (STD) testing. Diabetes screening. This is done by checking your blood sugar (glucose) after you have not eaten for a while (fasting). You may have this done every 1-3 years. Abdominal aortic aneurysm (AAA) screening. You may need this if you are a current or former smoker. Osteoporosis. You may be screened starting at age 22 if you are at high risk. Talk with your health care provider about your test results, treatment options, and if necessary, the need for more tests. Vaccines  Your health care provider may recommend certain vaccines, such as: Influenza vaccine. This is recommended every year. Tetanus, diphtheria, and acellular pertussis (Tdap, Td) vaccine. You may need a Td booster every 10 years. Zoster vaccine. You may need this after age 17. Pneumococcal 13-valent conjugate (PCV13) vaccine. One dose is recommended after age 2. Pneumococcal polysaccharide (PPSV23) vaccine. One dose is recommended after age 6. Talk to your health care provider about which screenings and vaccines you need and how often you need them. This information is  not intended to replace advice given to you by your health  care provider. Make sure you discuss any questions you have with your health care provider. Document Released: 02/08/2015 Document Revised: 10/02/2015 Document Reviewed: 11/13/2014 Elsevier Interactive Patient Education  2017 [employer].  Fall Prevention in the Home Falls can cause injuries. They can happen to people of all ages. There are many things you can do to make your home safe and to help prevent falls. What can I do on the outside of my home? Regularly fix the edges of walkways and driveways and fix any cracks. Remove anything that might make you trip as you walk through a door, such as a raised step or threshold. Trim any bushes or trees on the path to your home. Use bright outdoor lighting. Clear any walking paths of anything that might make someone trip, such as rocks or tools. Regularly check to see if handrails are loose or broken. Make sure that both sides of any steps have handrails. Any raised decks and porches should have guardrails on the edges. Have any leaves, snow, or ice cleared regularly. Use sand or salt on walking paths during winter. Clean up any spills in your garage right away. This includes oil or grease spills. What can I do in the bathroom? Use night lights. Install grab bars by the toilet and in the tub and shower. Do not use towel bars as grab bars. Use non-skid mats or decals in the tub or shower. If you need to sit down in the shower, use a plastic, non-slip stool. Keep the floor dry. Clean up any water that spills on the floor as soon as it happens. Remove soap buildup in the tub or shower regularly. Attach bath mats securely with double-sided non-slip rug tape. Do not have throw rugs and other things on the floor that can make you trip. What can I do in the bedroom? Use night lights. Make sure that you have a light by your bed that is easy to reach. Do not use any sheets or blankets that are too big for your bed. They should not hang down onto the  floor. Have a firm chair that has side arms. You can use this for support while you get dressed. Do not have throw rugs and other things on the floor that can make you trip. What can I do in the kitchen? Clean up any spills right away. Avoid walking on wet floors. Keep items that you use a lot in easy-to-reach places. If you need to reach something above you, use a strong step stool that has a grab bar. Keep electrical cords out of the way. Do not use floor polish or wax that makes floors slippery. If you must use wax, use non-skid floor wax. Do not have throw rugs and other things on the floor that can make you trip. What can I do with my stairs? Do not leave any items on the stairs. Make sure that there are handrails on both sides of the stairs and use them. Fix handrails that are broken or loose. Make sure that handrails are as long as the stairways. Check any carpeting to make sure that it is firmly attached to the stairs. Fix any carpet that is loose or worn. Avoid having throw rugs at the top or bottom of the stairs. If you do have throw rugs, attach them to the floor with carpet tape. Make sure that you have a light switch at the top of the  stairs and the bottom of the stairs. If you do not have them, ask someone to add them for you. What else can I do to help prevent falls? Wear shoes that: Do not have high heels. Have rubber bottoms. Are comfortable and fit you well. Are closed at the toe. Do not wear sandals. If you use a stepladder: Make sure that it is fully opened. Do not climb a closed stepladder. Make sure that both sides of the stepladder are locked into place. Ask someone to hold it for you, if possible. Clearly mark and make sure that you can see: Any grab bars or handrails. First and last steps. Where the edge of each step is. Use tools that help you move around (mobility aids) if they are needed. These include: Canes. Walkers. Scooters. Crutches. Turn on the  lights when you go into a dark area. Replace any light bulbs as soon as they burn out. Set up your furniture so you have a clear path. Avoid moving your furniture around. If any of your floors are uneven, fix them. If there are any pets around you, be aware of where they are. Review your medicines with your doctor. Some medicines can make you feel dizzy. This can increase your chance of falling. Ask your doctor what other things that you can do to help prevent falls. This information is not intended to replace advice given to you by your health care provider. Make sure you discuss any questions you have with your health care provider. Document Released: 11/08/2008 Document Revised: 06/20/2015 Document Reviewed: 02/16/2014 Elsevier Interactive Patient Education  2017 [employer].

## 2022-12-27 DIAGNOSIS — Z87898 Personal history of other specified conditions: Secondary | ICD-10-CM

## 2022-12-27 HISTORY — DX: Personal history of other specified conditions: Z87.898

## 2022-12-28 ENCOUNTER — Emergency Department (HOSPITAL_COMMUNITY)
Admission: EM | Admit: 2022-12-28 | Discharge: 2022-12-28 | Disposition: A | Discharge status: Home / Self Care | Payer: Medicare HMO | Attending: Emergency Medicine | Admitting: Emergency Medicine

## 2022-12-28 ENCOUNTER — Other Ambulatory Visit: Payer: Self-pay

## 2022-12-28 DIAGNOSIS — N3001 Acute cystitis with hematuria: Secondary | ICD-10-CM | POA: Insufficient documentation

## 2022-12-28 DIAGNOSIS — R339 Retention of urine, unspecified: Secondary | ICD-10-CM | POA: Diagnosis present

## 2022-12-28 LAB — URINALYSIS, ROUTINE W REFLEX MICROSCOPIC
Bacteria, UA: NONE SEEN
Bilirubin Urine: NEGATIVE
Glucose, UA: NEGATIVE mg/dL
Ketones, ur: NEGATIVE mg/dL
Leukocytes,Ua: NEGATIVE
Nitrite: POSITIVE — AB
Protein, ur: NEGATIVE mg/dL
Specific Gravity, Urine: 1.01 (ref 1.005–1.030)
pH: 6 (ref 5.0–8.0)

## 2022-12-28 LAB — CBC
HCT: 40.5 % (ref 39.0–52.0)
Hemoglobin: 12.9 g/dL — ABNORMAL LOW (ref 13.0–17.0)
MCH: 27.6 pg (ref 26.0–34.0)
MCHC: 31.9 g/dL (ref 30.0–36.0)
MCV: 86.7 fL (ref 80.0–100.0)
Platelets: 364 10*3/uL (ref 150–400)
RBC: 4.67 MIL/uL (ref 4.22–5.81)
RDW: 14.3 % (ref 11.5–15.5)
WBC: 17.3 10*3/uL — ABNORMAL HIGH (ref 4.0–10.5)
nRBC: 0 % (ref 0.0–0.2)

## 2022-12-28 LAB — BASIC METABOLIC PANEL
Anion gap: 15 (ref 5–15)
BUN: 23 mg/dL (ref 8–23)
CO2: 18 mmol/L — ABNORMAL LOW (ref 22–32)
Calcium: 8.8 mg/dL — ABNORMAL LOW (ref 8.9–10.3)
Chloride: 104 mmol/L (ref 98–111)
Creatinine, Ser: 1.81 mg/dL — ABNORMAL HIGH (ref 0.61–1.24)
GFR, Estimated: 41 mL/min — ABNORMAL LOW (ref >60)
Glucose, Bld: 96 mg/dL (ref 70–99)
Potassium: 4 mmol/L (ref 3.5–5.1)
Sodium: 137 mmol/L (ref 135–145)

## 2022-12-28 MED ORDER — LIDOCAINE HCL URETHRAL/MUCOSAL 2 % EX GEL: 1.0000 | Freq: Once | CUTANEOUS | Status: DC | PRN

## 2022-12-28 MED ORDER — CEPHALEXIN 250 MG PO CAPS
500.0000 mg | ORAL_CAPSULE | Freq: Once | ORAL | Status: AC
  Administered 2022-12-28: 500 mg via ORAL
  Filled 2022-12-28: qty 2

## 2022-12-28 MED ORDER — CEPHALEXIN 500 MG PO CAPS: 500.0000 mg | ORAL_CAPSULE | Freq: Four times a day (QID) | ORAL | 0 refills | Status: DC

## 2022-12-28 NOTE — ED Triage Notes (Signed)
Pt BIB [facility] for Urinary retention x 3 days. Per EMS Last void was Friday night. PT has  Some leaking when he lays down. Endorses pain to penis and lower abdomen.  Endorses burning sensation.  Pt endorses some hx of renal disease and stones.  Hurts to sit up.    146/86 Hr 108 98%

## 2022-12-28 NOTE — ED Provider Triage Note (Signed)
Emergency Medicine Provider Triage Evaluation Note  Roy Moore , a 67 y.o. male  was evaluated in triage.  Pt complains of urinary retention. Last voided Saturday night. Has some urine leaking when he lays down, but unable to produce forceful stream. Having pain in lower abdomen and penis with associated burning sensation. Hx of renal disease and kidney stones. Has never had retention requiring catheter before.   Review of Systems  Positive: Urinary retention, abd pain, penile pain Negative:   Physical Exam  BP (!) 152/85 (BP Location: Right Arm)   Pulse 99   Temp 98.6 F (37 C) (Oral)   Resp 20   Ht 5\' 9"  (1.753 m)   Wt 65.8 kg   SpO2 99%   BMI 21.41 kg/m  Gen:   Awake, no distress   Resp:  Normal effort  MSK:   Moves extremities without difficulty  Other:  Suprapubic TTP  Medical Decision Making  Medically screening exam initiated at 2:52 PM.  Appropriate orders placed.  Roy Moore was informed that the remainder of the evaluation will be completed by another provider, this initial triage assessment does not replace that evaluation, and the importance of remaining in the ED until their evaluation is complete.  Bladder scan with 922 mL, will order placement of urinary catheter and obtain basic blood work   Lugenia Assefa T, PA-C 12/28/22 1454

## 2022-12-28 NOTE — ED Provider Notes (Addendum)
EMERGENCY DEPARTMENT AT [facility] Provider Note   CSN: [id number] Arrival date & time: 12/28/22  1430     History  Chief Complaint  Patient presents with   Urinary Retention    Roy Moore is a 67 y.o. male.  HPI Patient reports he started to have difficulty passing urine yesterday.  He reports by today he was having a lot of difficulty and getting a lot of pain in his lower abdomen.  Denies he was experiencing back pain fever or chills.  Patient reports having a catheter once maybe 10 years ago or more.  He has not had any persistent problems with urinary retention.  Reports he has a doctor appointment scheduled on Friday.    Home Medications Prior to Admission medications   Medication Sig Start Date End Date Taking? Authorizing Provider  cephALEXin (KEFLEX) 500 MG capsule Take 1 capsule (500 mg total) by mouth 4 (four) times daily. 12/28/22  Yes Arby Barrette, MD  amLODipine (NORVASC) 10 MG tablet Take 1 tablet (10 mg total) by mouth daily. 03/26/22   Zenia Resides, MD  atorvastatin (LIPITOR) 20 MG tablet Take 1 tablet (20 mg total) by mouth daily. 06/08/22   Dameron, Nolberto Hanlon, DO  clopidogrel (PLAVIX) 75 MG tablet Take 1 tablet (75 mg total) by mouth daily. 06/05/21   Bess Kinds, MD  ibuprofen (ADVIL) 800 MG tablet Take 800 mg by mouth every 8 (eight) hours as needed for headache or moderate pain.    [provider]  loratadine (CLARITIN) 10 MG tablet Take 1 tablet (10 mg total) by mouth daily. 06/08/22   Tiffany Kocher, DO  amitriptyline (ELAVIL) 50 MG tablet Take 1 tablet (50 mg total) by mouth at bedtime. 11/14/18 04/26/20  Shade Flood, MD  ramipril (ALTACE) 2.5 MG capsule Take 1 capsule (2.5 mg total) by mouth daily. 03/13/19 04/26/20  Shade Flood, MD      Allergies    Elavil [amitriptyline]    Review of Systems   Review of Systems  Physical Exam Updated Vital Signs BP (!) 152/85 (BP Location: Right Arm)   Pulse 99    Temp 98.6 F (37 C) (Oral)   Resp 20   Ht 5\' 9"  (1.753 m)   Wt 65.8 kg   SpO2 99%   BMI 21.41 kg/m  Physical Exam Constitutional:      Comments: Alert nontoxic no respiratory distress  HENT:     Mouth/Throat:     Pharynx: Oropharynx is clear.  Eyes:     Extraocular Movements: Extraocular movements intact.  Cardiovascular:     Rate and Rhythm: Normal rate and regular rhythm.  Pulmonary:     Effort: Pulmonary effort is normal.     Breath sounds: Normal breath sounds.  Abdominal:     Palpations: Abdomen is soft.     Comments: Patient examined after catheter placement.  Abdomen is nontender and nondistended.  Musculoskeletal:        General: Normal range of motion.     Comments: No peripheral edema.  Calves are soft and pliable.  Skin:    General: Skin is warm and dry.  Neurological:     General: No focal deficit present.     Mental Status: He is oriented to person, place, and time.     Coordination: Coordination normal.     ED Results / Procedures / Treatments   Labs (all labs ordered are listed, but only abnormal results are displayed) Labs Reviewed  URINALYSIS, ROUTINE W REFLEX MICROSCOPIC - Abnormal; Notable for the following components:      Result Value   Color, Urine AMBER (*)    Hgb urine dipstick MODERATE (*)    Nitrite POSITIVE (*)    All other components within normal limits  CBC - Abnormal; Notable for the following components:   WBC 17.3 (*)    Hemoglobin 12.9 (*)    All other components within normal limits  BASIC METABOLIC PANEL - Abnormal; Notable for the following components:   CO2 18 (*)    Creatinine, Ser 1.81 (*)    Calcium 8.8 (*)    GFR, Estimated 41 (*)    All other components within normal limits  URINE CULTURE    EKG None  Radiology No results found.  Procedures Procedures    Medications Ordered in ED Medications  lidocaine (XYLOCAINE) 2 % jelly 1 Application (has no administration in time range)  cephALEXin (KEFLEX) capsule  500 mg (has no administration in time range)    ED Course/ Medical Decision Making/ A&P                                 Medical Decision Making Amount and/or Complexity of Data Reviewed Labs: ordered.  Risk Prescription drug management.   Presents outlined is arriving urinary retention for about 24 hours.  This appears to be of fairly acute onsets.  Patient is nontoxic.  He does not have CVA tenderness.  Once catheter is placed lower abdomen is nontender nondistended.  Patient feels much improved.  Letter scan showed urinary retention.  Urinalysis is positive with nitrite and RBC count 21-50.  Patient is nontoxic.  He has a nonsurgical abdomen.  He does not have flank pain or fever to suggest retained stone or pyelonephritis.  Patient has had dysuria and urinary retention with hematuria.  Will proceed to treat with Keflex and get urine culture.  Catheter will stay in place.  Patient is counseled on necessity for follow-up with urology.  Patient reports having PCP follow-up on Friday.  He can also discuss referral assistance with his PCP.  [facility] contact information included in discharge instructions.   17: 33 patient was very dismayed at being discharged to follow-up with urology.  He asserts multiple times that he is in a "hospital" now and there should be doctors here who can take care of this problem for him now, instead of having to come back for urology appointment later.  I explained the nature of urinary retention and the typical process of being discharged with a catheter in place.  I described the outpatient workup that will take place for urinary retention.  Patient maintained a level of dissatisfaction and irritation that he would have to go through this when he was already at the hospital and should just get brought into the hospital to have it taken care of now.  At this time, patient does not meet admission criteria and currently is appropriate for outpatient  follow-up with his PCP as he said is already scheduled for the end of the week and urology.     Final Clinical Impression(s) / ED Diagnoses Final diagnoses:  Urinary retention  Acute cystitis without hematuria    Rx / DC Orders ED Discharge Orders          Ordered    cephALEXin (KEFLEX) 500 MG capsule  4 times daily  12/28/22 1658              Arby Barrette, MD 12/28/22 1308    Arby Barrette, MD 12/28/22 6578

## 2022-12-28 NOTE — Discharge Instructions (Addendum)
1.  You have a urinary tract infection.  Start taking Keflex as prescribed.  Your first dose was given in the emergency department. 2.  Your urinary tract was blocked slightly by an enlarged prostate.  You must follow-up with a urologist for further evaluation.  Contact information for [facility] is included in your discharge instructions.  Call to schedule an appointment as soon as possible.  See your family doctor as scheduled this week and have them help you with your follow-up arrangements. 3.  The catheter will stay in place until you are seen in follow-up by urology.  Catheter care instructions have been included in your discharge instructions. 4.  Return to the emergency department if you are developing a fever, back pain, your catheter is not draining or other concerning changes.

## 2022-12-31 LAB — URINE CULTURE: Culture: NO GROWTH

## 2023-01-08 ENCOUNTER — Emergency Department (HOSPITAL_COMMUNITY)
Admission: EM | Admit: 2023-01-08 | Discharge: 2023-01-09 | Disposition: A | Discharge status: Home / Self Care | Payer: Medicare HMO | Attending: Emergency Medicine | Admitting: Emergency Medicine

## 2023-01-08 ENCOUNTER — Other Ambulatory Visit (HOSPITAL_COMMUNITY): Payer: Self-pay

## 2023-01-08 ENCOUNTER — Encounter (HOSPITAL_COMMUNITY): Payer: Self-pay

## 2023-01-08 ENCOUNTER — Other Ambulatory Visit: Payer: Self-pay

## 2023-01-08 DIAGNOSIS — Z7901 Long term (current) use of anticoagulants: Secondary | ICD-10-CM | POA: Insufficient documentation

## 2023-01-08 DIAGNOSIS — Z87891 Personal history of nicotine dependence: Secondary | ICD-10-CM | POA: Insufficient documentation

## 2023-01-08 DIAGNOSIS — R339 Retention of urine, unspecified: Secondary | ICD-10-CM | POA: Insufficient documentation

## 2023-01-08 DIAGNOSIS — R102 Pelvic and perineal pain: Secondary | ICD-10-CM | POA: Insufficient documentation

## 2023-01-08 MED ORDER — TAMSULOSIN HCL 0.4 MG PO CAPS
0.4000 mg | ORAL_CAPSULE | Freq: Every day | ORAL | 11 refills | Status: DC
  Filled 2023-01-08: qty 30, 30d supply, fill #0

## 2023-01-08 NOTE — ED Notes (Signed)
Pt had >918ml on bladder scan

## 2023-01-08 NOTE — ED Provider Notes (Signed)
[place] EMERGENCY DEPARTMENT AT [facility] Provider Note   CSN: [id number] Arrival date & time: 01/08/23  2201     History  Chief Complaint  Patient presents with   Urinary Retention    Roy Moore is a 67 y.o. male.  Patient presents to the emergency department via EMS complaining of urinary retention.  Patient was diagnosed with urinary tract infection on December 2 with subsequent Foley placement due to retention.  His Foley was removed earlier today by urology.  Since going home he has been unable to urinate.  Bladder scan in triage shows greater than 900 mL of urine in the bladder.  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 Authorizing Provider  amLODipine (NORVASC) 10 MG tablet Take 1 tablet (10 mg total) by mouth daily. 03/26/22   Zenia Resides, MD  atorvastatin (LIPITOR) 20 MG tablet Take 1 tablet (20 mg total) by mouth daily. 06/08/22   Dameron, Nolberto Hanlon, DO  cephALEXin (KEFLEX) 500 MG capsule Take 1 capsule (500 mg total) by mouth 4 (four) times daily. 12/28/22   Arby Barrette, MD  clopidogrel (PLAVIX) 75 MG tablet Take 1 tablet (75 mg total) by mouth daily. 06/05/21   Bess Kinds, MD  ibuprofen (ADVIL) 800 MG tablet Take 800 mg by mouth every 8 (eight) hours as needed for headache or moderate pain.    [provider]  loratadine (CLARITIN) 10 MG tablet Take 1 tablet (10 mg total) by mouth daily. 06/08/22   Tiffany Kocher, DO  tamsulosin (FLOMAX) 0.4 MG CAPS capsule Take 1 capsule (0.4 mg total) by mouth daily. 01/08/23     amitriptyline (ELAVIL) 50 MG tablet Take 1 tablet (50 mg total) by mouth at bedtime. 11/14/18 04/26/20  Shade Flood, MD  ramipril (ALTACE) 2.5 MG capsule Take 1 capsule (2.5 mg total) by mouth daily. 03/13/19 04/26/20  Shade Flood, MD      Allergies    Elavil [amitriptyline]    Review  of Systems   Review of Systems  Physical Exam Updated Vital Signs BP (!) 157/73   Pulse 82   Temp 98 F (36.7 C)   Resp 16   Ht 5\' 9"  (1.753 m)   Wt 65 kg   SpO2 97%   BMI 21.16 kg/m  Physical Exam Vitals and nursing note reviewed.  HENT:     Head: Normocephalic and atraumatic.  Cardiovascular:     Rate and Rhythm: Normal rate.  Pulmonary:     Effort: Pulmonary effort is normal. No respiratory distress.  Abdominal:     Tenderness: There is abdominal tenderness (Suprapubic).  Musculoskeletal:        General: No signs of injury.     Cervical back: Normal range of motion.  Skin:    General: Skin is dry.  Neurological:     Mental Status: He is alert.  Psychiatric:        Speech: Speech normal.        Behavior: Behavior normal.     ED Results / Procedures / Treatments   Labs (all labs ordered are listed, but only abnormal results are displayed) Labs Reviewed  URINALYSIS, W/ REFLEX TO CULTURE (INFECTION SUSPECTED)    EKG None  Radiology No results found.  Procedures Procedures    Medications Ordered in ED Medications - No data to display  ED Course/ Medical Decision Making/ A&P                                 Medical Decision Making  This patient presents to the ED for concern of urinary retention, this involves an extensive number of treatment options, and is a complaint that carries with it a high risk of complications and morbidity.    Co morbidities that complicate the patient evaluation  Urinary tract infection   Additional history obtained:  Additional history obtained from chart review External records from outside source obtained and reviewed including notes documenting patient discharged on Keflex   Lab Tests:  I Ordered a urinalysis with reflex to culture which is pending at time of discharge  Social Determinants of Health:  Patient is a tobacco user   Test / Admission - Considered:  New Foley catheter placed with return of  urine.  Patient feeling significantly better.  Will send urine for urinalysis and culture.  Patient continuing to take Keflex at this time.  He will be contacted if culture grows organism requiring change in antibiotics.  Patient will follow-up with urology for further evaluation including catheter removal.         Final Clinical Impression(s) / ED Diagnoses Final diagnoses:  Urinary retention    Rx / DC Orders ED Discharge Orders     None         Pamala Duffel 01/08/23 2358    Laurence Spates, MD 01/09/23 2246

## 2023-01-08 NOTE — Discharge Instructions (Signed)
Please follow up with urology for further evaluation of your urinary retention and for catheter removal. Complete the previously prescribed antibiotics. Return to the emergency department if you develop any life threatening symptoms.

## 2023-01-08 NOTE — ED Triage Notes (Addendum)
BIBA from home  Treated for UTI with foley placement on 12/2.  Patient has foley removed today and is having urinary retention.

## 2023-01-09 DIAGNOSIS — R339 Retention of urine, unspecified: Secondary | ICD-10-CM | POA: Diagnosis not present

## 2023-01-09 LAB — URINALYSIS, W/ REFLEX TO CULTURE (INFECTION SUSPECTED)
Bilirubin Urine: NEGATIVE
Glucose, UA: NEGATIVE mg/dL
Ketones, ur: NEGATIVE mg/dL
Nitrite: NEGATIVE
Protein, ur: NEGATIVE mg/dL
Specific Gravity, Urine: 1.004 — ABNORMAL LOW (ref 1.005–1.030)
pH: 6 (ref 5.0–8.0)

## 2023-01-10 LAB — URINE CULTURE: Culture: NO GROWTH

## 2023-02-01 ENCOUNTER — Other Ambulatory Visit (HOSPITAL_COMMUNITY): Payer: Self-pay

## 2023-02-01 MED ORDER — TAMSULOSIN HCL 0.4 MG PO CAPS
0.4000 mg | ORAL_CAPSULE | Freq: Every day | ORAL | 11 refills | Status: AC
  Filled 2023-02-01: qty 30, 30d supply, fill #0

## 2023-02-09 ENCOUNTER — Other Ambulatory Visit: Payer: Self-pay | Admitting: Urology

## 2023-02-10 ENCOUNTER — Other Ambulatory Visit (HOSPITAL_COMMUNITY): Payer: Self-pay | Admitting: Urology

## 2023-02-10 DIAGNOSIS — R972 Elevated prostate specific antigen [PSA]: Secondary | ICD-10-CM

## 2023-02-24 NOTE — H&P (Signed)
1 - Recurrent Urinary Retention - on / off retention since 12/2022. Eval with cysto / prostae BX / UDS pending. CT 2015 with 30gm prostate vol (NOT enlarged).   Today "Rigo" is seen as on-call work in for PVR and r/o catheter malfunction. Last placed 1/7. Reports SP pain and ? non-drainage.     ALLERGIES: No Allergies    MEDICATIONS: Plavix 75 mg tablet 1 tablet PO Daily  Tamsulosin Hcl 0.4 mg capsule 1 capsule PO Daily  Amlodipine Besylate 10 mg tablet 1 tablet PO Daily     GU PSH: Complex Uroflow - 02/01/2023 Cystoscopy - 02/01/2023 Prostate Needle Biopsy - 2018     NON-GU PSH: Back Surgery (Unspecified), x3 Surgical Pathology, Gross And Microscopic Examination For Prostate Needle - 2018 Visit Complexity (formerly GPC1X) - 02/02/2023, 01/21/2023, 01/08/2023     GU PMH: Urinary Retention - 02/02/2023, - 02/01/2023, - 01/21/2023, - 01/08/2023 Bladder Stone, He has small bladder stones and I will remove them at the time of the prostate biopsy. - 02/01/2023 Elevated PSA - 02/01/2023, - 01/08/2023, - 2018, - 2018 (Stable), A 68 year old male with a PSA of 7.3 and an 4.0 needs evaluation, especially with a firm prostate. I will recommend ultrasound and biopsy., - 2018, - 2018 Prostate nodule w/ LUTS, He has a small nodular prostate with an elevated PSA. He needs a prostate [place] and biopsy. I reviewed the risks of bleeding, infection and difficulty voiding. he will need to have clearance to get off of the plavix. If he goes back into retention I will consider a TUIP to maximize his voiding chances. - 02/01/2023 Weak Urinary Stream - 02/01/2023 BPH w/LUTS - 01/21/2023, - 01/08/2023 Nocturia - 01/08/2023 Microscopic hematuria (Stable), urinalysis not performed today0this will need follow-up - 2018 Renal calculus - 2018, - 2018 Hematuria, Unspec - 2018    NON-GU PMH: Depression Hypercholesterolemia Stroke/TIA    FAMILY HISTORY: Family History Unknown    SOCIAL HISTORY: Marital Status:  Widowed Preferred Language: English; Ethnicity: Not Hispanic Or Latino; Race: White Current Smoking Status: Patient smokes. Has smoked since 12/26/1972. Smokes 1 pack per day.   Tobacco Use Assessment Completed: Used Tobacco in last 30 days? Drinks 3 drinks per day. Types of alcohol consumed: Beer.  Does not drink caffeine.    REVIEW OF SYSTEMS:    GU Review Male:   Patient denies frequent urination, hard to postpone urination, burning/ pain with urination, get up at night to urinate, leakage of urine, stream starts and stops, trouble starting your stream, have to strain to urinate , erection problems, and penile pain.  Gastrointestinal (Upper):   Patient denies nausea, vomiting, and indigestion/ heartburn.  Gastrointestinal (Lower):   Patient denies diarrhea and constipation.  Constitutional:   Patient denies fever, night sweats, weight loss, and fatigue.  Skin:   Patient denies skin rash/ lesion and itching.  Eyes:   Patient denies blurred vision and double vision.  Ears/ Nose/ Throat:   Patient denies sore throat and sinus problems.  Hematologic/Lymphatic:   Patient denies swollen glands and easy bruising.  Cardiovascular:   Patient denies leg swelling and chest pains.  Respiratory:   Patient denies cough and shortness of breath.  Endocrine:   Patient denies excessive thirst.  Musculoskeletal:   Patient denies back pain and joint pain.  Neurological:   Patient denies headaches and dizziness.  Psychologic:   Patient denies anxiety and depression.   VITAL SIGNS: None   GU PHYSICAL EXAMINATION:    Testes: No tenderness, no  swelling, no enlargement left testes. No tenderness, no swelling, no enlargement right testes. Normal location left testes. Normal location right testes. No mass, no cyst, no varicocele, no hydrocele left testes. No mass, no cyst, no varicocele, no hydrocele right testes.  Penis: Circumcised, no warts, no cracks. No dorsal Peyronie's plaques, no left corporal  Peyronie's plaques, no right corporal Peyronie's plaques, no scarring, no warts. No balanitis, no meatal stenosis.  Sphincter Tone: Normal sphincter. No rectal tenderness. No rectal mass.    MULTI-SYSTEM PHYSICAL EXAMINATION:    Constitutional: very thin. Pleasant. Increased WOB (stable)  Neck: Neck symmetrical, not swollen. Normal tracheal position.  Respiratory: No labored breathing, no use of accessory muscles.   Cardiovascular: Normal temperature, normal extremity pulses, no swelling, no varicosities.  Lymphatic: No enlargement of neck, axillae, groin.  Skin: No paleness, no jaundice, no cyanosis. No lesion, no ulcer, no rash.  Neurologic / Psychiatric: Oriented to time, oriented to place, oriented to person. No depression, no anxiety, no agitation.  Gastrointestinal: No mass, no tenderness, no rigidity, non obese abdomen.  Eyes: Normal conjunctivae. Normal eyelids.  Ears, Nose, Mouth, and Throat: Left ear no scars, no lesions, no masses. Right ear no scars, no lesions, no masses. Nose no scars, no lesions, no masses. Normal hearing. Normal lips.  Musculoskeletal: Normal gait and station of head and neck.     Complexity of Data:  Records Review:   Previous Hospital Records  Urodynamics Review:   Review Bladder Scan  X-Ray Review: C.T. Abdomen/Pelvis: Reviewed Films. Reviewed Report. Discussed With Patient.     01/21/23 02/17/16 11/28/15  PSA  Total PSA 7.10 ng/mL 4.00 ng/dl 7.3 ng/dl  Free PSA  5.78 ng/dl   % Free PSA  15 %     PROCEDURES:         PVR Ultrasound - 46962  Scanned Volume: 637 cc        Simple Foley Indwelling Cath Change - 51702  The patient's indwelling foley tube was carefully removed. A 18 French Foley catheter was inserted into the bladder using sterile technique. The patient was taught routine catheter care. A leg bag was connected.   ASSESSMENT:      ICD-10 Details  1 GU:   Urinary Retention - R33.8 Chronic, Exacerbation   PLAN:            Document Letter(s):  Created for Patient: Clinical Summary         Notes:   catheter malfuncitoned / likely pulled out slightly in his sleep last PM. Replaced with immediate relief of pain. NO hematuria. Immeidate output.   RTC as planned for UDS tomorrow and then for OR for prostate BX and endoscopic eval.         Next Appointment:      Next Appointment: 02/24/2023 12:00 PM    Appointment Type: Urodynamics    Location: [facility] [phone]    Provider: Urodynamics Urodynamics    Reason for Visit: UDS

## 2023-02-25 ENCOUNTER — Encounter (HOSPITAL_BASED_OUTPATIENT_CLINIC_OR_DEPARTMENT_OTHER): Payer: Self-pay | Admitting: Urology

## 2023-02-25 NOTE — Progress Notes (Signed)
Spoke w/ via phone for pre-op interview--- pt Lab needs dos----  [employer] results------ current EKG in epic/ chart COVID test -----patient states asymptomatic no test needed Arrive at ------- 0930 on 02-26-2023 NPO after MN NO Solid Food.  Clear liquids from MN until--- 0830 Med rec completed Medications to take morning of surgery ----- pt stated take meds daily, would say when , he just takes am or pm where ever.  So had pt verbalized understanding that if he takes his norvasc today do not take am dos but he did not take night before to please take am morning of surgery  Diabetic medication ----- n/a Patient instructed no nail polish to be worn day of surgery Patient instructed to bring photo id and insurance card day of surgery Patient aware to have Driver (ride ) / caregiver    for 24 hours after surgery - sister-n-law, judy Patient Special Instructions ----- reviewed [facility] and visitor guidelines , even though pt stated he did know he was staying overnight ,  stated eight his son or sister-n-law will pick him up next day Pre-Op special Instructions -----   Patient verbalized understanding of instructions that were given at this phone interview. Patient denies chest pain, sob, fever, cough at the interview.   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   Today pt denies any stroke symptoms or any since 2009.  Anticoagulated on plavix:  managed by pcp;   pt stated he stopped  plavix by office instructions on sheet given,  stated last dose 02-23-2023.  Called office left message for OR scheduler , Bjorn Loser for Dr Annabell Howells, to please fax clearance .

## 2023-02-26 ENCOUNTER — Ambulatory Visit (HOSPITAL_BASED_OUTPATIENT_CLINIC_OR_DEPARTMENT_OTHER): Payer: 59 | Admitting: Certified Registered Nurse Anesthetist

## 2023-02-26 ENCOUNTER — Encounter (HOSPITAL_BASED_OUTPATIENT_CLINIC_OR_DEPARTMENT_OTHER)
Admission: RE | Disposition: A | Discharge status: Home / Self Care | Payer: Self-pay | Source: Home / Self Care | Attending: Urology

## 2023-02-26 ENCOUNTER — Ambulatory Visit (HOSPITAL_COMMUNITY)
Admission: RE | Admit: 2023-02-26 | Discharge: 2023-02-26 | Disposition: A | Discharge status: Home / Self Care | Payer: 59 | Source: Ambulatory Visit | Attending: Urology | Admitting: Urology

## 2023-02-26 ENCOUNTER — Observation Stay (HOSPITAL_BASED_OUTPATIENT_CLINIC_OR_DEPARTMENT_OTHER)
Admission: RE | Admit: 2023-02-26 | Discharge: 2023-02-27 | Disposition: A | Discharge status: Home / Self Care | Payer: 59 | Attending: Urology | Admitting: Urology

## 2023-02-26 ENCOUNTER — Encounter (HOSPITAL_BASED_OUTPATIENT_CLINIC_OR_DEPARTMENT_OTHER): Payer: Self-pay | Admitting: Urology

## 2023-02-26 DIAGNOSIS — Z79899 Other long term (current) drug therapy: Secondary | ICD-10-CM | POA: Diagnosis not present

## 2023-02-26 DIAGNOSIS — N401 Enlarged prostate with lower urinary tract symptoms: Secondary | ICD-10-CM | POA: Diagnosis present

## 2023-02-26 DIAGNOSIS — I10 Essential (primary) hypertension: Secondary | ICD-10-CM

## 2023-02-26 DIAGNOSIS — R972 Elevated prostate specific antigen [PSA]: Secondary | ICD-10-CM | POA: Insufficient documentation

## 2023-02-26 DIAGNOSIS — Z7902 Long term (current) use of antithrombotics/antiplatelets: Secondary | ICD-10-CM | POA: Diagnosis not present

## 2023-02-26 DIAGNOSIS — C61 Malignant neoplasm of prostate: Secondary | ICD-10-CM | POA: Diagnosis not present

## 2023-02-26 DIAGNOSIS — R338 Other retention of urine: Secondary | ICD-10-CM

## 2023-02-26 DIAGNOSIS — Z8673 Personal history of transient ischemic attack (TIA), and cerebral infarction without residual deficits: Secondary | ICD-10-CM | POA: Diagnosis not present

## 2023-02-26 DIAGNOSIS — Z87891 Personal history of nicotine dependence: Secondary | ICD-10-CM | POA: Diagnosis not present

## 2023-02-26 DIAGNOSIS — N210 Calculus in bladder: Secondary | ICD-10-CM

## 2023-02-26 DIAGNOSIS — Z01818 Encounter for other preprocedural examination: Principal | ICD-10-CM

## 2023-02-26 DIAGNOSIS — N138 Other obstructive and reflux uropathy: Secondary | ICD-10-CM | POA: Diagnosis present

## 2023-02-26 HISTORY — DX: Chronic kidney disease, stage 3 unspecified: N18.30

## 2023-02-26 HISTORY — PX: CYSTOSCOPY WITH LITHOLAPAXY: SHX1425

## 2023-02-26 HISTORY — DX: Unspecified osteoarthritis, unspecified site: M19.90

## 2023-02-26 HISTORY — DX: Other allergic rhinitis: J30.89

## 2023-02-26 HISTORY — DX: Polyneuropathy, unspecified: G62.9

## 2023-02-26 HISTORY — DX: Peripheral vascular disease, unspecified: I73.9

## 2023-02-26 HISTORY — DX: Personal history of urinary calculi: Z87.442

## 2023-02-26 HISTORY — DX: Simple chronic bronchitis: J41.0

## 2023-02-26 HISTORY — PX: TRANSURETHRAL INCISION OF PROSTATE: SHX2573

## 2023-02-26 HISTORY — DX: Calculus in bladder: N21.0

## 2023-02-26 HISTORY — DX: Benign prostatic hyperplasia with lower urinary tract symptoms: N40.1

## 2023-02-26 HISTORY — DX: Elevated prostate specific antigen (PSA): R97.20

## 2023-02-26 HISTORY — DX: Cerebral infarction due to unspecified occlusion or stenosis of unspecified carotid artery: I63.239

## 2023-02-26 HISTORY — DX: Essential (primary) hypertension: I10

## 2023-02-26 HISTORY — PX: PROSTATE BIOPSY: SHX241

## 2023-02-26 HISTORY — DX: Presence of spectacles and contact lenses: Z97.3

## 2023-02-26 LAB — POCT I-STAT, CHEM 8
BUN: 16 mg/dL (ref 8–23)
Calcium, Ion: 1.24 mmol/L (ref 1.15–1.40)
Chloride: 107 mmol/L (ref 98–111)
Creatinine, Ser: 1.5 mg/dL — ABNORMAL HIGH (ref 0.61–1.24)
Glucose, Bld: 101 mg/dL — ABNORMAL HIGH (ref 70–99)
HCT: 40 % (ref 39.0–52.0)
Hemoglobin: 13.6 g/dL (ref 13.0–17.0)
Potassium: 3.8 mmol/L (ref 3.5–5.1)
Sodium: 141 mmol/L (ref 135–145)
TCO2: 22 mmol/L (ref 22–32)

## 2023-02-26 SURGERY — BIOPSY, PROSTATE, RECTAL APPROACH, WITH US GUIDANCE
Anesthesia: General | Site: Prostate

## 2023-02-26 MED ORDER — CEFTRIAXONE SODIUM 2 G IJ SOLR
INTRAMUSCULAR | Status: AC
  Filled 2023-02-26: qty 20

## 2023-02-26 MED ORDER — ONDANSETRON HCL 4 MG/2ML IJ SOLN: 4.0000 mg | INTRAMUSCULAR | Status: DC | PRN

## 2023-02-26 MED ORDER — SODIUM CHLORIDE 0.9 % IV SOLN: 1.0000 g | INTRAVENOUS | Status: DC

## 2023-02-26 MED ORDER — ACETAMINOPHEN 500 MG PO TABS
ORAL_TABLET | ORAL | Status: AC
  Filled 2023-02-26: qty 2

## 2023-02-26 MED ORDER — DEXAMETHASONE SODIUM PHOSPHATE 10 MG/ML IJ SOLN
INTRAMUSCULAR | Status: DC | PRN
  Administered 2023-02-26: 10 mg via INTRAVENOUS

## 2023-02-26 MED ORDER — EPHEDRINE SULFATE-NACL 50-0.9 MG/10ML-% IV SOSY
PREFILLED_SYRINGE | INTRAVENOUS | Status: DC | PRN
  Administered 2023-02-26 (×4): 5 mg via INTRAVENOUS

## 2023-02-26 MED ORDER — ALBUMIN HUMAN 5 % IV SOLN: INTRAVENOUS | Status: DC | PRN

## 2023-02-26 MED ORDER — SODIUM CHLORIDE 0.9 % IV SOLN
INTRAVENOUS | Status: DC
Start: 2023-02-26 — End: 2023-02-27

## 2023-02-26 MED ORDER — DEXAMETHASONE SODIUM PHOSPHATE 10 MG/ML IJ SOLN
INTRAMUSCULAR | Status: AC
  Filled 2023-02-26: qty 2

## 2023-02-26 MED ORDER — LIDOCAINE HCL (PF) 2 % IJ SOLN
INTRAMUSCULAR | Status: AC
  Filled 2023-02-26: qty 10

## 2023-02-26 MED ORDER — NICOTINE 21 MG/24HR TD PT24
21.0000 mg | MEDICATED_PATCH | Freq: Once | TRANSDERMAL | Status: DC
  Administered 2023-02-26: 21 mg via TRANSDERMAL
  Filled 2023-02-26: qty 1

## 2023-02-26 MED ORDER — FENTANYL CITRATE (PF) 250 MCG/5ML IJ SOLN
INTRAMUSCULAR | Status: DC | PRN
  Administered 2023-02-26 (×3): 25 ug via INTRAVENOUS
  Administered 2023-02-26: 50 ug via INTRAVENOUS
  Administered 2023-02-26: 25 ug via INTRAVENOUS

## 2023-02-26 MED ORDER — ONDANSETRON HCL 4 MG/2ML IJ SOLN
INTRAMUSCULAR | Status: AC
  Filled 2023-02-26: qty 4

## 2023-02-26 MED ORDER — PHENYLEPHRINE 80 MCG/ML (10ML) SYRINGE FOR IV PUSH (FOR BLOOD PRESSURE SUPPORT)
PREFILLED_SYRINGE | INTRAVENOUS | Status: AC
  Filled 2023-02-26: qty 10

## 2023-02-26 MED ORDER — FENTANYL CITRATE (PF) 100 MCG/2ML IJ SOLN
INTRAMUSCULAR | Status: AC
  Filled 2023-02-26: qty 2

## 2023-02-26 MED ORDER — STERILE WATER FOR IRRIGATION IR SOLN
Status: DC | PRN
  Administered 2023-02-26: 500 mL

## 2023-02-26 MED ORDER — ARTIFICIAL TEARS OPHTHALMIC OINT
TOPICAL_OINTMENT | OPHTHALMIC | Status: AC
  Filled 2023-02-26: qty 3.5

## 2023-02-26 MED ORDER — OXYCODONE HCL 5 MG PO TABS: 5.0000 mg | ORAL_TABLET | Freq: Once | ORAL | Status: DC | PRN

## 2023-02-26 MED ORDER — PHENYLEPHRINE 80 MCG/ML (10ML) SYRINGE FOR IV PUSH (FOR BLOOD PRESSURE SUPPORT)
PREFILLED_SYRINGE | INTRAVENOUS | Status: DC | PRN
  Administered 2023-02-26: 160 ug via INTRAVENOUS
  Administered 2023-02-26 (×4): 80 ug via INTRAVENOUS

## 2023-02-26 MED ORDER — PROPOFOL 10 MG/ML IV BOLUS
INTRAVENOUS | Status: DC | PRN
  Administered 2023-02-26: 80 mg via INTRAVENOUS
  Administered 2023-02-26 (×2): 20 mg via INTRAVENOUS
  Administered 2023-02-26: 120 mg via INTRAVENOUS
  Administered 2023-02-26: 50 mg via INTRAVENOUS
  Administered 2023-02-26: 20 mg via INTRAVENOUS

## 2023-02-26 MED ORDER — ACETAMINOPHEN 325 MG PO TABS: 650.0000 mg | ORAL_TABLET | ORAL | Status: DC | PRN

## 2023-02-26 MED ORDER — BISACODYL 10 MG RE SUPP: 10.0000 mg | Freq: Every day | RECTAL | Status: DC | PRN

## 2023-02-26 MED ORDER — ONDANSETRON HCL 4 MG/2ML IJ SOLN
INTRAMUSCULAR | Status: DC | PRN
  Administered 2023-02-26: 4 mg via INTRAVENOUS

## 2023-02-26 MED ORDER — SODIUM CHLORIDE 0.9 % IR SOLN
Status: DC | PRN
  Administered 2023-02-26 (×2): 3000 mL

## 2023-02-26 MED ORDER — SODIUM CHLORIDE 0.9 % IV SOLN
INTRAVENOUS | Status: AC
  Filled 2023-02-26: qty 100

## 2023-02-26 MED ORDER — MIDAZOLAM HCL 2 MG/2ML IJ SOLN
INTRAMUSCULAR | Status: DC | PRN
  Administered 2023-02-26 (×2): 1 mg via INTRAVENOUS

## 2023-02-26 MED ORDER — DEXTROSE 5 % IV SOLN
INTRAVENOUS | Status: DC | PRN
  Administered 2023-02-26: 2 g via INTRAVENOUS

## 2023-02-26 MED ORDER — PROPOFOL 10 MG/ML IV BOLUS
INTRAVENOUS | Status: AC
  Filled 2023-02-26: qty 20

## 2023-02-26 MED ORDER — OXYCODONE HCL 5 MG PO TABS: 5.0000 mg | ORAL_TABLET | ORAL | Status: DC | PRN

## 2023-02-26 MED ORDER — MIDAZOLAM HCL 2 MG/2ML IJ SOLN
INTRAMUSCULAR | Status: AC
  Filled 2023-02-26: qty 2

## 2023-02-26 MED ORDER — DROPERIDOL 2.5 MG/ML IJ SOLN: 0.6250 mg | Freq: Once | INTRAMUSCULAR | Status: DC | PRN

## 2023-02-26 MED ORDER — SODIUM CHLORIDE 0.9% FLUSH
3.0000 mL | INTRAVENOUS | Status: DC | PRN
Start: 2023-02-26 — End: 2023-02-27

## 2023-02-26 MED ORDER — GENTAMICIN SULFATE 40 MG/ML IJ SOLN
5.0000 mg/kg | INTRAVENOUS | Status: AC
  Administered 2023-02-26: 300 mg via INTRAVENOUS
  Filled 2023-02-26: qty 7.5

## 2023-02-26 MED ORDER — FLEET ENEMA RE ENEM
1.0000 | ENEMA | Freq: Once | RECTAL | Status: DC | PRN
  Filled 2023-02-26: qty 1

## 2023-02-26 MED ORDER — OXYCODONE HCL 5 MG/5ML PO SOLN: 5.0000 mg | Freq: Once | ORAL | Status: DC | PRN

## 2023-02-26 MED ORDER — HYDROMORPHONE HCL 1 MG/ML IJ SOLN
0.5000 mg | INTRAMUSCULAR | Status: DC | PRN
Start: 2023-02-26 — End: 2023-02-27

## 2023-02-26 MED ORDER — FENTANYL CITRATE (PF) 100 MCG/2ML IJ SOLN
25.0000 ug | INTRAMUSCULAR | Status: DC | PRN
  Administered 2023-02-26 (×3): 50 ug via INTRAVENOUS

## 2023-02-26 MED ORDER — LIDOCAINE 2% (20 MG/ML) 5 ML SYRINGE
INTRAMUSCULAR | Status: DC | PRN
  Administered 2023-02-26: 60 mg via INTRAVENOUS

## 2023-02-26 MED ORDER — SODIUM CHLORIDE 0.9% FLUSH: 3.0000 mL | Freq: Two times a day (BID) | INTRAVENOUS | Status: DC

## 2023-02-26 MED ORDER — SENNOSIDES-DOCUSATE SODIUM 8.6-50 MG PO TABS
1.0000 | ORAL_TABLET | Freq: Every evening | ORAL | Status: DC | PRN
  Filled 2023-02-26: qty 1

## 2023-02-26 MED ORDER — ACETAMINOPHEN 500 MG PO TABS
1000.0000 mg | ORAL_TABLET | Freq: Once | ORAL | Status: AC
  Administered 2023-02-26: 1000 mg via ORAL

## 2023-02-26 SURGICAL SUPPLY — 36 items
BAG DRAIN URO-CYSTO SKYTR STRL (DRAIN) ×3 IMPLANT
BAG URINE DRAIN 2000ML AR STRL (UROLOGICAL SUPPLIES) IMPLANT
BAG URINE LEG 500ML (DRAIN) IMPLANT
CATH FOLEY 2WAY SLVR 5CC 20FR (CATHETERS) IMPLANT
CATH FOLEY 2WAY SLVR 5CC 22FR (CATHETERS) IMPLANT
CLOTH BEACON ORANGE TIMEOUT ST (SAFETY) ×3 IMPLANT
ELECT REM PT RETURN 9FT ADLT (ELECTROSURGICAL) ×2
ELECTRODE REM PT RTRN 9FT ADLT (ELECTROSURGICAL) ×3 IMPLANT
EVACUATOR MICROVAS BLADDER (UROLOGICAL SUPPLIES) IMPLANT
GLOVE SURG SS PI 8.0 STRL IVOR (GLOVE) ×3 IMPLANT
GOWN STRL REUS W/TWL LRG LVL3 (GOWN DISPOSABLE) ×3 IMPLANT
GOWN STRL REUS W/TWL XL LVL3 (GOWN DISPOSABLE) ×3 IMPLANT
HOLDER FOLEY CATH W/STRAP (MISCELLANEOUS) IMPLANT
INST BIOPSY MAXCORE 18GX25 (NEEDLE) IMPLANT
INSTR BIOPSY MAXCORE 18GX20 (NEEDLE) IMPLANT
IV NS IRRIG 3000ML ARTHROMATIC (IV SOLUTION) ×3 IMPLANT
KIT TURNOVER CYSTO (KITS) ×3 IMPLANT
LASER FIB FLEXIVA PULSE ID 365 (Laser) IMPLANT
LASER FIB FLEXIVA PULSE ID 550 (Laser) IMPLANT
LASER FIB FLEXIVA PULSE ID 910 (Laser) IMPLANT
MANIFOLD NEPTUNE II (INSTRUMENTS) ×3 IMPLANT
NDL SAFETY ECLIPSE 18X1.5 (NEEDLE) IMPLANT
NDL SPNL 22GX7 QUINCKE BK (NEEDLE) IMPLANT
NEEDLE SPNL 22GX7 QUINCKE BK (NEEDLE) ×2
PACK CYSTO (CUSTOM PROCEDURE TRAY) ×3 IMPLANT
PLUG CATH AND CAP STRL 200 (CATHETERS) IMPLANT
SLEEVE SCD COMPRESS KNEE MED (STOCKING) ×3 IMPLANT
SPONGE HEMORRHOID 8X3CM (HEMOSTASIS) IMPLANT
SURGILUBE 2OZ TUBE FLIPTOP (MISCELLANEOUS) ×3 IMPLANT
SYR CONTROL 10ML LL (SYRINGE) IMPLANT
TOWEL OR 17X24 6PK STRL BLUE (TOWEL DISPOSABLE) ×3 IMPLANT
TRACTIP FLEXIVA PULS ID 200XHI (Laser) IMPLANT
TUBE CONNECTING 12X1/4 (SUCTIONS) IMPLANT
UNDERPAD 30X36 HEAVY ABSORB (UNDERPADS AND DIAPERS) ×3 IMPLANT
WATER STERILE IRR 3000ML UROMA (IV SOLUTION) ×3 IMPLANT
WATER STERILE IRR 500ML POUR (IV SOLUTION) IMPLANT

## 2023-02-26 NOTE — Anesthesia Procedure Notes (Signed)
Procedure Name: LMA Insertion Date/Time: 02/26/2023 11:45 AM  Performed by: Dairl Ponder, CRNAPre-anesthesia Checklist: Patient identified, Emergency Drugs available, Suction available and Patient being monitored Patient Re-evaluated:Patient Re-evaluated prior to induction Oxygen Delivery Method: Circle System Utilized Preoxygenation: Pre-oxygenation with 100% oxygen Induction Type: IV induction Ventilation: Mask ventilation without difficulty LMA: LMA inserted LMA Size: 4.0 Number of attempts: 1 Airway Equipment and Method: Bite block Placement Confirmation: positive ETCO2 Tube secured with: Tape Dental Injury: Teeth and Oropharynx as per pre-operative assessment

## 2023-02-26 NOTE — Anesthesia Preprocedure Evaluation (Signed)
Anesthesia Evaluation  Patient identified by MRN, date of birth, ID band Patient awake    Reviewed: Allergy & Precautions, NPO status , Patient's Chart, lab work & pertinent test results  History of Anesthesia Complications Negative for: history of anesthetic complications  Airway Mallampati: II  TM Distance: >3 FB Neck ROM: Full    Dental  (+) Edentulous Lower, Edentulous Upper   Pulmonary Current Smoker   Pulmonary exam normal        Cardiovascular hypertension, Pt. on medications + Peripheral Vascular Disease  Normal cardiovascular exam     Neuro/Psych   Anxiety     CVA (left arm weak), Residual Symptoms    GI/Hepatic Neg liver ROS,GERD  ,,  Endo/Other  negative endocrine ROS    Renal/GU Renal InsufficiencyRenal disease     Musculoskeletal  (+) Arthritis ,    Abdominal   Peds  Hematology negative hematology ROS (+)   Anesthesia Other Findings ELEVATED PSA, BLADDER STONE AND BENIGN PROSTATIC HYPERPLASIA  Reproductive/Obstetrics                             Anesthesia Physical Anesthesia Plan  ASA: 3  Anesthesia Plan: General   Post-op Pain Management: Tylenol PO (pre-op)*   Induction: Intravenous  PONV Risk Score and Plan: 1 and Ondansetron, Dexamethasone, Treatment may vary due to age or medical condition and Midazolam  Airway Management Planned: LMA  Additional Equipment: None  Intra-op Plan:   Post-operative Plan: Extubation in OR  Informed Consent: I have reviewed the patients History and Physical, chart, labs and discussed the procedure including the risks, benefits and alternatives for the proposed anesthesia with the patient or authorized representative who has indicated his/her understanding and acceptance.     Dental advisory given  Plan Discussed with: CRNA  Anesthesia Plan Comments:        Anesthesia Quick Evaluation

## 2023-02-26 NOTE — Anesthesia Postprocedure Evaluation (Signed)
Anesthesia Post Note  Patient: Roy Moore  Procedure(s) Performed: BIOPSY TRANSRECTAL ULTRASONIC PROSTATE (TUBP) (Prostate) CYSTOSCOPY WITH REMOVAL OF BLADDER STONES (Bladder) TRANSURETHRAL INCISION OF THE PROSTATE (TUIP) (Prostate)     Patient location during evaluation: PACU Anesthesia Type: General Level of consciousness: awake and alert Pain management: pain level controlled Vital Signs Assessment: post-procedure vital signs reviewed and stable Respiratory status: spontaneous breathing, nonlabored ventilation and respiratory function stable Cardiovascular status: blood pressure returned to baseline Postop Assessment: no apparent nausea or vomiting Anesthetic complications: no   No notable events documented.  Last Vitals:  Vitals:   02/26/23 1430 02/26/23 1500  BP: (!) 109/55 (!) 107/54  Pulse: 67 65  Resp: 14 12  Temp:  36.6 C  SpO2: 92% 93%    Last Pain:  Vitals:   02/26/23 1500  TempSrc:   PainSc: 0-No pain                 Shanda Howells

## 2023-02-26 NOTE — Transfer of Care (Signed)
Immediate Anesthesia Transfer of Care Note  Patient: Roy Moore  Procedure(s) Performed: BIOPSY TRANSRECTAL ULTRASONIC PROSTATE (TUBP) (Prostate) CYSTOSCOPY WITH REMOVAL OF BLADDER STONES (Bladder) TRANSURETHRAL INCISION OF THE PROSTATE (TUIP) (Prostate)  Patient Location: PACU  Anesthesia Type:General  Level of Consciousness: drowsy and patient cooperative  Airway & Oxygen Therapy: Patient Spontanous Breathing  Post-op Assessment: Report given to RN and Post -op Vital signs reviewed and stable  Post vital signs: Reviewed and stable  Last Vitals:  Vitals Value Taken Time  BP 121/55 02/26/23 1245  Temp    Pulse 87 02/26/23 1249  Resp 23 02/26/23 1249  SpO2 93 % 02/26/23 1249  Vitals shown include unfiled device data.  Last Pain:  Vitals:   02/26/23 1237  TempSrc:   PainSc: Asleep      Patients Stated Pain Goal: 5 (02/26/23 1013)  Complications: No notable events documented.

## 2023-02-26 NOTE — Op Note (Signed)
Procedure: 1.  Transrectal ultrasound of the prostate. 2.  Ultrasound-guided transrectal prostate biopsy. 3.  Cystoscopy with removal of bladder stones. 4.  Transurethral incision of the prostate.  Preop diagnosis: Elevated PSA with urinary retention.  Postop diagnosis: Same with bladder stones.  Surgeon: Dr. [name].  Anesthesia: General.  Specimen: Prostate biopsies as described below and bladder stones.  Drain: 20 [place] Foley catheter.  EBL: None.  Complications: None.  Indications: The patient is a 68 year old male with urinary retention and an elevated PSA.  On cystoscopy he was noted to have a tight bladder neck but without significant lateral lobe enlargement.  He was felt to require prostate ultrasound-guided biopsy and cystoscopy with possible TUIP.  Procedure: He was taken operating room where he was given gentamicin per pharmacy and 2 g of Rocephin.  A general anesthetic was induced.  He was placed in lithotomy position and fitted with PAS hose.  The 10 MHz transrectal ultrasound probe was assembled and inserted and scanning was performed.  He was noted to have normal-appearing seminal vesicles.  The prostate was small at 26 mL.  There was hypoechoic nodularity of the right base and mid prostate laterally and some calcifications in the transitional zone on the right.  After completion of the diagnostic scan prostate biopsies were obtained from the right base lateral prostate, right base medial, right mid lateral, right mid medial and right apex lateral.  Additional biopsies were obtained from the left base medial and lateral, left mid medial and lateral and left apex lateral prostate.  The ultrasound probe was removed he was noted to have some slight bleeding from the anus so Gelfoam bolster was placed.  He was then prepped with Betadine solution and draped in usual sterile fashion.  The 66 French continuous-flow cystoscope was passed using the visual obturator and the 30  degree lens.  The visual obturator was then replaced with the [facility] handle with a bipolar Collins knife and 30 degree lens.  Saline was used the irrigant.  On inspection he was noted to have a short prostate with a relatively open bladder neck under anesthesia.  Examination of bladder revealed severe trabeculation with several cellules and mucosal erythema from the Foley catheter.  He was noted to have several small bladder stones up to approximately 6 mm which were aspirated from the bladder.  Once all the bladder stones have been removed, transurethral incision of the prostate was performed bilaterally at 5-7 o'clock from just inferior to the ureteral orifices to the lateral to the verumontanum down into fat at the bladder neck.  Hemostasis was achieved with the needle electrode.  Once adequate hemostasis was achieved the scope was removed and pressure on the bladder produced an excellent stream.  A 20 French Foley catheter was inserted.  The balloon was filled with 20 mL of sterile fluid and the catheter was hand irrigated with clear return.  The catheter was placed to straight drainage.  He was taken down for lithotomy position, his anesthetic was reversed and he was moved to recovery in stable condition.  There were no complications.

## 2023-02-26 NOTE — Discharge Instructions (Addendum)
You may resume the plavix in 72 hours if you are not bleeding.

## 2023-02-26 NOTE — Interval H&P Note (Signed)
History and Physical Interval Note: He will stay the night.   02/26/2023 11:25 AM  Roy Moore  has presented today for surgery, with the diagnosis of ELEVATED PSA, BLADDER STONE AND BENIGN PROSTATIC HYPERPLASIA.  The various methods of treatment have been discussed with the patient and family. After consideration of risks, benefits and other options for treatment, the patient has consented to  Procedure(s) with comments: BIOPSY TRANSRECTAL ULTRASONIC PROSTATE (TUBP) ([name]) - 60 MINUTE CASE CYSTOSCOPY WITH REMOVAL OF BLADDER STONES ([name]) TRANSURETHRAL INCISION OF THE PROSTATE (TUIP) ([name]) as a surgical intervention.  The patient's history has been reviewed, patient examined, no change in status, stable for surgery.  I have reviewed the patient's chart and labs.  Questions were answered to the patient's satisfaction.     [name]

## 2023-02-27 ENCOUNTER — Encounter (HOSPITAL_BASED_OUTPATIENT_CLINIC_OR_DEPARTMENT_OTHER): Payer: Self-pay | Admitting: Urology

## 2023-02-27 DIAGNOSIS — C61 Malignant neoplasm of prostate: Secondary | ICD-10-CM | POA: Diagnosis not present

## 2023-02-27 NOTE — Progress Notes (Signed)
Dr. Molli Hazard and RNs discussed w/ pt to wear leg bag during day and larger foley bag at night.  Pt states he has worn the leg bag consistently for the last 2 months.  Explained to pt why it's important to do as above. He states he gets up during the night and empties the leg bag.  Dr. Molli Hazard instructed pt that as long as he gets up at least once during night and empties the leg bag, then he can wear it all the time.  Instructed pt that the office will call for return appt in several days for catheter removal. Pt voiced understanding to all instructions.

## 2023-02-27 NOTE — Discharge Summary (Signed)
Date of admission: 02/26/2023  Date of discharge: 02/27/2023  Admission diagnosis: Elevated, PSA, urinary retention and bladder stones  Discharge diagnosis: same  Secondary diagnoses:  Patient Active Problem List   Diagnosis Date Noted   BPH with obstruction/lower urinary tract symptoms 02/26/2023   Pneumonia 06/07/2022   Chest pain 06/07/2022   Hypertension 06/07/2022   AKI (acute kidney injury) (HCC) 06/07/2022   Allergies 06/05/2021   Extensor tenosynovitis of left wrist 09/20/2018   PAD (peripheral artery disease) (HCC) 07/24/2016   Peripheral arterial disease (HCC) 07/17/2016   Lumbar stenosis with neurogenic claudication 12/03/2015   CVA, old, hemiparesis (HCC) 03/24/2011   ANXIETY DISORDER 10/04/2007   TOBACCO ABUSE 09/30/2007   GERD 09/30/2007   Hyperlipidemia 09/15/2007   Hemiplegia, late effect of cerebrovascular disease (HCC) 09/15/2007    Procedures performed: Procedure(s): BIOPSY TRANSRECTAL ULTRASONIC PROSTATE (TUBP) CYSTOSCOPY WITH REMOVAL OF BLADDER STONES TRANSURETHRAL INCISION OF THE PROSTATE (TUIP)  History and Physical: For full details, please see admission history and physical. Briefly, Roy Moore is a 68 y.o. year old patient with urinary retention, elevated PSA and bladder stones who underwent prostate biopsy, cystoscopy, cystolitholapaxy, and TUIP in the OR.   Hospital Course: Patient tolerated the procedure well.  He was then transferred to the floor after an uneventful PACU stay.  His hospital course was uncomplicated.  On POD#1 he had met discharge criteria: was eating a regular diet, was up and ambulating independently,  pain was well controlled. He was unable to void in the AM and found to have 450 cc of urine in his bladder. A 18 french coude catheter was replaced with return of clear yellow urine. At this point he was fine for discharge.   Plan to follow up next week for void trial.    Laboratory values:  Recent Labs    02/26/23 1059   HGB 13.6  HCT 40.0   Recent Labs    02/26/23 1059  NA 141  K 3.8  CL 107  GLUCOSE 101*  BUN 16  CREATININE 1.50*   No results for input(s): "LABPT", "INR" in the last 72 hours. No results for input(s): "LABURIN" in the last 72 hours. Results for orders placed or performed during the hospital encounter of 01/08/23  Urine Culture     Status: None   Collection Time: 01/09/23 12:51 AM   Specimen: Urine, Random  Result Value Ref Range Status   Specimen Description   Final    URINE, RANDOM Performed at [facility], [address], [place], [place] [address]    Special Requests   Final    NONE Reflexed from [id number] Performed at [facility], [address] [place], [place] [address]    Culture   Final    NO GROWTH Performed at [facility], [address], [place], [place] [address]    Report Status 01/10/2023 FINAL  Final    Physical Exam  Gen: NAD Resp: Satting well on RA Card: Regular rate Abd: Soft, appropriately tender, ND, incision clean dry and intact GU:  Foley catheter in place draining urine. Neuro: Alert   Disposition: Home  Discharge instruction: The patient was instructed to be ambulatory but told to refrain from heavy lifting, strenuous activity, or driving.   Discharge medications:  Allergies as of 02/27/2023       Reactions   Elavil [amitriptyline] Other (See Comments)   Pt tried to use for sleep, medication had opposite effect.  Medication List     TAKE these medications    amLODipine 10 MG tablet Commonly known as: NORVASC Take 1 tablet (10 mg total) by mouth daily.   atorvastatin 20 MG tablet Commonly known as: Lipitor Take 1 tablet (20 mg total) by mouth daily.   clopidogrel 75 MG tablet Commonly known as: Plavix Take 1 tablet (75 mg total) by mouth daily.   loratadine 10 MG tablet Commonly known as: CLARITIN Take 1 tablet (10 mg total) by mouth daily.   tamsulosin 0.4 MG Caps  capsule Commonly known as: FLOMAX Take 1 capsule (0.4 mg total) by mouth daily.        Followup:   Follow-up Information     [facility] Follow up on 03/19/2023.   Why: 1230p Contact information: [address] [place] [address] [phone]

## 2023-03-01 ENCOUNTER — Encounter (HOSPITAL_BASED_OUTPATIENT_CLINIC_OR_DEPARTMENT_OTHER): Payer: Self-pay | Admitting: Urology

## 2023-03-01 LAB — SURGICAL PATHOLOGY

## 2023-03-04 LAB — CALCULI, WITH PHOTOGRAPH (CLINICAL LAB)
Calcium Oxalate Dihydrate: 20 %
Calcium Oxalate Monohydrate: 70 %
Hydroxyapatite: 10 %
Weight Calculi: 390 mg

## 2023-03-10 ENCOUNTER — Other Ambulatory Visit (HOSPITAL_COMMUNITY): Payer: Self-pay | Admitting: Adult Health

## 2023-03-10 DIAGNOSIS — C61 Malignant neoplasm of prostate: Secondary | ICD-10-CM | POA: Diagnosis not present

## 2023-03-10 DIAGNOSIS — D400 Neoplasm of uncertain behavior of prostate: Secondary | ICD-10-CM

## 2023-03-18 ENCOUNTER — Encounter (HOSPITAL_COMMUNITY)
Admission: RE | Admit: 2023-03-18 | Discharge: 2023-03-18 | Disposition: A | Discharge status: Home / Self Care | Payer: No Typology Code available for payment source | Source: Ambulatory Visit | Attending: Adult Health | Admitting: Adult Health

## 2023-03-18 DIAGNOSIS — D400 Neoplasm of uncertain behavior of prostate: Secondary | ICD-10-CM | POA: Insufficient documentation

## 2023-03-18 MED ORDER — FLOTUFOLASTAT F 18 GALLIUM 296-5846 MBQ/ML IV SOLN
8.1080 | Freq: Once | INTRAVENOUS | Status: AC
  Administered 2023-03-18: 8.108 via INTRAVENOUS
  Filled 2023-03-18: qty 9

## 2023-04-08 DIAGNOSIS — Z79899 Other long term (current) drug therapy: Secondary | ICD-10-CM | POA: Diagnosis not present

## 2023-04-08 DIAGNOSIS — E559 Vitamin D deficiency, unspecified: Secondary | ICD-10-CM | POA: Diagnosis not present

## 2023-05-24 ENCOUNTER — Telehealth: Payer: Self-pay

## 2023-05-24 NOTE — Telephone Encounter (Signed)
 I called pt to introduce myself as the Coordinator of the [facility].   1. I confirmed with the patient he is aware of his referral to the clinic 5/9, arriving @ 8:00 am.    2. I discussed the format of the clinic and the physicians he will be seeing that day.   3. I discussed where the clinic is located and how to contact me.   4. I confirmed his address and informed him I would be mailing a packet of information and forms to be completed. I asked him to bring them with him the day of his appointment.    He voiced understanding of the above. I asked him to call me if he has any questions or concerns regarding his appointments or the forms he needs to complete.

## 2023-06-01 NOTE — Progress Notes (Signed)
 RN spoke with patient and reviewed upcoming PMDC on 5/9.

## 2023-06-01 NOTE — Progress Notes (Signed)
                               Care Plan Summary  Name: Roy Moore DOB: 1955-03-03   Your Medical Team:   Urologist -  Dr. Florencio Hunting, [facility]  Radiation Oncologist - Dr. Kenith Payer, [facility]   Medical Oncologist - Dr. Janeann Mean, [facility]  Recommendations: 1) Hormonal Therapy 2) Radiation    * These recommendations are based on information available as of today's consult.      Recommendations may change depending on the results of further tests or exams.    Next Steps: 1) Consider all your options.  Contact Paola Bohr, your nurse navigator, with any questions or treatment decisions.     When appointments need to be scheduled, you will be contacted by [facility] and/or [facility].  Questions?  Please do not hesitate to call Katheleen Palmer, BSN, RN at [phone] with any questions or concerns.  Paola Bohr is your Oncology Nurse Navigator and is available to assist you while you're receiving your medical care at [facility].

## 2023-06-03 DIAGNOSIS — C61 Malignant neoplasm of prostate: Secondary | ICD-10-CM | POA: Insufficient documentation

## 2023-06-03 NOTE — Progress Notes (Signed)
 [facility] CONSULT NOTE  Patient Care Team: Pcp, No as PCP - General Agustina Aldrich, MD as Consulting Physician (Neurosurgery) Roy Shell, MD as Consulting Physician (Vascular Surgery) [facility] as Attending Physician Katheleen Palmer, RN as Oncology Nurse Navigator  ASSESSMENT & PLAN:  Roy Moore is a 68 y.o.male with history of CAD, PVD, HLD, CVA, emphysema being seen at [facility] for prostate cancer.  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 PET did not show metastases. PSA 7.1. most consistent with unfavorable intermediate risk disease.  Current diagnosis: cT1cN0M0 GG3. PSA 7.1  Treatment: to be determined  His case was discussed at tumor board with multiple specialists including radiation oncologist, urology oncologist, pathologist, radiologist. The patient was counseled on the natural history of prostate cancer and the standard treatment options that are available for prostate cancer.   For unfavorable intermediate risk, per NCCN guideline, in patients with > 10 years of life expectancy, RP + PLND or RT + ADT are both options resulted in excellent long term survival.  Treatment is recommended. Patient was encouraged to consider the side effect profiles of each treatment modality and make an informed decision for definitive treatment.  Patient will follow-up with radiation oncology or urologic oncology for definitive treatment.  He may follow-up with medical oncology as needed.  Supportive calcium  (1000-1200 mg daily from food and supplements) and vitamin D3 (1000 IU daily) Healthy lifestyle to prevent diabetes and CV disease Weight-bearing exercises (30 minutes per day) Limit alcohol consumption and avoid smoking  All questions were answered. The patient knows to call the clinic with any problems, questions or concerns. No barriers to learning was detected.  Lowanda Ruddy, MD 5/9/202512:28 PM  CHIEF COMPLAINTS/PURPOSE OF CONSULTATION:   Prostate cancer  HISTORY OF PRESENTING ILLNESS:  Roy Moore 68 y.o. male is here because of prostate cancer.  I have reviewed his chart and materials related to his cancer extensively and collaborated history with the patient. Summary of oncologic history is as follows:    Oncology History  Prostatic adenocarcinoma (HCC)  01/19/2023 Tumor Marker   PSA 7.1   02/26/2023 Pathology Results   Ultrasound-guided transrectal prostate biopsy.  6 4+3 = 7 cores. GG3. 4 3+4=7 cores.   02/26/2023 Cancer Staging   Staging form: Prostate, AJCC 8th Edition - Clinical stage from 02/26/2023: Stage IIC (cT1c, cN0, cM0, PSA: 7.1, Grade Group: 3) - Signed by Keitha Pata, PA-C on 06/04/2023 Histopathologic type: Adenocarcinoma, NOS Stage prefix: Initial diagnosis Prostate specific antigen (PSA) range: Less than 10 Gleason primary pattern: 4 Gleason secondary pattern: 3 Gleason score: 7 Histologic grading system: 5 grade system Number of biopsy cores examined: 12 Number of biopsy cores positive: 12 Location of positive needle core biopsies: Both sides   03/18/2023 PET scan   PSMA PET IMPRESSION: 1. Heterogeneous paramidline tracer affinity likely Represents asite of primary or primaries. 2. No evidence of tracer avid metastatic disease. 3. Tracer affinity in the left lower lobe, corresponding to airspace disease and ground-glass opacity. Favor acute/subacute pneumonia over postinfectious/inflammatory scarring.  4. Incidental findings, including: Aortic atherosclerosis (ICD10-I70.0), coronary artery atherosclerosis and emphysema (ICD10-J43.9). Bilateral nephrolithiasis.   06/03/2023 Initial Diagnosis   Prostatic adenocarcinoma [facility])      MEDICAL HISTORY:  Past Medical History:  Diagnosis Date   Anxiety    Arthritis    "all joints"   Atherosclerosis of native artery of right lower extremity with intermittent claudication (HCC) 05/2016   (01-30-202 pt stated pain in leg/  feet when  walking long time r>l) previouly followed by vascular--- dr Charlotte Cookey (lov note in epic 11-23-2016, not followed since);   06-09-2016 s/p right SFA endovascular stenting for occlusion;  reoccluded 07-24-2016 s/p right fem-pop above knee artery bypass graft w/ ipsilateral non-reversed GSV,  at last visit ABI w/ elevated velocities at the distal anastomosis   Benign localized prostatic hyperplasia with lower urinary tract symptoms (LUTS)    Bladder stones    Carotid stenosis, symptomatic, with infarction [facility])    history CVA 08/ 2009  d/ t acute right carotid ICA occlusion   CKD (chronic kidney disease), stage III (HCC)    Depression    Elevated PSA    Environmental and seasonal allergies    H/O urinary retention 12/2022   History of cerebrovascular accident (CVA) with residual deficit 09/15/2007   (02-25-2023 pt stated only residual Left arm weaker that right but not profound) admission in epic;   right MCA watershed infarcts w/ remote left PCA watershed infarcts;  treated w/ TPA in ED ,  suspected due to acute right occluded ICA and diagnosis w/ bilateral occlusion ICA @ the skull base with bilateral collateral reconstitution   History of kidney stones    Hyperlipidemia    Hypertension    Neuropathy    bilateral foot intermitent numbness/ pain   PAD (peripheral artery disease) (HCC)    Smokers' cough (HCC)    pt stated is productive in morning w/ clear phlegm   Stroke (cerebrum) (HCC)    Wears glasses     SURGICAL HISTORY: Past Surgical History:  Procedure Laterality Date   ABDOMINAL AORTOGRAM W/LOWER EXTREMITY [name] 06/09/2016   Procedure: Abdominal Aortogram w/Lower Extremity;  Surgeon: Roy Shell, MD;  [facility];  Service: Cardiovascular;  Laterality: [name];   ANTERIOR CERVICAL DECOMP/DISCECTOMY FUSION  10/23/2003   @[facility]  by dr h. pool;   C3---C7   COLONOSCOPY WITH PROPOFOL   12/10/2016   dr stark   CYSTOSCOPY WITH LITHOLAPAXY [name] 02/26/2023   Procedure:  CYSTOSCOPY WITH REMOVAL OF BLADDER STONES;  Surgeon: Homero Luster, MD;  Location: [facility];  Service: Urology;  Laterality: [name];   CYSTOSCOPY/URETEROSCOPY/HOLMIUM LASER/STENT PLACEMENT Left 04/08/2001   @[facility]  by dr Joie Narrow:   FEMORAL-POPLITEAL BYPASS GRAFT Right 07/24/2016   Procedure: RIGHT FEMORAL TO ABOVE KNEE POPLITEAL ARTERY BYPASS GRAFT;  Surgeon: Roy Shell, MD;  Location: [facility];  Service: Vascular;  Laterality: Right;   KNEE ARTHROSCOPY Left 1990   LUMBAR LAMINECTOMY/DECOMPRESSION MICRODISCECTOMY  08/04/1999   @[facility]  by dr h. pool;   L3--4   LUMBAR LAMINECTOMY/DECOMPRESSION MICRODISCECTOMY Bilateral 12/03/2015   Procedure: Microdiscectomy - bilateral - Lumbar four-lumbar five;  Surgeon: Agustina Aldrich, MD;  [facility];  Service: Neurosurgery;  Laterality: Bilateral;   MANDIBLE FRACTURE SURGERY     yrs ago w/ wires;    then  laser hardware removal   PERIPHERAL VASCULAR INTERVENTION Right 06/09/2016   Procedure: Peripheral Vascular Intervention;  Surgeon: Roy Shell, MD;  Location: [facility];  Service: Cardiovascular;  Laterality: Right;  SFA   PROSTATE BIOPSY [name] 02/26/2023   Procedure: BIOPSY TRANSRECTAL ULTRASONIC PROSTATE (TUBP);  Surgeon: Homero Luster, MD;  Location: [facility];  Service: Urology;  Laterality: [name];   REPAIR EXTENSOR TENDON Left 10/12/2018   Procedure: TENDON SHEATH RELEASE/TENOLYSIS;  Surgeon: Wes Hamman, MD;  Location: [facility];  Service: Orthopedics;  Laterality: Left;   TRANSURETHRAL INCISION OF PROSTATE [name] 02/26/2023  Procedure: TRANSURETHRAL INCISION OF THE PROSTATE (TUIP);  Surgeon: Homero Luster, MD;  Location: [facility];  Service: Urology;  Laterality: [name];    SOCIAL HISTORY: Social History   Socioeconomic History   Marital status: Widowed    Spouse name: Not on file   Number of children: Not on file   Years of education: Not on file   Highest education level:  Not on file  Occupational History   Not on file  Tobacco Use   Smoking status: Every Day    Current packs/day: 0.50    Average packs/day: 0.5 packs/day for 47.0 years (23.5 ttl pk-yrs)    Types: Cigarettes   Smokeless tobacco: Never   Tobacco comments:    02-25-2023  smokes 1/2-- 3/4 ppd,   started age 68 (66)   62yrs  Vaping Use   Vaping status: Never Used  Substance and Sexual Activity   Alcohol use: Yes    Alcohol/week: 3.0 standard drinks of alcohol    Types: 3 Cans of beer per week    Comment: weekly   Drug use: Not Currently    Comment: 02-25-2023 pt stated only in younger yrs   Sexual activity: Not Currently  Other Topics Concern   Not on file  Social History Narrative   Wife is bedridden and he is primary caregiver---no family.   Social Drivers of Corporate investment banker Strain: Low Risk  (10/20/2022)   Overall Financial Resource Strain (CARDIA)    Difficulty of Paying Living Expenses: Not very hard  Food Insecurity: No Food Insecurity (06/04/2023)   Hunger Vital Sign    Worried About Running Out of Food in the Last Year: Never true    Ran Out of Food in the Last Year: Never true  Transportation Needs: No Transportation Needs (06/04/2023)   PRAPARE - Administrator, Civil Service (Medical): No    Lack of Transportation (Non-Medical): No  Physical Activity: Inactive (10/20/2022)   Exercise Vital Sign    Days of Exercise per Week: 0 days    Minutes of Exercise per Session: 0 min  Stress: Stress Concern Present (10/20/2022)   [employer] of Occupational Health - Occupational Stress Questionnaire    Feeling of Stress : To some extent  Social Connections: Socially Isolated (10/20/2022)   Social Connection and Isolation Panel [NHANES]    Frequency of Communication with Friends and Family: Twice a week    Frequency of Social Gatherings with Friends and Family: Never    Attends Religious Services: Never    Database administrator or Organizations: Yes     Attends Banker Meetings: Never    Marital Status: Widowed  Intimate Partner Violence: Not At Risk (06/04/2023)   Humiliation, Afraid, Rape, and Kick questionnaire    Fear of Current or Ex-Partner: No    Emotionally Abused: No    Physically Abused: No    Sexually Abused: No    FAMILY HISTORY: Family History  Problem Relation Age of Onset   Colon cancer Neg Hx    Rectal cancer Neg Hx    Stomach cancer Neg Hx    Esophageal cancer Neg Hx     ALLERGIES:  is allergic to elavil  [amitriptyline ].  MEDICATIONS:  Current Outpatient Medications  Medication Sig Dispense Refill   amLODipine  (NORVASC ) 10 MG tablet Take 1 tablet (10 mg total) by mouth daily. (Patient taking differently: Take 10 mg by mouth daily.) 30 tablet 1   atorvastatin  (LIPITOR) 20 MG  tablet Take 1 tablet (20 mg total) by mouth daily. (Patient taking differently: Take 20 mg by mouth daily.) 90 tablet 0   clopidogrel  (PLAVIX ) 75 MG tablet Take 1 tablet (75 mg total) by mouth daily. (Patient taking differently: Take 75 mg by mouth daily.) 90 tablet 1   escitalopram (LEXAPRO) 10 MG tablet Take 10 mg by mouth at bedtime.     loratadine  (CLARITIN ) 10 MG tablet Take 1 tablet (10 mg total) by mouth daily. (Patient taking differently: Take 10 mg by mouth daily as needed for allergies.) 100 tablet 11   tamsulosin  (FLOMAX ) 0.4 MG CAPS capsule Take 1 capsule (0.4 mg total) by mouth daily. 30 capsule 11   No current facility-administered medications for this visit.    REVIEW OF SYSTEMS:   All relevant systems were reviewed with the patient and are negative.  PHYSICAL EXAMINATION: ECOG PERFORMANCE STATUS: 0 - Asymptomatic  GENERAL: alert, no distress and comfortable  LABORATORY DATA:  I have reviewed the results of PSA.  RADIOGRAPHIC STUDIES: I have reviewed the radiological images in tumor board.

## 2023-06-03 NOTE — Progress Notes (Signed)
 Radiation Oncology         [phone]) [phone] ________________________________  Multidisciplinary Prostate Cancer Clinic  Initial Radiation Oncology Consultation  Name: Roy Moore MRN: [id number]  Date: 06/04/2023  DOB: Jul 16, 1955  CC:Pcp, No  Roy Moore   REFERRING PHYSICIAN: Homero Luster, Moore  DIAGNOSIS: 68 y.o. gentleman with stage T1c adenocarcinoma of the prostate with a Gleason's score of 4+3 and a PSA of 7.1    ICD-10-CM   1. Malignant neoplasm of prostate (HCC)  C61       HISTORY OF PRESENT ILLNESS::Roy Moore is a 68 y.o. gentleman. He was initially referred to Dr. Inga Moore at [facility] in 01/2016, for an elevated PSA of 7.3. A repeat PSA at the time of consultation had decreased to 4. A biopsy was recommended at that time, but he was lost to follow up thereafter. More recently, he developed urinary retention in 12/2022 requiring ED evaluation/management for this on 12/28/22 and again 01/08/23 for catheter placement. He was referred back to urology on 01/08/23 and saw Roy Last, PA-C with successful voiding trial. Unfortunately, he was unable to void on his own and had to present back to the ED later that night for replacement of the foley catheter.  A PSA was checked at the time of his follow up visit 01/21/24 and remained elevated at 7.1. He was started on Flomax  and had urodynamics on 02/24/23 that did show some detrusor instability. He subsequently underwent TUIP with transrectal ultrasound for biopsy of the prostate on 02/26/23.  The prostate volume measured 26 cc.  Out of 10 core biopsies, all 10 were positive.  The maximum Gleason score was 4+3, and this was seen in the right base lateral, right base, left base lateral, left base, left mid lateral, and left apex lateral. Additionally, Gleason 3+4 was seen in the remaining 4 cores. Of note, perineural invasion was also noted in all 10 cores.  He underwent staging PSMA PET scan on 03/18/23 showing no evidence of  disease outside of the prostate. Incidentally noted was tracer affinity in the left lower lobe lung, favored to be associated with pneumonia nut warrants follow up with dedicated CT Chest imaging in the future given he is a current, long-time smoker. We discussed the recommendation of enrolling in a lung cancer screening program and this will be discussed further with his PCP once he establishes care.  The patient reviewed the biopsy and imaging results with his urologist and he has kindly been referred today to the multidisciplinary prostate cancer clinic for presentation of pathology and radiology studies in our conference for discussion of potential radiation treatment options and clinical evaluation.  He is accompanied by a friend of his son, for today's visit.  PREVIOUS RADIATION THERAPY: No  PAST MEDICAL HISTORY:  has a past medical history of Anxiety, Arthritis, Atherosclerosis of native artery of right lower extremity with intermittent claudication (HCC) (05/2016), Benign localized prostatic hyperplasia with lower urinary tract symptoms (LUTS), Bladder stones, Carotid stenosis, symptomatic, with infarction (HCC), CKD (chronic kidney disease), stage III (HCC), Depression, Elevated PSA, Environmental and seasonal allergies, H/O urinary retention (12/2022), History of cerebrovascular accident (CVA) with residual deficit (09/15/2007), History of kidney stones, Hyperlipidemia, Hypertension, Neuropathy, PAD (peripheral artery disease) (HCC), Smokers' cough (HCC), Stroke (cerebrum) (HCC), and Wears glasses.    PAST SURGICAL HISTORY: Past Surgical History:  Procedure Laterality Date   ABDOMINAL AORTOGRAM W/LOWER EXTREMITY [name] 06/09/2016   Procedure: Abdominal Aortogram w/Lower Extremity;  Surgeon: Roy Shell, Moore;  Location: [facility];  Service: Cardiovascular;  Laterality: [name];   ANTERIOR CERVICAL DECOMP/DISCECTOMY FUSION  10/23/2003   @[facility]  by dr Roy Moore;   C3---C7   COLONOSCOPY WITH  PROPOFOL   12/10/2016   dr stark   CYSTOSCOPY WITH LITHOLAPAXY [name] 02/26/2023   Procedure: CYSTOSCOPY WITH REMOVAL OF BLADDER STONES;  Surgeon: Roy Moore;  Location[facility];  Service: Urology;  Laterality: [name];   CYSTOSCOPY/URETEROSCOPY/HOLMIUM LASER/STENT PLACEMENT Left 04/08/2001   @[facility]  by dr Roy Moore:   FEMORAL-POPLITEAL BYPASS GRAFT Right 07/24/2016   Procedure: RIGHT FEMORAL TO ABOVE KNEE POPLITEAL ARTERY BYPASS GRAFT;  Surgeon: Roy Shell, Moore;  Location: [facility];  Service: Vascular;  Laterality: Right;   KNEE ARTHROSCOPY Left 1990   LUMBAR LAMINECTOMY/DECOMPRESSION MICRODISCECTOMY  08/04/1999   @[facility]  by dr Roy Moore;   L3--4   LUMBAR LAMINECTOMY/DECOMPRESSION MICRODISCECTOMY Bilateral 12/03/2015   Procedure: Microdiscectomy - bilateral - Lumbar four-lumbar five;  Surgeon: Roy Aldrich, Moore;  Location: [facility];  Service: Neurosurgery;  Laterality: Bilateral;   MANDIBLE FRACTURE SURGERY     yrs ago w/ wires;    then  laser hardware removal   PERIPHERAL VASCULAR INTERVENTION Right 06/09/2016   Procedure: Peripheral Vascular Intervention;  Surgeon: Roy Shell, Moore;  Location: [facility];  Service: Cardiovascular;  Laterality: Right;  SFA   PROSTATE BIOPSY [name] 02/26/2023   Procedure: BIOPSY TRANSRECTAL ULTRASONIC PROSTATE (TUBP);  Surgeon: Roy Moore;  Location[facility];  Service: Urology;  Laterality: [name];   REPAIR EXTENSOR TENDON Left 10/12/2018   Procedure: TENDON SHEATH RELEASE/TENOLYSIS;  Surgeon: Roy Hamman, Moore;  Location: [facility];  Service: Orthopedics;  Laterality: Left;   TRANSURETHRAL INCISION OF PROSTATE [name] 02/26/2023   Procedure: TRANSURETHRAL INCISION OF THE PROSTATE (TUIP);  Surgeon: Roy Moore;  Location: [facility];  Service: Urology;  Laterality: [name];    FAMILY HISTORY: family history is not on file.  SOCIAL HISTORY:  reports that he has been smoking cigarettes. He has a  23.5 pack-year smoking history. He has never used smokeless tobacco. He reports current alcohol use of about 3.0 standard drinks of alcohol per week. He reports that he does not currently use drugs.  ALLERGIES: Elavil  [amitriptyline ]  MEDICATIONS:  Current Outpatient Medications  Medication Sig Dispense Refill   escitalopram (LEXAPRO) 10 MG tablet Take 10 mg by mouth at bedtime.     amLODipine  (NORVASC ) 10 MG tablet Take 1 tablet (10 mg total) by mouth daily. (Patient taking differently: Take 10 mg by mouth daily.) 30 tablet 1   atorvastatin  (LIPITOR) 20 MG tablet Take 1 tablet (20 mg total) by mouth daily. (Patient taking differently: Take 20 mg by mouth daily.) 90 tablet 0   clopidogrel  (PLAVIX ) 75 MG tablet Take 1 tablet (75 mg total) by mouth daily. (Patient taking differently: Take 75 mg by mouth daily.) 90 tablet 1   loratadine  (CLARITIN ) 10 MG tablet Take 1 tablet (10 mg total) by mouth daily. (Patient taking differently: Take 10 mg by mouth daily as needed for allergies.) 100 tablet 11   tamsulosin  (FLOMAX ) 0.4 MG CAPS capsule Take 1 capsule (0.4 mg total) by mouth daily. 30 capsule 11   No current facility-administered medications for this encounter.    REVIEW OF SYSTEMS:  On review of systems, the patient reports that he is doing well overall. He denies any chest pain, shortness of breath, cough, fevers, chills, night sweats, unintended weight changes. He denies  any bowel disturbances, and denies abdominal pain, nausea or vomiting. He denies any new musculoskeletal or joint aches or pains. His IPSS was 8, indicating mild urinary symptoms on Flomax  and s/p recent TUIP. He has not had any further issues with AUR since the time of his TUIP.  His SHIM was 24, indicating he does not have erectile dysfunction. A complete review of systems is obtained and is otherwise negative.   PHYSICAL EXAM:  Wt Readings from Moore 3 Encounters:  06/04/23 125 lb 6.4 oz (56.9 kg)  02/26/23 132 lb (59.9 kg)   01/08/23 143 lb 4.8 oz (65 kg)   Temp Readings from Moore 3 Encounters:  06/04/23 97.7 F (36.5 C)  02/27/23 98.1 F (36.7 C) (Oral)  01/08/23 98 F (36.7 C)   BP Readings from Moore 3 Encounters:  06/04/23 (!) 145/72  02/27/23 (!) 152/67  01/08/23 (!) 157/73   Pulse Readings from Moore 3 Encounters:  06/04/23 97  02/27/23 68  01/08/23 82    /10  In general this is a well appearing Caucasian man in no acute distress. He's alert and oriented x4 and appropriate throughout the examination. Cardiopulmonary assessment is negative for acute distress and he exhibits normal effort.    KPS = 90  100 - Normal; no complaints; no evidence of disease. 90   - Able to carry on normal activity; minor signs or symptoms of disease. 80   - Normal activity with effort; some signs or symptoms of disease. 63   - Cares for self; unable to carry on normal activity or to do active work. 60   - Requires occasional assistance, but is able to care for most of his personal needs. 50   - Requires considerable assistance and frequent medical care. 40   - Disabled; requires special care and assistance. 30   - Severely disabled; hospital admission is indicated although death not imminent. 20   - Very sick; hospital admission necessary; active supportive treatment necessary. 10   - Moribund; fatal processes progressing rapidly. 0     - Dead  Karnofsky DA, Abelmann WH, Craver LS and Burchenal [facility] [phone]) The use of the nitrogen mustards in the palliative treatment of carcinoma: with particular reference to bronchogenic carcinoma Cancer 1 634-56   LABORATORY DATA:  Lab Results  Component Value Date   WBC 17.3 (H) 12/28/2022   HGB 13.6 02/26/2023   HCT 40.0 02/26/2023   MCV 86.7 12/28/2022   PLT 364 12/28/2022   Lab Results  Component Value Date   NA 141 02/26/2023   K 3.8 02/26/2023   CL 107 02/26/2023   CO2 18 (L) 12/28/2022   Lab Results  Component Value Date   ALT 12 06/07/2022   AST 16  06/07/2022   GGT 175 (H) 10/08/2016   ALKPHOS 65 06/07/2022   BILITOT 0.4 06/07/2022     RADIOGRAPHY: No results found.    IMPRESSION/PLAN: 68 y.o. gentleman with Stage T1c adenocarcinoma of the prostate with a Gleason's score of 4+3 and a PSA of 7.1  We discussed the patient's workup and outlined the nature of prostate cancer in this setting. The patient's T stage, Gleason's score, and PSA put him into the unfavorable intermediate risk group. Accordingly, he is eligible for 5.5 weeks of external radiation concurrent with ST-ADT or prostatectomy. We discussed the available radiation techniques, and focused on the details and logistics of delivery. He is not a candidate for brachytherapy with his history of urinary retention requiring TUIP. Therefore,  we discussed and outlined the risks, benefits, short and long-term effects associated with daily external beam radiotherapy and compared and contrasted these with prostatectomy. We discussed the role of SpaceOAR gel in reducing the rectal toxicity associated with radiotherapy. We also detailed the role of ADT in the treatment of unfavorable intermediate risk prostate cancer and outlined the associated side effects that could be expected with this therapy. He appears to have a good understanding of his disease and our treatment recommendations which are of curative intent.  He was encouraged to ask questions that were answered to his stated satisfaction.  At the end of the conversation the patient is leaning towards proceeding with a 5-1/2-week course of daily external beam radiotherapy concurrent with ST-ADT but would like to take some additional time to discuss the treatment options with his son who was unable to be present for the visit today.  We will share our discussion with Dr. Inga Moore and he has our contact information and will let us  know once he reaches a final decision. If he elects to proceed with radiotherapy, we will help to coordinate for ADT now  and fiducial markers and SpaceOAR gel placement in July 2025, in anticipation of beginning his daily treatments approximately 2 months from the start of ADT.  We enjoyed meeting him today and look forward to continuing to participate in his care.  2. Tobacco abuse: The patient is counseled on the importance of smoking cessation but reports that he is not ready to quit smoking.  We reviewed the abnormal findings on his recent PSMA PET scan that was performed for disease staging of his prostate cancer showing multifocal low-level tracer affinity within the lungs with surrounding groundglass opacity in the left lower lobe lung, felt most likely indicative of scarring.  We would recommend follow-up with his PCP and to consider enrolling in a lung cancer screening program for annual, low-dose screening CT chest scans since he is a current, longtime smoker.  He is in the process of changing PCPs due to his change in insurance and will plan to discuss this further with his PCP once he establishes care.  We personally spent 60 minutes in this encounter including chart review, reviewing radiological studies, meeting face-to-face with the patient, entering orders and completing documentation.    Arta Bihari, PA-C    Kenith Payer, Moore  [facility]  Radiation Oncology Direct Dial: [phone]  Fax: [phone] [email or web address]  Skype  [email or web address]   This document serves as a record of services personally performed by Kenith Payer, Moore and Keitha Pata, PA-C. It was created on their behalf by Florance Hun, a trained medical scribe. The creation of this record is based on the scribe's personal observations and the provider's statements to them. This document has been checked and approved by the attending provider.

## 2023-06-04 ENCOUNTER — Inpatient Hospital Stay

## 2023-06-04 ENCOUNTER — Ambulatory Visit
Admission: RE | Admit: 2023-06-04 | Discharge: 2023-06-04 | Disposition: A | Discharge status: Home / Self Care | Source: Ambulatory Visit | Attending: Radiation Oncology | Admitting: Radiation Oncology

## 2023-06-04 ENCOUNTER — Encounter: Payer: Self-pay | Admitting: Radiation Oncology

## 2023-06-04 ENCOUNTER — Telehealth: Payer: Self-pay | Admitting: Genetic Counselor

## 2023-06-04 VITALS — BP 145/72 | HR 97 | Temp 97.7°F | Resp 20 | Ht 69.0 in | Wt 125.4 lb

## 2023-06-04 DIAGNOSIS — Z79899 Other long term (current) drug therapy: Secondary | ICD-10-CM | POA: Insufficient documentation

## 2023-06-04 DIAGNOSIS — C61 Malignant neoplasm of prostate: Secondary | ICD-10-CM

## 2023-06-04 DIAGNOSIS — F17210 Nicotine dependence, cigarettes, uncomplicated: Secondary | ICD-10-CM | POA: Insufficient documentation

## 2023-06-04 DIAGNOSIS — Z191 Hormone sensitive malignancy status: Secondary | ICD-10-CM | POA: Diagnosis not present

## 2023-06-04 DIAGNOSIS — Z7902 Long term (current) use of antithrombotics/antiplatelets: Secondary | ICD-10-CM | POA: Insufficient documentation

## 2023-06-04 HISTORY — DX: Cerebral infarction, unspecified: I63.9

## 2023-06-04 HISTORY — DX: Depression, unspecified: F32.A

## 2023-06-04 NOTE — Consult Note (Signed)
 [facility]Disciplinary [facility]     06/04/2023   --------------------------------------------------------------------------------   Roy Moore. Roy Moore  MRN: [id number]  DOB: [birth year], 68 year old Male  SSN: [id number]   PRIMARY CARE:     REFERRING:  Roy Moore B. Roy Moore, M  PROVIDER:  Homero Moore, M.D.  TREATING:  Roy Moore, M.D.  LOCATION:  [facility] [phone]     --------------------------------------------------------------------------------   CC/HPI: CC: Prostate Cancer   Physician requesting consult: Dr. Homero Moore  PCP:  Location of consult: [facility] - Prostate Cancer Multidisciplinary Clinic   Roy Moore is a 68 year old gentleman who Dr. Inga Moore had seen in 2018 for an abnormal DRE and elevated PSA of 4.0. He was recommended to undergo a prostate biopsy but did not follow up. He developed urinary retention in December 2024 requiring catheter placement with over 900 cc in his bladder. His prostate was not particularly large and eventually was measured at 26 cc. Urodynamics indicated bladder instability and obstruction. He underwent a TUIP of the prostate on 02/26/23 and prostate biopsy at the same time. He was able to pass his subsequent voiding trial. However, his biopsy did indicate Gleason 4+3=7 adenocarcinoma with 10 out of 10 biopsy cores positive for malignancy.   Family history: None.   Imaging studies: PSMA PET scan (03/18/23) - No metastatic disease. Prostate uptake. Incidental findings of non-obstructing bilateral nephrolithasis and left lower lobe lung airspace disease possibly due to recent infection.   PMH: He has a history of hypertension, stroke/TIA (Plavix ), hyperlipidemia, and depression. He does continue to have some left upper extremity deficits. He was noted to have significant bladder instability on his urodynamic evaluation. He has also had multiple TIAs.  PSH: No abdominal surgeries.   TNM stage: cT2b N0 M0  PSA: 7.10   Gleason score: 4+3=7 (GG 3)  Biopsy (02/26/23): 10/10 cores positive  Left: L apex (90%, 4+3=7, PNI), L lateral mid (90%, 4+3=7, PNI), L mid (90%, 3+4=7, PNI), L lateral base (90%, 4+3=7, PNI), L base (90%, 4+3=7, PNI)  Right: R apex (60%, 3+4=7, PNI), R mid (90%, 3+4=7, PNI), R lateral mid (90%, 3+4=7, PNI), R base (80%, 4+3=7, PNI), R lateral base (90%, 4+3=7, PNI)  Prostate volume: 26 cc   Nomogram  OC disease: 12%  EPE: 87%  SVI: 38%  LNI: 41%  PFS (5 year, 10 year): 34%, 21%   Urinary function: IPSS is 8.  Erectile function: SHIM score is 24.     ALLERGIES: No Allergies    MEDICATIONS: Plavix  75 MG Tablet 1 tablet PO Daily  Tamsulosin  HCl 0.4 MG Capsule 1 capsule PO Daily  amLODIPine  Besylate 10 MG Tablet 1 tablet PO Daily     GU PSH: Complex cystometrogram, w/ void pressure and urethral pressure profile studies, any technique - 02/24/2023 Complex Uroflow - 02/24/2023, 02/01/2023 Cystoscopy - 02/01/2023 Emg surf Electrd - 02/24/2023 Inject For cystogram - 02/24/2023 Intrabd voidng Press - 02/24/2023 Prostate Needle Biopsy - 2018     NON-GU PSH: Back Surgery (Unspecified), x3 Surgical Pathology, Gross And Microscopic Examination For Prostate Needle - 2018 Visit Complexity (formerly GPC1X) - 04/23/2023, 03/10/2023, 02/02/2023, 01/21/2023, 01/08/2023     GU PMH: Elevated PSA - 04/23/2023, - 03/02/2023, - 02/01/2023, - 01/08/2023, - 2018, - 2018 (Stable), A 68 year old male with a PSA of 7.3 and an 4.0 needs evaluation, especially with a firm prostate. I will recommend ultrasound and biopsy., - 2018, - 2018 Prostate Cancer, He has bulk GG2 and  GG3 prostate cancer with a negative PET. Stage T1c N0 M0. I will get him seen the [facility] for discussion of the treatment options which include RALP, Seeds and EXRT. He would need ST ADT with EXRT. - 04/23/2023, He has high volume GG3 T2c Nx Mx prostate cancer. He has a PET scan scheduled on 2/20 and requested an anxiolytic for claustraphobia. I sent  diazepam and reviewed the side effects and need for a driver. I will have him return to see me with the results of the PET scan and I will start referrals to Radiation oncology and possible our urologic surgeon depending on the PET results. , - 03/10/2023, - 03/02/2023 Prostate nodule w/ LUTS, He is voiding well s/p TUIP. - 04/23/2023, He is voiding since the TUIP. I will have him stay on tamsulosin  for now since he has poor bladder function and I want to maximize his voiding efficacy. , - 03/10/2023, He has a small nodular prostate with an elevated PSA. He needs a prostate US  and biopsy. I reviewed the risks of bleeding, infection and difficulty voiding. he will need to have clearance to get off of the plavix . If he goes back into retention I will consider a TUIP to maximize his voiding chances. , - 02/01/2023 Urinary Retention - 03/10/2023, - 02/24/2023, - 02/23/2023, - 02/02/2023, - 02/01/2023, - 01/21/2023, - 01/08/2023 Weak Urinary Stream - 03/10/2023, - 02/01/2023 Bladder Stone, He has small bladder stones and I will remove them at the time of the prostate biopsy. - 02/01/2023 BPH w/LUTS - 01/21/2023, - 01/08/2023 Nocturia - 01/08/2023 Microscopic hematuria (Stable), urinalysis not performed today0this will need follow-up - 2018 Renal calculus - 2018, - 2018 Hematuria, Unspec - 2018    NON-GU PMH: Depression Hypercholesterolemia Stroke/TIA    FAMILY HISTORY: Family History Unknown    SOCIAL HISTORY: Marital Status: Widowed Preferred Language: English; Ethnicity: Not Hispanic Or Latino; Race: White Current Smoking Status: Patient smokes. Has smoked since 12/26/1972. Smokes 1 pack per day.   Tobacco Use Assessment Completed: Used Tobacco in last 30 days? Drinks 3 drinks per day. Types of alcohol consumed: Beer.  Does not drink caffeine.    REVIEW OF SYSTEMS:    GU Review Male:   Patient denies frequent urination, hard to postpone urination, burning/ pain with urination, get up at night to urinate,  leakage of urine, stream starts and stops, trouble starting your streams, and have to strain to urinate .  Gastrointestinal (Lower):   Patient denies diarrhea and constipation.  Gastrointestinal (Upper):   Patient denies nausea and vomiting.  Constitutional:   Patient denies fever, night sweats, weight loss, and fatigue.  Skin:   Patient denies skin rash/ lesion and itching.  Eyes:   Patient denies blurred vision and double vision.  Ears/ Nose/ Throat:   Patient denies sore throat and sinus problems.  Hematologic/Lymphatic:   Patient denies swollen glands and easy bruising.  Cardiovascular:   Patient denies leg swelling and chest pains.  Respiratory:   Patient denies cough and shortness of breath.  Endocrine:   Patient denies excessive thirst.  Musculoskeletal:   Patient denies back pain and joint pain.  Neurological:   Patient denies headaches and dizziness.  Psychologic:   Patient denies depression and anxiety.   VITAL SIGNS: None   MULTI-SYSTEM PHYSICAL EXAMINATION:    Constitutional: Well-nourished. No physical deformities. Normally developed. Good grooming.     Complexity of Data:  Lab Test Review:   PSA  Records Review:   Pathology Reports, Previous  Patient Records  X-Ray Review: PET- PSMA Scan: Reviewed Films.     01/21/23 02/17/16 11/28/15  PSA  Total PSA 7.10 ng/mL 4.00 ng/dl 7.3 ng/dl  Free PSA  5.78 ng/dl   % Free PSA  15 %    Notes:                     CLINICAL DATA: New diagnosis of prostate cancer with biopsy in  January.   EXAM:  NUCLEAR MEDICINE PET SKULL BASE TO THIGH   TECHNIQUE:  8.1 mCi Flotufolastat (Posluma ) was injected intravenously.  Full-ring PET imaging was performed from the skull base to thigh  after the radiotracer. CT data was obtained and used for attenuation  correction and anatomic localization.   COMPARISON: Abdominopelvic CT of 04/14/2013   FINDINGS:  NECK   No radiotracer activity in neck lymph nodes.   Incidental CT finding:  Bilateral carotid atherosclerosis. No  cervical adenopathy.   CHEST   No radiotracer accumulation within mediastinal or hilar lymph nodes.  Multifocal low-level tracer affinity within the lungs, corresponding  to areas of interstitial thickening which are likely indicative of  scarring. Somewhat more confluent left lower lobe airspace and  surrounding ground-glass opacity including on 83/4 corresponds to  tracer affinity at a S.U.V. max of 3.1. This is new since the remote  abdominal CT.   Incidental CT finding: Moderate centrilobular emphysema. Aortic and  coronary artery calcification.   ABDOMEN/PELVIS   Prostate: Heterogeneous tracer affinity within the central both  anterior and posterior mid prostate measures a S.U.V. max of 6.7.   Lymph nodes: No abnormal radiotracer accumulation within pelvic or  abdominal nodes.   Liver: No tracer affinity   Incidental CT finding: Normal adrenal glands. Bilateral renal  vascular calcifications. Concurrent renal collecting system calculi,  including on the left at up to 5 mm. No hydronephrosis. No  abdominopelvic adenopathy.   SKELETON   No focal activity to suggest skeletal metastasis.   IMPRESSION:  1. Heterogeneous paramidline tracer affinity likely represents a  site of primary or primaries.  2. No evidence of tracer avid metastatic disease.  3. Tracer affinity in the left lower lobe, corresponding to airspace  disease and ground-glass opacity. Favor acute/subacute pneumonia  over postinfectious/inflammatory scarring.  4. Incidental findings, including: Aortic atherosclerosis  (ICD10-I70.0), coronary artery atherosclerosis and emphysema  (ICD10-J43.9). Bilateral nephrolithiasis.    Electronically Signed  By: Lore Rode M.D.  On: 04/05/2023 14:35   PROCEDURES: None   ASSESSMENT:      ICD-10 Details  1 GU:   Prostate Cancer - C61    PLAN:           Document Letter(s):  Created for Patient: Clinical Summary          Notes:   1. Unfavorable intermediate risk prostate cancer: I did detailed discussion with Mr. Heuberger today. He has met with both Dr. Alita Irwin and Dr. Lorri Rota earlier. Although he technically has unfavorable intermediate risk disease, he has many high risk features including palpable disease and extremely high volume disease. The patient was counseled about the natural history of prostate cancer and the standard treatment options that are available for prostate cancer. It was explained to him how his age and life expectancy, clinical stage, Gleason score/prognostic grade group, and PSA (and PSA density) affect his prognosis, the decision to proceed with additional staging studies, as well as how that information influences recommended treatment strategies. We discussed the roles for active surveillance, radiation therapy,  surgical therapy, androgen deprivation, as well as ablative therapy and other investigational options for the treatment of prostate cancer as appropriate to his individual cancer situation. We discussed the risks and benefits of these options with regard to their impact on cancer control and also in terms of potential adverse events, complications, and impact on quality of life particularly related to urinary and sexual function. The patient was encouraged to ask questions throughout the discussion today and all questions were answered to his stated satisfaction. In addition, the patient was provided with and/or directed to appropriate resources and literature for further education about prostate cancer and treatment options.   At this time, he is most inclined to proceed with androgen deprivation therapy and external beam radiation therapy. He is not interested in surgery due to his risks associate with his other medical comorbidities and his desire to minimize his risk of incontinence.   CC: Dr. Homero Moore  Dr. Kenith Payer  Dr. Janeann Mean    E & M CODES: We spent 55 minutes  dedicated to evaluation and management time, including face to face interaction, discussions on coordination of care, documentation, result review, and discussion with others as applicable.

## 2023-06-04 NOTE — Telephone Encounter (Signed)
 Mr. Nishiyama was seen by a genetic counselor during the prostate multidisciplinary clinic on 06/04/2023. He reports no family history of cancer. He does not meet NCCN criteria for genetic testing at this time. He was still offered genetic counseling and testing but declined. We encourage him to contact us  if there are any changes to his personal or family history of cancer. If he meets NCCN criteria based on the updated personal/family history, he would be recommended to have genetic counseling and testing.   Ryonna Cimini, MS, [facility] Genetic Counselor [email or web address] (P) [phone]

## 2023-06-11 ENCOUNTER — Telehealth: Payer: Self-pay | Admitting: *Deleted

## 2023-06-11 NOTE — Telephone Encounter (Signed)
 CALLED PATIENT TO INFORM OF ADT APPT. FOR 07-06-23- ARRIVAL TIME- 12:30 PM @ DR. WRENN'S OFFICE, SPOKE WITH PATIENT AND HE IS AWARE OF THIS APPT.

## 2023-06-11 NOTE — Progress Notes (Signed)
 Patient has confirmed that he would like to proceed with ST-ADT and 5.5 weeks of radiation.  RN reviewed next steps of ADT, and provided additional education.    RN inquired if he has found a PCP to ensure he sets up his lung cancer screening program.  Patient has not found one at this time that accepts his insurance.  I will look into this on patient's behalf to help establish a PCP.    MD's notified of treatment decision, plan of care in progress. Will continue to monitor.

## 2023-06-14 ENCOUNTER — Other Ambulatory Visit: Payer: Self-pay

## 2023-06-14 DIAGNOSIS — Z7689 Persons encountering health services in other specified circumstances: Secondary | ICD-10-CM

## 2023-06-14 NOTE — Progress Notes (Signed)
 Patient will see Dr. Inga Manges on 6/10 for initiation of ADT.

## 2023-06-24 NOTE — Progress Notes (Signed)
 RN spoke with [facility] and was able to move ADT appointment up to 6/3 at 8am.   RN reached out to patient, no answer, no voicemail.  Will continue to reach out to update patient regarding appointment.    Followed up with PCP referral, referral is still being reviewed.  Will continue to follow.

## 2023-06-25 NOTE — Progress Notes (Signed)
 Patient aware of appointment change for initiation of ADT on 6/3 with Dr. Inga Manges.

## 2023-06-29 DIAGNOSIS — C61 Malignant neoplasm of prostate: Secondary | ICD-10-CM | POA: Diagnosis not present

## 2023-06-29 NOTE — Progress Notes (Signed)
 Patient prescribed Orgovyx for initiation of his ADT today, 6/3.    Dr. Inga Manges will obtain cardiac clearance for his fiducial's, and spaceOAR gel placement.

## 2023-06-30 ENCOUNTER — Other Ambulatory Visit: Payer: Self-pay | Admitting: Urology

## 2023-06-30 DIAGNOSIS — C61 Malignant neoplasm of prostate: Secondary | ICD-10-CM

## 2023-07-13 NOTE — Progress Notes (Signed)
 RN contacted [facility] [place] to follow up on referral to get patient established with PCP.  They confirmed they received referral from 5/19, and will be reaching out to patient to schedule.    RN spoke with patient and provided update.  Patient reports no side effects at this time since start Orgovyx on 6/3.   RN will continue to follow.

## 2023-07-16 NOTE — Progress Notes (Signed)
 RN reached out to [employer] to initiate a new patient appointment on patients behalf as they have been unable to contact patient.   Patient is now scheduled for 09/07/23 Tuesday at 12pm with arrival time 11:45am.  RN notified schedulers that patient is interested in lung cancer screening program for annual, low-dose screening.  RN reached out to patient, unavailable at this time. RN will continue to reach out to inform of appointment.

## 2023-07-19 DIAGNOSIS — I129 Hypertensive chronic kidney disease with stage 1 through stage 4 chronic kidney disease, or unspecified chronic kidney disease: Secondary | ICD-10-CM | POA: Diagnosis not present

## 2023-07-19 DIAGNOSIS — F411 Generalized anxiety disorder: Secondary | ICD-10-CM | POA: Diagnosis not present

## 2023-07-19 DIAGNOSIS — N182 Chronic kidney disease, stage 2 (mild): Secondary | ICD-10-CM | POA: Diagnosis not present

## 2023-07-19 DIAGNOSIS — Z008 Encounter for other general examination: Secondary | ICD-10-CM | POA: Diagnosis not present

## 2023-07-19 DIAGNOSIS — I739 Peripheral vascular disease, unspecified: Secondary | ICD-10-CM | POA: Diagnosis not present

## 2023-07-19 DIAGNOSIS — C61 Malignant neoplasm of prostate: Secondary | ICD-10-CM | POA: Diagnosis not present

## 2023-07-19 DIAGNOSIS — E785 Hyperlipidemia, unspecified: Secondary | ICD-10-CM | POA: Diagnosis not present

## 2023-07-20 NOTE — Progress Notes (Signed)
 RN attempted to reach out to patient to review PCP appointment information.  No answer, no option to leave VM.  Will continue to reach out.

## 2023-07-22 NOTE — Progress Notes (Signed)
 RN spoke with patient and confirmed new appointment to get established with a PCP.  Patient requested information be mailed.  No additional needs at this time.

## 2023-08-17 ENCOUNTER — Other Ambulatory Visit (HOSPITAL_COMMUNITY): Payer: Self-pay

## 2023-08-17 DIAGNOSIS — R300 Dysuria: Secondary | ICD-10-CM | POA: Diagnosis not present

## 2023-08-17 DIAGNOSIS — C61 Malignant neoplasm of prostate: Secondary | ICD-10-CM | POA: Diagnosis not present

## 2023-08-17 MED ORDER — CIPROFLOXACIN HCL 500 MG PO TABS
500.0000 mg | ORAL_TABLET | Freq: Two times a day (BID) | ORAL | 0 refills | Status: DC
  Filled 2023-08-17 (×2): qty 14, 7d supply, fill #0

## 2023-08-17 NOTE — Progress Notes (Signed)
 Spoke with patient via telephone for preop interview. Patient stated he did not want to complete preop at this time because he has been unable to be seen by  cardiology for preop clearance. Pt stated he has appointment to be seen at [facility] today and would let them know he has not been able to see his cardiologist.

## 2023-08-18 ENCOUNTER — Other Ambulatory Visit (HOSPITAL_BASED_OUTPATIENT_CLINIC_OR_DEPARTMENT_OTHER): Payer: Self-pay

## 2023-08-19 NOTE — Progress Notes (Signed)
 Patient was scheduled for fiducial marker's and spaceOAR gel placement for 7/25, however, this has been cancelled due to patient not having appropriate cardiac clearance.   [facility] will work to obtain this.  Will continue to follow. RN spoke with patient and reviewed.

## 2023-08-20 ENCOUNTER — Ambulatory Visit (HOSPITAL_COMMUNITY): Admission: RE | Admit: 2023-08-20 | Source: Home / Self Care | Admitting: Urology

## 2023-08-20 SURGERY — INSERTION, GOLD SEEDS
Anesthesia: Monitor Anesthesia Care

## 2023-08-27 ENCOUNTER — Ambulatory Visit: Admitting: Radiation Oncology

## 2023-08-27 ENCOUNTER — Ambulatory Visit (HOSPITAL_COMMUNITY)

## 2023-09-02 NOTE — Progress Notes (Signed)
 Patient is now set up for cardiology appointment to review clearance for fiducial marker's and spaceOAR gel placement on 10/21/23.   RN contact patient, no answer, no voicemail.  Will continue to follow.

## 2023-09-07 ENCOUNTER — Ambulatory Visit: Admitting: Family

## 2023-09-07 NOTE — Progress Notes (Signed)
 RN spoke with patient, unfortunately he was not able to make his appointment to establish care with a PCP that was scheduled for today, 8/12.   RN was able to coordinate for new appointment on 9/25.   RN informed patient of upcoming cardiologist appointment as well.  Reminders will be mailed to patient, and I would follow up closer to dates as well as a reminder.

## 2023-10-18 NOTE — Progress Notes (Signed)
 RN spoke with patient to remind him of upcoming cardiology appointment.  RN confirmed time, date, and location.

## 2023-10-18 NOTE — Progress Notes (Addendum)
 Cardiology Office Note Date:  10/21/2023  ID:  JACOBE STUDY, DOB Oct 20, 1955, MRN [id number] PCP:  Pcp, No  Cardiologist: Joelle VEAR Ren Donley, MD  Chief Complaint  Patient presents with   Pre-op Exam      Problems Pre op for space OAR gel placement PAD R SFA stent occluded 05/2016 R fem-popliteal bypass 06/2016 CAD MPI 06/2016- Large size, moderate severity fixed (SDS 1) inferior and inferoseptal perfusion defect, likely scar or possibly artifact. LVEF 51% with inferoseptal hypokinesis. CVA Tobacco use 40-pack year Prostate adenocarcinoma  Visits  09/2023: TTE, LP, A1C, ABI, d/c plavix , aspirin , low dose riva, follow up in 3 months    History of Present Illness: Roy Moore is a 68 y.o. male who presents for pre-op eval.  He denies any CP with exertion. He has some arthritis that makes it hard for him to be active. He denies any dyspnea on exertion and can walk up a flight of stairs. He reports occasional wheezing and had been told in the past that he may have COPD. He denies orthopnea, PND, He continues to smoke 0.5-1PPD currently.   ROS: Please see the history of present illness. All other systems are reviewed and negative.   Past Medical History:  Diagnosis Date   Anxiety    Arthritis    all joints   Atherosclerosis of native artery of right lower extremity with intermittent claudication 05/2016   (01-30-202 pt stated pain in leg/ feet when walking long time r>l) previouly followed by vascular--- dr serene (lov note in epic 11-23-2016, not followed since);   06-09-2016 s/p right SFA endovascular stenting for occlusion;  reoccluded 07-24-2016 s/p right fem-pop above knee artery bypass graft w/ ipsilateral non-reversed GSV,  at last visit ABI w/ elevated velocities at the distal anastomosis   Benign localized prostatic hyperplasia with lower urinary tract symptoms (LUTS)    Bladder stones    Carotid stenosis, symptomatic, with infarction [facility])    history CVA  08/ 2009  d/ t acute right carotid ICA occlusion   CKD (chronic kidney disease), stage III (HCC)    Depression    Elevated PSA    Environmental and seasonal allergies    H/O urinary retention 12/2022   History of cerebrovascular accident (CVA) with residual deficit 09/15/2007   (02-25-2023 pt stated only residual Left arm weaker that right but not profound) admission in epic;   right MCA watershed infarcts w/ remote left PCA watershed infarcts;  treated w/ TPA in ED ,  suspected due to acute right occluded ICA and diagnosis w/ bilateral occlusion ICA @ the skull base with bilateral collateral reconstitution   History of kidney stones    Hyperlipidemia    Hypertension    Neuropathy    bilateral foot intermitent numbness/ pain   PAD (peripheral artery disease)    Smokers' cough (HCC)    pt stated is productive in morning w/ clear phlegm   Stroke (cerebrum) (HCC)    Wears glasses     Past Surgical History:  Procedure Laterality Date   ABDOMINAL AORTOGRAM W/LOWER EXTREMITY [name] 06/09/2016   Procedure: Abdominal Aortogram w/Lower Extremity;  Surgeon: Serene Gaile ORN, MD;  Location: [facility];  Service: Cardiovascular;  Laterality: [name];   ANTERIOR CERVICAL DECOMP/DISCECTOMY FUSION  10/23/2003   @[facility]  by dr h. pool;   C3---C7   COLONOSCOPY WITH PROPOFOL   12/10/2016   dr stark   CYSTOSCOPY WITH LITHOLAPAXY [name] 02/26/2023   Procedure: CYSTOSCOPY WITH REMOVAL  OF BLADDER STONES;  Surgeon: Watt Rush, MD;  [facility];  Service: Urology;  Laterality: [name];   CYSTOSCOPY/URETEROSCOPY/HOLMIUM LASER/STENT PLACEMENT Left 04/08/2001   @[facility]  by dr matilda:   FEMORAL-POPLITEAL BYPASS GRAFT Right 07/24/2016   Procedure: RIGHT FEMORAL TO ABOVE KNEE POPLITEAL ARTERY BYPASS GRAFT;  Surgeon: Serene Gaile ORN, MD;  Location: [facility] OR;  Service: Vascular;  Laterality: Right;   KNEE ARTHROSCOPY Left 1990   LUMBAR LAMINECTOMY/DECOMPRESSION MICRODISCECTOMY  08/04/1999   @[facility]  by  dr h. pool;   L3--4   LUMBAR LAMINECTOMY/DECOMPRESSION MICRODISCECTOMY Bilateral 12/03/2015   Procedure: Microdiscectomy - bilateral - Lumbar four-lumbar five;  Surgeon: Victory Gunnels, MD;  Location: [facility] OR;  Service: Neurosurgery;  Laterality: Bilateral;   MANDIBLE FRACTURE SURGERY     yrs ago w/ wires;    then  laser hardware removal   PERIPHERAL VASCULAR INTERVENTION Right 06/09/2016   Procedure: Peripheral Vascular Intervention;  Surgeon: Serene Gaile ORN, MD;  Location: [facility];  Service: Cardiovascular;  Laterality: Right;  SFA   PROSTATE BIOPSY [name] 02/26/2023   Procedure: BIOPSY TRANSRECTAL ULTRASONIC PROSTATE (TUBP);  Surgeon: Watt Rush, MD;  Location: [facility];  Service: Urology;  Laterality: [name];   REPAIR EXTENSOR TENDON Left 10/12/2018   Procedure: TENDON SHEATH RELEASE/TENOLYSIS;  Surgeon: Jerri Kay HERO, MD;  Location: [facility];  Service: Orthopedics;  Laterality: Left;   TRANSURETHRAL INCISION OF PROSTATE [name] 02/26/2023   Procedure: TRANSURETHRAL INCISION OF THE PROSTATE (TUIP);  Surgeon: Watt Rush, MD;  Location: [facility];  Service: Urology;  Laterality: [name];    Current Outpatient Medications  Medication Sig Dispense Refill   amLODipine  (NORVASC ) 10 MG tablet Take 1 tablet (10 mg total) by mouth daily. (Patient taking differently: Take 10 mg by mouth daily.) 30 tablet 1   atorvastatin  (LIPITOR) 20 MG tablet Take 1 tablet (20 mg total) by mouth daily. (Patient taking differently: Take 20 mg by mouth daily.) 90 tablet 0   ciprofloxacin  (CIPRO ) 500 MG tablet Take 1 tablet (500 mg total) by mouth 2 (two) times daily. 14 tablet 0   escitalopram (LEXAPRO) 10 MG tablet Take 10 mg by mouth at bedtime.     loratadine  (CLARITIN ) 10 MG tablet Take 1 tablet (10 mg total) by mouth daily. (Patient taking differently: Take 10 mg by mouth daily as needed for allergies.) 100 tablet 11   tamsulosin  (FLOMAX ) 0.4 MG CAPS capsule Take 1  capsule (0.4 mg total) by mouth daily. 30 capsule 11   No current facility-administered medications for this visit.    Allergies:   Elavil  [amitriptyline ]   Social History:  40-pack smoking history  Family History:  Noncontributory  PHYSICAL EXAM: VS:  BP (!) 116/56 (BP Location: Left Arm, Patient Position: Sitting, Cuff Size: Normal)   Pulse 86   Ht 5' 9 (1.753 m)   Wt 125 lb (56.7 kg)   SpO2 96%   BMI 18.46 kg/m  , BMI Body mass index is 18.46 kg/m. GEN: Well nourished, well developed, in no acute distress HEENT: normal Neck: no JVD, carotid bruits, or masses Cardiac: RRR; no murmurs, rubs, or gallops,no edema  Respiratory:  CTAB bilaterally, normal work of breathing GI: soft, nontender, nondistended, + BS Extremities: No LE edema Skin: warm and dry, no rash Neuro:  Strength and sensation are intact  EKG: NSR  Recent Labs: Reviewed  Studies: Reviewed  ASSESSMENT AND PLAN: Roy Moore is a 68 y.o. male who presents for pre-op  eval.  #Preop for space OAR gel placement #CAD #PAD s/p fem bypass/R SFA endovascular #45 pack-year tobacco use - He presenting for pre-op eval for gel placement. He had no significant Hx of heart disease but has multiple risk factors and has had CVA and fem bypass. He denies any symptoms and reports that he can walk up a flight of stairs. Will obtain TTE for further evaluation. - Will obtain LP, A1C for prevention optimization - Will check ABI given Hx of fem bypass - Will d/c plavix  and start ASA 81 mg and low dose Riva 2.5 mg BID - Will follow up in 3 months for smoking counseling, optimizing lipids, adding ACE/ARB, and lung cancer/AAA screening  12/03/23 Addendum - 2D echo results with normal biventricular function.  No further evaluation needed for preop standpoint.  Patient can proceed to surgery.  Signed, Joelle VEAR Ren Donley, MD  10/21/2023 2:30 PM    [facility]

## 2023-10-20 ENCOUNTER — Telehealth: Payer: Self-pay | Admitting: *Deleted

## 2023-10-20 NOTE — Telephone Encounter (Signed)
 Patient has an upcoming new patient appt clearance can be addressed at office visit made a note on appointment line that clearance is needed

## 2023-10-20 NOTE — Telephone Encounter (Signed)
   Pre-operative Risk Assessment    Patient Name: Roy Moore  DOB: October 25, 1955 MRN: [id number]   Date of last office visit: 07/17/16 DR. BERRY Date of next office visit: 10/21/23 DR. AZOBOU-TONLEU-NEW PT APPT    Request for Surgical Clearance    Procedure:  FIDUCIAL MARKER/OAR  Date of Surgery:  Clearance TBD ; PROCEDURE WAS ORIGINALLY PLANNED FOR 08/20/23                               Surgeon:  DR. NORLEEN SELTZER Surgeon's Group or Practice Name:  [facility] Phone number:  [phone] Fax number:  [phone]   Type of Clearance Requested:   - Medical  - Pharmacy:  Hold Clopidogrel  (Plavix )     Type of Anesthesia:  MAC   Additional requests/questions:    Roy Moore   10/20/2023, 9:40 AM

## 2023-10-20 NOTE — Telephone Encounter (Signed)
   Name: Roy Moore  DOB: [birth year]  MRN: [id number]  Primary Cardiologist: None  Chart reviewed as part of pre-operative protocol coverage. The patient has an upcoming visit scheduled with Dr. DENEISE  on 10/21/2023 at which time clearance can be addressed in case there are any issues that would impact surgical recommendations.  FIDUCIAL MARKER/OAR Is not scheduled until TBD as below. I added preop FYI to appointment note so that provider is aware to address at time of outpatient visit.  Per office protocol the cardiology provider should forward their finalized clearance decision and recommendations regarding antiplatelet therapy to the requesting party below.    This message will also be routed to  Dr DENEISE  for input on holding Plavix  as requested below so that this information is available to the clearing provider at time of patient's appointment.   I will route this message as FYI to requesting party and remove this message from the preop box as separate preop APP input not needed at this time.   Please call with any questions.  Lamarr Satterfield, NP  10/20/2023, 9:46 AM

## 2023-10-21 ENCOUNTER — Other Ambulatory Visit (HOSPITAL_COMMUNITY): Payer: Self-pay

## 2023-10-21 ENCOUNTER — Ambulatory Visit

## 2023-10-21 VITALS — BP 116/56 | HR 86 | Ht 69.0 in | Wt 125.0 lb

## 2023-10-21 DIAGNOSIS — I10 Essential (primary) hypertension: Secondary | ICD-10-CM

## 2023-10-21 DIAGNOSIS — F17200 Nicotine dependence, unspecified, uncomplicated: Secondary | ICD-10-CM | POA: Diagnosis not present

## 2023-10-21 DIAGNOSIS — E785 Hyperlipidemia, unspecified: Secondary | ICD-10-CM

## 2023-10-21 DIAGNOSIS — I69359 Hemiplegia and hemiparesis following cerebral infarction affecting unspecified side: Secondary | ICD-10-CM | POA: Diagnosis not present

## 2023-10-21 DIAGNOSIS — C61 Malignant neoplasm of prostate: Secondary | ICD-10-CM | POA: Diagnosis not present

## 2023-10-21 DIAGNOSIS — Z01818 Encounter for other preprocedural examination: Secondary | ICD-10-CM | POA: Diagnosis not present

## 2023-10-21 DIAGNOSIS — I739 Peripheral vascular disease, unspecified: Secondary | ICD-10-CM | POA: Diagnosis not present

## 2023-10-21 LAB — LIPID PANEL

## 2023-10-21 MED ORDER — RIVAROXABAN 2.5 MG PO TABS
2.5000 mg | ORAL_TABLET | Freq: Two times a day (BID) | ORAL | 0 refills | Status: DC
  Filled 2023-10-21: qty 180, 90d supply, fill #0

## 2023-10-21 MED ORDER — ASPIRIN 81 MG PO TBEC: 81.0000 mg | DELAYED_RELEASE_TABLET | Freq: Every day | ORAL | Status: DC

## 2023-10-21 NOTE — Patient Instructions (Addendum)
 Medication Instructions:  Your physician has recommended you make the following change in your medication:   1) STOP clopidogrel  (Plavix ) 2) START aspirin  81 mg daily 3) START rivaroxaban  (Xarelto ) 2.5 mg twice daily  *If you need a refill on your cardiac medications before your next appointment, please call your pharmacy*  Lab Work: TODAY: Lipid panel, hemoglobin A1c If you have labs (blood work) drawn today and your tests are completely normal, you will receive your results only by: MyChart Message (if you have MyChart) OR A paper copy in the mail If you have any lab test that is abnormal or we need to change your treatment, we will call you to review the results.  Testing/Procedures: Your physician has requested that you have an echocardiogram. Echocardiography is a painless test that uses sound waves to create images of your heart. It provides your doctor with information about the size and shape of your heart and how well your heart's chambers and valves are working. This procedure takes approximately one hour. There are no restrictions for this procedure. Please do NOT wear cologne, perfume, aftershave, or lotions (deodorant is allowed). Please arrive 15 minutes prior to your appointment time.  Please note: We ask at that you not bring children with you during ultrasound (echo/ vascular) testing. Due to room size and safety concerns, children are not allowed in the ultrasound rooms during exams. Our front office staff cannot provide observation of children in our lobby area while testing is being conducted. An adult accompanying a patient to their appointment will only be allowed in the ultrasound room at the discretion of the ultrasound technician under special circumstances. We apologize for any inconvenience.   Your physician has requested that you have an ankle brachial index (ABI). During this test an ultrasound and blood pressure cuff are used to evaluate the arteries that supply  the arms and legs with blood. Allow thirty minutes for this exam. There are no restrictions or special instructions.  Please note: We ask at that you not bring children with you during ultrasound (echo/ vascular) testing. Due to room size and safety concerns, children are not allowed in the ultrasound rooms during exams. Our front office staff cannot provide observation of children in our lobby area while testing is being conducted. An adult accompanying a patient to their appointment will only be allowed in the ultrasound room at the discretion of the ultrasound technician under special circumstances. We apologize for any inconvenience.   Your physician has requested that you have a lower extremity arterial duplex. This test is an ultrasound of the arteries in the legs or arms. It looks at arterial blood flow in the legs and arms. Allow one hour for Lower and Upper Arterial scans. There are no restrictions or special instructions.  Please note: We ask at that you not bring children with you during ultrasound (echo/ vascular) testing. Due to room size and safety concerns, children are not allowed in the ultrasound rooms during exams. Our front office staff cannot provide observation of children in our lobby area while testing is being conducted. An adult accompanying a patient to their appointment will only be allowed in the ultrasound room at the discretion of the ultrasound technician under special circumstances. We apologize for any inconvenience.   Follow-Up: At [facility], you and your health needs are our priority.  As part of our continuing mission to provide you with exceptional heart care, our providers are all part of one team.  This team  includes your primary Cardiologist (physician) and Advanced Practice Providers or APPs (Physician Assistants and Nurse Practitioners) who all work together to provide you with the care you need, when you need it.  Your next appointment:   3  month(s)  Provider:   Joelle VEAR Ren Donley, MD   We recommend signing up for the patient portal called MyChart.  Sign up information is provided on this After Visit Summary.  MyChart is used to connect with patients for Virtual Visits (Telemedicine).  Patients are able to view lab/test results, encounter notes, upcoming appointments, etc.  Non-urgent messages can be sent to your provider as well.    To learn more about what you can do with MyChart, go to [email or web address].   Other Instructions

## 2023-10-22 ENCOUNTER — Other Ambulatory Visit (HOSPITAL_COMMUNITY): Payer: Self-pay

## 2023-10-22 ENCOUNTER — Ambulatory Visit: Payer: Self-pay

## 2023-10-22 LAB — HEMOGLOBIN A1C
Est. average glucose Bld gHb Est-mCnc: 111 mg/dL
Hgb A1c MFr Bld: 5.5 % (ref 4.8–5.6)

## 2023-10-22 LAB — LIPID PANEL
Cholesterol, Total: 169 mg/dL (ref 100–199)
HDL: 45 mg/dL (ref >39)
LDL CALC COMMENT:: 3.8 ratio (ref 0.0–5.0)
LDL Chol Calc (NIH): 104 mg/dL — AB (ref 0–99)
Triglycerides: 108 mg/dL (ref 0–149)
VLDL Cholesterol Cal: 20 mg/dL (ref 5–40)

## 2023-10-22 NOTE — Progress Notes (Signed)
 Called patient no answer could not leave a vm due to vm  Print and Mailed out lipid results.

## 2023-10-25 NOTE — Progress Notes (Signed)
 Patient recently saw cardiologist for clearance for fiducial marker's and spaceOAR gel placement. Patient will have echocardiogram on 10/21.

## 2023-10-27 ENCOUNTER — Ambulatory Visit: Admitting: Family

## 2023-11-01 DIAGNOSIS — C61 Malignant neoplasm of prostate: Secondary | ICD-10-CM | POA: Diagnosis not present

## 2023-11-02 ENCOUNTER — Other Ambulatory Visit (HOSPITAL_COMMUNITY): Payer: Self-pay

## 2023-11-16 ENCOUNTER — Encounter (HOSPITAL_COMMUNITY): Payer: Self-pay

## 2023-11-16 ENCOUNTER — Ambulatory Visit (HOSPITAL_COMMUNITY): Admission: RE | Admit: 2023-11-16 | Source: Ambulatory Visit

## 2023-11-22 NOTE — Progress Notes (Signed)
 Patient was scheduled for an echocardiogram on 10/21 but no showed.  RN spoke with patient and patient reports having a fall several days prior and didn't feel well enough to go.  RN educated on importance of this for cardiac clearance to proceed with treatment for his prostate cancer.  RN reached out to cardiology office and they will be in contact with patient to reschedule.

## 2023-11-23 ENCOUNTER — Other Ambulatory Visit: Payer: Self-pay

## 2023-11-23 DIAGNOSIS — Z01818 Encounter for other preprocedural examination: Secondary | ICD-10-CM

## 2023-11-23 NOTE — Progress Notes (Signed)
 Patient no-showed echo appt on 10/21--new echo order needed to reschedule.

## 2023-11-25 NOTE — Progress Notes (Signed)
 Patient was rescheduled for echocardiogram for 12/28/23.  RN reached out to [facility] and was able to get echocardiogram moved up to 12/02/2023 with arrival time of 12:35.   RN reached out to patient, no answer.  Will follow up again to review new appointment time.

## 2023-11-26 ENCOUNTER — Ambulatory Visit (HOSPITAL_COMMUNITY)
Admission: RE | Admit: 2023-11-26 | Discharge: 2023-11-26 | Disposition: A | Discharge status: Home / Self Care | Source: Ambulatory Visit

## 2023-11-26 ENCOUNTER — Ambulatory Visit (HOSPITAL_BASED_OUTPATIENT_CLINIC_OR_DEPARTMENT_OTHER)
Admission: RE | Admit: 2023-11-26 | Discharge: 2023-11-26 | Disposition: A | Discharge status: Home / Self Care | Source: Ambulatory Visit

## 2023-11-26 DIAGNOSIS — I739 Peripheral vascular disease, unspecified: Secondary | ICD-10-CM

## 2023-11-26 LAB — VAS US ABI WITH/WO TBI
Left ABI: 0.51
Right ABI: 0.96

## 2023-11-26 NOTE — Progress Notes (Signed)
 RN spoke with patient and confirmed he is aware of upcoming echocardiogram appointment.

## 2023-11-30 NOTE — Progress Notes (Addendum)
 RN reached out patient for reminder of upcoming echocardiogram.  No answer, no option to leave message.  RN will reach out later today to ensure reminder.   RN was able to speak with patient and remind him of echocardiogram on Thursday 11/6.

## 2023-12-02 ENCOUNTER — Ambulatory Visit (HOSPITAL_COMMUNITY)
Admission: RE | Admit: 2023-12-02 | Discharge: 2023-12-02 | Disposition: A | Discharge status: Home / Self Care | Source: Ambulatory Visit | Attending: Cardiovascular Disease | Admitting: Cardiovascular Disease

## 2023-12-02 DIAGNOSIS — I10 Essential (primary) hypertension: Secondary | ICD-10-CM | POA: Diagnosis not present

## 2023-12-02 DIAGNOSIS — Z01818 Encounter for other preprocedural examination: Secondary | ICD-10-CM | POA: Diagnosis present

## 2023-12-02 DIAGNOSIS — E785 Hyperlipidemia, unspecified: Secondary | ICD-10-CM | POA: Diagnosis not present

## 2023-12-02 DIAGNOSIS — Z01810 Encounter for preprocedural cardiovascular examination: Secondary | ICD-10-CM | POA: Insufficient documentation

## 2023-12-02 DIAGNOSIS — Z8673 Personal history of transient ischemic attack (TIA), and cerebral infarction without residual deficits: Secondary | ICD-10-CM | POA: Insufficient documentation

## 2023-12-02 DIAGNOSIS — F17210 Nicotine dependence, cigarettes, uncomplicated: Secondary | ICD-10-CM | POA: Insufficient documentation

## 2023-12-02 DIAGNOSIS — I739 Peripheral vascular disease, unspecified: Secondary | ICD-10-CM | POA: Insufficient documentation

## 2023-12-02 LAB — ECHOCARDIOGRAM COMPLETE
Area-P 1/2: 4.54 cm2
S' Lateral: 3 cm

## 2023-12-03 ENCOUNTER — Ambulatory Visit: Payer: Self-pay

## 2023-12-03 DIAGNOSIS — I739 Peripheral vascular disease, unspecified: Secondary | ICD-10-CM

## 2023-12-06 NOTE — Progress Notes (Signed)
 Patient had his echocardiogram, and per cardiologist no further evaluation needed for preop standpoint.  Patient can proceed to surgery.   RN notified MD's.  Will plan for patient to get back on schedule for fiducial marker's and spaceOAR gel so he can proceed with daily radiation.

## 2023-12-09 ENCOUNTER — Encounter: Payer: Self-pay | Admitting: *Deleted

## 2023-12-17 ENCOUNTER — Emergency Department (HOSPITAL_COMMUNITY)
Admission: EM | Admit: 2023-12-17 | Discharge: 2023-12-17 | Discharge status: Against Medical Advice | Attending: Emergency Medicine | Admitting: Emergency Medicine

## 2023-12-17 ENCOUNTER — Emergency Department (HOSPITAL_COMMUNITY)

## 2023-12-17 ENCOUNTER — Other Ambulatory Visit: Payer: Self-pay

## 2023-12-17 DIAGNOSIS — Z8546 Personal history of malignant neoplasm of prostate: Secondary | ICD-10-CM | POA: Insufficient documentation

## 2023-12-17 DIAGNOSIS — Z79899 Other long term (current) drug therapy: Secondary | ICD-10-CM | POA: Insufficient documentation

## 2023-12-17 DIAGNOSIS — I10 Essential (primary) hypertension: Secondary | ICD-10-CM | POA: Insufficient documentation

## 2023-12-17 DIAGNOSIS — R0602 Shortness of breath: Secondary | ICD-10-CM

## 2023-12-17 DIAGNOSIS — J441 Chronic obstructive pulmonary disease with (acute) exacerbation: Secondary | ICD-10-CM | POA: Diagnosis not present

## 2023-12-17 DIAGNOSIS — R7989 Other specified abnormal findings of blood chemistry: Secondary | ICD-10-CM | POA: Diagnosis not present

## 2023-12-17 DIAGNOSIS — Z5329 Procedure and treatment not carried out because of patient's decision for other reasons: Secondary | ICD-10-CM | POA: Diagnosis not present

## 2023-12-17 DIAGNOSIS — Z7982 Long term (current) use of aspirin: Secondary | ICD-10-CM | POA: Insufficient documentation

## 2023-12-17 DIAGNOSIS — D649 Anemia, unspecified: Secondary | ICD-10-CM | POA: Insufficient documentation

## 2023-12-17 DIAGNOSIS — Z7901 Long term (current) use of anticoagulants: Secondary | ICD-10-CM | POA: Insufficient documentation

## 2023-12-17 DIAGNOSIS — Z95820 Peripheral vascular angioplasty status with implants and grafts: Secondary | ICD-10-CM | POA: Insufficient documentation

## 2023-12-17 LAB — CBC WITH DIFFERENTIAL/PLATELET
Abs Immature Granulocytes: 0.05 K/uL (ref 0.00–0.07)
Basophils Absolute: 0.1 K/uL (ref 0.0–0.1)
Basophils Relative: 1 %
Eosinophils Absolute: 0.5 K/uL (ref 0.0–0.5)
Eosinophils Relative: 6 %
HCT: 36.8 % — ABNORMAL LOW (ref 39.0–52.0)
Hemoglobin: 11.9 g/dL — ABNORMAL LOW (ref 13.0–17.0)
Immature Granulocytes: 1 %
Lymphocytes Relative: 16 %
Lymphs Abs: 1.4 K/uL (ref 0.7–4.0)
MCH: 28.5 pg (ref 26.0–34.0)
MCHC: 32.3 g/dL (ref 30.0–36.0)
MCV: 88.2 fL (ref 80.0–100.0)
Monocytes Absolute: 0.6 K/uL (ref 0.1–1.0)
Monocytes Relative: 7 %
Neutro Abs: 6.4 K/uL (ref 1.7–7.7)
Neutrophils Relative %: 69 %
Platelets: 293 K/uL (ref 150–400)
RBC: 4.17 MIL/uL — ABNORMAL LOW (ref 4.22–5.81)
RDW: 14.2 % (ref 11.5–15.5)
WBC: 9 K/uL (ref 4.0–10.5)
nRBC: 0 % (ref 0.0–0.2)

## 2023-12-17 LAB — BASIC METABOLIC PANEL WITH GFR
Anion gap: 11 (ref 5–15)
BUN: 20 mg/dL (ref 8–23)
CO2: 23 mmol/L (ref 22–32)
Calcium: 9.5 mg/dL (ref 8.9–10.3)
Chloride: 104 mmol/L (ref 98–111)
Creatinine, Ser: 1.18 mg/dL (ref 0.61–1.24)
GFR, Estimated: >60 mL/min (ref >60)
Glucose, Bld: 97 mg/dL (ref 70–99)
Potassium: 4.2 mmol/L (ref 3.5–5.1)
Sodium: 137 mmol/L (ref 135–145)

## 2023-12-17 LAB — RESP PANEL BY RT-PCR (RSV, FLU A&B, COVID)  RVPGX2
Influenza A by PCR: NEGATIVE
Influenza B by PCR: NEGATIVE
Resp Syncytial Virus by PCR: NEGATIVE
SARS Coronavirus 2 by RT PCR: NEGATIVE

## 2023-12-17 LAB — PRO BRAIN NATRIURETIC PEPTIDE: Pro Brain Natriuretic Peptide: 2532 pg/mL — ABNORMAL HIGH (ref <300.0)

## 2023-12-17 LAB — TROPONIN T, HIGH SENSITIVITY: Troponin T High Sensitivity: 42 ng/L — ABNORMAL HIGH (ref 0–19)

## 2023-12-17 MED ORDER — DOXYCYCLINE HYCLATE 100 MG PO CAPS: 100.0000 mg | ORAL_CAPSULE | Freq: Two times a day (BID) | ORAL | 0 refills | Status: DC

## 2023-12-17 MED ORDER — PREDNISONE 20 MG PO TABS: 40.0000 mg | ORAL_TABLET | Freq: Every day | ORAL | 0 refills | Status: DC

## 2023-12-17 MED ORDER — IOHEXOL 350 MG/ML SOLN: 75.0000 mL | Freq: Once | INTRAVENOUS | Status: DC | PRN

## 2023-12-17 MED ORDER — IPRATROPIUM BROMIDE 0.02 % IN SOLN
0.5000 mg | Freq: Once | RESPIRATORY_TRACT | Status: AC
  Administered 2023-12-17: 0.5 mg via RESPIRATORY_TRACT
  Filled 2023-12-17: qty 2.5

## 2023-12-17 MED ORDER — ALBUTEROL SULFATE (2.5 MG/3ML) 0.083% IN NEBU
5.0000 mg | INHALATION_SOLUTION | Freq: Once | RESPIRATORY_TRACT | Status: AC
  Administered 2023-12-17: 5 mg via RESPIRATORY_TRACT
  Filled 2023-12-17: qty 6

## 2023-12-17 MED ORDER — ALBUTEROL SULFATE HFA 108 (90 BASE) MCG/ACT IN AERS
2.0000 | INHALATION_SPRAY | Freq: Once | RESPIRATORY_TRACT | Status: AC
  Administered 2023-12-17: 2 via RESPIRATORY_TRACT
  Filled 2023-12-17: qty 6.7

## 2023-12-17 MED ORDER — PREDNISONE 20 MG PO TABS
40.0000 mg | ORAL_TABLET | Freq: Once | ORAL | Status: AC
  Administered 2023-12-17: 40 mg via ORAL
  Filled 2023-12-17: qty 2

## 2023-12-17 NOTE — Discharge Instructions (Signed)
 You were seen for shortness of breath today.  As part of your workup, we noted that your troponin was elevated.  This is a cardiac enzyme test which tells me that your heart is under stress.  This could be from something like a heart attack or blood clot.  Your BNP, a screening test for heart failure, was also high.  I recommended a CT scan of the chest to check for a blood clot and additional blood testing to check for heart attack.  You have stated that you do not want further testing and would like to go home at this time.  Please be aware that blood clots and heart attacks are extremely dangerous and can cause death.  You are more than welcome to return if you feel worse or if you change your mind about further testing.  Please return with worsening chest pain, worsening shortness of breath, if you pass out, develop swelling or other concerning symptoms.  Please follow-up with your doctor soon as you possibly can.  In case your symptoms are related to COPD, I have prescribed prednisone , albuterol  inhaler, and doxycycline .  Please use the albuterol  inhaler 2 puffs every 4 hours for shortness of breath and wheezing.

## 2023-12-17 NOTE — ED Notes (Signed)
 Pt requested IV be removed, pt aware it may have to be placed back in a different location. Verbalizes understanding.

## 2023-12-17 NOTE — ED Provider Notes (Signed)
 [place] EMERGENCY DEPARTMENT AT [facility] Provider Note   CSN: [id number] Arrival date & time: 12/17/23  1438     Patient presents with: Shortness of Breath   Roy Moore is a 68 y.o. male.   Patient with h/o hypertension, hyperlipidemia, history of right-sided femoral bypass, currently on Xarelto , prostate cancer, awaiting procedure with urology --presents to the emergency department today for evaluation of cough and shortness of breath.  Symptoms started about 3 days ago.  Shortness of breath is episodic.  Patient has had some runny nose and nasal congestion.  He has had chills, no documented fevers.  No new left lower extremity swelling however he has felt some bumps in the right groin, first noticed today.  No abdominal pain.  He does report wheezing at home.  Question of COPD history.  Normal echocardiogram 12/02/23.  Cleared for surgery by cardiology.  ABI 10/31: Referred back to vascular surgery.  Right: Resting right ankle-brachial index is within normal range. The  right toe-brachial index is abnormal.  Right toe pressure is >60 mmHg which suggests adequate perfusion for  healing.  Left: Resting left ankle-brachial index indicates moderate left lower  extremity arterial disease. The left toe-brachial index is abnormal.           Prior to Admission medications   Medication Sig Start Date End Date Taking? Authorizing Provider  amLODipine  (NORVASC ) 10 MG tablet Take 1 tablet (10 mg total) by mouth daily. Patient taking differently: Take 10 mg by mouth daily. 03/26/22   Vonna Sharlet POUR, MD  aspirin  EC 81 MG tablet Take 1 tablet (81 mg total) by mouth daily. Swallow whole. 10/21/23   Azobou Donley Joelle DEL, MD  atorvastatin  (LIPITOR) 20 MG tablet Take 1 tablet (20 mg total) by mouth daily. Patient taking differently: Take 20 mg by mouth daily. 06/08/22   Dameron, Marisa, DO  ciprofloxacin  (CIPRO ) 500 MG tablet Take 1 tablet (500 mg total) by mouth 2  (two) times daily. 08/17/23     escitalopram (LEXAPRO) 10 MG tablet Take 10 mg by mouth at bedtime. 04/09/23   [provider]  loratadine  (CLARITIN ) 10 MG tablet Take 1 tablet (10 mg total) by mouth daily. Patient taking differently: Take 10 mg by mouth daily as needed for allergies. 06/08/22   Howell Lunger, DO  rivaroxaban  (XARELTO ) 2.5 MG TABS tablet Take 1 tablet (2.5 mg total) by mouth 2 (two) times daily. 10/21/23   Azobou Donley Joelle DEL, MD  tamsulosin  (FLOMAX ) 0.4 MG CAPS capsule Take 1 capsule (0.4 mg total) by mouth daily. 02/01/23     amitriptyline  (ELAVIL ) 50 MG tablet Take 1 tablet (50 mg total) by mouth at bedtime. 11/14/18 04/26/20  Levora Reyes SAUNDERS, MD  ramipril  (ALTACE ) 2.5 MG capsule Take 1 capsule (2.5 mg total) by mouth daily. 03/13/19 04/26/20  Levora Reyes SAUNDERS, MD    Allergies: Elavil  [amitriptyline ]    Review of Systems  Updated Vital Signs BP (!) 138/58 (BP Location: Left Arm)   Pulse 77   Temp 98.5 F (36.9 C) (Oral)   Resp 16   SpO2 100%   Physical Exam Vitals and nursing note reviewed.  Constitutional:      Appearance: He is well-developed and underweight. He is not diaphoretic.  HENT:     Head: Normocephalic and atraumatic.     Mouth/Throat:     Mouth: Mucous membranes are not dry.  Eyes:     Conjunctiva/sclera: Conjunctivae normal.  Neck:  Vascular: Normal carotid pulses. No carotid bruit or JVD.     Trachea: Trachea normal. No tracheal deviation.  Cardiovascular:     Rate and Rhythm: Normal rate and regular rhythm.     Pulses: No decreased pulses.          Radial pulses are 2+ on the right side and 2+ on the left side.       Femoral pulses are 3+ on the right side and 2+ on the left side.      Dorsalis pedis pulses are 2+ on the right side.     Heart sounds: Normal heart sounds, S1 normal and S2 normal. Heart sounds not distant. No murmur heard. Pulmonary:     Effort: Pulmonary effort is normal. No respiratory distress.     Breath  sounds: Normal breath sounds. No wheezing, rhonchi or rales.     Comments: At time of exam, lungs are clear.  Occasional cough during exam. Chest:     Chest wall: No tenderness.  Abdominal:     General: Bowel sounds are normal.     Palpations: Abdomen is soft.     Tenderness: There is no abdominal tenderness. There is no guarding or rebound.  Musculoskeletal:     Cervical back: Normal range of motion and neck supple. No muscular tenderness.     Right lower leg: No tenderness. No edema.     Left lower leg: No tenderness. No edema.  Skin:    General: Skin is warm and dry.     Coloration: Skin is not pale.  Neurological:     Mental Status: He is alert. Mental status is at baseline.  Psychiatric:        Mood and Affect: Mood normal.     (all labs ordered are listed, but only abnormal results are displayed) Labs Reviewed  CBC WITH DIFFERENTIAL/PLATELET - Abnormal; Notable for the following components:      Result Value   RBC 4.17 (*)    Hemoglobin 11.9 (*)    HCT 36.8 (*)    All other components within normal limits  PRO BRAIN NATRIURETIC PEPTIDE - Abnormal; Notable for the following components:   Pro Brain Natriuretic Peptide 2,532.0 (*)    All other components within normal limits  TROPONIN T, HIGH SENSITIVITY - Abnormal; Notable for the following components:   Troponin T High Sensitivity 42 (*)    All other components within normal limits  RESP PANEL BY RT-PCR (RSV, FLU A&B, COVID)  [id number]  BASIC METABOLIC PANEL WITH GFR    ED ECG REPORT   Date: 12/17/2023  Rate: 80  Rhythm: normal sinus rhythm  QRS Axis: normal  Intervals: normal  ST/T Wave abnormalities: nonspecific ST/T changes  Conduction Disutrbances:none  Narrative Interpretation:   Old EKG Reviewed: unchanged from 10/21/23  I have personally reviewed the EKG tracing and agree with the computerized printout as noted.   Radiology: DG Chest 2 View Result Date: 12/17/2023 CLINICAL DATA:  Cough, short of  breath, dyspnea on exertion EXAM: CHEST - 2 VIEW COMPARISON:  06/07/2022 FINDINGS: Frontal and lateral views of the chest are obtained on 4 images. The cardiac silhouette is unremarkable. Lungs are hyperexpanded, with no acute airspace disease, effusion, or pneumothorax. No acute bony abnormalities. IMPRESSION: 1. No acute intrathoracic process. Electronically Signed   By: Ozell Daring M.D.   On: 12/17/2023 15:31     Procedures   Medications Ordered in the ED  iohexol  (OMNIPAQUE ) 350 MG/ML injection 75 mL (has no  administration in time range)  albuterol  (PROVENTIL ) (2.5 MG/3ML) 0.083% nebulizer solution 5 mg (5 mg Nebulization Given 12/17/23 1546)  ipratropium (ATROVENT ) nebulizer solution 0.5 mg (0.5 mg Nebulization Given 12/17/23 1547)  predniSONE  (DELTASONE ) tablet 40 mg (40 mg Oral Given 12/17/23 1710)  albuterol  (VENTOLIN  HFA) 108 (90 Base) MCG/ACT inhaler 2 puff (2 puffs Inhalation Given 12/17/23 1710)   ED Course  Patient seen and examined. History obtained directly from patient.  Reviewed recent cardiology notes, urology plans.  Labs/EKG: Ordered CBC, BMP, BNP, troponin.  COVID/flu.  EKG.  Imaging: Ordered chest x-ray.  Medications/Fluids: Ordered: Albuterol /Atrovent .  Most recent vital signs reviewed and are as follows: BP (!) 138/58 (BP Location: Left Arm)   Pulse 77   Temp 98.5 F (36.9 C) (Oral)   Resp 16   SpO2 100%   Initial impression: Shortness of breath, cough.  Possible respiratory infection versus COPD exacerbation.  Patient did not desaturate while moving around in bed, exerted a bit trying to pull his sweat pants down to show me R groin area. No increased work of breathing.   4:59 PM Reassessment performed. Patient appears stable.  Labs personally reviewed and interpreted including: CBC shows normal white blood cell count, minimal anemia with hemoglobin 11.9; BMP unremarkable; BNP elevated at 2500, troponin elevated at 42.  COVID panel was  negative.  Imaging personally visualized and interpreted including: Chest x-ray, agree hyperinflation, no infiltrate.  Reviewed pertinent lab work and imaging with patient at bedside. Questions answered.   Most current vital signs reviewed and are as follows: BP (!) 161/78   Pulse 88   Temp 98.5 F (36.9 C) (Oral)   Resp 15   SpO2 100%   Plan: Unknown why patient's BNP is still elevated without history of heart failure, elevated troponin.  Given shortness of breath, cancer history, recommended CT scan for PE.  When they went to get the patient, patient asked that his IV be removed, and stated he did not want any more testing.    RN had removed IV on patient demand prior to [facility] discussion.   I had a long discussion with patient and son at bedside.  I discussed the lab work and my concerns and that I recommended we do additional testing to rule out pulmonary embolism as a cause of his symptoms and these abnormal lab work.  Patient at this point would just like treated for COPD exacerbation and discharged.  We discussed that heart attack, PE could be deadly.  Patient states that he understands.  I invited him to return with any worsening symptoms occluding worsening shortness of breath, chest pain, passing out, or if he changes his mind about getting additional testing.  Encouraged outpatient follow-up with his doctors as soon as possible.  Son at bedside also participated in discussion and will help monitor the patient.                                   Medical Decision Making Amount and/or Complexity of Data Reviewed Labs: ordered. Radiology: ordered.  Risk Prescription drug management.   Patient here with shortness of breath,  with actually fairly reassuring vital signs.  He does have a cough.  Chest x-ray without infiltrate, hyperinflated.  Troponin was elevated at 40.  Patient refused additional testing.  He denies significant chest pain at this time.  EKG without definite  ischemia, nonspecific ST/T changes.  BNP also elevated, however  he had recent echo without signs of heart failure.  Given this new shortness of breath, and ongoing cancer, felt that it would be prudent to rule out PE and perform serial troponin to determine if this was elevating.  Patient decided that he did not want any more testing done.  He demanded that his IV be removed.  I discussed risks and benefits at bedside with patient and son.  Patient states that he just wants medicine and he wants to go home.  We had a long discussion about dangers of heart attack or pulmonary embolism.  Patient is aware that these could lead to death.  He still wants to go home.  AMA discussion as above.  Clinically COPD exacerbation suspected, so I will send medications to treat this.     Final diagnoses:  Shortness of breath  COPD exacerbation (HCC)  Elevated troponin  Elevated brain natriuretic peptide (BNP) level    ED Discharge Orders          Ordered    predniSONE  (DELTASONE ) 20 MG tablet  Daily        12/17/23 1707    doxycycline  (VIBRAMYCIN ) 100 MG capsule  2 times daily        12/17/23 1707               Desiderio Chew, PA-C 12/17/23 1717    Charlyn Sora, MD 12/17/23 [id number]

## 2023-12-17 NOTE — ED Triage Notes (Signed)
 BIBA for nasal congestion, increasing SHOB with exertion for 2 days. 95% r/a 150/70 bp 80 hr 106 cbg

## 2023-12-28 ENCOUNTER — Ambulatory Visit (HOSPITAL_COMMUNITY)

## 2024-01-05 ENCOUNTER — Encounter: Payer: Self-pay | Admitting: Vascular Surgery

## 2024-01-05 ENCOUNTER — Ambulatory Visit: Attending: Vascular Surgery | Admitting: Vascular Surgery

## 2024-01-05 VITALS — BP 135/72 | HR 70 | Ht 69.0 in | Wt 131.0 lb

## 2024-01-05 DIAGNOSIS — I63231 Cerebral infarction due to unspecified occlusion or stenosis of right carotid arteries: Secondary | ICD-10-CM

## 2024-01-05 DIAGNOSIS — Z9889 Other specified postprocedural states: Secondary | ICD-10-CM | POA: Diagnosis not present

## 2024-01-05 NOTE — Progress Notes (Signed)
 Patient ID: Roy Moore, male   DOB: 1955-04-29, 68 y.o.   MRN: [id number]  Reason for Consult: New Patient (Initial Visit)   Referred by Ren Ny, Joelle DEL*  Subjective:     HPI:  Roy Moore is a 68 y.o. male history of right common femoral to above-knee popliteal artery bypass graft with vein for right lower extremity rest pain performed 7 years ago.  Patient now with prostate cancer has been holding Plavix  does not take other antiplatelet medications.  He is a long-term smoker.  He does have a history of symptomatic carotid stenosis with right carotid artery occlusion this has not been followed in several years.  His mother does have coronary artery disease he states that he does not have coronary artery disease himself and there is no personal family history of aneurysm disease.  He walks with the help of a cane secondary to left knee pain.  He does not have any right lower extremity symptoms.  Past Medical History:  Diagnosis Date   Anxiety    Arthritis    all joints   Atherosclerosis of native artery of right lower extremity with intermittent claudication 05/2016   (01-30-202 pt stated pain in leg/ feet when walking long time r>l) previouly followed by vascular--- dr serene (lov note in epic 11-23-2016, not followed since);   06-09-2016 s/p right SFA endovascular stenting for occlusion;  reoccluded 07-24-2016 s/p right fem-pop above knee artery bypass graft w/ ipsilateral non-reversed GSV,  at last visit ABI w/ elevated velocities at the distal anastomosis   Benign localized prostatic hyperplasia with lower urinary tract symptoms (LUTS)    Bladder stones    Carotid stenosis, symptomatic, with infarction [facility])    history CVA 08/ 2009  d/ t acute right carotid ICA occlusion   CKD (chronic kidney disease), stage III (HCC)    Depression    Elevated PSA    Environmental and seasonal allergies    H/O urinary retention 12/2022   History of cerebrovascular accident  (CVA) with residual deficit 09/15/2007   (02-25-2023 pt stated only residual Left arm weaker that right but not profound) admission in epic;   right MCA watershed infarcts w/ remote left PCA watershed infarcts;  treated w/ TPA in ED ,  suspected due to acute right occluded ICA and diagnosis w/ bilateral occlusion ICA @ the skull base with bilateral collateral reconstitution   History of kidney stones    Hyperlipidemia    Hypertension    Neuropathy    bilateral foot intermitent numbness/ pain   PAD (peripheral artery disease)    Smokers' cough (HCC)    pt stated is productive in morning w/ clear phlegm   Stroke (cerebrum) (HCC)    Wears glasses    Family History  Problem Relation Age of Onset   Colon cancer Neg Hx    Rectal cancer Neg Hx    Stomach cancer Neg Hx    Esophageal cancer Neg Hx    Past Surgical History:  Procedure Laterality Date   ABDOMINAL AORTOGRAM W/LOWER EXTREMITY [name] 06/09/2016   Procedure: Abdominal Aortogram w/Lower Extremity;  Surgeon: Serene Gaile ORN, MD;  Location: [facility];  Service: Cardiovascular;  Laterality: [name];   ANTERIOR CERVICAL DECOMP/DISCECTOMY FUSION  10/23/2003   @[facility]  by dr h. pool;   C3---C7   COLONOSCOPY WITH PROPOFOL   12/10/2016   dr stark   CYSTOSCOPY WITH LITHOLAPAXY [name] 02/26/2023   Procedure: CYSTOSCOPY WITH REMOVAL OF BLADDER STONES;  Surgeon:  Watt Rush, MD;  Location: [facility];  Service: Urology;  Laterality: [name];   CYSTOSCOPY/URETEROSCOPY/HOLMIUM LASER/STENT PLACEMENT Left 04/08/2001   @[facility]  by dr matilda:   FEMORAL-POPLITEAL BYPASS GRAFT Right 07/24/2016   Procedure: RIGHT FEMORAL TO ABOVE KNEE POPLITEAL ARTERY BYPASS GRAFT;  Surgeon: Serene Gaile ORN, MD;  Location: [facility];  Service: Vascular;  Laterality: Right;   KNEE ARTHROSCOPY Left 1990   LUMBAR LAMINECTOMY/DECOMPRESSION MICRODISCECTOMY  08/04/1999   @[facility]  by dr h. pool;   L3--4   LUMBAR LAMINECTOMY/DECOMPRESSION MICRODISCECTOMY Bilateral  12/03/2015   Procedure: Microdiscectomy - bilateral - Lumbar four-lumbar five;  Surgeon: Victory Gunnels, MD;  Location: [facility];  Service: Neurosurgery;  Laterality: Bilateral;   MANDIBLE FRACTURE SURGERY     yrs ago w/ wires;    then  laser hardware removal   PERIPHERAL VASCULAR INTERVENTION Right 06/09/2016   Procedure: Peripheral Vascular Intervention;  Surgeon: Serene Gaile ORN, MD;  Location: [facility];  Service: Cardiovascular;  Laterality: Right;  SFA   PROSTATE BIOPSY [name] 02/26/2023   Procedure: BIOPSY TRANSRECTAL ULTRASONIC PROSTATE (TUBP);  Surgeon: Watt Rush, MD;  Location[facility];  Service: Urology;  Laterality: [name];   REPAIR EXTENSOR TENDON Left 10/12/2018   Procedure: TENDON SHEATH RELEASE/TENOLYSIS;  Surgeon: Jerri Kay HERO, MD;  Location: [facility];  Service: Orthopedics;  Laterality: Left;   TRANSURETHRAL INCISION OF PROSTATE [name] 02/26/2023   Procedure: TRANSURETHRAL INCISION OF THE PROSTATE (TUIP);  Surgeon: Watt Rush, MD;  Location: [facility];  Service: Urology;  Laterality: [name];    Short Social History:  Social History   Tobacco Use   Smoking status: Every Day    Current packs/day: 0.50    Average packs/day: 0.5 packs/day for 47.0 years (23.5 ttl pk-yrs)    Types: Cigarettes   Smokeless tobacco: Never   Tobacco comments:    02-25-2023  smokes 1/2-- 3/4 ppd,   started age 19 (31)   52yrs  Substance Use Topics   Alcohol use: Yes    Alcohol/week: 3.0 standard drinks of alcohol    Types: 3 Cans of beer per week    Comment: weekly    Allergies  Allergen Reactions   Elavil  [Amitriptyline ] Other (See Comments)    Pt tried to use for sleep, medication had opposite effect.    Current Outpatient Medications  Medication Sig Dispense Refill   amLODipine  (NORVASC ) 10 MG tablet Take 1 tablet (10 mg total) by mouth daily. (Patient taking differently: Take 10 mg by mouth daily.) 30 tablet 1   aspirin  EC 81 MG  tablet Take 1 tablet (81 mg total) by mouth daily. Swallow whole.     atorvastatin  (LIPITOR) 20 MG tablet Take 1 tablet (20 mg total) by mouth daily. (Patient taking differently: Take 20 mg by mouth daily.) 90 tablet 0   ciprofloxacin  (CIPRO ) 500 MG tablet Take 1 tablet (500 mg total) by mouth 2 (two) times daily. 14 tablet 0   doxycycline  (VIBRAMYCIN ) 100 MG capsule Take 1 capsule (100 mg total) by mouth 2 (two) times daily. 14 capsule 0   escitalopram (LEXAPRO) 10 MG tablet Take 10 mg by mouth at bedtime.     loratadine  (CLARITIN ) 10 MG tablet Take 1 tablet (10 mg total) by mouth daily. (Patient taking differently: Take 10 mg by mouth daily as needed for allergies.) 100 tablet 11   predniSONE  (DELTASONE ) 20 MG tablet Take 2 tablets (40 mg total) by mouth daily. 8 tablet 0  rivaroxaban  (XARELTO ) 2.5 MG TABS tablet Take 1 tablet (2.5 mg total) by mouth 2 (two) times daily. 180 tablet 0   tamsulosin  (FLOMAX ) 0.4 MG CAPS capsule Take 1 capsule (0.4 mg total) by mouth daily. 30 capsule 11   No current facility-administered medications for this visit.    Review of Systems  Constitutional:  Constitutional negative. HENT: HENT negative.  Eyes: Eyes negative.  Cardiovascular: Cardiovascular negative.  GI: Gastrointestinal negative.  Musculoskeletal: Positive for gait problem and leg pain.  Hematologic: Hematologic/lymphatic negative.  Psychiatric: Psychiatric negative.        Objective:  Objective   Vitals:   01/05/24 1505  BP: 135/72  Pulse: 70  SpO2: 98%  Weight: 131 lb (59.4 kg)  Height: 5' 9 (1.753 m)   Body mass index is 19.35 kg/m.  Physical Exam Neck:     Vascular: No carotid bruit.  Cardiovascular:     Rate and Rhythm: Normal rate.     Pulses:          Femoral pulses are 2+ on the right side and 2+ on the left side.      Popliteal pulses are 2+ on the right side and 0 on the left side.       Dorsalis pedis pulses are 2+ on the right side and 0 on the left side.        Posterior tibial pulses are 0 on the left side.  Abdominal:     General: Abdomen is flat.     Palpations: Abdomen is soft. There is no mass.  Musculoskeletal:        General: Normal range of motion.     Right lower leg: No edema.     Left lower leg: No edema.  Skin:    General: Skin is warm.     Capillary Refill: Capillary refill takes less than 2 seconds.  Neurological:     General: No focal deficit present.     Mental Status: He is alert.  Psychiatric:        Mood and Affect: Mood normal.     Data: ABI Findings:  +---------+------------------+-----+-----------+--------+  Right   Rt Pressure (mmHg)IndexWaveform   Comment   +---------+------------------+-----+-----------+--------+  Brachial 140                                         +---------+------------------+-----+-----------+--------+  PTA     134               0.96 multiphasic          +---------+------------------+-----+-----------+--------+  DP      107               0.76 biphasic             +---------+------------------+-----+-----------+--------+  Great Toe62                0.44 Normal               +---------+------------------+-----+-----------+--------+   +---------+------------------+-----+-------------------+-------+  Left    Lt Pressure (mmHg)IndexWaveform           Comment  +---------+------------------+-----+-------------------+-------+  Brachial 139                                                +---------+------------------+-----+-------------------+-------+  PTA     49                0.35 dampened monophasic         +---------+------------------+-----+-------------------+-------+  DP      71                0.51 monophasic                  +---------+------------------+-----+-------------------+-------+  Great Toe0                 0.00 Absent                      +---------+------------------+-----+-------------------+-------+    +-------+-----------+-----------+------------+------------+  ABI/TBIToday's ABIToday's TBIPrevious ABIPrevious TBI  +-------+-----------+-----------+------------+------------+  Right 0.96       0.44                                 +-------+-----------+-----------+------------+------------+  Left  0.51       0                                    +-------+-----------+-----------+------------+------------+       Summary:  Right: Resting right ankle-brachial index is within normal range. The  right toe-brachial index is abnormal.  Right toe pressure is >60 mmHg which suggests adequate perfusion for  healing.  Left: Resting left ankle-brachial index indicates moderate left lower  extremity arterial disease. The left toe-brachial index is abnormal.   RIGHT      PSV cm/sRatioStenosisWaveform Comments  +-----------+--------+-----+--------+---------+--------+  CFA Distal 178                  triphasic          +-----------+--------+-----+--------+---------+--------+  DFA       138                  triphasic          +-----------+--------+-----+--------+---------+--------+  SFA Prox                occluded                   +-----------+--------+-----+--------+---------+--------+  SFA Mid                 occluded                   +-----------+--------+-----+--------+---------+--------+  SFA Distal              occluded                   +-----------+--------+-----+--------+---------+--------+  POP Prox   130                  triphasic          +-----------+--------+-----+--------+---------+--------+  POP Distal 99                   triphasic          +-----------+--------+-----+--------+---------+--------+  ATA Distal 66                   biphasic           +-----------+--------+-----+--------+---------+--------+  PTA Distal 52                   triphasic           +-----------+--------+-----+--------+---------+--------+  PERO Distal47                   biphasic           +-----------+--------+-----+--------+---------+--------+       Right Graft #1: CFA-PopA  +------------------+--------+---------------+---------+--------+                   PSV cm/sStenosis       Waveform Comments  +------------------+--------+---------------+---------+--------+  Inflow           99                     triphasic          +------------------+--------+---------------+---------+--------+  Prox Anastomosis  60                     biphasic           +------------------+--------+---------------+---------+--------+  Proximal Graft    66                     biphasic           +------------------+--------+---------------+---------+--------+  Mid Graft         71                     biphasic           +------------------+--------+---------------+---------+--------+  Distal Graft      62                     biphasic           +------------------+--------+---------------+---------+--------+  Distal Anastomosis92                     biphasic           +------------------+--------+---------------+---------+--------+  Outflow          208     50-74% stenosisbiphasic           +------------------+--------+---------------+---------+--------+        +-----------+--------+-----+---------------+----------+--------+  LEFT      PSV cm/sRatioStenosis       Waveform  Comments  +-----------+--------+-----+---------------+----------+--------+  CFA Mid    226                         biphasic            +-----------+--------+-----+---------------+----------+--------+  CFA Distal 176          30-49% stenosisbiphasic            +-----------+--------+-----+---------------+----------+--------+  DFA       167                         triphasic            +-----------+--------+-----+---------------+----------+--------+  SFA Prox   146                         biphasic            +-----------+--------+-----+---------------+----------+--------+  SFA Mid    392          50-74% stenosismonophasic          +-----------+--------+-----+---------------+----------+--------+  SFA Distal 123                         monophasic          +-----------+--------+-----+---------------+----------+--------+  POP  Prox   121                         monophasic          +-----------+--------+-----+---------------+----------+--------+  POP Distal 45                          monophasic          +-----------+--------+-----+---------------+----------+--------+  ATA Distal 37                          monophasic          +-----------+--------+-----+---------------+----------+--------+  PTA Distal 52                          monophasic          +-----------+--------+-----+---------------+----------+--------+  PERO Distal23                          monophasic          +-----------+--------+-----+---------------+----------+--------+   A focal velocity elevation of 392 cm/s was obtained at SFA Mid with a VR  of 3.4.       Summary:  Right: Total occlusion noted in the superficial femoral artery. CFA-PopA  graft seen to be patent with elevated flow post graft.   Left: 30-49% stenosis noted in the common femoral artery. 50-74% stenosis  noted in the superficial femoral artery.       Assessment/Plan:    68 year old male with history of right lower extremity bypass with high-grade stenosis of the left SFA but remains asymptomatic.  Also has known right carotid artery occlusion which has not been followed.  He is planned for prostate surgery in the near future and is okay for Plavix  to be held.  He does need smoking cessation.  Will plan to follow him up in 6 months with carotid duplex as well as repeat right lower  extremity bypass graft duplex.     Penne Lonni Colorado MD [facility]

## 2024-01-06 ENCOUNTER — Other Ambulatory Visit: Payer: Self-pay | Admitting: *Deleted

## 2024-01-06 DIAGNOSIS — I63231 Cerebral infarction due to unspecified occlusion or stenosis of right carotid arteries: Secondary | ICD-10-CM

## 2024-01-06 DIAGNOSIS — Z9889 Other specified postprocedural states: Secondary | ICD-10-CM

## 2024-01-10 ENCOUNTER — Ambulatory Visit: Admitting: Surgery

## 2024-01-11 NOTE — Progress Notes (Signed)
 RN left message with Dr. Maynard surgery scheduler to follow up on status of fiducial marker's and spaceOAR gel placement.

## 2024-01-13 NOTE — Progress Notes (Addendum)
" °   °  °  Cardiology Office Note Date:  01/14/2024  ID:  Roy Moore, DOB April 16, 1955, MRN [id number] PCP:  Pcp, No  Cardiologist:   Joelle VEAR Ren Donley, MD  Chief Complaint  Patient presents with   peripheral arterial disease     Problems PAD (following with Vascular surgery) R SFA stent occluded 05/2016 R fem-popliteal bypass 06/2016 ABI 10/25: Right TBI abnormal, Left ABI suggests moderate disease CAD MPI 06/2016- Large size, moderate severity fixed (SDS 1) inferior and inferoseptal perfusion defect, likely scar or possibly artifact. LVEF 51% with inferoseptal hypokinesis. TTE 11/25: 55-60% CVA (right carotid stenosis) Tobacco use 40-pack year Prostate adenocarcinoma M: AE10, ASA81, AN20, RN2.5 L: LDL 104,  Visits  09/2023: TTE, LP, A1C, ABI, d/c plavix , aspirin , low dose riva, follow up in 3 months 12/25: AAA screening, refill medications   Discussed the use of AI scribe software for clinical note transcription with the patient, who gave verbal consent to proceed.  History of Present Illness   Roy Moore is a 68 year old male presenting for follow up.  He reports difficulty obtaining his prescribed cardiovascular medications. He never picked up his Xarelto  due to lack of pharmacy supply and has not been taking aspirin . He has been out of Lipitor for some time. He does not check his BP at home but has a blood pressure cuff.  About a month ago he had shortness of breath and mild chest pain and went to [facility]. He was treated there for pneumonia and his symptoms resolved. He denies current chest pain or shortness of breath. He has been having some claudication but recently established care with vascular surgery.      ROS: Otherwise negative  Physical Exam VS:  BP (!) 155/68   Pulse 76   Ht 5' 9 (1.753 m)   Wt 131 lb 11.2 oz (59.7 kg)   SpO2 95%   BMI 19.45 kg/m  , BMI Body mass index is 19.45 kg/m. GEN: Well nourished, well developed, in no acute  distress HEENT: normal Neck: no JVD, carotid bruits, or masses Cardiac: RRR; no murmurs, rubs, or gallops,no edema  Respiratory:  CTAB bilaterally, normal work of breathing GI: soft, nontender, nondistended, + BS Extremities: No LE edema Skin: warm and dry, no rash Neuro:  Strength and sensation are intact  Recent Labs: Reviewed  ASSESSMENT AND PLAN Roy Moore is a 68 y.o. male who presents for follow up.     Peripheral artery disease Chronic condition with leg pain on ambulation. Previous aortic surgery in 2018.  - Discontinued Plavix . - Prescribed aspirin  and low dose rivaroxaban .  Hypertension Elevated blood pressure at 155 mmHg. Smoking and previous vascular blockages increase cardiovascular risk. - Refilled Ramipril  for blood pressure management. - Advised home blood pressure monitoring a couple of times a week, especially in the morning.  Hyperlipidemia Lipitor prescription not filled for an extended period. Importance of lipid management emphasized to reduce cardiovascular risk. - Refilled Lipitor.  Tobacco use disorder Smoking increases risk for further vascular blockages and cardiovascular events. - Ordered abdominal aortic ultrasound to assess for aneurysm.    Signed, Joelle VEAR Ren Donley, MD  01/14/2024 3:54 PM    [facility] "

## 2024-01-14 ENCOUNTER — Ambulatory Visit

## 2024-01-14 ENCOUNTER — Other Ambulatory Visit (HOSPITAL_COMMUNITY): Payer: Self-pay

## 2024-01-14 VITALS — BP 155/68 | HR 76 | Ht 69.0 in | Wt 131.7 lb

## 2024-01-14 DIAGNOSIS — F17200 Nicotine dependence, unspecified, uncomplicated: Secondary | ICD-10-CM | POA: Diagnosis not present

## 2024-01-14 DIAGNOSIS — I10 Essential (primary) hypertension: Secondary | ICD-10-CM | POA: Diagnosis not present

## 2024-01-14 DIAGNOSIS — I739 Peripheral vascular disease, unspecified: Secondary | ICD-10-CM

## 2024-01-14 DIAGNOSIS — I69359 Hemiplegia and hemiparesis following cerebral infarction affecting unspecified side: Secondary | ICD-10-CM

## 2024-01-14 DIAGNOSIS — E785 Hyperlipidemia, unspecified: Secondary | ICD-10-CM

## 2024-01-14 DIAGNOSIS — C61 Malignant neoplasm of prostate: Secondary | ICD-10-CM | POA: Diagnosis not present

## 2024-01-14 MED ORDER — ATORVASTATIN CALCIUM 20 MG PO TABS
20.0000 mg | ORAL_TABLET | Freq: Every day | ORAL | 3 refills | Status: AC
  Filled 2024-01-14 (×3): qty 90, 90d supply, fill #0

## 2024-01-14 MED ORDER — ASPIRIN 81 MG PO TBEC
81.0000 mg | DELAYED_RELEASE_TABLET | Freq: Every day | ORAL | 3 refills | Status: AC
  Filled 2024-01-14: qty 90, 90d supply, fill #0

## 2024-01-14 MED ORDER — RIVAROXABAN 2.5 MG PO TABS: 2.5000 mg | ORAL_TABLET | Freq: Two times a day (BID) | ORAL | 3 refills | Status: DC

## 2024-01-14 MED ORDER — ATORVASTATIN CALCIUM 20 MG PO TABS: 20.0000 mg | ORAL_TABLET | Freq: Every day | ORAL | 3 refills | Status: DC

## 2024-01-14 MED ORDER — RAMIPRIL 5 MG PO CAPS: 5.0000 mg | ORAL_CAPSULE | Freq: Every day | ORAL | 3 refills | Status: DC

## 2024-01-14 MED ORDER — ASPIRIN 81 MG PO TBEC: 81.0000 mg | DELAYED_RELEASE_TABLET | Freq: Every day | ORAL | 3 refills | Status: DC

## 2024-01-14 MED ORDER — RAMIPRIL 5 MG PO CAPS
5.0000 mg | ORAL_CAPSULE | Freq: Every day | ORAL | 3 refills | Status: AC
  Filled 2024-01-14 (×3): qty 90, 90d supply, fill #0

## 2024-01-14 MED ORDER — RIVAROXABAN 2.5 MG PO TABS
2.5000 mg | ORAL_TABLET | Freq: Two times a day (BID) | ORAL | 3 refills | Status: AC
  Filled 2024-01-14 (×2): qty 180, 90d supply, fill #0

## 2024-01-14 NOTE — Patient Instructions (Signed)
 Medication Instructions:  RESTART Aspirin  81 mg. Take one (1) tablet by mouth once daily.  RESTART Atorvastatin  (Lipitor) 20 mg. Take one (1) tablet by mouth once daily.  RESTART Ramipril  (Altace ) 5 mg. Take one (1) tablet by mouth once daily. *If you need a refill on your cardiac medications before your next appointment, please call your pharmacy*  Lab Work: None ordered If you have labs (blood work) drawn today and your tests are completely normal, you will receive your results only by: MyChart Message (if you have MyChart) OR A paper copy in the mail If you have any lab test that is abnormal or we need to change your treatment, we will call you to review the results.  Testing/Procedures: Abdominal Aorta Ultrasound  Follow-Up: At [facility], you and your health needs are our priority.  As part of our continuing mission to provide you with exceptional heart care, our providers are all part of one team.  This team includes your primary Cardiologist (physician) and Advanced Practice Providers or APPs (Physician Assistants and Nurse Practitioners) who all work together to provide you with the care you need, when you need it.  Your next appointment:   6 month(s)  Provider:   Joelle VEAR Ren Donley, MD    We recommend signing up for the patient portal called MyChart.  Sign up information is provided on this After Visit Summary.  MyChart is used to connect with patients for Virtual Visits (Telemedicine).  Patients are able to view lab/test results, encounter notes, upcoming appointments, etc.  Non-urgent messages can be sent to your provider as well.   To learn more about what you can do with MyChart, go to [email or web address].   Other Instructions Your physician has requested that you have an abdominal aorta duplex. During this test, an ultrasound is used to evaluate the aorta. Allow 30 minutes for this exam. Do not eat after midnight the day before and avoid carbonated  beverages.  Please note: We ask at that you not bring children with you during ultrasound (echo/ vascular) testing. Due to room size and safety concerns, children are not allowed in the ultrasound rooms during exams. Our front office staff cannot provide observation of children in our lobby area while testing is being conducted. An adult accompanying a patient to their appointment will only be allowed in the ultrasound room at the discretion of the ultrasound technician under special circumstances. We apologize for any inconvenience.

## 2024-01-15 ENCOUNTER — Other Ambulatory Visit (HOSPITAL_COMMUNITY): Payer: Self-pay

## 2024-01-25 NOTE — Progress Notes (Signed)
 Rhonda from [facility] did call back to update that they are still awaiting cardiac clearance for patient to scheduled fiducial marker's and spaceOAR.   Patient is currently under active workup with cardiology and will have an abdominal aorta ultrasound on 02/21/24.  Will continue to follow.

## 2024-02-11 ENCOUNTER — Telehealth: Payer: Self-pay

## 2024-02-11 NOTE — Telephone Encounter (Signed)
" ° °  Pre-operative Risk Assessment    Patient Name: Roy Moore  DOB: [birth year] MRN: [id number]      Request for Surgical Clearance    Procedure:  Fiducial Markers Insertion of Space OAR   Date of Surgery:  Clearance TBD                                 Surgeon:  Dr. Norleen Seltzer  Surgeon's Group or Practice Name:  [facility]  Phone number:  [phone] Fax number:  [phone]   Type of Clearance Requested:   - Medical  - Pharmacy:  Hold TBD   TBD   Type of Anesthesia:  MAC   Additional requests/questions:  Please fax a copy of Clearance  to the surgeon's office.  Signed, Chantal CHRISTELLA Penton   02/11/2024, 9:13 AM   "

## 2024-02-11 NOTE — Telephone Encounter (Signed)
 Thank you so much! We appreciate it. If there is anything else the pre-op team can do to help though, just let us  know.  Best, Geraldyne Barraclough

## 2024-02-11 NOTE — Telephone Encounter (Signed)
 Hi Dr. Ren, you recently saw this patient in clinic on 01/14/2024. Are you able to comment on surgical clearance for upcoming Fiducial Markers Insertion of Space OAR  (date to TBD)? It looks like he is also on Aspirin  and low dose Xarelto  for PAD. Is it okay to hold these as well?    Please route your response to P CV DIV PREOP.   Thanks so much! Navaeh Kehres

## 2024-02-21 ENCOUNTER — Ambulatory Visit (HOSPITAL_COMMUNITY)

## 2024-03-02 ENCOUNTER — Ambulatory Visit (HOSPITAL_COMMUNITY): Admission: RE | Admit: 2024-03-02 | Discharge: 2024-03-02

## 2024-03-02 DIAGNOSIS — E785 Hyperlipidemia, unspecified: Secondary | ICD-10-CM

## 2024-03-02 DIAGNOSIS — I69359 Hemiplegia and hemiparesis following cerebral infarction affecting unspecified side: Secondary | ICD-10-CM

## 2024-03-02 DIAGNOSIS — F17200 Nicotine dependence, unspecified, uncomplicated: Secondary | ICD-10-CM

## 2024-03-02 DIAGNOSIS — I739 Peripheral vascular disease, unspecified: Secondary | ICD-10-CM

## 2024-03-02 DIAGNOSIS — C61 Malignant neoplasm of prostate: Secondary | ICD-10-CM

## 2024-03-02 DIAGNOSIS — I10 Essential (primary) hypertension: Secondary | ICD-10-CM

## 2024-03-03 ENCOUNTER — Ambulatory Visit: Payer: Self-pay

## 2024-07-05 ENCOUNTER — Ambulatory Visit (HOSPITAL_COMMUNITY)

## 2024-07-05 ENCOUNTER — Ambulatory Visit
